# Patient Record
Sex: Female | Born: 1935 | Race: Black or African American | Hispanic: No | Smoking: Never smoker
Health system: Southern US, Community
[De-identification: ages and names within clinical notes are randomized; demographics above are authoritative.]

## PROBLEM LIST (undated history)

## (undated) DIAGNOSIS — M810 Age-related osteoporosis without current pathological fracture: Secondary | ICD-10-CM

## (undated) DIAGNOSIS — M1990 Unspecified osteoarthritis, unspecified site: Secondary | ICD-10-CM

## (undated) DIAGNOSIS — N1831 Chronic kidney disease, stage 3a: Secondary | ICD-10-CM

## (undated) DIAGNOSIS — I2510 Atherosclerotic heart disease of native coronary artery without angina pectoris: Secondary | ICD-10-CM

## (undated) DIAGNOSIS — I071 Rheumatic tricuspid insufficiency: Secondary | ICD-10-CM

## (undated) DIAGNOSIS — H269 Unspecified cataract: Secondary | ICD-10-CM

## (undated) DIAGNOSIS — I272 Pulmonary hypertension, unspecified: Secondary | ICD-10-CM

## (undated) DIAGNOSIS — J189 Pneumonia, unspecified organism: Secondary | ICD-10-CM

## (undated) DIAGNOSIS — K219 Gastro-esophageal reflux disease without esophagitis: Secondary | ICD-10-CM

## (undated) DIAGNOSIS — J309 Allergic rhinitis, unspecified: Secondary | ICD-10-CM

## (undated) DIAGNOSIS — I350 Nonrheumatic aortic (valve) stenosis: Secondary | ICD-10-CM

## (undated) DIAGNOSIS — R0600 Dyspnea, unspecified: Secondary | ICD-10-CM

## (undated) DIAGNOSIS — R7303 Prediabetes: Secondary | ICD-10-CM

## (undated) DIAGNOSIS — I340 Nonrheumatic mitral (valve) insufficiency: Secondary | ICD-10-CM

## (undated) DIAGNOSIS — I739 Peripheral vascular disease, unspecified: Secondary | ICD-10-CM

## (undated) DIAGNOSIS — I77810 Thoracic aortic ectasia: Secondary | ICD-10-CM

## (undated) DIAGNOSIS — E7800 Pure hypercholesterolemia, unspecified: Secondary | ICD-10-CM

## (undated) DIAGNOSIS — I70213 Atherosclerosis of native arteries of extremities with intermittent claudication, bilateral legs: Secondary | ICD-10-CM

## (undated) DIAGNOSIS — J449 Chronic obstructive pulmonary disease, unspecified: Secondary | ICD-10-CM

## (undated) DIAGNOSIS — M169 Osteoarthritis of hip, unspecified: Secondary | ICD-10-CM

## (undated) DIAGNOSIS — I10 Essential (primary) hypertension: Secondary | ICD-10-CM

## (undated) DIAGNOSIS — D573 Sickle-cell trait: Secondary | ICD-10-CM

## (undated) DIAGNOSIS — D571 Sickle-cell disease without crisis: Secondary | ICD-10-CM

## (undated) DIAGNOSIS — E119 Type 2 diabetes mellitus without complications: Secondary | ICD-10-CM

## (undated) DIAGNOSIS — E1121 Type 2 diabetes mellitus with diabetic nephropathy: Secondary | ICD-10-CM

## (undated) DIAGNOSIS — R519 Headache, unspecified: Secondary | ICD-10-CM

## (undated) DIAGNOSIS — J45909 Unspecified asthma, uncomplicated: Secondary | ICD-10-CM

## (undated) DIAGNOSIS — H35039 Hypertensive retinopathy, unspecified eye: Secondary | ICD-10-CM

## (undated) HISTORY — DX: Age-related osteoporosis without current pathological fracture: M81.0

## (undated) HISTORY — DX: Pure hypercholesterolemia, unspecified: E78.00

## (undated) HISTORY — PX: CATARACT EXTRACTION: SUR2

## (undated) HISTORY — DX: Atherosclerotic heart disease of native coronary artery without angina pectoris: I25.10

## (undated) HISTORY — DX: Type 2 diabetes mellitus without complications: E11.9

## (undated) HISTORY — DX: Chronic kidney disease, stage 3a: N18.31

## (undated) HISTORY — DX: Hypertensive retinopathy, unspecified eye: H35.039

## (undated) HISTORY — DX: Unspecified asthma, uncomplicated: J45.909

## (undated) HISTORY — DX: Nonrheumatic mitral (valve) insufficiency: I34.0

## (undated) HISTORY — DX: Pulmonary hypertension, unspecified: I27.20

## (undated) HISTORY — DX: Atherosclerosis of native arteries of extremities with intermittent claudication, bilateral legs: I70.213

## (undated) HISTORY — DX: Type 2 diabetes mellitus with diabetic nephropathy: E11.21

## (undated) HISTORY — PX: ELBOW SURGERY: SHX618

## (undated) HISTORY — DX: Essential (primary) hypertension: I10

## (undated) HISTORY — DX: Unspecified osteoarthritis, unspecified site: M19.90

## (undated) HISTORY — PX: TUBAL LIGATION: SHX77

## (undated) HISTORY — DX: Rheumatic tricuspid insufficiency: I07.1

## (undated) HISTORY — DX: Thoracic aortic ectasia: I77.810

## (undated) HISTORY — DX: Allergic rhinitis, unspecified: J30.9

## (undated) HISTORY — DX: Peripheral vascular disease, unspecified: I73.9

## (undated) HISTORY — DX: Nonrheumatic aortic (valve) stenosis: I35.0

## (undated) HISTORY — PX: MULTIPLE TOOTH EXTRACTIONS: SHX2053

## (undated) HISTORY — DX: Sickle-cell disease without crisis: D57.1

## (undated) HISTORY — DX: Osteoarthritis of hip, unspecified: M16.9

---

## 1999-01-20 ENCOUNTER — Ambulatory Visit (HOSPITAL_COMMUNITY): Admission: RE | Admit: 1999-01-20 | Discharge: 1999-01-20 | Payer: Self-pay | Admitting: *Deleted

## 1999-09-01 ENCOUNTER — Encounter: Payer: Self-pay | Admitting: Emergency Medicine

## 1999-09-01 ENCOUNTER — Emergency Department (HOSPITAL_COMMUNITY): Admission: EM | Admit: 1999-09-01 | Discharge: 1999-09-01 | Payer: Self-pay | Admitting: Emergency Medicine

## 2000-07-19 ENCOUNTER — Encounter: Payer: Self-pay | Admitting: Emergency Medicine

## 2000-07-19 ENCOUNTER — Emergency Department (HOSPITAL_COMMUNITY): Admission: EM | Admit: 2000-07-19 | Discharge: 2000-07-19 | Payer: Self-pay | Admitting: Emergency Medicine

## 2000-12-21 HISTORY — PX: ANKLE SURGERY: SHX546

## 2001-01-13 ENCOUNTER — Other Ambulatory Visit: Admission: RE | Admit: 2001-01-13 | Discharge: 2001-01-13 | Payer: Self-pay | Admitting: Family Medicine

## 2001-02-11 ENCOUNTER — Encounter: Payer: Self-pay | Admitting: Orthopedic Surgery

## 2001-02-11 ENCOUNTER — Inpatient Hospital Stay (HOSPITAL_COMMUNITY): Admission: EM | Admit: 2001-02-11 | Discharge: 2001-02-13 | Payer: Self-pay | Admitting: Emergency Medicine

## 2001-02-11 ENCOUNTER — Encounter: Payer: Self-pay | Admitting: Emergency Medicine

## 2001-04-01 ENCOUNTER — Ambulatory Visit (HOSPITAL_COMMUNITY): Admission: RE | Admit: 2001-04-01 | Discharge: 2001-04-01 | Payer: Self-pay | Admitting: Pulmonary Disease

## 2001-04-01 ENCOUNTER — Encounter: Payer: Self-pay | Admitting: Pulmonary Disease

## 2001-09-21 ENCOUNTER — Encounter: Payer: Self-pay | Admitting: Family Medicine

## 2001-09-21 ENCOUNTER — Encounter: Admission: RE | Admit: 2001-09-21 | Discharge: 2001-09-21 | Payer: Self-pay | Admitting: Family Medicine

## 2001-11-25 ENCOUNTER — Emergency Department (HOSPITAL_COMMUNITY): Admission: EM | Admit: 2001-11-25 | Discharge: 2001-11-25 | Payer: Self-pay | Admitting: Emergency Medicine

## 2001-11-28 ENCOUNTER — Emergency Department (HOSPITAL_COMMUNITY): Admission: EM | Admit: 2001-11-28 | Discharge: 2001-11-28 | Payer: Self-pay | Admitting: *Deleted

## 2001-12-01 ENCOUNTER — Ambulatory Visit (HOSPITAL_COMMUNITY): Admission: RE | Admit: 2001-12-01 | Discharge: 2001-12-02 | Payer: Self-pay | Admitting: Orthopedic Surgery

## 2001-12-01 ENCOUNTER — Encounter: Payer: Self-pay | Admitting: Orthopedic Surgery

## 2002-09-11 ENCOUNTER — Emergency Department (HOSPITAL_COMMUNITY): Admission: EM | Admit: 2002-09-11 | Discharge: 2002-09-11 | Payer: Self-pay | Admitting: Emergency Medicine

## 2002-09-11 ENCOUNTER — Encounter: Payer: Self-pay | Admitting: Emergency Medicine

## 2002-11-24 ENCOUNTER — Encounter: Admission: RE | Admit: 2002-11-24 | Discharge: 2002-11-24 | Payer: Self-pay | Admitting: Family Medicine

## 2002-11-24 ENCOUNTER — Encounter: Payer: Self-pay | Admitting: Family Medicine

## 2004-04-23 ENCOUNTER — Encounter: Admission: RE | Admit: 2004-04-23 | Discharge: 2004-04-23 | Payer: Self-pay | Admitting: Family Medicine

## 2004-06-02 ENCOUNTER — Ambulatory Visit (HOSPITAL_COMMUNITY): Admission: RE | Admit: 2004-06-02 | Discharge: 2004-06-02 | Payer: Self-pay | Admitting: Obstetrics & Gynecology

## 2004-11-14 ENCOUNTER — Encounter: Admission: RE | Admit: 2004-11-14 | Discharge: 2004-11-14 | Payer: Self-pay | Admitting: Family Medicine

## 2005-02-17 ENCOUNTER — Other Ambulatory Visit: Admission: RE | Admit: 2005-02-17 | Discharge: 2005-02-17 | Payer: Self-pay | Admitting: Obstetrics and Gynecology

## 2005-04-10 ENCOUNTER — Ambulatory Visit: Payer: Self-pay | Admitting: Pulmonary Disease

## 2005-10-12 ENCOUNTER — Ambulatory Visit: Payer: Self-pay | Admitting: Pulmonary Disease

## 2005-10-23 ENCOUNTER — Encounter: Admission: RE | Admit: 2005-10-23 | Discharge: 2005-10-23 | Payer: Self-pay | Admitting: Family Medicine

## 2006-04-09 ENCOUNTER — Ambulatory Visit: Payer: Self-pay | Admitting: Pulmonary Disease

## 2006-08-24 ENCOUNTER — Encounter: Payer: Self-pay | Admitting: Family Medicine

## 2006-08-24 ENCOUNTER — Encounter: Admission: RE | Admit: 2006-08-24 | Discharge: 2006-08-24 | Payer: Self-pay | Admitting: Family Medicine

## 2007-08-29 ENCOUNTER — Encounter: Admission: RE | Admit: 2007-08-29 | Discharge: 2007-08-29 | Payer: Self-pay | Admitting: Family Medicine

## 2008-06-07 ENCOUNTER — Emergency Department (HOSPITAL_COMMUNITY): Admission: EM | Admit: 2008-06-07 | Discharge: 2008-06-07 | Payer: Self-pay | Admitting: Emergency Medicine

## 2008-08-29 ENCOUNTER — Encounter: Admission: RE | Admit: 2008-08-29 | Discharge: 2008-08-29 | Payer: Self-pay | Admitting: Family Medicine

## 2009-06-03 ENCOUNTER — Other Ambulatory Visit: Admission: RE | Admit: 2009-06-03 | Discharge: 2009-06-03 | Payer: Self-pay | Admitting: Family Medicine

## 2011-01-10 ENCOUNTER — Encounter: Payer: Self-pay | Admitting: Family Medicine

## 2011-05-08 NOTE — Op Note (Signed)
NAME:  Mary Chase, Mary Chase                        ACCOUNT NO.:  [id number]   MEDICAL RECORD NO.:  [id number]                   PATIENT TYPE:  AMB   LOCATION:  SDC                                  FACILITY:  [facility]   PHYSICIAN:  Genia Del, M.D.             DATE OF BIRTH:  05/03/1935   DATE OF PROCEDURE:  06/02/2004  DATE OF DISCHARGE:                                 OPERATIVE REPORT   PREOPERATIVE DIAGNOSIS:  Intrauterine lesions with pelvic pain.   POSTOPERATIVE DIAGNOSES:  1. Intrauterine lesions with pelvic pain.  2. 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  3. Mild hematometra.  4. Uterine perforation.   SURGEON:  Genia Del, M.D.   ANESTHESIOLOGIST:  Burnett Corrente, M.D.   PROCEDURES:  1. Dilatation of the cervix.  2. Hysteroscopy.  3. Diagnostic laparoscopy opened with cauterization of uterine perforation     site for hemostasis.   DESCRIPTION OF PROCEDURE:  Under general anesthesia with endotracheal  intubation, the patient is in the lithotomy position for operative  hysteroscopy. She is prepped on the suprapubic, vulvar and vaginal areas and  draped as usual.  The bladder is catheterized.  The vaginal examination  reveals an anteverted uterus, mobile, normal volume, no adnexal mass.  The  speculum is inserted in the vagina.  The anterior lip of the cervix is  grasped with the tenaculum.  A paracervical block is done with Nesacaine 1%,  a total of 20 mL, at 4 o'clock and 8 o'clock.  We then precede with  dilatation of the cervix using Hegar dilators up to #35 without difficulty.  At the beginning of dilatation, it is noted that hematometria was probably  present about 25 mL of old blood is evacuated from the intrauterine cavity.  After dilating easily to Hegar #35, we then insert the operative  hysteroscope in the intrauterine cavity and visualize the intrauterine  cavity.  Small submucosal myomas are noted.  No other lesion is seen but a  perforation is rapidly  diagnosed on the right anterior aspect of the uterine  cavity.  Bowel loops are seen through that perforation.  Pictures are taken.  The decision is taken promptly to remove all instruments vaginally and  proceed with diagnostic laparoscopy.  The patient is repositioned.  The prep  is redone with Hibiclens over the abdominal area and the suprapubic area.  The patient is redraped and a bladder catheter with a bag is put in place.  We make an infraumbilical incision with the scalpel over 2 cm.  The  aponeurosis is grasped with Allis clamps and the aponeurosis is opened with  Mayo scissors under direct vision.  The parietal peritoneum is opened as  well with Mayo scissors and a pursestring suture of 0 Vicryl is put at the  aponeurosis.  We then insert the Hasson trocar with the laparoscope at that  level.  The pneumoperitoneum is created.  The abdominal pelvic cavity is  inspected.  Hemoperitoneum is present with about 50 mL and the fluid from  the hysteroscope procedure.  We note the right anterior perforation on the  uterus which is bleeding mildly.  A 5 mm trocar is inserted on the left  iliac area.  A 5 mm incision is done with the scalpel.  The trocar is  inserted under direct vision and the probe is put in place at that level.  Inspection is done showing both tubes, status post bilateral tubal  sterilization.  Both ovaries are normal in size and appearance.  The uterus  is normal size and appearance otherwise.  No bowel trauma is seen.  The  appendix and the liver are normal to inspection and pictures are taken.  We  used the Nezhat to suction the fluid and mild hemoperitoneum.  We used the  bipolar to cauterize the uterine perforation site to complete hemostasis at  that level.  This is done adequately.  Pictures are taken of the cauterized  uterine perforation site with good hemostasis.  We irrigate and suction the  abdominal pelvic cavity.  Hemostasis is adequate.  Instruments are  removed.  The CO2 is evacuated.  The trocars are removed and the 0 Vicryl is attached  at the aponeurosis at the infraumbilical incision.  We then use 4-0 Monocryl  in a subcuticular suture to close the skin at the infraumbilical incision  and Dermabond at the left iliac incision.  Hemostasis is adequate at all  levels.  The estimated blood loss was 50 to 75 mL.  The deficit from  hysteroscopy was 600 mL but it was suctioned by laparoscopy.  No other  complications occurred and the patient was brought to the recovery room in  stable condition.                                               Genia Del, M.D.    ML/MEDQ  D:  06/02/2004  T:  06/02/2004  Job:  [id number]
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
## 2013-08-09 ENCOUNTER — Other Ambulatory Visit: Payer: Self-pay

## 2013-08-17 ENCOUNTER — Other Ambulatory Visit: Payer: Self-pay | Admitting: Family Medicine

## 2013-08-17 DIAGNOSIS — N644 Mastodynia: Secondary | ICD-10-CM

## 2013-09-06 ENCOUNTER — Ambulatory Visit
Admission: RE | Admit: 2013-09-06 | Discharge: 2013-09-06 | Disposition: A | Discharge status: Home / Self Care | Payer: Medicare Other | Source: Ambulatory Visit | Attending: Family Medicine | Admitting: Family Medicine

## 2013-09-06 ENCOUNTER — Other Ambulatory Visit: Payer: Self-pay | Admitting: Family Medicine

## 2013-09-06 DIAGNOSIS — N6310 Unspecified lump in the right breast, unspecified quadrant: Secondary | ICD-10-CM

## 2013-09-06 DIAGNOSIS — N644 Mastodynia: Secondary | ICD-10-CM

## 2013-09-08 ENCOUNTER — Ambulatory Visit
Admission: RE | Admit: 2013-09-08 | Discharge: 2013-09-08 | Disposition: A | Discharge status: Home / Self Care | Payer: Medicare Other | Source: Ambulatory Visit | Attending: Family Medicine | Admitting: Family Medicine

## 2013-09-08 DIAGNOSIS — N6310 Unspecified lump in the right breast, unspecified quadrant: Secondary | ICD-10-CM

## 2013-09-18 ENCOUNTER — Encounter (INDEPENDENT_AMBULATORY_CARE_PROVIDER_SITE_OTHER): Payer: Self-pay | Admitting: General Surgery

## 2013-09-18 ENCOUNTER — Ambulatory Visit (INDEPENDENT_AMBULATORY_CARE_PROVIDER_SITE_OTHER): Payer: Medicare Other | Admitting: General Surgery

## 2013-09-18 ENCOUNTER — Other Ambulatory Visit (INDEPENDENT_AMBULATORY_CARE_PROVIDER_SITE_OTHER): Payer: Self-pay | Admitting: General Surgery

## 2013-09-18 VITALS — BP 123/80 | HR 74 | Temp 97.1°F | Resp 16 | Ht 60.0 in | Wt 171.0 lb

## 2013-09-18 DIAGNOSIS — N63 Unspecified lump in unspecified breast: Secondary | ICD-10-CM

## 2013-09-18 DIAGNOSIS — N61 Mastitis without abscess: Secondary | ICD-10-CM

## 2013-09-18 DIAGNOSIS — N6310 Unspecified lump in the right breast, unspecified quadrant: Secondary | ICD-10-CM

## 2013-09-18 DIAGNOSIS — C50911 Malignant neoplasm of unspecified site of right female breast: Secondary | ICD-10-CM
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
## 2013-09-20 ENCOUNTER — Telehealth (INDEPENDENT_AMBULATORY_CARE_PROVIDER_SITE_OTHER): Payer: Self-pay

## 2013-09-20 NOTE — Telephone Encounter (Signed)
Called pt to notify her that we did receive the pathology report which was benign per Dr Dwain Sarna but this might be infection still. I advised pt that she needed to still keep her appt with Dr Dwain Sarna for Monday and the pt understands.

## 2013-09-25 ENCOUNTER — Telehealth (INDEPENDENT_AMBULATORY_CARE_PROVIDER_SITE_OTHER): Payer: Self-pay

## 2013-09-25 ENCOUNTER — Ambulatory Visit (INDEPENDENT_AMBULATORY_CARE_PROVIDER_SITE_OTHER): Payer: Medicare Other | Admitting: General Surgery

## 2013-09-25 ENCOUNTER — Encounter (INDEPENDENT_AMBULATORY_CARE_PROVIDER_SITE_OTHER): Payer: Self-pay | Admitting: General Surgery

## 2013-09-25 VITALS — BP 124/82 | HR 92 | Temp 97.7°F | Resp 15 | Ht 60.0 in | Wt 171.0 lb

## 2013-09-25 DIAGNOSIS — N63 Unspecified lump in unspecified breast: Secondary | ICD-10-CM

## 2013-09-25 MED ORDER — DOXYCYCLINE HYCLATE 100 MG PO TABS: 100.0000 mg | ORAL_TABLET | Freq: Two times a day (BID) | ORAL | Status: DC

## 2013-09-25 NOTE — Telephone Encounter (Signed)
Called to see the name of the last antibiotic the pt was prescribed from their office which was Doxycycline 100mg  ordered on 8/26. The pt is needing more antibiotic and the pt wanted another refill on this script but the pt couldn't remember the name so we called to find out. I will notify Dr Dwain Sarna so he can prescribe if he wishes to keep her on the same script.

## 2013-09-25 NOTE — Telephone Encounter (Signed)
Patient is on Doxy. At this time
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
## 2013-10-09 ENCOUNTER — Encounter (INDEPENDENT_AMBULATORY_CARE_PROVIDER_SITE_OTHER): Payer: Self-pay | Admitting: General Surgery

## 2013-10-09 ENCOUNTER — Ambulatory Visit (INDEPENDENT_AMBULATORY_CARE_PROVIDER_SITE_OTHER): Payer: Medicare Other | Admitting: General Surgery

## 2013-10-09 VITALS — BP 120/72 | HR 96 | Temp 98.2°F | Resp 15 | Ht 60.0 in | Wt 169.2 lb

## 2013-10-09 DIAGNOSIS — N63 Unspecified lump in unspecified breast: Secondary | ICD-10-CM
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
## 2013-10-20 ENCOUNTER — Encounter (HOSPITAL_COMMUNITY): Payer: Self-pay | Admitting: Pharmacy Technician

## 2013-10-23 NOTE — Pre-Procedure Instructions (Addendum)
Mary Chase  10/23/2013   Your procedure is scheduled on:  Wednesday, November 12th  Report to Main Entrance "A" and check in with admitting at 0630 AM.  Call this number if you have problems the morning of surgery: [phone]   Remember:   Do not eat food or drink liquids after midnight.   Take these medicines the morning of surgery with A SIP OF WATER: advair, singulair, albuterol if needed   Do not wear jewelry, make-up or nail polish.  Do not wear lotions, powders, or perfume, deodorant.  Do not shave 48 hours prior to surgery. Men may shave face and neck.  Do not bring valuables to the hospital.  [facility] is not responsible for any belongings or valuables.               Contacts, dentures or bridgework may not be worn into surgery.  Leave suitcase in the car. After surgery it may be brought to your room.  For patients admitted to the hospital, discharge time is determined by your   treatment team.               Patients discharged the day of surgery will not be allowed to drive home.    Special Instructions: Shower using CHG 2 nights before surgery and the night before surgery.  If you shower the day of surgery use CHG.  Use special wash - you have one bottle of CHG for all showers.  You should use approximately 1/3 of the bottle for each shower.   Please read over the following fact sheets that you were given: Pain Booklet, Coughing and Deep Breathing and Surgical Site Infection Prevention

## 2013-10-24 ENCOUNTER — Other Ambulatory Visit (HOSPITAL_COMMUNITY): Payer: Self-pay | Admitting: *Deleted

## 2013-10-24 ENCOUNTER — Encounter (HOSPITAL_COMMUNITY): Payer: Self-pay

## 2013-10-24 ENCOUNTER — Encounter (HOSPITAL_COMMUNITY)
Admission: RE | Admit: 2013-10-24 | Discharge: 2013-10-24 | Disposition: A | Discharge status: Home / Self Care | Payer: Medicare Other | Source: Ambulatory Visit | Attending: General Surgery | Admitting: General Surgery

## 2013-10-24 ENCOUNTER — Encounter (HOSPITAL_COMMUNITY)
Admission: RE | Admit: 2013-10-24 | Discharge: 2013-10-24 | Disposition: A | Discharge status: Home / Self Care | Payer: Medicare Other | Source: Ambulatory Visit | Attending: Anesthesiology | Admitting: Anesthesiology

## 2013-10-24 DIAGNOSIS — Z01818 Encounter for other preprocedural examination: Secondary | ICD-10-CM | POA: Insufficient documentation

## 2013-10-24 DIAGNOSIS — Z01812 Encounter for preprocedural laboratory examination: Secondary | ICD-10-CM | POA: Insufficient documentation

## 2013-10-24 DIAGNOSIS — Z01810 Encounter for preprocedural cardiovascular examination: Secondary | ICD-10-CM | POA: Insufficient documentation

## 2013-10-24 HISTORY — DX: Gastro-esophageal reflux disease without esophagitis: K21.9

## 2013-10-24 LAB — CBC WITH DIFFERENTIAL/PLATELET
Basophils Relative: 1 % (ref 0–1)
Eosinophils Absolute: 0.5 10*3/uL (ref 0.0–0.7)
Eosinophils Relative: 5 % (ref 0–5)
HCT: 35.5 % — ABNORMAL LOW (ref 36.0–46.0)
Hemoglobin: 12.4 g/dL (ref 12.0–15.0)
MCH: 31.7 pg (ref 26.0–34.0)
MCHC: 34.9 g/dL (ref 30.0–36.0)
MCV: 90.8 fL (ref 78.0–100.0)
Monocytes Absolute: 0.4 10*3/uL (ref 0.1–1.0)
Monocytes Relative: 4 % (ref 3–12)
Neutro Abs: 5.2 10*3/uL (ref 1.7–7.7)

## 2013-10-24 LAB — BASIC METABOLIC PANEL
BUN: 13 mg/dL (ref 6–23)
Chloride: 105 mEq/L (ref 96–112)
Creatinine, Ser: 1.05 mg/dL (ref 0.50–1.10)
GFR calc Af Amer: 59 mL/min — ABNORMAL LOW (ref >90)
GFR calc non Af Amer: 51 mL/min — ABNORMAL LOW (ref >90)
Glucose, Bld: 110 mg/dL — ABNORMAL HIGH (ref 70–99)
Potassium: 3.4 mEq/L — ABNORMAL LOW (ref 3.5–5.1)

## 2013-10-24 NOTE — Progress Notes (Signed)
req'd prior ekg and office notes frm pcp dr [facility]

## 2013-10-26 NOTE — Progress Notes (Signed)
Anesthesia chart review:  Patient is a 78 year old female scheduled for excision of right breast mass on 11/01/13 by Dr. Dwain Sarna.  She has a right breast mass and malignancy needs to be ruled out.  History includes non-smoker, pre-diabetic (no meds), HTN, asthma, GERD, arthritis, obesity, sickle cell trait. PCP is listed as Dr. Marinda Elk.    EKG on 10/24/13 showed NSR, septal infarct (age undetermined) with negative T waves in V1-2. The interpreting cardiologist did not feel that her EKG was significantly changed when compared to her last tracing on 05/29/04. No CV symptoms were documented at her PAT visit or at her last visit with Dr. Dwain Sarna.   CXR on 10/24/13 showed: Probable linear scarring or atelectasis at both lung bases. No definite active process. Mild cardiomegaly.   Preoperative labs noted.  Patient will be evaluated by her assigned anesthesiologist on the day of surgery.  If no new changes/CV symptoms then I would anticipate that she could proceed as planned.  Velna Ochs [facility] Short Stay Center/Anesthesiology Phone [phone] 10/26/2013 11:37 AM

## 2013-10-31 MED ORDER — VANCOMYCIN HCL IN DEXTROSE 1-5 GM/200ML-% IV SOLN
1000.0000 mg | INTRAVENOUS | Status: AC
  Administered 2013-11-01: 1000 mg via INTRAVENOUS
  Filled 2013-10-31: qty 200

## 2013-11-01 ENCOUNTER — Ambulatory Visit (HOSPITAL_COMMUNITY): Payer: Medicare Other | Admitting: Anesthesiology

## 2013-11-01 ENCOUNTER — Ambulatory Visit (HOSPITAL_BASED_OUTPATIENT_CLINIC_OR_DEPARTMENT_OTHER): Admit: 2013-11-01 | Payer: Medicare Other | Admitting: General Surgery

## 2013-11-01 ENCOUNTER — Encounter (HOSPITAL_COMMUNITY)
Admission: RE | Disposition: A | Discharge status: Home / Self Care | Payer: Self-pay | Source: Ambulatory Visit | Attending: General Surgery

## 2013-11-01 ENCOUNTER — Encounter (HOSPITAL_BASED_OUTPATIENT_CLINIC_OR_DEPARTMENT_OTHER): Payer: Self-pay

## 2013-11-01 ENCOUNTER — Encounter (HOSPITAL_COMMUNITY): Payer: Medicare Other | Admitting: Vascular Surgery

## 2013-11-01 ENCOUNTER — Ambulatory Visit (HOSPITAL_COMMUNITY)
Admission: RE | Admit: 2013-11-01 | Discharge: 2013-11-01 | Disposition: A | Discharge status: Home / Self Care | Payer: Medicare Other | Source: Ambulatory Visit | Attending: General Surgery | Admitting: General Surgery

## 2013-11-01 ENCOUNTER — Encounter (HOSPITAL_COMMUNITY): Payer: Self-pay | Admitting: Surgery

## 2013-11-01 DIAGNOSIS — D571 Sickle-cell disease without crisis: Secondary | ICD-10-CM | POA: Insufficient documentation

## 2013-11-01 DIAGNOSIS — N6009 Solitary cyst of unspecified breast: Secondary | ICD-10-CM

## 2013-11-01 DIAGNOSIS — N641 Fat necrosis of breast: Secondary | ICD-10-CM

## 2013-11-01 DIAGNOSIS — I10 Essential (primary) hypertension: Secondary | ICD-10-CM | POA: Insufficient documentation

## 2013-11-01 DIAGNOSIS — E119 Type 2 diabetes mellitus without complications: Secondary | ICD-10-CM | POA: Insufficient documentation

## 2013-11-01 DIAGNOSIS — N6019 Diffuse cystic mastopathy of unspecified breast: Secondary | ICD-10-CM | POA: Insufficient documentation

## 2013-11-01 HISTORY — PX: BREAST BIOPSY: SHX20

## 2013-11-01 LAB — GLUCOSE, CAPILLARY

## 2013-11-01 SURGERY — BREAST BIOPSY
Anesthesia: General | Laterality: Right | Wound class: Dirty or Infected

## 2013-11-01 SURGERY — BREAST BIOPSY
Anesthesia: General | Laterality: Right

## 2013-11-01 MED ORDER — OXYCODONE HCL 5 MG PO TABS
ORAL_TABLET | ORAL | Status: AC
  Filled 2013-11-01: qty 5

## 2013-11-01 MED ORDER — LIDOCAINE HCL (CARDIAC) 20 MG/ML IV SOLN
INTRAVENOUS | Status: DC | PRN
  Administered 2013-11-01: 100 mg via INTRAVENOUS

## 2013-11-01 MED ORDER — FENTANYL CITRATE 0.05 MG/ML IJ SOLN
INTRAMUSCULAR | Status: DC | PRN
  Administered 2013-11-01: 50 ug via INTRAVENOUS
  Administered 2013-11-01: 100 ug via INTRAVENOUS

## 2013-11-01 MED ORDER — BACITRACIN ZINC 500 UNIT/GM EX OINT
TOPICAL_OINTMENT | CUTANEOUS | Status: AC
  Filled 2013-11-01: qty 15

## 2013-11-01 MED ORDER — OXYCODONE-ACETAMINOPHEN 10-325 MG PO TABS: 1.0000 | ORAL_TABLET | Freq: Four times a day (QID) | ORAL | Status: DC | PRN

## 2013-11-01 MED ORDER — PROMETHAZINE HCL 25 MG/ML IJ SOLN: 6.2500 mg | INTRAMUSCULAR | Status: DC | PRN

## 2013-11-01 MED ORDER — LACTATED RINGERS IV SOLN
INTRAVENOUS | Status: DC | PRN
  Administered 2013-11-01: 08:00:00 via INTRAVENOUS

## 2013-11-01 MED ORDER — OXYCODONE HCL 5 MG/5ML PO SOLN: 5.0000 mg | Freq: Once | ORAL | Status: AC | PRN

## 2013-11-01 MED ORDER — ARTIFICIAL TEARS OP OINT
TOPICAL_OINTMENT | OPHTHALMIC | Status: DC | PRN
  Administered 2013-11-01: 1 via OPHTHALMIC

## 2013-11-01 MED ORDER — OXYCODONE HCL 5 MG PO TABS
5.0000 mg | ORAL_TABLET | Freq: Once | ORAL | Status: AC | PRN
  Administered 2013-11-01: 5 mg via ORAL

## 2013-11-01 MED ORDER — ALBUTEROL SULFATE HFA 108 (90 BASE) MCG/ACT IN AERS
INHALATION_SPRAY | RESPIRATORY_TRACT | Status: DC | PRN
  Administered 2013-11-01 (×3): 2 via RESPIRATORY_TRACT

## 2013-11-01 MED ORDER — BUPIVACAINE-EPINEPHRINE 0.25% -1:200000 IJ SOLN
INTRAMUSCULAR | Status: DC | PRN
  Administered 2013-11-01: 20 mL

## 2013-11-01 MED ORDER — BUPIVACAINE-EPINEPHRINE PF 0.25-1:200000 % IJ SOLN
INTRAMUSCULAR | Status: AC
  Filled 2013-11-01: qty 30

## 2013-11-01 MED ORDER — HYDROMORPHONE HCL PF 1 MG/ML IJ SOLN
INTRAMUSCULAR | Status: AC
  Filled 2013-11-01: qty 1

## 2013-11-01 MED ORDER — ONDANSETRON HCL 4 MG/2ML IJ SOLN
INTRAMUSCULAR | Status: DC | PRN
  Administered 2013-11-01: 4 mg via INTRAVENOUS

## 2013-11-01 MED ORDER — HYDROMORPHONE HCL PF 1 MG/ML IJ SOLN
0.2500 mg | INTRAMUSCULAR | Status: DC | PRN
  Administered 2013-11-01 (×2): 0.5 mg via INTRAVENOUS

## 2013-11-01 MED ORDER — MORPHINE SULFATE 2 MG/ML IJ SOLN: 2.0000 mg | INTRAMUSCULAR | Status: DC | PRN

## 2013-11-01 MED ORDER — OXYCODONE HCL 5 MG PO TABS: 5.0000 mg | ORAL_TABLET | ORAL | Status: DC | PRN

## 2013-11-01 MED ORDER — 0.9 % SODIUM CHLORIDE (POUR BTL) OPTIME
TOPICAL | Status: DC | PRN
  Administered 2013-11-01: 1000 mL

## 2013-11-01 MED ORDER — PROPOFOL 10 MG/ML IV BOLUS
INTRAVENOUS | Status: DC | PRN
  Administered 2013-11-01: 100 mg via INTRAVENOUS

## 2013-11-01 MED ORDER — DOXYCYCLINE HYCLATE 100 MG PO TABS: 100.0000 mg | ORAL_TABLET | Freq: Two times a day (BID) | ORAL | Status: DC

## 2013-11-01 SURGICAL SUPPLY — 49 items
ADH SKN CLS APL DERMABOND .7 (GAUZE/BANDAGES/DRESSINGS) ×1
APPLIER CLIP 9.375 MED OPEN (MISCELLANEOUS)
APR CLP MED 9.3 20 MLT OPN (MISCELLANEOUS)
BINDER BREAST LRG (GAUZE/BANDAGES/DRESSINGS) IMPLANT
BINDER BREAST XLRG (GAUZE/BANDAGES/DRESSINGS) ×1 IMPLANT
BLADE SURG 15 STRL LF DISP TIS (BLADE) ×1 IMPLANT
BLADE SURG 15 STRL SS (BLADE) ×2
BLADE SURG ROTATE 9660 (MISCELLANEOUS) IMPLANT
CANISTER SUCTION 2500CC (MISCELLANEOUS) IMPLANT
CHLORAPREP W/TINT 26ML (MISCELLANEOUS) ×2 IMPLANT
CLIP APPLIE 9.375 MED OPEN (MISCELLANEOUS) IMPLANT
CONT SPEC STER OR (MISCELLANEOUS) IMPLANT
COVER SURGICAL LIGHT HANDLE (MISCELLANEOUS) ×2 IMPLANT
DECANTER SPIKE VIAL GLASS SM (MISCELLANEOUS) ×2 IMPLANT
DERMABOND ADVANCED (GAUZE/BANDAGES/DRESSINGS) ×1
DERMABOND ADVANCED .7 DNX12 (GAUZE/BANDAGES/DRESSINGS) ×1 IMPLANT
DRAPE CHEST BREAST 15X10 FENES (DRAPES) ×2 IMPLANT
DRSG PAD ABDOMINAL 8X10 ST (GAUZE/BANDAGES/DRESSINGS) ×1 IMPLANT
ELECT CAUTERY BLADE 6.4 (BLADE) ×2 IMPLANT
ELECT REM PT RETURN 9FT ADLT (ELECTROSURGICAL) ×2
ELECTRODE REM PT RTRN 9FT ADLT (ELECTROSURGICAL) ×1 IMPLANT
GLOVE BIO SURGEON STRL SZ7 (GLOVE) ×2 IMPLANT
GLOVE BIOGEL PI IND STRL 7.5 (GLOVE) ×1 IMPLANT
GLOVE BIOGEL PI INDICATOR 7.5 (GLOVE) ×1
GOWN STRL NON-REIN LRG LVL3 (GOWN DISPOSABLE) ×4 IMPLANT
KIT BASIN OR (CUSTOM PROCEDURE TRAY) ×2 IMPLANT
KIT ROOM TURNOVER OR (KITS) ×2 IMPLANT
NDL HYPO 25GX1X1/2 BEV (NEEDLE) ×1 IMPLANT
NEEDLE HYPO 25GX1X1/2 BEV (NEEDLE) ×2 IMPLANT
NS IRRIG 1000ML POUR BTL (IV SOLUTION) ×2 IMPLANT
PACK SURGICAL SETUP 50X90 (CUSTOM PROCEDURE TRAY) ×2 IMPLANT
PAD ARMBOARD 7.5X6 YLW CONV (MISCELLANEOUS) ×2 IMPLANT
PENCIL BUTTON HOLSTER BLD 10FT (ELECTRODE) ×2 IMPLANT
SPONGE GAUZE 4X4 12PLY (GAUZE/BANDAGES/DRESSINGS) ×1 IMPLANT
SPONGE LAP 4X18 X RAY DECT (DISPOSABLE) ×2 IMPLANT
SUT ETHILON 3 0 FSL (SUTURE) ×1 IMPLANT
SUT MNCRL AB 4-0 PS2 18 (SUTURE) ×2 IMPLANT
SUT SILK 2 0 SH (SUTURE) IMPLANT
SUT VIC AB 2-0 SH 27 (SUTURE) ×2
SUT VIC AB 2-0 SH 27X BRD (SUTURE) ×1 IMPLANT
SUT VIC AB 3-0 SH 27 (SUTURE) ×2
SUT VIC AB 3-0 SH 27XBRD (SUTURE) ×1 IMPLANT
SYR BULB 3OZ (MISCELLANEOUS) ×2 IMPLANT
SYR CONTROL 10ML LL (SYRINGE) ×2 IMPLANT
TAPE CLOTH SURG 6X10 WHT LF (GAUZE/BANDAGES/DRESSINGS) ×1 IMPLANT
TOWEL OR 17X24 6PK STRL BLUE (TOWEL DISPOSABLE) ×2 IMPLANT
TOWEL OR 17X26 10 PK STRL BLUE (TOWEL DISPOSABLE) ×2 IMPLANT
TUBE CONNECTING 12X1/4 (SUCTIONS) IMPLANT
YANKAUER SUCT BULB TIP NO VENT (SUCTIONS) IMPLANT
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
## 2013-11-01 NOTE — Anesthesia Postprocedure Evaluation (Signed)
  Anesthesia Post-op Note  Patient: Mary Chase  Procedure(s) Performed: Procedure(s): RIGHT BREAST MASS EXCISION (Right)  Patient Location: PACU  Anesthesia Type:General  Level of Consciousness: awake  Airway and Oxygen Therapy: Patient Spontanous Breathing  Post-op Pain: mild  Post-op Assessment: Post-op Vital signs reviewed  Post-op Vital Signs: stable  Complications: No apparent anesthesia complications

## 2013-11-01 NOTE — Anesthesia Preprocedure Evaluation (Signed)
Anesthesia Evaluation  Patient identified by MRN, date of birth, ID band Patient awake    Reviewed: Allergy & Precautions, H&P , NPO status , Patient's Chart, lab work & pertinent test results  History of Anesthesia Complications (+) history of anesthetic complications  Airway Mallampati: I  Neck ROM: Full    Dental   Pulmonary asthma ,  breath sounds clear to auscultation        Cardiovascular hypertension, Rhythm:Regular Rate:Normal     Neuro/Psych    GI/Hepatic GERD-  ,  Endo/Other  diabetes  Renal/GU      Musculoskeletal   Abdominal   Peds  Hematology  (+) Sickle cell trait and anemia ,   Anesthesia Other Findings   Reproductive/Obstetrics                           Anesthesia Physical Anesthesia Plan  ASA: III  Anesthesia Plan: General   Post-op Pain Management:    Induction: Intravenous  Airway Management Planned: Oral ETT  Additional Equipment:   Intra-op Plan:   Post-operative Plan: Extubation in OR  Informed Consent: I have reviewed the patients History and Physical, chart, labs and discussed the procedure including the risks, benefits and alternatives for the proposed anesthesia with the patient or authorized representative who has indicated his/her understanding and acceptance.   Dental advisory given  Plan Discussed with: CRNA and Surgeon  Anesthesia Plan Comments:         Anesthesia Quick Evaluation

## 2013-11-01 NOTE — Op Note (Signed)
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 Harden Mo Anesthesia: Gen. With LMA Specimens: Right breast tissue marked with short stitch superior, double stitch deep, long stitch lateral Complications: None Drains: None Estimated blood loss: Minimal Sponge count correct the completion Disposition to recovery stable  Indications: This a 78 year old female who has had a right breast mass with overlying skin ulceration. This has been biopsied both radiologically and a punch biopsy by me in the office which did not show any evidence of cancer. We have tried to treat this conservatively with antibiotics but this mass persists and a could not definitively rule out a carcinoma. I discussed with her going to the operating room for an excisional biopsy of this area.  Procedure: After informed consent was obtained the patient was taken to the operating room. She was given 1 g of intravenous vancomycin due to her allergies. Sequential compression devices were on her legs. She was then placed under general anesthesia with an LMA. Her right breast was prepped and draped in the standard sterile surgical fashion. A surgical timeout was then performed.  I made an elliptical incision surrounding the ulcerated skin. I then used cautery to dissect around the entire mass. This did appear to be a chronic abscess cavity and I took cultures of it. I ended up excising the entirety of this. This was clean after I had removed it. I then irrigated copiously. I marked the specimen passed off as above. I then closed the deep breast tissue with 2-0 Vicryl. I closed the dermis with some 3-0 Vicryl and I loosely closed the skin with 3-0 nylon suture. Bacitracin was placed on the wound. A sterile dressing was placed. A breast binder was placed. She tolerated this well was extubated and transferred to recovery stable.

## 2013-11-01 NOTE — Anesthesia Procedure Notes (Signed)
Procedure Name: Intubation Date/Time: 11/01/2013 8:36 AM Performed by: Sherie Don Pre-anesthesia Checklist: Patient identified, Emergency Drugs available, Suction available, Patient being monitored and Timeout performed Patient Re-evaluated:Patient Re-evaluated prior to inductionOxygen Delivery Method: Circle system utilized Preoxygenation: Pre-oxygenation with 100% oxygen Intubation Type: IV induction Laryngoscope Size: Mac and 3 Grade View: Grade I Tube type: Oral Tube size: 7.5 mm Number of attempts: 1 Airway Equipment and Method: Stylet Placement Confirmation: ETT inserted through vocal cords under direct vision,  breath sounds checked- equal and bilateral and positive ETCO2 Secured at: 23 cm Tube secured with: Tape Dental Injury: Teeth and Oropharynx as per pre-operative assessment

## 2013-11-01 NOTE — Interval H&P Note (Signed)
History and Physical Interval Note:  11/01/2013 8:28 AM  Mary Chase  has presented today for surgery, with the diagnosis of right breast mass  The various methods of treatment have been discussed with the patient and family. After consideration of risks, benefits and other options for treatment, the patient has consented to  Procedure(s): RIGHT BREAST MASS EXCISION (Right) as a surgical intervention .  The patient's history has been reviewed, patient examined, no change in status, stable for surgery.  I have reviewed the patient's chart and labs.  Questions were answered to the patient's satisfaction.     Solae Norling

## 2013-11-01 NOTE — Transfer of Care (Signed)
Immediate Anesthesia Transfer of Care Note  Patient: Mary Chase  Procedure(s) Performed: Procedure(s): RIGHT BREAST MASS EXCISION (Right)  Patient Location: PACU  Anesthesia Type:General  Level of Consciousness: awake, alert  and patient cooperative  Airway & Oxygen Therapy: Patient Spontanous Breathing and Patient connected to nasal cannula oxygen  Post-op Assessment: Report given to PACU RN, Post -op Vital signs reviewed and stable and Patient moving all extremities X 4  Post vital signs: Reviewed and stable  Complications: No apparent anesthesia complications

## 2013-11-01 NOTE — Preoperative (Signed)
Beta Blockers   Reason not to administer Beta Blockers:Not Applicable 

## 2013-11-04 LAB — CULTURE, ROUTINE-ABSCESS: Culture: NO GROWTH

## 2013-11-06 ENCOUNTER — Encounter (HOSPITAL_COMMUNITY): Payer: Self-pay | Admitting: General Surgery

## 2013-11-06 LAB — ANAEROBIC CULTURE

## 2013-11-21 ENCOUNTER — Ambulatory Visit (INDEPENDENT_AMBULATORY_CARE_PROVIDER_SITE_OTHER): Payer: Medicare Other | Admitting: General Surgery

## 2013-11-21 ENCOUNTER — Encounter (INDEPENDENT_AMBULATORY_CARE_PROVIDER_SITE_OTHER): Payer: Self-pay | Admitting: General Surgery

## 2013-11-21 VITALS — BP 122/78 | HR 76 | Resp 18 | Ht 60.0 in | Wt 171.0 lb

## 2013-11-21 DIAGNOSIS — Z09 Encounter for follow-up examination after completed treatment for conditions other than malignant neoplasm: Secondary | ICD-10-CM

## 2013-11-21 NOTE — Progress Notes (Signed)
Subjective:     Patient ID: Mary Chase, female   DOB: 09/20/1935, 78 y.o.   MRN: [id number]  HPI 10 yof who Had a right breast mass that I eventually excised. Her pathology just shows chronic inflammation with no evidence of a malignancy. I put external stitches in this due to my concern that this was an absent left partially open. She returns today doing well without any real complaints. I removed her sutures today. They're couple of small areas where the skin is separated that are healing now. We discussed the pathology today.  Review of Systems     Objective:   Physical Exam Right breast incision clean without infection, I removed sutures, she has some small areas of skin separation    Assessment:     Status post right breast mass excision     Plan:     This area is benign. I think this area should heal in the next several weeks. She is going to apply some antibiotic ointment and a he'll come back and see me in one month. I cleared her for all normal activity and showering.

## 2013-12-25 ENCOUNTER — Encounter (INDEPENDENT_AMBULATORY_CARE_PROVIDER_SITE_OTHER): Payer: Self-pay | Admitting: General Surgery

## 2013-12-25 ENCOUNTER — Ambulatory Visit (INDEPENDENT_AMBULATORY_CARE_PROVIDER_SITE_OTHER): Payer: Medicare Other | Admitting: General Surgery

## 2013-12-25 VITALS — BP 118/78 | HR 100 | Temp 98.9°F | Resp 16 | Ht 60.0 in | Wt 174.0 lb

## 2013-12-25 DIAGNOSIS — Z09 Encounter for follow-up examination after completed treatment for conditions other than malignant neoplasm: Secondary | ICD-10-CM

## 2013-12-25 NOTE — Progress Notes (Signed)
Subjective:     Patient ID: Mary Chase, female   DOB: 1935-06-10, 79 y.o.   MRN: [id number]  HPI 46 yof who had a right breast mass that I excised. Her pathology just showed chronic inflammation with no evidence of a malignancy. I have removed her stitches. She returns today doing well without any real complaints. She has some hardness at incision but otherwise well.  She has had some sob for which she is seeing her pcp right now.   Review of Systems     Objective:   Physical Exam Right breast incision with small scab on it, hardness consistent with scarring, no mass palpated     Assessment:     S/p right breast mass excision     Plan:     I think she is recovering well. This will just take a little bit more time. She can begin massaging her incision. I will see her back in a couple of months just to make sure this continues to heal.

## 2014-08-16 ENCOUNTER — Ambulatory Visit (INDEPENDENT_AMBULATORY_CARE_PROVIDER_SITE_OTHER): Payer: Medicare Other | Admitting: General Surgery

## 2014-08-16 ENCOUNTER — Encounter (INDEPENDENT_AMBULATORY_CARE_PROVIDER_SITE_OTHER): Payer: Self-pay | Admitting: General Surgery

## 2014-08-16 VITALS — BP 130/72 | HR 76 | Temp 98.3°F

## 2014-08-16 DIAGNOSIS — Z09 Encounter for follow-up examination after completed treatment for conditions other than malignant neoplasm: Secondary | ICD-10-CM

## 2014-08-16 NOTE — Progress Notes (Signed)
Subjective:     Patient ID: Mary Chase, female   DOB: 1935-07-07, 79 y.o.   MRN: [id number]  HPI 35 yof who had a right breast mass that I excised. Her pathology just showed chronic inflammation with no evidence of a malignancy.  She has some hardness at incision but otherwise well. This is really unchanged since last time.  Everything is healed at this point.  Review of Systems     Objective:   Physical Exam Right breast exam with healed incision and scar tissue underlying it as well as superior    Assessment:     S/p right breast excision     Plan:     I think this is just all scarring postop and from initial process.  She is due for mm and going to get scheduled.  I will plan on seeing her again in about 4 months to make sure this continues to improve.

## 2015-01-23 ENCOUNTER — Other Ambulatory Visit: Payer: Self-pay

## 2015-01-23 DIAGNOSIS — Z1231 Encounter for screening mammogram for malignant neoplasm of breast: Secondary | ICD-10-CM

## 2015-01-24 ENCOUNTER — Other Ambulatory Visit: Payer: Self-pay | Admitting: Physician Assistant

## 2015-01-24 DIAGNOSIS — R5381 Other malaise: Secondary | ICD-10-CM

## 2015-01-25 ENCOUNTER — Other Ambulatory Visit: Payer: Self-pay | Admitting: Physician Assistant

## 2015-01-25 DIAGNOSIS — M810 Age-related osteoporosis without current pathological fracture: Secondary | ICD-10-CM

## 2015-02-01 ENCOUNTER — Ambulatory Visit
Admission: RE | Admit: 2015-02-01 | Discharge: 2015-02-01 | Disposition: A | Discharge status: Home / Self Care | Payer: Medicare Other | Source: Ambulatory Visit | Attending: Physician Assistant | Admitting: Physician Assistant

## 2015-02-01 ENCOUNTER — Ambulatory Visit
Admission: RE | Admit: 2015-02-01 | Discharge: 2015-02-01 | Disposition: A | Discharge status: Home / Self Care | Payer: Medicare Other | Source: Ambulatory Visit

## 2015-02-01 DIAGNOSIS — M810 Age-related osteoporosis without current pathological fracture: Secondary | ICD-10-CM

## 2015-02-01 DIAGNOSIS — Z1231 Encounter for screening mammogram for malignant neoplasm of breast: Secondary | ICD-10-CM

## 2015-08-31 DIAGNOSIS — Z8709 Personal history of other diseases of the respiratory system: Secondary | ICD-10-CM | POA: Insufficient documentation

## 2015-08-31 DIAGNOSIS — J449 Chronic obstructive pulmonary disease, unspecified: Secondary | ICD-10-CM | POA: Insufficient documentation

## 2015-10-03 ENCOUNTER — Ambulatory Visit (INDEPENDENT_AMBULATORY_CARE_PROVIDER_SITE_OTHER): Payer: Medicare Other

## 2015-10-03 DIAGNOSIS — J309 Allergic rhinitis, unspecified: Secondary | ICD-10-CM | POA: Diagnosis not present

## 2015-10-31 ENCOUNTER — Ambulatory Visit (INDEPENDENT_AMBULATORY_CARE_PROVIDER_SITE_OTHER): Payer: Medicare Other

## 2015-10-31 DIAGNOSIS — J309 Allergic rhinitis, unspecified: Secondary | ICD-10-CM | POA: Diagnosis not present

## 2015-11-28 ENCOUNTER — Ambulatory Visit (INDEPENDENT_AMBULATORY_CARE_PROVIDER_SITE_OTHER): Payer: Medicare Other

## 2015-11-28 DIAGNOSIS — J309 Allergic rhinitis, unspecified: Secondary | ICD-10-CM | POA: Diagnosis not present

## 2015-12-26 ENCOUNTER — Ambulatory Visit (INDEPENDENT_AMBULATORY_CARE_PROVIDER_SITE_OTHER): Payer: Medicare Other

## 2015-12-26 DIAGNOSIS — J309 Allergic rhinitis, unspecified: Secondary | ICD-10-CM | POA: Diagnosis not present

## 2016-01-02 ENCOUNTER — Ambulatory Visit (INDEPENDENT_AMBULATORY_CARE_PROVIDER_SITE_OTHER): Payer: Medicare Other

## 2016-01-02 DIAGNOSIS — J309 Allergic rhinitis, unspecified: Secondary | ICD-10-CM | POA: Diagnosis not present

## 2016-01-09 ENCOUNTER — Ambulatory Visit (INDEPENDENT_AMBULATORY_CARE_PROVIDER_SITE_OTHER): Payer: Medicare Other

## 2016-01-09 DIAGNOSIS — J309 Allergic rhinitis, unspecified: Secondary | ICD-10-CM | POA: Diagnosis not present

## 2016-01-31 ENCOUNTER — Ambulatory Visit (INDEPENDENT_AMBULATORY_CARE_PROVIDER_SITE_OTHER): Payer: Medicare Other | Admitting: Neurology

## 2016-01-31 DIAGNOSIS — J309 Allergic rhinitis, unspecified: Secondary | ICD-10-CM | POA: Diagnosis not present

## 2016-02-12 ENCOUNTER — Ambulatory Visit (INDEPENDENT_AMBULATORY_CARE_PROVIDER_SITE_OTHER): Payer: Medicare Other

## 2016-02-12 DIAGNOSIS — J309 Allergic rhinitis, unspecified: Secondary | ICD-10-CM | POA: Diagnosis not present

## 2016-02-19 ENCOUNTER — Ambulatory Visit (INDEPENDENT_AMBULATORY_CARE_PROVIDER_SITE_OTHER): Payer: Medicare Other

## 2016-02-19 DIAGNOSIS — J309 Allergic rhinitis, unspecified: Secondary | ICD-10-CM

## 2016-03-17 ENCOUNTER — Ambulatory Visit (INDEPENDENT_AMBULATORY_CARE_PROVIDER_SITE_OTHER): Payer: Medicare Other

## 2016-03-17 DIAGNOSIS — J309 Allergic rhinitis, unspecified: Secondary | ICD-10-CM | POA: Diagnosis not present

## 2016-03-24 ENCOUNTER — Ambulatory Visit (INDEPENDENT_AMBULATORY_CARE_PROVIDER_SITE_OTHER): Payer: Medicare Other

## 2016-03-24 DIAGNOSIS — J309 Allergic rhinitis, unspecified: Secondary | ICD-10-CM

## 2016-03-31 ENCOUNTER — Ambulatory Visit (INDEPENDENT_AMBULATORY_CARE_PROVIDER_SITE_OTHER): Payer: Medicare Other | Admitting: *Deleted

## 2016-03-31 DIAGNOSIS — J309 Allergic rhinitis, unspecified: Secondary | ICD-10-CM

## 2016-04-01 DIAGNOSIS — J3089 Other allergic rhinitis: Secondary | ICD-10-CM | POA: Diagnosis not present

## 2016-04-04 DIAGNOSIS — J301 Allergic rhinitis due to pollen: Secondary | ICD-10-CM | POA: Diagnosis not present

## 2016-04-24 ENCOUNTER — Ambulatory Visit (INDEPENDENT_AMBULATORY_CARE_PROVIDER_SITE_OTHER): Payer: Medicare Other | Admitting: *Deleted

## 2016-04-24 DIAGNOSIS — J309 Allergic rhinitis, unspecified: Secondary | ICD-10-CM | POA: Diagnosis not present

## 2016-05-22 ENCOUNTER — Ambulatory Visit (INDEPENDENT_AMBULATORY_CARE_PROVIDER_SITE_OTHER): Payer: Medicare Other | Admitting: *Deleted

## 2016-05-22 DIAGNOSIS — J309 Allergic rhinitis, unspecified: Secondary | ICD-10-CM

## 2016-06-19 ENCOUNTER — Ambulatory Visit (INDEPENDENT_AMBULATORY_CARE_PROVIDER_SITE_OTHER): Payer: Medicare Other | Admitting: *Deleted

## 2016-06-19 DIAGNOSIS — J309 Allergic rhinitis, unspecified: Secondary | ICD-10-CM | POA: Diagnosis not present

## 2016-06-26 ENCOUNTER — Ambulatory Visit (INDEPENDENT_AMBULATORY_CARE_PROVIDER_SITE_OTHER): Payer: Medicare Other | Admitting: *Deleted

## 2016-06-26 DIAGNOSIS — J309 Allergic rhinitis, unspecified: Secondary | ICD-10-CM | POA: Diagnosis not present

## 2016-07-03 ENCOUNTER — Ambulatory Visit (INDEPENDENT_AMBULATORY_CARE_PROVIDER_SITE_OTHER): Payer: Medicare Other | Admitting: *Deleted

## 2016-07-03 DIAGNOSIS — J309 Allergic rhinitis, unspecified: Secondary | ICD-10-CM

## 2016-07-10 ENCOUNTER — Ambulatory Visit (INDEPENDENT_AMBULATORY_CARE_PROVIDER_SITE_OTHER): Payer: Medicare Other | Admitting: *Deleted

## 2016-07-10 DIAGNOSIS — J309 Allergic rhinitis, unspecified: Secondary | ICD-10-CM | POA: Diagnosis not present

## 2016-07-17 ENCOUNTER — Ambulatory Visit (INDEPENDENT_AMBULATORY_CARE_PROVIDER_SITE_OTHER): Payer: Medicare Other | Admitting: *Deleted

## 2016-07-17 DIAGNOSIS — J309 Allergic rhinitis, unspecified: Secondary | ICD-10-CM | POA: Diagnosis not present

## 2017-02-19 NOTE — Addendum Note (Signed)
Addended by: Felipa Emory on: 02/19/2017 02:33 PM   Modules accepted: Orders

## 2018-03-09 ENCOUNTER — Other Ambulatory Visit: Payer: Self-pay | Admitting: Internal Medicine

## 2018-03-09 DIAGNOSIS — M810 Age-related osteoporosis without current pathological fracture: Secondary | ICD-10-CM

## 2018-03-09 DIAGNOSIS — Z139 Encounter for screening, unspecified: Secondary | ICD-10-CM

## 2018-03-28 ENCOUNTER — Ambulatory Visit: Payer: Medicare Other

## 2018-03-28 ENCOUNTER — Ambulatory Visit
Admission: RE | Admit: 2018-03-28 | Discharge: 2018-03-28 | Disposition: A | Discharge status: Home / Self Care | Payer: Medicare Other | Source: Ambulatory Visit | Attending: Internal Medicine | Admitting: Internal Medicine

## 2018-03-28 DIAGNOSIS — Z139 Encounter for screening, unspecified: Secondary | ICD-10-CM

## 2018-03-30 ENCOUNTER — Other Ambulatory Visit: Payer: Medicare Other

## 2018-04-05 ENCOUNTER — Ambulatory Visit: Payer: Medicare Other

## 2018-04-05 ENCOUNTER — Ambulatory Visit
Admission: RE | Admit: 2018-04-05 | Discharge: 2018-04-05 | Disposition: A | Discharge status: Home / Self Care | Payer: Medicare Other | Source: Ambulatory Visit | Attending: Internal Medicine | Admitting: Internal Medicine

## 2018-04-05 DIAGNOSIS — M810 Age-related osteoporosis without current pathological fracture: Secondary | ICD-10-CM

## 2019-10-20 ENCOUNTER — Other Ambulatory Visit: Payer: Self-pay | Admitting: Family Medicine

## 2019-10-20 DIAGNOSIS — I739 Peripheral vascular disease, unspecified: Secondary | ICD-10-CM

## 2019-10-25 ENCOUNTER — Other Ambulatory Visit: Payer: Medicare Other

## 2019-10-31 ENCOUNTER — Ambulatory Visit
Admission: RE | Admit: 2019-10-31 | Discharge: 2019-10-31 | Disposition: A | Discharge status: Home / Self Care | Payer: Medicare Other | Source: Ambulatory Visit | Attending: Family Medicine | Admitting: Family Medicine

## 2019-10-31 DIAGNOSIS — I739 Peripheral vascular disease, unspecified: Secondary | ICD-10-CM

## 2019-11-27 ENCOUNTER — Other Ambulatory Visit: Payer: Self-pay

## 2019-11-27 ENCOUNTER — Ambulatory Visit (INDEPENDENT_AMBULATORY_CARE_PROVIDER_SITE_OTHER): Payer: Medicare Other | Admitting: Surgery

## 2019-11-27 ENCOUNTER — Encounter: Payer: Self-pay | Admitting: Surgery

## 2019-11-27 VITALS — BP 153/88 | HR 80 | Temp 97.2°F | Resp 20 | Ht 60.0 in | Wt 174.0 lb

## 2019-11-27 DIAGNOSIS — I70213 Atherosclerosis of native arteries of extremities with intermittent claudication, bilateral legs: Secondary | ICD-10-CM

## 2019-11-27 NOTE — Progress Notes (Signed)
[facility]ist [facility] [place]  Patient name: Mary Chase MRN: [id number] DOB: Apr 01, 1935 Sex: female   REQUESTING PROVIDER:    Orpah Melter   REASON FOR CONSULT:    PAD  HISTORY OF PRESENT ILLNESS:   Mary Chase is a 84 y.o. female, who is referred for further evaluation of peripheral vascular disease.  The patient has been complaining of leg pain.  She has also developed numbness along the lateral aspect of her left calf.  She has pain around her knees which really limits her activity.  She denies rest pain or nonhealing wounds.  She denies classic claudication symptoms such as leg calf cramping with activity.  She does have a ultrasound that shows ABIs which could be falsely elevated.  Patient is medically managed for hypertension with a statin.  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  She has stage III chronic renal insufficiency.  She takes a daily aspirin.  She is a non-smoker.  PAST MEDICAL HISTORY    Past Medical History:  Diagnosis Date  . Arthritis   . Asthma   . Diabetes mellitus without complication ([place])    no med ? prediabetic not diabetic per pt  . GERD (gastroesophageal reflux disease)   . Hypertension   . Sickle cell anemia (HCC)    trait     FAMILY HISTORY   History reviewed. No pertinent family history.  SOCIAL HISTORY:   Social History   Socioeconomic History  . Marital status: Widowed    Spouse name: Not on file  . Number of children: Not on file  . Years of education: Not on file  . Highest education level: Not on file  Occupational History  . Not on file  Social Needs  . Financial resource strain: Not on file  . Food insecurity    Worry: Not on file    Inability: Not on file  . Transportation needs    Medical: Not on file    Non-medical: Not on file  Tobacco Use  . Smoking status: Never Smoker  . Smokeless tobacco: Never Used  . Tobacco comment: occ wine   Substance and Sexual Activity  . Alcohol use: Yes    Comment: occ  . Drug use: No  . Sexual activity: Not on file  Lifestyle  . Physical activity    Days per week: Not on file    Minutes per session: Not on file  . Stress: Not on file  Relationships  . Social Herbalist on phone: Not on file    Gets together: Not on file    Attends religious service: Not on file    Active member of club or organization: Not on file    Attends meetings of clubs or organizations: Not on file    Relationship status: Not on file  . Intimate partner violence    Fear of current or ex partner: Not on file    Emotionally abused: Not on file    Physically abused: Not on file    Forced sexual activity: Not on file  Other Topics Concern  . Not on file  Social History Narrative  . Not on file    ALLERGIES:    Allergies  Allergen Reactions  . Penicillins Shortness Of Breath and Rash  . Keflex [Cephalexin] Itching  . Sulfa Antibiotics Itching and Other (See Comments)    Swelling in mouth  . Other Itching    Some beans  CURRENT MEDICATIONS:    Current Outpatient Medications  Medication Sig Dispense Refill  . albuterol (PROVENTIL HFA;VENTOLIN HFA) 108 (90 BASE) MCG/ACT inhaler Inhale 2 puffs into the lungs every 6 (six) hours as needed for wheezing.    Marland Kitchen aspirin 81 MG tablet Take 81 mg by mouth daily.    . Fluticasone-Salmeterol (ADVAIR) 500-50 MCG/DOSE AEPB Inhale 1 puff into the lungs every 12 (twelve) hours.    Marland Kitchen lisinopril (ZESTRIL) 40 MG tablet Take 40 mg by mouth daily.    . mometasone (NASONEX) 50 MCG/ACT nasal spray Place 2 sprays into the nose daily as needed (for allergies).     . montelukast (SINGULAIR) 10 MG tablet Take 10 mg by mouth daily.     . vitamin B-12 (CYANOCOBALAMIN) 500 MCG tablet Take 500 mcg by mouth daily.     No current facility-administered medications for this visit.     REVIEW OF SYSTEMS:   [X]  denotes positive finding, [ ]  denotes negative  finding Cardiac  Comments:  Chest pain or chest pressure: x   Shortness of breath upon exertion: x   Short of breath when lying flat:    Irregular heart rhythm:        Vascular    Pain in calf, thigh, or hip brought on by ambulation:    Pain in feet at night that wakes you up from your sleep:     Blood clot in your veins:    Leg swelling:         Pulmonary    Oxygen at home:    Productive cough:     Wheezing:         Neurologic    Sudden weakness in arms or legs:     Sudden numbness in arms or legs:     Sudden onset of difficulty speaking or slurred speech:    Temporary loss of vision in one eye:     Problems with dizziness:         Gastrointestinal    Blood in stool:      Vomited blood:         Genitourinary    Burning when urinating:     Blood in urine:        Psychiatric    Major depression:         Hematologic    Bleeding problems:    Problems with blood clotting too easily:        Skin    Rashes or ulcers:        Constitutional    Fever or chills:     PHYSICAL EXAM:   Vitals:   11/27/19 1035  BP: (!) 153/88  Pulse: 80  Resp: 20  Temp: (!) 97.2 F (36.2 C)  SpO2: 98%  Weight: 174 lb (78.9 kg)  Height: 5' (1.524 m)    GENERAL: The patient is a well-nourished female, in no acute distress. The vital signs are documented above. CARDIAC: There is a regular rate and rhythm.  VASCULAR: Palpable dorsalis pedis pulse bilaterally PULMONARY: Nonlabored respirations MUSCULOSKELETAL: There are no major deformities or cyanosis. NEUROLOGIC: No focal weakness or paresthesias are detected. SKIN: There are no ulcers or rashes noted. PSYCHIATRIC: The patient has a normal affect.  STUDIES:   I have reviewed the following:   RIGHT :1.02 Left 0.89 Right:  Resting ABI within normal limits which may be falsely elevated, as the segmental exam demonstrates evidence of developing tibial arterial disease and small vessel disease of the foot.  Left:   Resting ABI in the mild range arterial occlusive disease. Segmental exam demonstrates no significant arterial occlusive disease to the ankle. Possible development of small vessel disease of the left foot. ASSESSMENT and PLAN   PAD: The patient has evidence of small vessel disease which is likely secondary to diabetes.  Her left ABI is slightly abnormal however she does have palpable dorsalis pedis pulses bilaterally.  No intervention or further diagnostic work-up is recommended at this time.  I told the patient we should focus on medical management of her underlying comorbidities, in particular her diabetes and hypertension.  She should continue taking them baby aspirin daily.  I also discussed that I would like for her to start a statin however I will refer to Dr. Doyle Askew judgment on this.  I would like to see her LDL cholesterol less than 70.  The patient will follow-up with me in 1 year with repeat vascular lab studies.   Leia Alf, MD, FACS [facility] of [facility] [phone] Pager [phone]

## 2019-12-18 ENCOUNTER — Other Ambulatory Visit: Payer: Self-pay | Admitting: *Deleted

## 2019-12-18 DIAGNOSIS — I70213 Atherosclerosis of native arteries of extremities with intermittent claudication, bilateral legs: Secondary | ICD-10-CM

## 2020-03-21 ENCOUNTER — Ambulatory Visit: Payer: Medicare Other | Attending: Internal Medicine

## 2020-03-22 ENCOUNTER — Ambulatory Visit: Payer: Medicare Other | Attending: Internal Medicine

## 2020-03-22 DIAGNOSIS — Z23 Encounter for immunization: Secondary | ICD-10-CM

## 2020-03-22 NOTE — Progress Notes (Signed)
   Covid-19 Vaccination Clinic  Name:  Mary Chase    MRN: [id number] DOB: June 10, 1935  03/22/2020  [name] was observed post Covid-19 immunization for 30 minutes based on pre-vaccination screening without incident. She was provided with Vaccine Information Sheet and instruction to access the V-Safe system.   [name] was instructed to call 911 with any severe reactions post vaccine: Marland Kitchen Difficulty breathing  . Swelling of face and throat  . A fast heartbeat  . A bad rash all over body  . Dizziness and weakness   Immunizations Administered    Name Date Dose VIS Date Route   Pfizer COVID-19 Vaccine 03/22/2020 10:20 AM 0.3 mL 12/01/2019 Intramuscular   Manufacturer: [employer], [employer]   Lot: [id number]   [place]: [id number]

## 2020-04-16 ENCOUNTER — Ambulatory Visit: Payer: Medicare Other | Attending: Internal Medicine

## 2020-04-16 DIAGNOSIS — Z23 Encounter for immunization: Secondary | ICD-10-CM

## 2020-04-16 NOTE — Progress Notes (Signed)
   Covid-19 Vaccination Clinic  Name:  Mary Chase    MRN: [id number] DOB: [birth year]  04/16/2020  [name] was observed post Covid-19 immunization for 15 minutes without incident. She was provided with Vaccine Information Sheet and instruction to access the V-Safe system.   Ms. Noviello was instructed to call 911 with any severe reactions post vaccine: Marland Kitchen Difficulty breathing  . Swelling of face and throat  . A fast heartbeat  . A bad rash all over body  . Dizziness and weakness   Immunizations Administered    Name Date Dose VIS Date Route   Pfizer COVID-19 Vaccine 04/16/2020  1:17 PM 0.3 mL 02/14/2019 Intramuscular   Manufacturer: [place]   Lot: [id number]   [place]: [id number]

## 2020-08-13 ENCOUNTER — Other Ambulatory Visit: Payer: Self-pay | Admitting: Orthopedic Surgery

## 2020-09-09 NOTE — Progress Notes (Addendum)
COVID Vaccine Completed: x2 Date COVID Vaccine completed: 03-22-20 & 04-16-20 COVID vaccine manufacturer: [employer]   PCP - Orpah Melter, MD Cardiologist -   Chest x-ray -  EKG - 09-10-20 in Epic Stress Test -  ECHO -  Cardiac Cath -  Pacemaker/ICD device last checked:  Sleep Study -  CPAP -   Fasting Blood Sugar -  Checks Blood Sugar _____ times a day  Blood Thinner Instructions: Aspirin Instructions: Last Dose:  Anesthesia review: Asthma & COPD.  BP elevated at PAT visit (evaluated by Janett Billow)  Patient has shortness of breath and cough with exertion.  Denies fever and chest pain at PAT appointment   Patient verbalized understanding of instructions that were given to them at the PAT appointment. Patient was also instructed that they will need to review over the PAT instructions again at home before surgery.

## 2020-09-09 NOTE — Patient Instructions (Addendum)
DUE TO COVID-19 ONLY ONE VISITOR IS ALLOWED TO COME WITH YOU AND STAY IN THE WAITING ROOM ONLY DURING PRE OP AND PROCEDURE.   IF YOU WILL BE ADMITTED INTO THE HOSPITAL YOU ARE ALLOWED ONE SUPPORT PERSON DURING VISITATION HOURS ONLY (10AM -8PM)   . The support person may change daily. . The support person must pass our screening, gel in and out, and wear a mask at all times, including in the patient's room. . Patients must also wear a mask when staff or their support person are in the room.   COVID SWAB TESTING MUST BE COMPLETED ON:  Thursday, 09-12-20 @ 11:30 AM   [address] [place], [place] [address]  (Must self quarantine after testing. Follow instructions on handout.)        Your procedure is scheduled on:  Monday, 09-16-20   Report to [facility] Main  Entrance   Report to Short Stay at 5:30 AM   [facility])    Call this number if you have problems the morning of surgery [phone]   Do not eat food :After Midnight.   May have liquids until 4:15 AM  day of surgery  CLEAR LIQUID DIET  Foods Allowed                                                                     Foods Excluded  Water, Black Coffee and tea, regular and decaf             liquids that you cannot  Plain Jell-O in any flavor  (No red)                                    see through such as: Fruit ices (not with fruit pulp)                                      milk, soups, orange juice              Iced Popsicles (No red)                                      All solid food                                   Apple juices Sports drinks like Gatorade (No red) Lightly seasoned clear broth or consume(fat free) Sugar, honey syrup      Complete one G2 drink the morning of surgery at 4:15 AM the day of surgery.    Oral Hygiene is also important to reduce your risk of infection.                                    Remember - BRUSH YOUR TEETH THE MORNING OF SURGERY WITH YOUR REGULAR TOOTHPASTE   Do NOT  smoke after Midnight  Take these medicines the morning of surgery with A SIP OF WATER: Montelukast (Singulair), okay to use inhaler and bring with you day of surgery                                You may not have any metal on your body including hair pins, jewelry, and body piercings             Do not wear make-up, lotions, powders, perfumes/cologne, or deodorant             Do not wear nail polish.  Do not shave  48 hours prior to surgery.     Do not bring valuables to the hospital. [place].   Contacts, dentures or bridgework may not be worn into surgery.   Patients discharged the day of surgery will not be allowed to drive home.               Please read over the following fact sheets you were given: IF YOU HAVE QUESTIONS ABOUT YOUR PRE OP INSTRUCTIONS PLEASE CALL [phone]   [place] - Preparing for Surgery Before surgery, you can play an important role.  Because skin is not sterile, your skin needs to be as free of germs as possible.  You can reduce the number of germs on your skin by washing with CHG (chlorahexidine gluconate) soap before surgery.  CHG is an antiseptic cleaner which kills germs and bonds with the skin to continue killing germs even after washing. Please DO NOT use if you have an allergy to CHG or antibacterial soaps.  If your skin becomes reddened/irritated stop using the CHG and inform your nurse when you arrive at Short Stay. Do not shave (including legs and underarms) for at least 48 hours prior to the first CHG shower.  You may shave your face/neck.  Please follow these instructions carefully:  1.  Shower with CHG Soap the night before surgery and the  morning of surgery.  2.  If you choose to wash your hair, wash your hair first as usual with your normal  shampoo.  3.  After you shampoo, rinse your hair and body thoroughly to remove the shampoo.                             4.  Use CHG as you would any other  liquid soap.  You can apply chg directly to the skin and wash.  Gently with a scrungie or clean washcloth.  5.  Apply the CHG Soap to your body ONLY FROM THE NECK DOWN.   Do   not use on face/ open                           Wound or open sores. Avoid contact with eyes, ears mouth and   genitals (private parts).                       Wash face,  Genitals (private parts) with your normal soap.             6.  Wash thoroughly, paying special attention to the area where your    surgery  will be performed.  7.  Thoroughly rinse your body with warm water from the neck down.  8.  DO NOT shower/wash with your normal soap after using and rinsing off the CHG Soap.                9.  Pat yourself dry with a clean towel.            10.  Wear clean pajamas.            11.  Place clean sheets on your bed the night of your first shower and do not  sleep with pets. Day of Surgery : Do not apply any lotions/deodorants the morning of surgery.  Please wear clean clothes to the hospital/surgery center.  FAILURE TO FOLLOW THESE INSTRUCTIONS MAY RESULT IN THE CANCELLATION OF YOUR SURGERY  PATIENT SIGNATURE_________________________________  NURSE SIGNATURE__________________________________  ________________________________________________________________________  WHAT IS A BLOOD TRANSFUSION? Blood Transfusion Information  A transfusion is the replacement of blood or some of its parts. Blood is made up of multiple cells which provide different functions.  Red blood cells carry oxygen and are used for blood loss replacement.  White blood cells fight against infection.  Platelets control bleeding.  Plasma helps clot blood.  Other blood products are available for specialized needs, such as hemophilia or other clotting disorders. BEFORE THE TRANSFUSION  Who gives blood for transfusions?   Healthy volunteers who are fully evaluated to make sure their blood is safe. This is blood bank blood. Transfusion  therapy is the safest it has ever been in the practice of medicine. Before blood is taken from a donor, a complete history is taken to make sure that person has no history of diseases nor engages in risky social behavior (examples are intravenous drug use or sexual activity with multiple partners). The donor's travel history is screened to minimize risk of transmitting infections, such as malaria. The donated blood is tested for signs of infectious diseases, such as HIV and hepatitis. The blood is then tested to be sure it is compatible with you in order to minimize the chance of a transfusion reaction. If you or a relative donates blood, this is often done in anticipation of surgery and is not appropriate for emergency situations. It takes many days to process the donated blood. RISKS AND COMPLICATIONS Although transfusion therapy is very safe and saves many lives, the main dangers of transfusion include:   Getting an infectious disease.  Developing a transfusion reaction. This is an allergic reaction to something in the blood you were given. Every precaution is taken to prevent this. The decision to have a blood transfusion has been considered carefully by your caregiver before blood is given. Blood is not given unless the benefits outweigh the risks. AFTER THE TRANSFUSION  Right after receiving a blood transfusion, you will usually feel much better and more energetic. This is especially true if your red blood cells have gotten low (anemic). The transfusion raises the level of the red blood cells which carry oxygen, and this usually causes an energy increase.  The nurse administering the transfusion will monitor you carefully for complications. HOME CARE INSTRUCTIONS  No special instructions are needed after a transfusion. You may find your energy is better. Speak with your caregiver about any limitations on activity for underlying diseases you may have. SEEK MEDICAL CARE IF:   Your condition is  not improving after your transfusion.  You develop redness or irritation at the intravenous (IV) site. SEEK IMMEDIATE MEDICAL CARE IF:  Any of the following symptoms occur over the next 12 hours:  Shaking chills.  You have a temperature by mouth above 102 F (38.9 C), not controlled by medicine.  Chest, back, or muscle pain.  People around you feel you are not acting correctly or are confused.  Shortness of breath or difficulty breathing.  Dizziness and fainting.  You get a rash or develop hives.  You have a decrease in urine output.  Your urine turns a dark color or changes to pink, red, or brown. Any of the following symptoms occur over the next 10 days:  You have a temperature by mouth above 102 F (38.9 C), not controlled by medicine.  Shortness of breath.  Weakness after normal activity.  The white part of the eye turns yellow (jaundice).  You have a decrease in the amount of urine or are urinating less often.  Your urine turns a dark color or changes to pink, red, or brown. Document Released: 12/04/2000 Document Revised: 02/29/2012 Document Reviewed: 07/23/2008 ExitCare Patient Information 2014 [place].  _______________________________________________________________________  Incentive Spirometer  An incentive spirometer is a tool that can help keep your lungs clear and active. This tool measures how well you are filling your lungs with each breath. Taking long deep breaths may help reverse or decrease the chance of developing breathing (pulmonary) problems (especially infection) following:  A long period of time when you are unable to move or be active. BEFORE THE PROCEDURE   If the spirometer includes an indicator to show your best effort, your nurse or respiratory therapist will set it to a desired goal.  If possible, sit up straight or lean slightly forward. Try not to slouch.  Hold the incentive spirometer in an upright position. INSTRUCTIONS  FOR USE  1. Sit on the edge of your bed if possible, or sit up as far as you can in bed or on a chair. 2. Hold the incentive spirometer in an upright position. 3. Breathe out normally. 4. Place the mouthpiece in your mouth and seal your lips tightly around it. 5. Breathe in slowly and as deeply as possible, raising the piston or the ball toward the top of the column. 6. Hold your breath for 3-5 seconds or for as long as possible. Allow the piston or ball to fall to the bottom of the column. 7. Remove the mouthpiece from your mouth and breathe out normally. 8. Rest for a few seconds and repeat Steps 1 through 7 at least 10 times every 1-2 hours when you are awake. Take your time and take a few normal breaths between deep breaths. 9. The spirometer may include an indicator to show your best effort. Use the indicator as a goal to work toward during each repetition. 10. After each set of 10 deep breaths, practice coughing to be sure your lungs are clear. If you have an incision (the cut made at the time of surgery), support your incision when coughing by placing a pillow or rolled up towels firmly against it. Once you are able to get out of bed, walk around indoors and cough well. You may stop using the incentive spirometer when instructed by your caregiver.  RISKS AND COMPLICATIONS  Take your time so you do not get dizzy or light-headed.  If you are in pain, you may need to take or ask for pain medication before doing incentive spirometry. It is harder to take a deep breath if you are having pain. AFTER USE  Rest and breathe slowly and easily.  It can be helpful to keep track of a log of  your progress. Your caregiver can provide you with a simple table to help with this. If you are using the spirometer at home, follow these instructions: [place] IF:   You are having difficultly using the spirometer.  You have trouble using the spirometer as often as instructed.  Your pain  medication is not giving enough relief while using the spirometer.  You develop fever of 100.5 F (38.1 C) or higher. SEEK IMMEDIATE MEDICAL CARE IF:   You cough up bloody sputum that had not been present before.  You develop fever of 102 F (38.9 C) or greater.  You develop worsening pain at or near the incision site. MAKE SURE YOU:   Understand these instructions.  Will watch your condition.  Will get help right away if you are not doing well or get worse. Document Released: 04/19/2007 Document Revised: 02/29/2012 Document Reviewed: 06/20/2007 [facility] Patient Information 2014 [place], [place].   ________________________________________________________________________

## 2020-09-10 ENCOUNTER — Emergency Department (HOSPITAL_BASED_OUTPATIENT_CLINIC_OR_DEPARTMENT_OTHER)
Admission: EM | Admit: 2020-09-10 | Discharge: 2020-09-10 | Disposition: A | Discharge status: Home / Self Care | Payer: Medicare Other | Attending: Emergency Medicine | Admitting: Emergency Medicine

## 2020-09-10 ENCOUNTER — Emergency Department (HOSPITAL_BASED_OUTPATIENT_CLINIC_OR_DEPARTMENT_OTHER): Payer: Medicare Other

## 2020-09-10 ENCOUNTER — Ambulatory Visit (HOSPITAL_COMMUNITY)
Admission: RE | Admit: 2020-09-10 | Discharge: 2020-09-10 | Disposition: A | Discharge status: Home / Self Care | Payer: Medicare Other | Source: Ambulatory Visit | Attending: Orthopedic Surgery | Admitting: Orthopedic Surgery

## 2020-09-10 ENCOUNTER — Encounter (HOSPITAL_COMMUNITY)
Admission: RE | Admit: 2020-09-10 | Discharge: 2020-09-10 | Disposition: A | Discharge status: Home / Self Care | Payer: Medicare Other | Source: Ambulatory Visit | Attending: Orthopedic Surgery | Admitting: Orthopedic Surgery

## 2020-09-10 ENCOUNTER — Encounter (HOSPITAL_BASED_OUTPATIENT_CLINIC_OR_DEPARTMENT_OTHER): Payer: Self-pay | Admitting: Emergency Medicine

## 2020-09-10 ENCOUNTER — Encounter (HOSPITAL_COMMUNITY): Payer: Self-pay

## 2020-09-10 ENCOUNTER — Other Ambulatory Visit: Payer: Self-pay

## 2020-09-10 DIAGNOSIS — R519 Headache, unspecified: Secondary | ICD-10-CM | POA: Insufficient documentation

## 2020-09-10 DIAGNOSIS — I10 Essential (primary) hypertension: Secondary | ICD-10-CM | POA: Diagnosis not present

## 2020-09-10 DIAGNOSIS — Z01818 Encounter for other preprocedural examination: Secondary | ICD-10-CM | POA: Insufficient documentation

## 2020-09-10 DIAGNOSIS — Z79899 Other long term (current) drug therapy: Secondary | ICD-10-CM | POA: Insufficient documentation

## 2020-09-10 DIAGNOSIS — J449 Chronic obstructive pulmonary disease, unspecified: Secondary | ICD-10-CM | POA: Insufficient documentation

## 2020-09-10 HISTORY — DX: Unspecified cataract: H26.9

## 2020-09-10 HISTORY — DX: Headache, unspecified: R51.9

## 2020-09-10 HISTORY — DX: Sickle-cell trait: D57.3

## 2020-09-10 HISTORY — DX: Prediabetes: R73.03

## 2020-09-10 HISTORY — DX: Dyspnea, unspecified: R06.00

## 2020-09-10 HISTORY — DX: Chronic obstructive pulmonary disease, unspecified: J44.9

## 2020-09-10 HISTORY — DX: Pneumonia, unspecified organism: J18.9

## 2020-09-10 LAB — URINALYSIS, ROUTINE W REFLEX MICROSCOPIC
Bacteria, UA: NONE SEEN
Bilirubin Urine: NEGATIVE
Glucose, UA: NEGATIVE mg/dL
Hgb urine dipstick: NEGATIVE
Ketones, ur: NEGATIVE mg/dL
Leukocytes,Ua: NEGATIVE
Nitrite: NEGATIVE
Protein, ur: 30 mg/dL — AB
Specific Gravity, Urine: 1.005 (ref 1.005–1.030)
pH: 8 (ref 5.0–8.0)

## 2020-09-10 LAB — APTT: aPTT: 38 seconds — ABNORMAL HIGH (ref 24–36)

## 2020-09-10 LAB — CBC WITH DIFFERENTIAL/PLATELET
Abs Immature Granulocytes: 0.04 10*3/uL (ref 0.00–0.07)
Basophils Absolute: 0.1 10*3/uL (ref 0.0–0.1)
Basophils Relative: 1 %
Eosinophils Absolute: 0.5 10*3/uL (ref 0.0–0.5)
Eosinophils Relative: 6 %
HCT: 36.8 % (ref 36.0–46.0)
Hemoglobin: 12.4 g/dL (ref 12.0–15.0)
Immature Granulocytes: 1 %
Lymphocytes Relative: 28 %
Lymphs Abs: 2.3 10*3/uL (ref 0.7–4.0)
MCH: 31.6 pg (ref 26.0–34.0)
MCHC: 33.7 g/dL (ref 30.0–36.0)
MCV: 93.9 fL (ref 80.0–100.0)
Monocytes Absolute: 0.5 10*3/uL (ref 0.1–1.0)
Monocytes Relative: 5 %
Neutro Abs: 5 10*3/uL (ref 1.7–7.7)
Neutrophils Relative %: 59 %
Platelets: 230 10*3/uL (ref 150–400)
RBC: 3.92 MIL/uL (ref 3.87–5.11)
RDW: 12.9 % (ref 11.5–15.5)
WBC: 8.4 10*3/uL (ref 4.0–10.5)
nRBC: 0 % (ref 0.0–0.2)

## 2020-09-10 LAB — BASIC METABOLIC PANEL
Anion gap: 10 (ref 5–15)
BUN: 7 mg/dL — ABNORMAL LOW (ref 8–23)
CO2: 29 mmol/L (ref 22–32)
Calcium: 9.9 mg/dL (ref 8.9–10.3)
Chloride: 100 mmol/L (ref 98–111)
Creatinine, Ser: 0.75 mg/dL (ref 0.44–1.00)
GFR calc Af Amer: >60 mL/min (ref >60)
GFR calc non Af Amer: >60 mL/min (ref >60)
Glucose, Bld: 100 mg/dL — ABNORMAL HIGH (ref 70–99)
Potassium: 3.9 mmol/L (ref 3.5–5.1)
Sodium: 139 mmol/L (ref 135–145)

## 2020-09-10 LAB — HEMOGLOBIN A1C
Hgb A1c MFr Bld: 5.4 % (ref 4.8–5.6)
Mean Plasma Glucose: 108.28 mg/dL

## 2020-09-10 LAB — SURGICAL PCR SCREEN
MRSA, PCR: NEGATIVE
Staphylococcus aureus: NEGATIVE

## 2020-09-10 LAB — PROTIME-INR
INR: 1.2 (ref 0.8–1.2)
Prothrombin Time: 15 seconds (ref 11.4–15.2)

## 2020-09-10 MED ORDER — LABETALOL HCL 5 MG/ML IV SOLN
20.0000 mg | Freq: Once | INTRAVENOUS | Status: AC
  Administered 2020-09-10: 20 mg via INTRAVENOUS
  Filled 2020-09-10: qty 4

## 2020-09-10 MED ORDER — ACETAMINOPHEN 500 MG PO TABS
1000.0000 mg | ORAL_TABLET | Freq: Once | ORAL | Status: AC
  Administered 2020-09-10: 1000 mg via ORAL
  Filled 2020-09-10: qty 2

## 2020-09-10 NOTE — ED Notes (Signed)
Pt given healthy choice carckers , ginger ale and cheese , states has not eaten all day family in room

## 2020-09-10 NOTE — ED Triage Notes (Signed)
Pt is here from [place] [facility] today with HTN. Dr. Durene Cal her here today for evaluation, she is having headaches 5/10 that comes and goes since this morning. Denies any neuro symptoms. Speech clear, strength equal

## 2020-09-10 NOTE — ED Provider Notes (Signed)
[facility] EMERGENCY DEPARTMENT Provider Note  CSN: [id number] Arrival date & time: 09/10/20 1725    History Chief Complaint  Patient presents with  . Hypertension    HPI  Mary Chase is a 85 y.o. female with history of HTN well controlled on lisinopril. She was at [facility] earlier today for a pre-op visit prior to scheduled hip replacement next week when she was noted to be hypertensive. She has been having a mild-moderate diffuse dull headache. She was advised to go to [facility] and went to the [facility] clinic and subsequently sent to the ED. She denies any CP, no nausea, vomiting. No recent illness. She occasionally takes APAP for headache with good results but has not had any today. She took her BP meds this morning as scheduled and a second dose this afternoon before coming to the ED.    Past Medical History:  Diagnosis Date  . Arthritis   . Asthma   . Cataract   . COPD (chronic obstructive pulmonary disease) ([place])   . Dyspnea    with exertion  . GERD (gastroesophageal reflux disease)   . Headache   . Hypertension   . Pneumonia   . Pre-diabetes    A1c within normal limits last check  . Sickle cell anemia (HCC)    trait  . Sickle cell trait [facility])     Past Surgical History:  Procedure Laterality Date  . ANKLE SURGERY Right 02   pin  . BREAST BIOPSY Right 11/01/2013   Procedure: RIGHT BREAST MASS EXCISION;  Surgeon: Rolm Bookbinder, MD;  Location: [place];  Service: General;  Laterality: Right;  . ELBOW SURGERY Right    pin  . MULTIPLE TOOTH EXTRACTIONS    . TUBAL LIGATION      No family history on file.  Social History   Tobacco Use  . Smoking status: Never Smoker  . Smokeless tobacco: Never Used  . Tobacco comment: occ wine  Vaping Use  . Vaping Use: Never used  Substance Use Topics  . Alcohol use: Yes    Comment: occ  . Drug use: No     Home Medications Prior to Admission medications   Medication Sig Start Date End Date Taking?  Authorizing Provider  albuterol (PROVENTIL HFA;VENTOLIN HFA) 108 (90 BASE) MCG/ACT inhaler Inhale 2 puffs into the lungs every 6 (six) hours as needed for wheezing.    [provider]  Cholecalciferol (VITAMIN D3) 25 MCG (1000 UT) CAPS Take 1,000 Units by mouth daily in the afternoon.    [provider]  Cyanocobalamin (VITAMIN B-12) 5000 MCG TBDP Take 5,000 mcg by mouth daily.     [provider]  diclofenac Sodium (VOLTAREN) 1 % GEL Apply 2 g topically daily as needed (Pain).    [provider]  lisinopril (ZESTRIL) 40 MG tablet Take 40 mg by mouth daily. 09/11/19   [provider]  magnesium gluconate (MAGONATE) 500 MG tablet Take 500 mg by mouth daily.    [provider]  montelukast (SINGULAIR) 10 MG tablet Take 10 mg by mouth daily.     [provider]  Multiple Vitamins-Minerals (PRESERVISION AREDS 2 PO) Take 2 tablets by mouth daily in the afternoon.    [provider]  Grant Ruts INHUB 250-50 MCG/DOSE AEPB Inhale 1 puff into the lungs 2 (two) times daily. 07/08/20   [provider]     Allergies    Penicillins, Keflex [cephalexin], Sulfa antibiotics, and Other   Review  of Systems   Review of Systems A comprehensive review of systems was completed and negative except as noted in HPI.    Physical Exam BP (!) 149/90 (BP Location: Left Arm)   Pulse 77   Temp 98.6 F (37 C) (Oral)   Resp 17   Ht 5' (1.524 m)   Wt 70.3 kg   SpO2 97%   BMI 30.27 kg/m   Physical Exam Vitals and nursing note reviewed.  Constitutional:      Appearance: Normal appearance.  HENT:     Head: Normocephalic and atraumatic.     Nose: Nose normal.     Mouth/Throat:     Mouth: Mucous membranes are moist.  Eyes:     Extraocular Movements: Extraocular movements intact.     Conjunctiva/sclera: Conjunctivae normal.  Cardiovascular:     Rate and Rhythm: Normal rate.  Pulmonary:     Effort: Pulmonary effort is normal.      Breath sounds: Normal breath sounds.  Abdominal:     General: Abdomen is flat.     Palpations: Abdomen is soft.     Tenderness: There is no abdominal tenderness.  Musculoskeletal:        General: No swelling. Normal range of motion.     Cervical back: Neck supple.  Skin:    General: Skin is warm and dry.  Neurological:     General: No focal deficit present.     Mental Status: She is alert.  Psychiatric:        Mood and Affect: Mood normal.      ED Results / Procedures / Treatments   Labs (all labs ordered are listed, but only abnormal results are displayed) Labs Reviewed - No data to display  EKG None   Radiology CT Head Wo Contrast  Result Date: 09/10/2020 CLINICAL DATA:  Headache. EXAM: CT HEAD WITHOUT CONTRAST TECHNIQUE: Contiguous axial images were obtained from the base of the skull through the vertex without intravenous contrast. COMPARISON:  None. FINDINGS: Brain: Mild chronic ischemic white matter disease is noted. No mass effect or midline shift is noted. Ventricular size is within normal limits. There is no evidence of mass lesion, hemorrhage or acute infarction. Vascular: No hyperdense vessel or unexpected calcification. Skull: Normal. Negative for fracture or focal lesion. Sinuses/Orbits: Pan sinusitis is noted. Other: None. IMPRESSION: Mild chronic ischemic white matter disease. Pan sinusitis. No acute intracranial abnormality seen. Electronically Signed   By: Marijo Conception M.D.   On: 09/10/2020 19:04    Procedures Procedures  Medications Ordered in the ED Medications  labetalol (NORMODYNE) injection 20 mg (20 mg Intravenous Given 09/10/20 1911)  acetaminophen (TYLENOL) tablet 1,000 mg (1,000 mg Oral Given 09/10/20 1910)     MDM Rules/Calculators/A&P MDM Labs at [facility] earlier today reviewed including UA, CBC and BMP which were normal. EKG with mild LVH but no concerning acute changes. Will check head CT due to persistent headache, give some IV labetalol for BP  >200 and APAP for headache.  ED Course  I have reviewed the triage vital signs and the nursing notes.  Pertinent labs & imaging results that were available during my care of the patient were reviewed by me and considered in my medical decision making (see chart for details).  Clinical Course as of Sep 11 2011  Tue Sep 10, 2020  2010 Head CT reviewed and neg for acute process. BP improved. Headache improved and patient ready to go home. Advised to monitor BP at home, contact PCP  if remains elevated for long term management. RTED for any other concerns.    [CS]    Clinical Course User Index [CS] Truddie Hidden, MD    Final Clinical Impression(s) / ED Diagnoses Final diagnoses:  Hypertension, unspecified type  Acute nonintractable headache, unspecified headache type    Rx / DC Orders ED Discharge Orders    None       Truddie Hidden, MD 09/10/20 2012

## 2020-09-11 DIAGNOSIS — M1611 Unilateral primary osteoarthritis, right hip: Secondary | ICD-10-CM | POA: Diagnosis present

## 2020-09-11 NOTE — H&P (Signed)
TOTAL HIP ADMISSION H&P  Patient is admitted for right total hip arthroplasty.  Subjective:  Chief Complaint: right hip pain  HPI: Mary Chase, 85 y.o. female, has a history of pain and functional disability in the right hip(s) due to arthritis and patient has failed non-surgical conservative treatments for greater than 12 weeks to include NSAID's and/or analgesics, flexibility and strengthening excercises, weight reduction as appropriate and activity modification.  Onset of symptoms was gradual starting 6 years ago with gradually worsening course since that time.The patient noted no past surgery on the right hip(s).  Patient currently rates pain in the right hip at 10 out of 10 with activity. Patient has night pain, worsening of pain with activity and weight bearing, trendelenberg gait, pain that interfers with activities of daily living and pain with passive range of motion. Patient has evidence of subchondral cysts, periarticular osteophytes, joint subluxation and joint space narrowing by imaging studies. This condition presents safety issues increasing the risk of falls.    There is no current active infection.  Patient Active Problem List   Diagnosis Date Noted  . Obstructive lung disease ([place]) 08/31/2015  . H/O allergic rhinitis 08/31/2015   Past Medical History:  Diagnosis Date  . Arthritis   . Asthma   . Cataract   . COPD (chronic obstructive pulmonary disease) ([place])   . Dyspnea    with exertion  . GERD (gastroesophageal reflux disease)   . Headache   . Hypertension   . Pneumonia   . Pre-diabetes    A1c within normal limits last check  . Sickle cell anemia (HCC)    trait  . Sickle cell trait [facility])     Past Surgical History:  Procedure Laterality Date  . ANKLE SURGERY Right 02   pin  . BREAST BIOPSY Right 11/01/2013   Procedure: RIGHT BREAST MASS EXCISION;  Surgeon: Rolm Bookbinder, MD;  Location: [place];  Service: General;  Laterality: Right;  . ELBOW SURGERY  Right    pin  . MULTIPLE TOOTH EXTRACTIONS    . TUBAL LIGATION      No current facility-administered medications for this encounter.   Current Outpatient Medications  Medication Sig Dispense Refill Last Dose  . albuterol (PROVENTIL HFA;VENTOLIN HFA) 108 (90 BASE) MCG/ACT inhaler Inhale 2 puffs into the lungs every 6 (six) hours as needed for wheezing.     . Cholecalciferol (VITAMIN D3) 25 MCG (1000 UT) CAPS Take 1,000 Units by mouth daily in the afternoon.     . Cyanocobalamin (VITAMIN B-12) 5000 MCG TBDP Take 5,000 mcg by mouth daily.      . diclofenac Sodium (VOLTAREN) 1 % GEL Apply 2 g topically daily as needed (Pain).     Marland Kitchen lisinopril (ZESTRIL) 40 MG tablet Take 40 mg by mouth daily.     . magnesium gluconate (MAGONATE) 500 MG tablet Take 500 mg by mouth daily.     . montelukast (SINGULAIR) 10 MG tablet Take 10 mg by mouth daily.      . Multiple Vitamins-Minerals (PRESERVISION AREDS 2 PO) Take 2 tablets by mouth daily in the afternoon.     Grant Ruts INHUB 250-50 MCG/DOSE AEPB Inhale 1 puff into the lungs 2 (two) times daily.      Allergies  Allergen Reactions  . Penicillins Shortness Of Breath and Rash  . Keflex [Cephalexin] Itching  . Sulfa Antibiotics Itching and Other (See Comments)    Swelling in mouth  . Other Itching    Some beans  Social History   Tobacco Use  . Smoking status: Never Smoker  . Smokeless tobacco: Never Used  . Tobacco comment: occ wine  Substance Use Topics  . Alcohol use: Yes    Comment: occ    No family history on file.   Review of Systems  Constitutional: Positive for diaphoresis and fatigue.  HENT: Positive for hearing loss and tinnitus.   Eyes: Negative.   Respiratory: Positive for shortness of breath.   Cardiovascular: Positive for chest pain and leg swelling.  Gastrointestinal: Positive for nausea and vomiting.  Endocrine: Negative.   Genitourinary: Negative.   Musculoskeletal: Positive for arthralgias and myalgias.  Skin:  Negative.   Allergic/Immunologic: Negative.   Neurological: Positive for dizziness.  Hematological: Negative.   Psychiatric/Behavioral: The patient is nervous/anxious.     Objective:  Physical Exam Constitutional:      Appearance: Normal appearance.  HENT:     Head: Normocephalic and atraumatic.     Nose: Nose normal.  Eyes:     Pupils: Pupils are equal, round, and reactive to light.  Cardiovascular:     Pulses: Normal pulses.  Pulmonary:     Effort: Pulmonary effort is normal.  Musculoskeletal:     Cervical back: Normal range of motion and neck supple.     Comments: Internal rotation of either hip to 10 causes severe pain there is crepitus as you take her through a range of motion of the hip both knees have 5 flexion contractures with some crepitus as well as you bring the knees full extension it does cause knee pain.  Nontender over the greater trochanters nontender to palpation over the gluteal muscles.  Foot taps are negative.  Toes are pink and well perfused neurovascular intact.  Skin:    General: Skin is warm and dry.  Neurological:     General: No focal deficit present.     Mental Status: She is alert and oriented to person, place, and time. Mental status is at baseline.  Psychiatric:        Mood and Affect: Mood normal.        Behavior: Behavior normal.        Thought Content: Thought content normal.        Judgment: Judgment normal.     Vital signs in last 24 hours: Temp:  [98.6 F (37 C)-99.7 F (37.6 C)] 98.6 F (37 C) (09/21 2024) Pulse Rate:  [77-100] 82 (09/21 2024) Resp:  [16-20] 16 (09/21 2024) BP: (148-207)/(90-126) 148/94 (09/21 2024) SpO2:  [95 %-99 %] 97 % (09/21 2024) Weight:  [70.3 kg] 70.3 kg (09/21 1747)  Labs:   Estimated body mass index is 30.27 kg/m as calculated from the following:   Height as of 09/10/20: 5' (1.524 m).   Weight as of 09/10/20: 70.3 kg.   Imaging Review Plain radiographs demonstrate AP pelvis and crosstable  lateral shows end-stage erosive arthritis of both hips with flattening of the femoral heads large subchondral cysts in the femoral head peripheral osteophytes and lateral subluxation of femoral heads inside the acetabulum.   Assessment/Plan:  End stage arthritis, right hip(s)  The patient history, physical examination, clinical judgement of the provider and imaging studies are consistent with end stage degenerative joint disease of the right hip(s) and total hip arthroplasty is deemed medically necessary. The treatment options including medical management, injection therapy, arthroscopy and arthroplasty were discussed at length. The risks and benefits of total hip arthroplasty were presented and reviewed. The risks due to aseptic loosening,  infection, stiffness, dislocation/subluxation,  thromboembolic complications and other imponderables were discussed.  The patient acknowledged the explanation, agreed to proceed with the plan and consent was signed. Patient is being admitted for inpatient treatment for surgery, pain control, PT, OT, prophylactic antibiotics, VTE prophylaxis, progressive ambulation and ADL's and discharge planning.The patient is planning to be discharged home with home health services    Patient's anticipated LOS is less than 2 midnights, meeting these requirements: - Younger than 46 - Lives within 1 hour of care - Has a competent adult at home to recover with post-op recover - NO history of  - Chronic pain requiring opiods  - Diabetes  - Coronary Artery Disease  - Heart failure  - Heart attack  - Stroke  - DVT/VTE  - Cardiac arrhythmia  - Respiratory Failure/COPD  - Renal failure  - Anemia  - Advanced Liver disease

## 2020-09-12 ENCOUNTER — Other Ambulatory Visit (HOSPITAL_COMMUNITY): Payer: Medicare Other

## 2020-09-12 NOTE — Progress Notes (Signed)
Anesthesia Chart Review   Case: [id number] Date/Time: 09/16/20 0700   Procedure: RIGHT TOTAL HIP ARTHROPLASTY ANTERIOR APPROACH (Right Hip)   Anesthesia type: Spinal   Pre-op diagnosis: RIGHT HIP OSTEOARTHRITIS   Location: [facility] [facility] / [facility]   Surgeons: Frederik Pear, MD      DISCUSSION:85 y.o. never smoker with h/o HTN, GERD, COPD, pre-diabetes, asthma, PVD, right hip OA scheduled for above procedure 09/16/2020 with Dr. Frederik Pear.   BP 217/126 at PAT visit.  Upon recheck 183/103.  Pt reports she did take her BP medication this morning.  She complains of headache.  Reports occasionally has high BP readings at home.  EKG with no acute changes.  Advised to contact PCP today to be seen or go to the ED, pt concerned about wait times going to the ED.  After leaving PAT pt went to walk in clinic at PCP office and referred to ED for evaluation.  Given IV labetalol and APAP for HA.  Head CT normal.  Advised to follow up with PCP.  Dr. Mayer Camel informed.  Will evaluate BP DOS.  Discussed with pt the importance of better BP control prior to surgery and risk of being cancelled.    VS: BP (!) 183/103   Pulse 100   Temp 37.6 C (Oral)   Resp 16   Ht 5' (1.524 m)   Wt 70.3 kg   SpO2 95%   BMI 30.27 kg/m   PROVIDERS: Orpah Melter, MD is PCP    LABS: Labs reviewed: Acceptable for surgery. (all labs ordered are listed, but only abnormal results are displayed)  Labs Reviewed  APTT - Abnormal; Notable for the following components:      Result Value   aPTT 38 (*)    All other components within normal limits  BASIC METABOLIC PANEL - Abnormal; Notable for the following components:   Glucose, Bld 100 (*)    BUN 7 (*)    All other components within normal limits  URINALYSIS, ROUTINE W REFLEX MICROSCOPIC - Abnormal; Notable for the following components:   Color, Urine STRAW (*)    Protein, ur 30 (*)    All other components within normal limits  SURGICAL PCR SCREEN  CBC WITH DIFFERENTIAL/PLATELET   PROTIME-INR  HEMOGLOBIN A1C  TYPE AND SCREEN     IMAGES:   EKG: 09/10/20 Rate 93 bpm  NSR Minimal voltage criteria for LVH, may be normal variant No significant change since 2014  CV:  Past Medical History:  Diagnosis Date  . Arthritis   . Asthma   . Cataract   . COPD (chronic obstructive pulmonary disease) ([place])   . Dyspnea    with exertion  . GERD (gastroesophageal reflux disease)   . Headache   . Hypertension   . Pneumonia   . Pre-diabetes    A1c within normal limits last check  . Sickle cell anemia (HCC)    trait  . Sickle cell trait [facility])     Past Surgical History:  Procedure Laterality Date  . ANKLE SURGERY Right 02   pin  . BREAST BIOPSY Right 11/01/2013   Procedure: RIGHT BREAST MASS EXCISION;  Surgeon: Rolm Bookbinder, MD;  Location: [place];  Service: General;  Laterality: Right;  . ELBOW SURGERY Right    pin  . MULTIPLE TOOTH EXTRACTIONS    . TUBAL LIGATION      MEDICATIONS: . albuterol (PROVENTIL HFA;VENTOLIN HFA) 108 (90 BASE) MCG/ACT inhaler  . Cholecalciferol (VITAMIN D3) 25 MCG (1000 UT)  CAPS  . Cyanocobalamin (VITAMIN B-12) 5000 MCG TBDP  . diclofenac Sodium (VOLTAREN) 1 % GEL  . lisinopril (ZESTRIL) 40 MG tablet  . magnesium gluconate (MAGONATE) 500 MG tablet  . montelukast (SINGULAIR) 10 MG tablet  . Multiple Vitamins-Minerals (PRESERVISION AREDS 2 PO)  . WIXELA INHUB 250-50 MCG/DOSE AEPB   No current facility-administered medications for this encounter.    Konrad Felix, PA-C [facility] [phone]

## 2020-09-15 MED ORDER — TRANEXAMIC ACID 1000 MG/10ML IV SOLN
2000.0000 mg | INTRAVENOUS | Status: AC
  Filled 2020-09-15: qty 20

## 2020-09-15 MED ORDER — BUPIVACAINE LIPOSOME 1.3 % IJ SUSP
10.0000 mL | Freq: Once | INTRAMUSCULAR | Status: AC
  Filled 2020-09-15: qty 10

## 2020-09-16 ENCOUNTER — Observation Stay (HOSPITAL_COMMUNITY)
Admission: RE | Admit: 2020-09-16 | Discharge: 2020-09-17 | Disposition: A | Discharge status: Home / Self Care | Payer: Medicare Other | Attending: Orthopedic Surgery | Admitting: Orthopedic Surgery

## 2020-09-16 ENCOUNTER — Other Ambulatory Visit: Payer: Self-pay

## 2020-09-16 ENCOUNTER — Ambulatory Visit (HOSPITAL_COMMUNITY): Payer: Medicare Other | Admitting: Registered Nurse

## 2020-09-16 ENCOUNTER — Ambulatory Visit (HOSPITAL_COMMUNITY): Payer: Medicare Other

## 2020-09-16 ENCOUNTER — Ambulatory Visit (HOSPITAL_COMMUNITY): Payer: Medicare Other | Admitting: Physician Assistant

## 2020-09-16 ENCOUNTER — Encounter (HOSPITAL_COMMUNITY)
Admission: RE | Disposition: A | Discharge status: Home / Self Care | Payer: Self-pay | Source: Home / Self Care | Attending: Orthopedic Surgery

## 2020-09-16 ENCOUNTER — Encounter (HOSPITAL_COMMUNITY): Payer: Self-pay | Admitting: Orthopedic Surgery

## 2020-09-16 DIAGNOSIS — Z79899 Other long term (current) drug therapy: Secondary | ICD-10-CM | POA: Insufficient documentation

## 2020-09-16 DIAGNOSIS — R7303 Prediabetes: Secondary | ICD-10-CM | POA: Insufficient documentation

## 2020-09-16 DIAGNOSIS — I10 Essential (primary) hypertension: Secondary | ICD-10-CM | POA: Diagnosis not present

## 2020-09-16 DIAGNOSIS — J45909 Unspecified asthma, uncomplicated: Secondary | ICD-10-CM | POA: Insufficient documentation

## 2020-09-16 DIAGNOSIS — Z20822 Contact with and (suspected) exposure to covid-19: Secondary | ICD-10-CM | POA: Diagnosis not present

## 2020-09-16 DIAGNOSIS — Z419 Encounter for procedure for purposes other than remedying health state, unspecified: Secondary | ICD-10-CM

## 2020-09-16 DIAGNOSIS — Z96649 Presence of unspecified artificial hip joint: Secondary | ICD-10-CM

## 2020-09-16 DIAGNOSIS — M25551 Pain in right hip: Secondary | ICD-10-CM | POA: Diagnosis present

## 2020-09-16 DIAGNOSIS — Z96641 Presence of right artificial hip joint: Secondary | ICD-10-CM

## 2020-09-16 DIAGNOSIS — M1611 Unilateral primary osteoarthritis, right hip: Secondary | ICD-10-CM | POA: Diagnosis not present

## 2020-09-16 HISTORY — PX: TOTAL HIP ARTHROPLASTY: SHX124

## 2020-09-16 LAB — RESPIRATORY PANEL BY RT PCR (FLU A&B, COVID)
Influenza A by PCR: NEGATIVE
Influenza B by PCR: NEGATIVE
SARS Coronavirus 2 by RT PCR: NEGATIVE

## 2020-09-16 LAB — ABO/RH: ABO/RH(D): O POS

## 2020-09-16 LAB — TYPE AND SCREEN
ABO/RH(D): O POS
Antibody Screen: NEGATIVE

## 2020-09-16 SURGERY — ARTHROPLASTY, HIP, TOTAL, ANTERIOR APPROACH
Anesthesia: Spinal | Site: Hip | Laterality: Right

## 2020-09-16 MED ORDER — LACTATED RINGERS IV BOLUS
250.0000 mL | Freq: Once | INTRAVENOUS | Status: AC
  Administered 2020-09-16: 250 mL via INTRAVENOUS

## 2020-09-16 MED ORDER — PROPOFOL 500 MG/50ML IV EMUL
INTRAVENOUS | Status: DC | PRN
  Administered 2020-09-16: 40 ug/kg/min via INTRAVENOUS

## 2020-09-16 MED ORDER — DEXAMETHASONE SODIUM PHOSPHATE 10 MG/ML IJ SOLN
INTRAMUSCULAR | Status: DC | PRN
  Administered 2020-09-16: 5 mg via INTRAVENOUS

## 2020-09-16 MED ORDER — ACETAMINOPHEN 325 MG PO TABS: 325.0000 mg | ORAL_TABLET | Freq: Four times a day (QID) | ORAL | Status: DC | PRN

## 2020-09-16 MED ORDER — ONDANSETRON HCL 4 MG/2ML IJ SOLN
INTRAMUSCULAR | Status: AC
  Administered 2020-09-16: 4 mg via INTRAVENOUS
  Filled 2020-09-16: qty 2

## 2020-09-16 MED ORDER — PHENYLEPHRINE HCL (PRESSORS) 10 MG/ML IV SOLN
INTRAVENOUS | Status: AC
  Filled 2020-09-16: qty 2

## 2020-09-16 MED ORDER — ALBUTEROL SULFATE HFA 108 (90 BASE) MCG/ACT IN AERS: 2.0000 | INHALATION_SPRAY | Freq: Four times a day (QID) | RESPIRATORY_TRACT | Status: DC | PRN

## 2020-09-16 MED ORDER — BUPIVACAINE-EPINEPHRINE 0.25% -1:200000 IJ SOLN
INTRAMUSCULAR | Status: DC | PRN
  Administered 2020-09-16: 30 mL

## 2020-09-16 MED ORDER — KCL IN DEXTROSE-NACL 20-5-0.45 MEQ/L-%-% IV SOLN
INTRAVENOUS | Status: DC
  Filled 2020-09-16 (×3): qty 1000

## 2020-09-16 MED ORDER — METHOCARBAMOL 500 MG PO TABS: 500.0000 mg | ORAL_TABLET | Freq: Four times a day (QID) | ORAL | Status: DC | PRN

## 2020-09-16 MED ORDER — OXYCODONE HCL 5 MG PO TABS
10.0000 mg | ORAL_TABLET | ORAL | Status: DC | PRN
  Administered 2020-09-16 – 2020-09-17 (×2): 10 mg via ORAL
  Filled 2020-09-16 (×2): qty 2

## 2020-09-16 MED ORDER — PROPOFOL 1000 MG/100ML IV EMUL
INTRAVENOUS | Status: AC
  Filled 2020-09-16: qty 100

## 2020-09-16 MED ORDER — OXYCODONE HCL 5 MG PO TABS
5.0000 mg | ORAL_TABLET | ORAL | Status: DC | PRN
  Administered 2020-09-17 (×2): 10 mg via ORAL
  Filled 2020-09-16: qty 2

## 2020-09-16 MED ORDER — PHENYLEPHRINE HCL-NACL 20-0.9 MG/250ML-% IV SOLN
INTRAVENOUS | Status: DC | PRN
  Administered 2020-09-16: 20 ug/min via INTRAVENOUS

## 2020-09-16 MED ORDER — 0.9 % SODIUM CHLORIDE (POUR BTL) OPTIME
TOPICAL | Status: DC | PRN
  Administered 2020-09-16: 1000 mL

## 2020-09-16 MED ORDER — ASPIRIN 81 MG PO CHEW
81.0000 mg | CHEWABLE_TABLET | Freq: Two times a day (BID) | ORAL | Status: DC
  Administered 2020-09-16 – 2020-09-17 (×2): 81 mg via ORAL
  Filled 2020-09-16 (×2): qty 1

## 2020-09-16 MED ORDER — DOCUSATE SODIUM 100 MG PO CAPS
100.0000 mg | ORAL_CAPSULE | Freq: Two times a day (BID) | ORAL | Status: DC
  Administered 2020-09-16 – 2020-09-17 (×2): 100 mg via ORAL
  Filled 2020-09-16 (×2): qty 1

## 2020-09-16 MED ORDER — BUPIVACAINE LIPOSOME 1.3 % IJ SUSP
INTRAMUSCULAR | Status: DC | PRN
  Administered 2020-09-16: 10 mL

## 2020-09-16 MED ORDER — BUPIVACAINE-EPINEPHRINE (PF) 0.25% -1:200000 IJ SOLN
INTRAMUSCULAR | Status: AC
  Filled 2020-09-16: qty 30

## 2020-09-16 MED ORDER — HYDROMORPHONE HCL 1 MG/ML IJ SOLN
INTRAMUSCULAR | Status: AC
  Administered 2020-09-16: 0.25 mg via INTRAVENOUS
  Filled 2020-09-16: qty 1

## 2020-09-16 MED ORDER — FENTANYL CITRATE (PF) 100 MCG/2ML IJ SOLN
INTRAMUSCULAR | Status: DC | PRN
Start: 2020-09-16 — End: 2020-09-16
  Administered 2020-09-16: 25 ug via INTRAVENOUS

## 2020-09-16 MED ORDER — BISACODYL 5 MG PO TBEC: 5.0000 mg | DELAYED_RELEASE_TABLET | Freq: Every day | ORAL | Status: DC | PRN

## 2020-09-16 MED ORDER — MONTELUKAST SODIUM 10 MG PO TABS
10.0000 mg | ORAL_TABLET | Freq: Every day | ORAL | Status: DC
  Administered 2020-09-17: 10 mg via ORAL
  Filled 2020-09-16: qty 1

## 2020-09-16 MED ORDER — ONDANSETRON HCL 4 MG/2ML IJ SOLN: 4.0000 mg | Freq: Once | INTRAMUSCULAR | Status: AC | PRN

## 2020-09-16 MED ORDER — OXYCODONE-ACETAMINOPHEN 5-325 MG PO TABS: 1.0000 | ORAL_TABLET | ORAL | 0 refills | Status: DC | PRN

## 2020-09-16 MED ORDER — ONDANSETRON HCL 4 MG PO TABS
4.0000 mg | ORAL_TABLET | Freq: Four times a day (QID) | ORAL | Status: DC | PRN
  Filled 2020-09-16: qty 1

## 2020-09-16 MED ORDER — ONDANSETRON HCL 4 MG/2ML IJ SOLN
4.0000 mg | Freq: Four times a day (QID) | INTRAMUSCULAR | Status: DC | PRN
  Administered 2020-09-16 (×2): 4 mg via INTRAVENOUS
  Filled 2020-09-16: qty 2

## 2020-09-16 MED ORDER — ALUM & MAG HYDROXIDE-SIMETH 200-200-20 MG/5ML PO SUSP: 30.0000 mL | ORAL | Status: DC | PRN

## 2020-09-16 MED ORDER — OXYCODONE HCL 5 MG PO TABS
ORAL_TABLET | ORAL | Status: AC
  Filled 2020-09-16: qty 2

## 2020-09-16 MED ORDER — PHENYLEPHRINE 40 MCG/ML (10ML) SYRINGE FOR IV PUSH (FOR BLOOD PRESSURE SUPPORT)
PREFILLED_SYRINGE | INTRAVENOUS | Status: DC | PRN
  Administered 2020-09-16: 80 ug via INTRAVENOUS
  Administered 2020-09-16: 40 ug via INTRAVENOUS

## 2020-09-16 MED ORDER — LACTATED RINGERS IV SOLN: INTRAVENOUS | Status: DC

## 2020-09-16 MED ORDER — HYDROMORPHONE HCL 1 MG/ML IJ SOLN
0.2500 mg | INTRAMUSCULAR | Status: DC | PRN
  Administered 2020-09-16 (×2): 0.25 mg via INTRAVENOUS

## 2020-09-16 MED ORDER — PHENOL 1.4 % MT LIQD: 1.0000 | OROMUCOSAL | Status: DC | PRN

## 2020-09-16 MED ORDER — BUPIVACAINE IN DEXTROSE 0.75-8.25 % IT SOLN
INTRATHECAL | Status: DC | PRN
  Administered 2020-09-16: 1.7 mL via INTRATHECAL

## 2020-09-16 MED ORDER — ASPIRIN EC 81 MG PO TBEC: 81.0000 mg | DELAYED_RELEASE_TABLET | Freq: Two times a day (BID) | ORAL | 0 refills | Status: AC

## 2020-09-16 MED ORDER — DIPHENHYDRAMINE HCL 12.5 MG/5ML PO ELIX: 12.5000 mg | ORAL_SOLUTION | ORAL | Status: DC | PRN

## 2020-09-16 MED ORDER — TRANEXAMIC ACID-NACL 1000-0.7 MG/100ML-% IV SOLN
1000.0000 mg | INTRAVENOUS | Status: AC
  Administered 2020-09-16: 1000 mg via INTRAVENOUS
  Filled 2020-09-16: qty 100

## 2020-09-16 MED ORDER — METHOCARBAMOL 500 MG IVPB - SIMPLE MED
INTRAVENOUS | Status: AC
  Administered 2020-09-16: 500 mg via INTRAVENOUS
  Filled 2020-09-16: qty 50

## 2020-09-16 MED ORDER — MOMETASONE FURO-FORMOTEROL FUM 200-5 MCG/ACT IN AERO
2.0000 | INHALATION_SPRAY | Freq: Two times a day (BID) | RESPIRATORY_TRACT | Status: DC
  Filled 2020-09-16: qty 8.8

## 2020-09-16 MED ORDER — HYDROMORPHONE HCL 1 MG/ML IJ SOLN
INTRAMUSCULAR | Status: AC
  Filled 2020-09-16: qty 1

## 2020-09-16 MED ORDER — ONDANSETRON HCL 4 MG/2ML IJ SOLN
INTRAMUSCULAR | Status: DC | PRN
  Administered 2020-09-16: 4 mg via INTRAVENOUS

## 2020-09-16 MED ORDER — SODIUM CHLORIDE 0.9% FLUSH
INTRAVENOUS | Status: DC | PRN
  Administered 2020-09-16: 50 mL

## 2020-09-16 MED ORDER — ONDANSETRON HCL 4 MG/2ML IJ SOLN
INTRAMUSCULAR | Status: AC
  Filled 2020-09-16: qty 2

## 2020-09-16 MED ORDER — ORAL CARE MOUTH RINSE: 15.0000 mL | Freq: Once | OROMUCOSAL | Status: AC

## 2020-09-16 MED ORDER — POVIDONE-IODINE 10 % EX SWAB
2.0000 "application " | Freq: Once | CUTANEOUS | Status: AC
  Administered 2020-09-16: 2 via TOPICAL

## 2020-09-16 MED ORDER — VANCOMYCIN HCL IN DEXTROSE 1-5 GM/200ML-% IV SOLN
1000.0000 mg | INTRAVENOUS | Status: AC
  Administered 2020-09-16: 1000 mg via INTRAVENOUS
  Filled 2020-09-16: qty 200

## 2020-09-16 MED ORDER — FLEET ENEMA 7-19 GM/118ML RE ENEM: 1.0000 | ENEMA | Freq: Once | RECTAL | Status: DC | PRN

## 2020-09-16 MED ORDER — METOCLOPRAMIDE HCL 5 MG/ML IJ SOLN: 5.0000 mg | Freq: Three times a day (TID) | INTRAMUSCULAR | Status: DC | PRN

## 2020-09-16 MED ORDER — METOCLOPRAMIDE HCL 5 MG PO TABS
5.0000 mg | ORAL_TABLET | Freq: Three times a day (TID) | ORAL | Status: DC | PRN
  Filled 2020-09-16: qty 2

## 2020-09-16 MED ORDER — LISINOPRIL 20 MG PO TABS
40.0000 mg | ORAL_TABLET | Freq: Every day | ORAL | Status: DC
  Administered 2020-09-17: 40 mg via ORAL
  Filled 2020-09-16: qty 2

## 2020-09-16 MED ORDER — TIZANIDINE HCL 2 MG PO TABS: 2.0000 mg | ORAL_TABLET | Freq: Four times a day (QID) | ORAL | 0 refills | Status: DC | PRN

## 2020-09-16 MED ORDER — DEXAMETHASONE SODIUM PHOSPHATE 10 MG/ML IJ SOLN
10.0000 mg | Freq: Once | INTRAMUSCULAR | Status: AC
  Administered 2020-09-17: 10 mg via INTRAVENOUS
  Filled 2020-09-16: qty 1

## 2020-09-16 MED ORDER — POLYETHYLENE GLYCOL 3350 17 G PO PACK: 17.0000 g | PACK | Freq: Every day | ORAL | Status: DC | PRN

## 2020-09-16 MED ORDER — DEXAMETHASONE SODIUM PHOSPHATE 10 MG/ML IJ SOLN
INTRAMUSCULAR | Status: AC
  Filled 2020-09-16: qty 1

## 2020-09-16 MED ORDER — SODIUM CHLORIDE (PF) 0.9 % IJ SOLN
INTRAMUSCULAR | Status: AC
  Filled 2020-09-16: qty 50

## 2020-09-16 MED ORDER — CHLORHEXIDINE GLUCONATE 0.12 % MT SOLN
15.0000 mL | Freq: Once | OROMUCOSAL | Status: AC
  Administered 2020-09-16: 15 mL via OROMUCOSAL

## 2020-09-16 MED ORDER — HYDROMORPHONE HCL 1 MG/ML IJ SOLN
0.5000 mg | INTRAMUSCULAR | Status: DC | PRN
  Administered 2020-09-16: 1 mg via INTRAVENOUS
  Filled 2020-09-16: qty 1

## 2020-09-16 MED ORDER — FENTANYL CITRATE (PF) 100 MCG/2ML IJ SOLN
INTRAMUSCULAR | Status: AC
  Filled 2020-09-16: qty 2

## 2020-09-16 MED ORDER — SODIUM CHLORIDE 0.9 % IV BOLUS
500.0000 mL | Freq: Once | INTRAVENOUS | Status: DC
  Administered 2020-09-16: 500 mL via INTRAVENOUS

## 2020-09-16 MED ORDER — LACTATED RINGERS IV BOLUS
500.0000 mL | Freq: Once | INTRAVENOUS | Status: AC
  Administered 2020-09-16: 500 mL via INTRAVENOUS

## 2020-09-16 MED ORDER — MENTHOL 3 MG MT LOZG: 1.0000 | LOZENGE | OROMUCOSAL | Status: DC | PRN

## 2020-09-16 MED ORDER — METHOCARBAMOL 500 MG IVPB - SIMPLE MED
500.0000 mg | Freq: Four times a day (QID) | INTRAVENOUS | Status: DC | PRN
  Filled 2020-09-16: qty 50

## 2020-09-16 MED ORDER — TRANEXAMIC ACID 1000 MG/10ML IV SOLN
INTRAVENOUS | Status: DC | PRN
  Administered 2020-09-16: 2000 mg via TOPICAL

## 2020-09-16 MED ORDER — PANTOPRAZOLE SODIUM 40 MG PO TBEC
40.0000 mg | DELAYED_RELEASE_TABLET | Freq: Every day | ORAL | Status: DC
  Administered 2020-09-17: 40 mg via ORAL
  Filled 2020-09-16: qty 1

## 2020-09-16 SURGICAL SUPPLY — 52 items
BAG DECANTER FOR FLEXI CONT (MISCELLANEOUS) ×3 IMPLANT
BLADE SAW SGTL 18X1.27X75 (BLADE) ×2 IMPLANT
BLADE SAW SGTL 18X1.27X75MM (BLADE) ×1
BLADE SURG SZ10 CARB STEEL (BLADE) ×6 IMPLANT
CNTNR URN SCR LID CUP LEK RST (MISCELLANEOUS) ×1 IMPLANT
CONT SPEC 4OZ STRL OR WHT (MISCELLANEOUS) ×3
COVER PERINEAL POST (MISCELLANEOUS) ×3 IMPLANT
COVER SURGICAL LIGHT HANDLE (MISCELLANEOUS) ×3 IMPLANT
COVER WAND RF STERILE (DRAPES) ×3 IMPLANT
CUP ACETBLR 52 OD 100 SERIES (Hips) ×2 IMPLANT
DECANTER SPIKE VIAL GLASS SM (MISCELLANEOUS) ×6 IMPLANT
DRAPE ORTHO SPLIT 77X108 STRL (DRAPES)
DRAPE STERI IOBAN 125X83 (DRAPES) ×3 IMPLANT
DRAPE SURG ORHT 6 SPLT 77X108 (DRAPES) IMPLANT
DRAPE U-SHAPE 47X51 STRL (DRAPES) ×6 IMPLANT
DRESSING AQUACEL AG SP 3.5X10 (GAUZE/BANDAGES/DRESSINGS) IMPLANT
DRSG AQUACEL AG ADV 3.5X10 (GAUZE/BANDAGES/DRESSINGS) ×3 IMPLANT
DRSG AQUACEL AG SP 3.5X10 (GAUZE/BANDAGES/DRESSINGS) ×3
DURAPREP 26ML APPLICATOR (WOUND CARE) ×3 IMPLANT
ELECT BLADE TIP CTD 4 INCH (ELECTRODE) ×3 IMPLANT
ELECT REM PT RETURN 15FT ADLT (MISCELLANEOUS) ×3 IMPLANT
ELIMINATOR HOLE APEX DEPUY (Hips) ×2 IMPLANT
GLOVE BIO SURGEON STRL SZ7.5 (GLOVE) ×3 IMPLANT
GLOVE BIO SURGEON STRL SZ8.5 (GLOVE) ×3 IMPLANT
GLOVE BIOGEL PI IND STRL 8 (GLOVE) ×1 IMPLANT
GLOVE BIOGEL PI IND STRL 9 (GLOVE) ×1 IMPLANT
GLOVE BIOGEL PI INDICATOR 8 (GLOVE) ×2
GLOVE BIOGEL PI INDICATOR 9 (GLOVE) ×2
GOWN STRL REUS W/TWL XL LVL3 (GOWN DISPOSABLE) ×6 IMPLANT
HEAD M SROM 36MM PLUS 1.5 (Hips) IMPLANT
HOLDER FOLEY CATH W/STRAP (MISCELLANEOUS) ×3 IMPLANT
KIT TURNOVER KIT A (KITS) IMPLANT
LINER NEUTRAL 52X36MM PLUS 4 (Liner) ×2 IMPLANT
MANIFOLD NEPTUNE II (INSTRUMENTS) ×3 IMPLANT
NDL HYPO 21X1.5 SAFETY (NEEDLE) ×2 IMPLANT
NEEDLE HYPO 21X1.5 SAFETY (NEEDLE) ×6 IMPLANT
NS IRRIG 1000ML POUR BTL (IV SOLUTION) ×3 IMPLANT
PACK ANTERIOR HIP CUSTOM (KITS) ×3 IMPLANT
PENCIL SMOKE EVACUATOR (MISCELLANEOUS) IMPLANT
SROM M HEAD 36MM PLUS 1.5 (Hips) ×3 IMPLANT
STEM FEM ACTIS STD SZ7 (Nail) ×2 IMPLANT
SUT ETHIBOND NAB CT1 #1 30IN (SUTURE) ×3 IMPLANT
SUT VIC AB 0 CT1 27 (SUTURE)
SUT VIC AB 0 CT1 27XBRD ANBCTR (SUTURE) IMPLANT
SUT VIC AB 1 CTX 36 (SUTURE) ×3
SUT VIC AB 1 CTX36XBRD ANBCTR (SUTURE) ×1 IMPLANT
SUT VIC AB 2-0 CT1 27 (SUTURE)
SUT VIC AB 2-0 CT1 TAPERPNT 27 (SUTURE) IMPLANT
SUT VIC AB 3-0 CT1 27 (SUTURE) ×3
SUT VIC AB 3-0 CT1 TAPERPNT 27 (SUTURE) ×1 IMPLANT
SYR CONTROL 10ML LL (SYRINGE) ×9 IMPLANT
TRAY FOLEY MTR SLVR 16FR STAT (SET/KITS/TRAYS/PACK) IMPLANT

## 2020-09-16 NOTE — Discharge Instructions (Signed)

## 2020-09-16 NOTE — Progress Notes (Signed)
Patient seen by PT for follow up session in Phase 2 of PACU.  VS from PT note:  Vitals (Prior to supine<>sit pt BP running 120's-140's/80's) Time     BP         Position 1554     85/59     Sitting 1557     89/62     Sitting 1600     84/62     Supine  Notified Joanell Rising, [place] with Dr. Mayer Camel and explained patient's hypotensive episodes upon trying to stand. Orders received for patient to have 500 NS bolus and to stay the night in the hospital. Patient and family notified and agreeable to stay. BP 163/92 after NS bolus.  Floreen Comber, RN

## 2020-09-16 NOTE — Transfer of Care (Signed)
Immediate Anesthesia Transfer of Care Note  Patient: Mary Chase  Procedure(s) Performed: RIGHT TOTAL HIP ARTHROPLASTY ANTERIOR APPROACH (Right Hip)  Patient Location: PACU  Anesthesia Type:MAC and Spinal  Level of Consciousness: awake, alert , oriented and patient cooperative  Airway & Oxygen Therapy: Patient Spontanous Breathing and Patient connected to face mask oxygen  Post-op Assessment: Report given to RN and Post -op Vital signs reviewed and stable  Post vital signs: Reviewed and stable  Last Vitals:  Vitals Value Taken Time  BP 116/71 09/16/20 1116  Temp    Pulse 73 09/16/20 1118  Resp 15 09/16/20 1118  SpO2 96 % 09/16/20 1118  Vitals shown include unvalidated device data.  Last Pain:  Vitals:   09/16/20 0717  TempSrc:   PainSc: 4          Complications: No complications documented.

## 2020-09-16 NOTE — Anesthesia Postprocedure Evaluation (Signed)
Anesthesia Post Note  Patient: Mary Chase  Procedure(s) Performed: RIGHT TOTAL HIP ARTHROPLASTY ANTERIOR APPROACH (Right Hip)     Patient location during evaluation: PACU Anesthesia Type: Spinal Level of consciousness: awake and alert Pain management: pain level controlled Vital Signs Assessment: post-procedure vital signs reviewed and stable Respiratory status: spontaneous breathing Cardiovascular status: stable Anesthetic complications: no   No complications documented.  Last Vitals:  Vitals:   09/16/20 1630 09/16/20 1652  BP: (!) 163/92 (!) 152/106  Pulse: 78 98  Resp: 15 19  Temp: 36.4 C 36.8 C  SpO2: 98% 98%    Last Pain:  Vitals:   09/16/20 1652  TempSrc: Oral  PainSc: [place]

## 2020-09-16 NOTE — Op Note (Signed)
PATIENT ID:      Mary Chase  MRN:     [id number] DOB/AGE:    83-Jan-1936 / 83 y.o.  OPERATIVE REPORT   DATE OF PROCEDURE:  09/16/2020      PREOPERATIVE DIAGNOSIS:  RIGHT HIP OSTEOARTHRITIS                                                         POSTOPERATIVE DIAGNOSIS:  Same                                                         PROCEDURE: Anterior R total hip arthroplasty using a 52 mm DePuy Pinnacle  Cup, [employer], 0-degree polyethylene liner, a +1.5 mm x 35mm metal head, a 7 std Depuy Actis stem  SURGEON: Kerin Salen  ASSISTANT:   Kerry Hough. [employer]  (present throughout entire procedure and necessary for timely completion of the procedure)   ANESTHESIA: Spinal, Exparel 133mg  injection BLOOD LOSS: 300 cc FLUID REPLACEMENT: 1500 cc crystalloid TRANEXAMIC ACID: 1gm IV, 2gm Topical COMPLICATIONS: none    INDICATIONS FOR PROCEDURE: A 85 y.o. year-old With  RIGHT HIP OSTEOARTHRITIS   for 5 years, x-rays show bone-on-bone arthritic changes, and osteophytes. Despite conservative measures with observation, anti-inflammatory medicine, narcotics, use of a cane, has severe unremitting pain and can ambulate only a few blocks before resting. Patient desires elective R total hip arthroplasty to decrease pain and increase function. The risks, benefits, and alternatives were discussed at length including but not limited to the risks of infection, bleeding, nerve injury, stiffness, blood clots, the need for revision surgery, cardiopulmonary complications, among others, and they were willing to proceed. Questions answered      PROCEDURE IN DETAIL: The patient was identified by armband,   received preoperative IV antibiotics in the holding area at [facility], taken to the operating room , appropriate anesthetic monitors   were attached and anesthesia was induced with the patient on the gurney. HANA boots were applied to the feet, and the patient  was transferred to the HANA  table with a peroneal post and support underneath the non-operative leg. Theoperative lower extremity was then prepped and draped in the usual sterile fashion from just above the iliac crest to the knee. And a timeout procedure was performed. Kerry Hough. [facility] was present and scrubbed throughout the case, critical for assistance with, positioning, exposure, retraction, instrumentation, and closure.Skin along incision area was injected with 10 cc of Exparel solution. We then made a 13 cm incision along the interval at the leading edge of the tensor fascia lata of starting at 2 cm lateral to the ASIS. Small bleeders in the skin and subcutaneous tissue identified and cauterized we dissected down to the fascia and made an incision in the fascia allowing [place] to elevate the fascia of the tensor muscle and exploited the interval between the rectus and the tensor fascia lata. A Cobra retractor was then placed along the superior neck of the femur. A cerebellar retractor was used to expose the interval between the tensor fascia lata and the rectus femoris.  We identified  and cauterized the ascending branch of the anterior circumflex artery. A second Cobra retractor along the inferior neck of the femur. A small Hohmann retractor was placed underneath the origin of the rectus femoris, giving [place] good medial exposure. Using Ronguers fatty tissue was removed from in front of the anterior capsule. The capsule was then incised, starting out at the superior anterior rim of the acetabulum going laterally along the anterior neck. The capsule was then teed along the neck superiorly and inferiorly. Electrocautery was used to release capsule from the anterior and medial neck of the femur to allow external rotation. Cobra retractors were then placed along the inferior and superior neck allowing [place] to perform a standard neck cut and removed the femoral head with a power corkscrew. We then placed a medium bent homan retractor in the  cotyloid notch and posteriorly along the acetabular rim a narrow Cobra retractor. Exposed labral tissue and osteophytes were then removed. We then sequentially reamed up to a 52 mm basket reamer obtaining good coverage in all quadrants, verified by C-arm imaging. Under C-arm control we then hammered into place a 52 mm Pinnacle cup in 45 of abduction and 15 of anteversion. The cup seated nicely and required no supplemental screws. We then placed a central hole Eliminator and a 0 polyethylene liner. The foot was then externally rotated to 130-140. The limb was extended and adducted to the floor, delivering the proximal femur up into the wound. A medium curved Hohmann retractor was placed over the greater trochanter and a long Homan retractor along the posterior femoral neck completing the exposure and lateralizing the femur. We then performed releases superiorly and and inferiorly of the capsule going back to the pirformis fossa superiorly and to the lesser trochanter inferiorly. We then entered the proximal femur with the box cutting offset chisel followed by, a canal sounder, the chili pepper and broaching up to a 7 broach. This seated nicely and we reamed the calcar. A trial reduction was performed with a 1.5 mm X 36 mm head.The limb lengths were excellent the hip was stable in 90 of external rotation. At this point the trial components removed and we hammered into place a # 7 std  Offset Actis stem with Gryption coating. A + 1.5 mm x 35 metal head was then hammered into place. The hip was reduced and final C-arm images obtained. The wound was thoroughly irrigated with normal saline solution. We repaired the ant capsule and the tensor fascia lot a with running 0 vicryl suture. the subcutaneous tissue was closed with 2-0 and 3-0 Vicryl suture followed by an Aquacil dressing. At this point the patient was awaken and transferred to hospital gurney without difficulty.   Kerin Salen 09/16/2020, 7:11 AM

## 2020-09-16 NOTE — Evaluation (Signed)
Physical Therapy Evaluation Patient Details Name: Mary Chase MRN: [id number] DOB: 03/03/1935 Today's Date: 09/16/2020   History of Present Illness  Patient is 85 y.o. female s/p Rt THA anterior approach on 09/16/20 with PMH significant for HTN, GERD, COPD, OA, sickle cell anemia.    Clinical Impression  Mary Chase is a 85 y.o. female POD 0 s/p Rt THA. Patient reports independence with mobility at baseline. Patient is now limited by functional impairments (see PT problem list below) and requires min assist for transfers and gait with RW. Patient was able to ambulate ~20 feet with RW and min assist to steady and prevent LOB. Patient limited by hip weakness secondary to spinal block and orthostatic hypotension. Min assist to return pt to supine due to "weak" sensation. Patient will benefit from continued skilled PT interventions to address impairments and progress towards PLOF. Acute PT will follow to progress mobility and stair training in preparation for safe discharge home.      Follow Up Recommendations Follow surgeon's recommendation for DC plan and follow-up therapies;Home health PT    Equipment Recommendations  Rolling walker with 5" wheels    Recommendations for Other Services       Precautions / Restrictions Precautions Precautions: Fall Restrictions Weight Bearing Restrictions: No Other Position/Activity Restrictions: WBAT      Mobility  Bed Mobility Overal bed mobility: Needs Assistance Bed Mobility: Supine to Sit;Sit to Supine     Supine to sit: Min guard Sit to supine: Min assist   General bed mobility comments: pt taking extra time to raise trunk and bring LE off EOB. Patient required assist   Transfers Overall transfer level: Needs assistance Equipment used: Rolling walker (2 wheeled) Transfers: Sit to/from Stand Sit to Stand: Min assist         General transfer comment: cues for safe hand placement and technique with RW, min guard to rise and  assist to steady once standing.  Ambulation/Gait Ambulation/Gait assistance: Min assist Gait Distance (Feet): 20 Feet Assistive device: Rolling walker (2 wheeled) Gait Pattern/deviations: Step-to pattern;Decreased stride length;Decreased step length - left;Decreased step length - right;Decreased weight shift to right Gait velocity: decr   General Gait Details: cues for safe step pattern and proximity to RW. pt unsteady requiring Min assist for to steady and prevent LOB. (suspect pts hips weak due to spinal block)  Stairs            Wheelchair Mobility    Modified Rankin (Stroke Patients Only)       Balance Overall balance assessment: Needs assistance Sitting-balance support: Feet supported Sitting balance-Leahy Scale: Good     Standing balance support: During functional activity;Bilateral upper extremity supported Standing balance-Leahy Scale: Poor                               Pertinent Vitals/Pain Pain Assessment: Faces Faces Pain Scale: Hurts little more Pain Location: Rt hip Pain Descriptors / Indicators: Aching;Discomfort Pain Intervention(s): Limited activity within patient's tolerance;Repositioned;Monitored during session    Home Living Family/patient expects to be discharged to:: Private residence Living Arrangements: Children Available Help at Discharge: Family Type of Home: House Home Access: Stairs to enter Entrance Stairs-Rails: None Entrance Stairs-Number of Steps: 2 Home Layout: One level Home Equipment: Cane - single point;Walker - 4 wheels;Walker - 2 wheels;Shower seat Additional Comments: pt lives with her son but is staying with her daugher for about a week; home envornment reflects her daughters  home    Prior Function Level of Independence: Independent with assistive device(s)         Comments: pt ambulates with Rollator primarily and uses SPC for stairs.     Hand Dominance   Dominant Hand: Right    Extremity/Trunk  Assessment   Upper Extremity Assessment Upper Extremity Assessment: Overall WFL for tasks assessed    Lower Extremity Assessment Lower Extremity Assessment: Overall WFL for tasks assessed    Cervical / Trunk Assessment Cervical / Trunk Assessment: Normal  Communication   Communication: No difficulties  Cognition Arousal/Alertness: Awake/alert Behavior During Therapy: WFL for tasks assessed/performed Overall Cognitive Status: Within Functional Limits for tasks assessed                                        General Comments      Exercises     Assessment/Plan    PT Assessment Patient needs continued PT services  PT Problem List Decreased strength;Decreased range of motion;Decreased activity tolerance;Decreased balance;Decreased mobility;Decreased knowledge of use of DME;Decreased knowledge of precautions       PT Treatment Interventions DME instruction;Gait training;Functional mobility training;Stair training;Therapeutic activities;Therapeutic exercise;Balance training;Patient/family education    PT Goals (Current goals can be found in the Care Plan section)  Acute Rehab PT Goals Patient Stated Goal: go home today PT Goal Formulation: With patient Time For Goal Achievement: 09/23/20 Potential to Achieve Goals: Good    Frequency 7X/week   Barriers to discharge        Co-evaluation               AM-PAC PT "6 Clicks" Mobility  Outcome Measure Help needed turning from your back to your side while in a flat bed without using bedrails?: None Help needed moving from lying on your back to sitting on the side of a flat bed without using bedrails?: A Little Help needed moving to and from a bed to a chair (including a wheelchair)?: A Little Help needed standing up from a chair using your arms (e.g., wheelchair or bedside chair)?: A Little Help needed to walk in hospital room?: A Little Help needed climbing 3-5 steps with a railing? : A Little 6 Click  Score: 19    End of Session Equipment Utilized During Treatment: Gait belt Activity Tolerance: Treatment limited secondary to medical complications (Comment) (orthostatic) Patient left: in bed;with call bell/phone within reach Nurse Communication: Mobility status PT Visit Diagnosis: Other abnormalities of gait and mobility (R26.89);Difficulty in walking, not elsewhere classified (R26.2);Muscle weakness (generalized) (M62.81)    Time: 9357-0177 PT Time Calculation (min) (ACUTE ONLY): 21 min   Charges:   PT Evaluation $PT Eval Low Complexity: 1 Low         Verner Mould, DPT Acute Rehabilitation Services  Office [phone] Pager [phone]  09/16/2020 3:47 PM

## 2020-09-16 NOTE — Interval H&P Note (Signed)
History and Physical Interval Note:  09/16/2020 7:10 AM  Mary Chase  has presented today for surgery, with the diagnosis of RIGHT HIP OSTEOARTHRITIS.  The various methods of treatment have been discussed with the patient and family. After consideration of risks, benefits and other options for treatment, the patient has consented to  Procedure(s): RIGHT TOTAL HIP ARTHROPLASTY ANTERIOR APPROACH (Right) as a surgical intervention.  The patient's history has been reviewed, patient examined, no change in status, stable for surgery.  I have reviewed the patient's chart and labs.  Questions were answered to the patient's satisfaction.     Kerin Salen

## 2020-09-16 NOTE — Anesthesia Preprocedure Evaluation (Addendum)
Anesthesia Evaluation  Patient identified by MRN, date of birth, ID band Patient awake    Reviewed: Allergy & Precautions, H&P , NPO status , Patient's Chart, lab work & pertinent test results  History of Anesthesia Complications (+) history of anesthetic complications  Airway Mallampati: I   Neck ROM: Full    Dental  (+) Edentulous Upper, Edentulous Lower   Pulmonary shortness of breath, asthma , pneumonia, COPD,    breath sounds clear to auscultation       Cardiovascular hypertension, Pt. on medications + DOE  (-) Orthopnea  Rhythm:Regular Rate:Normal + Systolic murmurs    Neuro/Psych  Headaches,    GI/Hepatic GERD  ,  Endo/Other  diabetes  Renal/GU      Musculoskeletal  (+) Arthritis ,   Abdominal   Peds  Hematology  (+) Sickle cell trait and anemia ,   Anesthesia Other Findings   Reproductive/Obstetrics                            Anesthesia Physical  Anesthesia Plan  ASA: III  Anesthesia Plan: Spinal   Post-op Pain Management:    Induction: Intravenous  PONV Risk Score and Plan: 3 and Propofol infusion, TIVA, Treatment may vary due to age or medical condition and Ondansetron  Airway Management Planned: Natural Airway  Additional Equipment: None  Intra-op Plan:   Post-operative Plan:   Informed Consent: I have reviewed the patients History and Physical, chart, labs and discussed the procedure including the risks, benefits and alternatives for the proposed anesthesia with the patient or authorized representative who has indicated his/her understanding and acceptance.     Dental advisory given  Plan Discussed with: CRNA  Anesthesia Plan Comments:         Anesthesia Quick Evaluation

## 2020-09-16 NOTE — Anesthesia Procedure Notes (Signed)
Spinal  Start time: 09/16/2020 9:29 AM End time: 09/16/2020 9:33 AM Staffing Resident/CRNA: Victoriano Lain, CRNA Preanesthetic Checklist Completed: patient identified, IV checked, site marked, risks and benefits discussed, surgical consent, monitors and equipment checked, pre-op evaluation and timeout performed Spinal Block Patient position: sitting Prep: DuraPrep and site prepped and draped Patient monitoring: heart rate, continuous pulse ox and blood pressure Approach: midline Location: L3-4 Injection technique: single-shot Needle Needle type: Pencan  Needle gauge: 24 G Needle length: 10 cm Assessment Sensory level: T4 Additional Notes Pt placed in sitting position for spinal placement. Monitors on , O2, and sedation for pt comfort. Spinal kit expiration date checked and verified. One attempt. + clear CSF, - heme. Pt tolerated well.

## 2020-09-16 NOTE — Progress Notes (Signed)
Physical Therapy Treatment Patient Details Name: Mary Chase MRN: [id number] DOB: 1935/08/29 Today's Date: 09/16/2020    History of Present Illness Patient is 85 y.o. female s/p Rt THA anterior approach on 09/16/20 with PMH significant for HTN, GERD, COPD, OA, sickle cell anemia.    PT Comments    Patient seen for follow up session to progress mobility in effort for safe discharge home. Pt c/o "weak" sensation sitting EOB and pt hypotensive. Pt sat for ~3 minutes completing LE exercise to facilitate circulation and BP re-assessed, no change. After return to supine pt remained hypotensive and RN notified. Patient will benefit from additional skilled PT interventions to address impairments and progress mobility.   Vitals (Prior to supine<>sit pt BP running 120's-140's/80's) Time     BP         Position 1554     85/59     Sitting 1557     89/62     Sitting 1600     84/62     Supine   Follow Up Recommendations  Follow surgeon's recommendation for DC plan and follow-up therapies;Home health PT     Equipment Recommendations  Rolling walker with 5" wheels (youth/short)    Recommendations for Other Services       Precautions / Restrictions Precautions Precautions: Fall Restrictions Weight Bearing Restrictions: No Other Position/Activity Restrictions: WBAT    Mobility  Bed Mobility Overal bed mobility: Needs Assistance Bed Mobility: Supine to Sit;Sit to Supine     Supine to sit: Min guard Sit to supine: Min assist   General bed mobility comments: pt sat up to EOB, min guard and extra time to complete. Pt c/o weak sensation and BP had dropped to 85/59. Pt sat EOB and completed exercises for 3 minutes. Bp re-assessed and no improvement, 89/62. Pt required min assist to return to supine.   Transfers Overall transfer level: Needs assistance Equipment used: Rolling walker (2 wheeled) Transfers: Sit to/from Stand Sit to Stand: Min assist         General transfer comment:  cues for safe hand placement and technique with RW, min guard to rise and assist to steady once standing.  Ambulation/Gait Ambulation/Gait assistance: Min assist Gait Distance (Feet): 20 Feet Assistive device: Rolling walker (2 wheeled) Gait Pattern/deviations: Step-to pattern;Decreased stride length;Decreased step length - left;Decreased step length - right;Decreased weight shift to right Gait velocity: decr   General Gait Details: cues for safe step pattern and proximity to RW. pt unsteady requiring Min assist for to steady and prevent LOB. (suspect pts hips weak due to spinal block)   Stairs             Wheelchair Mobility    Modified Rankin (Stroke Patients Only)       Balance Overall balance assessment: Needs assistance Sitting-balance support: Feet supported Sitting balance-Leahy Scale: Good     Standing balance support: During functional activity;Bilateral upper extremity supported Standing balance-Leahy Scale: Poor                              Cognition Arousal/Alertness: Awake/alert Behavior During Therapy: WFL for tasks assessed/performed Overall Cognitive Status: Within Functional Limits for tasks assessed                     Exercises Total Joint Exercises Ankle Circles/Pumps: Both;AROM;20 reps;Seated    General Comments        Pertinent Vitals/Pain Pain Assessment: Faces Faces Pain  Scale: Hurts little more Pain Location: Rt hip Pain Descriptors / Indicators: Aching;Discomfort Pain Intervention(s): Limited activity within patient's tolerance;Repositioned;Monitored during session;Premedicated before session    Home Living Family/patient expects to be discharged to:: Private residence Living Arrangements: Children Available Help at Discharge: Family Type of Home: House Home Access: Stairs to enter Entrance Stairs-Rails: None Home Layout: One level Home Equipment: Cane - single point;Walker - 4 wheels;Walker - 2  wheels;Shower seat Additional Comments: pt lives with her son but is staying with her daugher for about a week; home envornment reflects her daughters home    Prior Function Level of Independence: Independent with assistive device(s)      Comments: pt ambulates with Rollator primarily and uses SPC for stairs.   PT Goals (current goals can now be found in the care plan section) Acute Rehab PT Goals Patient Stated Goal: go home today PT Goal Formulation: With patient Time For Goal Achievement: 09/23/20 Potential to Achieve Goals: Good Progress towards PT goals: Progressing toward goals    Frequency    7X/week      PT Plan Current plan remains appropriate    Co-evaluation              AM-PAC PT "6 Clicks" Mobility   Outcome Measure  Help needed turning from your back to your side while in a flat bed without using bedrails?: None Help needed moving from lying on your back to sitting on the side of a flat bed without using bedrails?: A Little Help needed moving to and from a bed to a chair (including a wheelchair)?: A Little Help needed standing up from a chair using your arms (e.g., wheelchair or bedside chair)?: A Little Help needed to walk in hospital room?: A Little Help needed climbing 3-5 steps with a railing? : A Little 6 Click Score: 19    End of Session Equipment Utilized During Treatment: Gait belt Activity Tolerance: Treatment limited secondary to medical complications (Comment) (orthostatic) Patient left: in bed;with call bell/phone within reach Nurse Communication: Mobility status PT Visit Diagnosis: Other abnormalities of gait and mobility (R26.89);Difficulty in walking, not elsewhere classified (R26.2);Muscle weakness (generalized) (M62.81)     Time: 2947-6546 PT Time Calculation (min) (ACUTE ONLY): 11 min  Charges:  $Therapeutic Activity: 8-22 mins                     Verner Mould, DPT Acute Rehabilitation Services  Office [phone] Pager  [phone]  09/16/2020 4:18 PM

## 2020-09-17 ENCOUNTER — Encounter (HOSPITAL_COMMUNITY): Payer: Self-pay | Admitting: Orthopedic Surgery

## 2020-09-17 DIAGNOSIS — M1611 Unilateral primary osteoarthritis, right hip: Secondary | ICD-10-CM | POA: Diagnosis not present

## 2020-09-17 LAB — BASIC METABOLIC PANEL
Anion gap: 8 (ref 5–15)
BUN: 15 mg/dL (ref 8–23)
CO2: 27 mmol/L (ref 22–32)
Calcium: 8.9 mg/dL (ref 8.9–10.3)
Chloride: 103 mmol/L (ref 98–111)
Creatinine, Ser: 1 mg/dL (ref 0.44–1.00)
GFR calc Af Amer: >60 mL/min (ref >60)
GFR calc non Af Amer: 52 mL/min — ABNORMAL LOW (ref >60)
Glucose, Bld: 150 mg/dL — ABNORMAL HIGH (ref 70–99)
Potassium: 4.7 mmol/L (ref 3.5–5.1)
Sodium: 138 mmol/L (ref 135–145)

## 2020-09-17 LAB — CBC
HCT: 25.7 % — ABNORMAL LOW (ref 36.0–46.0)
Hemoglobin: 7.7 g/dL — ABNORMAL LOW (ref 12.0–15.0)
MCH: 31.7 pg (ref 26.0–34.0)
MCHC: 30 g/dL (ref 30.0–36.0)
MCV: 105.8 fL — ABNORMAL HIGH (ref 80.0–100.0)
Platelets: 140 10*3/uL — ABNORMAL LOW (ref 150–400)
RBC: 2.43 MIL/uL — ABNORMAL LOW (ref 3.87–5.11)
RDW: 13.4 % (ref 11.5–15.5)
WBC: 10 10*3/uL (ref 4.0–10.5)
nRBC: 0 % (ref 0.0–0.2)

## 2020-09-17 NOTE — TOC Transition Note (Signed)
Transition of Care (TOC) - CM/SW Discharge Note   Patient Details  Name: Mary Chase MRN: [id number] Date of Birth: 07/22/1935  Transition of Care (TOC) CM/SW Contact:  HOYLE, LUCY, LCSW Phone Number: 09/17/2020, 11:15 AM   Clinical Narrative:    Met with pt this morning to review dc needs.  Pt aware she has been referred to [facility] for HHPT.  Discussed DME and pt agreeable to PT's recommendation for youth rolling walker - ordered via [employer] for delivery to pt's room prior to dc today.  No further TOC needs.   Final next level of care: Home w Home Health Services Barriers to Discharge: Barriers Resolved   Patient Goals and CMS Choice Patient states their goals for this hospitalization and ongoing recovery are:: return home CMS Medicare.gov Compare Post Acute Care list provided to:: Patient Choice offered to / list presented to : Patient  Discharge Placement                       Discharge Plan and Services                DME Arranged: Walker youth DME Agency: [employer] Date DME Agency Contacted: 09/17/20 Time DME Agency Contacted: 1114 Representative spoke with at DME Agency: Sheila HH Arranged: PT HH Agency: [facility] (formerly [facility]) Date HH Agency Contacted:  (referral placed per MD office prior to surgery)      Social Determinants of Health (SDOH) Interventions     Readmission Risk Interventions No flowsheet data found.     

## 2020-09-17 NOTE — Discharge Summary (Signed)
Patient ID: Raguel Kosloski Valido MRN: [id number] DOB/AGE: 1935-10-31 85 y.o.  Admit date: 09/16/2020 Discharge date: 09/17/2020  Admission Diagnoses:  Principal Problem:   Osteoarthritis of right hip Active Problems:   S/P total hip arthroplasty   Discharge Diagnoses:  Same  Past Medical History:  Diagnosis Date  . Arthritis   . Asthma   . Cataract   . COPD (chronic obstructive pulmonary disease) ([place])   . Dyspnea    with exertion  . GERD (gastroesophageal reflux disease)   . Headache   . Hypertension   . Pneumonia   . Pre-diabetes    A1c within normal limits last check  . Sickle cell anemia (H[facility])    trait  . Sickle cell trait ([place])     Surgeries: Procedure(s): RIGHT TOTAL HIP ARTHROPLASTY ANTERIOR APPROACH on 09/16/2020   Consultants:   Discharged Condition: Improved  Hospital Course: JANNETTE COTHAM is an 85 y.o. female who was admitted 09/16/2020 for operative treatment ofOsteoarthritis of right hip. Patient has severe unremitting pain that affects sleep, daily activities, and work/hobbies. After pre-op clearance the patient was taken to the operating room on 09/16/2020 and underwent  Procedure(s): RIGHT TOTAL HIP ARTHROPLASTY ANTERIOR APPROACH.    Patient was given perioperative antibiotics:  Anti-infectives (From admission, onward)   Start     Dose/Rate Route Frequency Ordered Stop   09/16/20 0645  vancomycin (VANCOCIN) IVPB 1000 mg/200 mL premix        1,000 mg 200 mL/hr over 60 Minutes Intravenous On call to O.R. 09/16/20 8242 09/16/20 1011       Patient was given sequential compression devices, early ambulation, and chemoprophylaxis to prevent DVT.  Patient benefited maximally from hospital stay and there were no complications.    Recent vital signs:  Patient Vitals for the past 24 hrs:  BP Temp Temp src Pulse Resp SpO2  09/17/20 1326 (!) 164/89 98.9 F (37.2 C) Oral 90 18 99 %  09/17/20 0942 (!) 141/75 97.9 F (36.6 C) Oral 90 16 100 %  09/17/20  0615 (!) 153/93 98.2 F (36.8 C) Oral 93 16 99 %  09/17/20 0217 (!) 155/90 98.5 F (36.9 C) Oral 86 16 98 %  09/16/20 2208 (!) 136/97 98.9 F (37.2 C) Oral 100 16 99 %  09/16/20 1754 (!) 147/95 97.9 F (36.6 C) Oral 98 16 96 %  09/16/20 1652 (!) 152/106 98.2 F (36.8 C) Oral 98 19 98 %  09/16/20 1630 (!) 163/92 97.6 F (36.4 C) -- 78 15 98 %  09/16/20 1618 100/72 -- -- -- -- --  09/16/20 1600 (!) 84/62 -- -- -- -- --  09/16/20 1557 (!) 89/62 -- -- -- -- --  09/16/20 1554 (!) 85/59 -- -- -- -- --     Recent laboratory studies:  Recent Labs    09/17/20 0321 09/17/20 0426  WBC 10.0  --   HGB 7.7*  --   HCT 25.7*  --   PLT 140*  --   NA  --  138  K  --  4.7  CL  --  103  CO2  --  27  BUN  --  15  CREATININE  --  1.00  GLUCOSE  --  150*  CALCIUM  --  8.9     Discharge Medications:   Allergies as of 09/17/2020      Reactions   Penicillins Shortness Of Breath, Rash   Keflex [cephalexin] Itching   Sulfa Antibiotics Itching, Other (See Comments)  Swelling in mouth   Other Itching   Some beans       Medication List    TAKE these medications   albuterol 108 (90 Base) MCG/ACT inhaler Commonly known as: VENTOLIN HFA Inhale 2 puffs into the lungs every 6 (six) hours as needed for wheezing.   aspirin EC 81 MG tablet Take 1 tablet (81 mg total) by mouth 2 (two) times daily.   lisinopril 40 MG tablet Commonly known as: ZESTRIL Take 40 mg by mouth daily.   magnesium gluconate 500 MG tablet Commonly known as: MAGONATE Take 500 mg by mouth daily.   montelukast 10 MG tablet Commonly known as: SINGULAIR Take 10 mg by mouth daily.   oxyCODONE-acetaminophen 5-325 MG tablet Commonly known as: PERCOCET/ROXICET Take 1 tablet by mouth every 4 (four) hours as needed for severe pain.   PRESERVISION AREDS 2 PO Take 2 tablets by mouth daily in the afternoon.   tiZANidine 2 MG tablet Commonly known as: ZANAFLEX Take 1 tablet (2 mg total) by mouth every 6 (six) hours as  needed.   Vitamin B-12 5000 MCG Tbdp Take 5,000 mcg by mouth daily.   Vitamin D3 25 MCG (1000 UT) Caps Take 1,000 Units by mouth daily in the afternoon.   Voltaren 1 % Gel Generic drug: diclofenac Sodium Apply 2 g topically daily as needed (Pain).   Wixela Inhub 250-50 MCG/DOSE Aepb Generic drug: Fluticasone-Salmeterol Inhale 1 puff into the lungs 2 (two) times daily.            Durable Medical Equipment  (From admission, onward)         Start     Ordered   09/16/20 1658  DME Walker rolling  Once       Question:  Patient needs a walker to treat with the following condition  Answer:  Status post right hip replacement   09/16/20 1657   09/16/20 1658  DME 3 n 1  Once        09/16/20 1657           Discharge Care Instructions  (From admission, onward)         Start     Ordered   09/17/20 0000  Weight bearing as tolerated        09/17/20 1517   09/16/20 0000  Weight bearing as tolerated        09/16/20 1110          Diagnostic Studies: DG Chest 2 View  Result Date: 09/11/2020 CLINICAL DATA:  Preoperative hip surgery. Reported history of sickle cell trait EXAM: CHEST - 2 VIEW COMPARISON:  October 24, 2013 FINDINGS: There is scarring in the left mid lung and both base regions. The lungs otherwise are clear. Heart is upper normal in size with pulmonary vascularity normal. No adenopathy. There is extensive arthropathy in each shoulder with questionable superimposed avascular necrosis in portions of each humeral head. IMPRESSION: Areas of mild scarring bilaterally. No edema or airspace opacity. Heart upper normal in size. Extensive shoulder arthropathy bilaterally with questionable degree of avascular necrosis in each humeral head. Electronically Signed   By: Lowella Grip III M.D.   On: 09/11/2020 08:37   CT Head Wo Contrast  Result Date: 09/10/2020 CLINICAL DATA:  Headache. EXAM: CT HEAD WITHOUT CONTRAST TECHNIQUE: Contiguous axial images were obtained from the  base of the skull through the vertex without intravenous contrast. COMPARISON:  None. FINDINGS: Brain: Mild chronic ischemic white matter disease is noted. No mass effect  or midline shift is noted. Ventricular size is within normal limits. There is no evidence of mass lesion, hemorrhage or acute infarction. Vascular: No hyperdense vessel or unexpected calcification. Skull: Normal. Negative for fracture or focal lesion. Sinuses/Orbits: Pan sinusitis is noted. Other: None. IMPRESSION: Mild chronic ischemic white matter disease. Pan sinusitis. No acute intracranial abnormality seen. Electronically Signed   By: Marijo Conception M.D.   On: 09/10/2020 19:04   DG C-Arm 1-60 Min-No Report  Result Date: 09/16/2020 Fluoroscopy was utilized by the requesting physician.  No radiographic interpretation.   DG HIP OPERATIVE UNILAT W OR W/O PELVIS RIGHT  Result Date: 09/16/2020 CLINICAL DATA:  Right hip replacement EXAM: OPERATIVE RIGHT HIP WITH PELVIS COMPARISON:  07/25/2020 FLUOROSCOPY TIME:  Radiation Exposure Index (as provided by the fluoroscopic device): Not available If the device does not provide the exposure index: Fluoroscopy Time:  Not available Number of Acquired Images:  3 FINDINGS: Right hip prosthesis is noted in satisfactory position. No acute bony or soft tissue abnormality is noted. IMPRESSION: Status post right hip replacement Electronically Signed   By: Inez Catalina M.D.   On: 09/16/2020 10:53    Disposition: Discharge disposition: 01-Home or Self Care       Discharge Instructions    Call MD / Call 911   Complete by: As directed    If you experience chest pain or shortness of breath, CALL 911 and be transported to the hospital emergency room.  If you develope a fever above 101 F, pus (white drainage) or increased drainage or redness at the wound, or calf pain, call your surgeon's office.   Call MD / Call 911   Complete by: As directed    If you experience chest pain or shortness of breath,  CALL 911 and be transported to the hospital emergency room.  If you develope a fever above 101 F, pus (white drainage) or increased drainage or redness at the wound, or calf pain, call your surgeon's office.   Constipation Prevention   Complete by: As directed    Drink plenty of fluids.  Prune juice may be helpful.  You may use a stool softener, such as Colace (over the counter) 100 mg twice a day.  Use MiraLax (over the counter) for constipation as needed.   Constipation Prevention   Complete by: As directed    Drink plenty of fluids.  Prune juice may be helpful.  You may use a stool softener, such as Colace (over the counter) 100 mg twice a day.  Use MiraLax (over the counter) for constipation as needed.   Diet - low sodium heart healthy   Complete by: As directed    Driving restrictions   Complete by: As directed    No driving for 2 weeks   Driving restrictions   Complete by: As directed    No driving for 2 weeks   Increase activity slowly as tolerated   Complete by: As directed    Increase activity slowly as tolerated   Complete by: As directed    Patient may shower   Complete by: As directed    You may shower without a dressing once there is no drainage.  Do not wash over the wound.  If drainage remains, cover wound with plastic wrap and then shower.   Patient may shower   Complete by: As directed    You may shower without a dressing once there is no drainage.  Do not wash over the wound.  If  drainage remains, cover wound with plastic wrap and then shower.   Weight bearing as tolerated   Complete by: As directed    Weight bearing as tolerated   Complete by: As directed        Follow-up Information    Frederik Pear, MD In 2 weeks.   Specialty: Orthopedic Surgery Contact information: [place] [address] [phone]        [facility] Follow up.   Specialty: [place] Why: to provide home health physical therapy Contact information: [address] [place] [address] [phone]                Signed: Joanell Rising 09/17/2020, 3:17 PM

## 2020-09-17 NOTE — Progress Notes (Signed)
Physical Therapy Treatment Patient Details Name: Mary Chase MRN: [id number] DOB: 12-12-1935 Today's Date: 09/17/2020    History of Present Illness Patient is 85 y.o. female s/p Rt THA anterior approach on 09/16/20 with PMH significant for HTN, GERD, COPD, OA, sickle cell anemia.    PT Comments    Pt ambulated in hallway, practiced safe stair technique, and performed LE exercises.  Pt uses UE for weight bearing with mobility so recommend RW (youth) upon d/c.  Pt to f/u with HHPT.  Pt feels ready for d/c home today.    Follow Up Recommendations  Follow surgeon's recommendation for DC plan and follow-up therapies;Home health PT     Equipment Recommendations  Rolling walker with 5" wheels (youth)    Recommendations for Other Services       Precautions / Restrictions Precautions Precautions: Fall Restrictions Other Position/Activity Restrictions: WBAT    Mobility  Bed Mobility Overal bed mobility: Needs Assistance Bed Mobility: Supine to Sit     Supine to sit: Supervision        Transfers Overall transfer level: Needs assistance Equipment used: Rolling walker (2 wheeled) Transfers: Sit to/from Stand Sit to Stand: Min guard         General transfer comment: verbal cues for safe technique  Ambulation/Gait Ambulation/Gait assistance: Min guard Gait Distance (Feet): 80 Feet Assistive device: Rolling walker (2 wheeled) Gait Pattern/deviations: Decreased stride length;Decreased weight shift to right;Step-through pattern Gait velocity: decr   General Gait Details: verbal cues for sequence, RW positioning, posture   Stairs Stairs: Yes Stairs assistance: Min guard Stair Management: Step to pattern;Forwards;Two rails Number of Stairs: 3 General stair comments: verbal cues for sequence and safety; pt performed twice   Wheelchair Mobility    Modified Rankin (Stroke Patients Only)       Balance                                             Cognition Arousal/Alertness: Awake/alert Behavior During Therapy: WFL for tasks assessed/performed Overall Cognitive Status: Within Functional Limits for tasks assessed                                        Exercises Total Joint Exercises Ankle Circles/Pumps: Both;AROM;10 reps Quad Sets: AROM;Both;10 reps Short Arc QuadSinclair Ship;Right;10 reps Heel Slides: AAROM;Right;10 reps Hip ABduction/ADduction: AAROM;Right;10 reps Long Arc Quad: AROM;Seated;Right;10 reps    General Comments        Pertinent Vitals/Pain Pain Assessment: 0-10 Pain Score: 4  Pain Location: Rt hip Pain Descriptors / Indicators: Aching;Discomfort;Sore Pain Intervention(s): Repositioned;Premedicated before session;Monitored during session    Home Living                      Prior Function            PT Goals (current goals can now be found in the care plan section) Progress towards PT goals: Progressing toward goals    Frequency    7X/week      PT Plan Current plan remains appropriate    Co-evaluation              AM-PAC PT "6 Clicks" Mobility   Outcome Measure  Help needed turning from your back to your side while in a flat bed without  using bedrails?: None Help needed moving from lying on your back to sitting on the side of a flat bed without using bedrails?: None Help needed moving to and from a bed to a chair (including a wheelchair)?: A Little Help needed standing up from a chair using your arms (e.g., wheelchair or bedside chair)?: A Little Help needed to walk in hospital room?: A Little Help needed climbing 3-5 steps with a railing? : A Little 6 Click Score: 20    End of Session Equipment Utilized During Treatment: Gait belt Activity Tolerance: Patient tolerated treatment well Patient left: in chair;with call bell/phone within reach Nurse Communication: Mobility status PT Visit Diagnosis: Other abnormalities of gait and mobility (R26.89);Muscle  weakness (generalized) (M62.81)     Time: 9150-5697 PT Time Calculation (min) (ACUTE ONLY): 23 min  Charges:  $Gait Training: 8-22 mins $Therapeutic Exercise: 8-22 mins                    Jannette Spanner PT, DPT Acute Rehabilitation Services Pager: [phone] Office: [phone]  York Ram E 09/17/2020, 2:00 PM

## 2020-09-17 NOTE — Progress Notes (Signed)
PATIENT ID: Shulamis Wenberg Geise  MRN: [id number]  DOB/AGE:  May 27, 1935 / 85 y.o.  1 Day Post-Op Procedure(s) (LRB): RIGHT TOTAL HIP ARTHROPLASTY ANTERIOR APPROACH (Right)    PROGRESS NOTE Subjective: Patient is alert, oriented, no Nausea, no Vomiting, yes passing gas, . Taking PO well. Denies SOB, Chest or Calf Pain. Using Incentive Spirometer, PAS in place. Ambulate Up to bathroom Patient reports pain as  4/10  .    Objective: Vital signs in last 24 hours: Vitals:   09/16/20 1754 09/16/20 2208 09/17/20 0217 09/17/20 0615  BP: (Abnormal) 147/95 (Abnormal) 136/97 (Abnormal) 155/90 (Abnormal) 153/93  Pulse: 98 100 86 93  Resp: 16 16 16 16   Temp: 97.9 F (36.6 C) 98.9 F (37.2 C) 98.5 F (36.9 C) 98.2 F (36.8 C)  TempSrc: Oral Oral Oral Oral  SpO2: 96% 99% 98% 99%  Weight:      Height:          Intake/Output from previous day: I/O last 3 completed shifts: In: 4146 [P.O.:300; I.V.:2746; IV Piggyback:1100] Out: 350 [Urine:150; Blood:200]   Intake/Output this shift: No intake/output data recorded.   LABORATORY DATA: Recent Labs    09/17/20 0321 09/17/20 0426  WBC 10.0  --   HGB 7.7*  --   HCT 25.7*  --   PLT 140*  --   NA  --  138  K  --  4.7  CL  --  103  CO2  --  27  BUN  --  15  CREATININE  --  1.00  GLUCOSE  --  150*  CALCIUM  --  8.9    Examination: Neurologically intact ABD soft Neurovascular intact Sensation intact distally Intact pulses distally Dorsiflexion/Plantar flexion intact Incision: dressing C/D/I No cellulitis present Compartment soft} XR AP&Lat of hip shows well placed\fixed THA  Assessment:   1 Day Post-Op Procedure(s) (LRB): RIGHT TOTAL HIP ARTHROPLASTY ANTERIOR APPROACH (Right) ADDITIONAL DIAGNOSIS:  Expected Acute Blood Loss Anemia, hypovolemia  Patient's anticipated LOS is less than 2 midnights, meeting these requirements: - Younger than 36 - Lives within 1 hour of care - Has a competent adult at home to recover with post-op  recover - NO history of  - Chronic pain requiring opiods  - Diabetes  - Coronary Artery Disease  - Heart failure  - Heart attack  - Stroke  - DVT/VTE  - Cardiac arrhythmia  - Respiratory Failure/COPD  - Renal failure  - Anemia  - Advanced Liver disease       Plan: PT/OT WBAT, THA  DVT Prophylaxis: SCDx72 hrs, ASA 81 mg BID x 2 weeks  DISCHARGE PLAN: Home, today if passes PT, will check back after lunch today.  DISCHARGE NEEDS: HHPT, Walker and 3-in-1 comode seatPatient ID: Cristie Hem, female   DOB: 01-20-1935, 85 y.o.   MRN: [id number]

## 2020-12-26 DIAGNOSIS — M47816 Spondylosis without myelopathy or radiculopathy, lumbar region: Secondary | ICD-10-CM | POA: Diagnosis not present

## 2021-01-22 DIAGNOSIS — M47816 Spondylosis without myelopathy or radiculopathy, lumbar region: Secondary | ICD-10-CM | POA: Diagnosis not present

## 2021-02-07 DIAGNOSIS — M47816 Spondylosis without myelopathy or radiculopathy, lumbar region: Secondary | ICD-10-CM | POA: Diagnosis not present

## 2021-02-15 DIAGNOSIS — M810 Age-related osteoporosis without current pathological fracture: Secondary | ICD-10-CM | POA: Diagnosis not present

## 2021-02-15 DIAGNOSIS — I10 Essential (primary) hypertension: Secondary | ICD-10-CM | POA: Diagnosis not present

## 2021-02-15 DIAGNOSIS — M1611 Unilateral primary osteoarthritis, right hip: Secondary | ICD-10-CM | POA: Diagnosis not present

## 2021-02-15 DIAGNOSIS — E1122 Type 2 diabetes mellitus with diabetic chronic kidney disease: Secondary | ICD-10-CM | POA: Diagnosis not present

## 2021-02-15 DIAGNOSIS — J45909 Unspecified asthma, uncomplicated: Secondary | ICD-10-CM | POA: Diagnosis not present

## 2021-02-15 DIAGNOSIS — E7800 Pure hypercholesterolemia, unspecified: Secondary | ICD-10-CM | POA: Diagnosis not present

## 2021-02-15 DIAGNOSIS — N183 Chronic kidney disease, stage 3 unspecified: Secondary | ICD-10-CM | POA: Diagnosis not present

## 2021-02-15 DIAGNOSIS — H35033 Hypertensive retinopathy, bilateral: Secondary | ICD-10-CM | POA: Diagnosis not present

## 2021-02-20 DIAGNOSIS — M47816 Spondylosis without myelopathy or radiculopathy, lumbar region: Secondary | ICD-10-CM | POA: Diagnosis not present

## 2021-03-13 DIAGNOSIS — H2589 Other age-related cataract: Secondary | ICD-10-CM | POA: Diagnosis not present

## 2021-03-13 DIAGNOSIS — H353121 Nonexudative age-related macular degeneration, left eye, early dry stage: Secondary | ICD-10-CM | POA: Diagnosis not present

## 2021-03-13 DIAGNOSIS — H25013 Cortical age-related cataract, bilateral: Secondary | ICD-10-CM | POA: Diagnosis not present

## 2021-03-13 DIAGNOSIS — H2512 Age-related nuclear cataract, left eye: Secondary | ICD-10-CM | POA: Diagnosis not present

## 2021-03-13 DIAGNOSIS — H2513 Age-related nuclear cataract, bilateral: Secondary | ICD-10-CM | POA: Diagnosis not present

## 2021-03-13 DIAGNOSIS — H35371 Puckering of macula, right eye: Secondary | ICD-10-CM | POA: Diagnosis not present

## 2021-04-13 DIAGNOSIS — M169 Osteoarthritis of hip, unspecified: Secondary | ICD-10-CM | POA: Diagnosis not present

## 2021-04-13 DIAGNOSIS — J45909 Unspecified asthma, uncomplicated: Secondary | ICD-10-CM | POA: Diagnosis not present

## 2021-04-13 DIAGNOSIS — N183 Chronic kidney disease, stage 3 unspecified: Secondary | ICD-10-CM | POA: Diagnosis not present

## 2021-04-13 DIAGNOSIS — M810 Age-related osteoporosis without current pathological fracture: Secondary | ICD-10-CM | POA: Diagnosis not present

## 2021-04-13 DIAGNOSIS — I10 Essential (primary) hypertension: Secondary | ICD-10-CM | POA: Diagnosis not present

## 2021-04-13 DIAGNOSIS — H35033 Hypertensive retinopathy, bilateral: Secondary | ICD-10-CM | POA: Diagnosis not present

## 2021-04-13 DIAGNOSIS — E1122 Type 2 diabetes mellitus with diabetic chronic kidney disease: Secondary | ICD-10-CM | POA: Diagnosis not present

## 2021-04-13 DIAGNOSIS — E7800 Pure hypercholesterolemia, unspecified: Secondary | ICD-10-CM | POA: Diagnosis not present

## 2021-05-17 DIAGNOSIS — E1122 Type 2 diabetes mellitus with diabetic chronic kidney disease: Secondary | ICD-10-CM | POA: Diagnosis not present

## 2021-05-17 DIAGNOSIS — E7800 Pure hypercholesterolemia, unspecified: Secondary | ICD-10-CM | POA: Diagnosis not present

## 2021-05-17 DIAGNOSIS — J45909 Unspecified asthma, uncomplicated: Secondary | ICD-10-CM | POA: Diagnosis not present

## 2021-05-17 DIAGNOSIS — N183 Chronic kidney disease, stage 3 unspecified: Secondary | ICD-10-CM | POA: Diagnosis not present

## 2021-05-17 DIAGNOSIS — I10 Essential (primary) hypertension: Secondary | ICD-10-CM | POA: Diagnosis not present

## 2021-05-17 DIAGNOSIS — M1611 Unilateral primary osteoarthritis, right hip: Secondary | ICD-10-CM | POA: Diagnosis not present

## 2021-05-17 DIAGNOSIS — H35033 Hypertensive retinopathy, bilateral: Secondary | ICD-10-CM | POA: Diagnosis not present

## 2021-05-17 DIAGNOSIS — M810 Age-related osteoporosis without current pathological fracture: Secondary | ICD-10-CM | POA: Diagnosis not present

## 2021-05-17 DIAGNOSIS — M169 Osteoarthritis of hip, unspecified: Secondary | ICD-10-CM | POA: Diagnosis not present

## 2021-05-21 DIAGNOSIS — R509 Fever, unspecified: Secondary | ICD-10-CM | POA: Diagnosis not present

## 2021-05-21 DIAGNOSIS — Z03818 Encounter for observation for suspected exposure to other biological agents ruled out: Secondary | ICD-10-CM | POA: Diagnosis not present

## 2021-05-21 DIAGNOSIS — R059 Cough, unspecified: Secondary | ICD-10-CM | POA: Diagnosis not present

## 2021-05-21 DIAGNOSIS — Z8709 Personal history of other diseases of the respiratory system: Secondary | ICD-10-CM | POA: Diagnosis not present

## 2021-05-21 DIAGNOSIS — J40 Bronchitis, not specified as acute or chronic: Secondary | ICD-10-CM | POA: Diagnosis not present

## 2021-05-21 DIAGNOSIS — J029 Acute pharyngitis, unspecified: Secondary | ICD-10-CM | POA: Diagnosis not present

## 2021-05-21 DIAGNOSIS — R0981 Nasal congestion: Secondary | ICD-10-CM | POA: Diagnosis not present

## 2021-06-04 DIAGNOSIS — H25812 Combined forms of age-related cataract, left eye: Secondary | ICD-10-CM | POA: Diagnosis not present

## 2021-06-04 DIAGNOSIS — H2512 Age-related nuclear cataract, left eye: Secondary | ICD-10-CM | POA: Diagnosis not present

## 2021-06-04 DIAGNOSIS — H2589 Other age-related cataract: Secondary | ICD-10-CM | POA: Diagnosis not present

## 2021-06-10 DIAGNOSIS — H25011 Cortical age-related cataract, right eye: Secondary | ICD-10-CM | POA: Diagnosis not present

## 2021-06-10 DIAGNOSIS — H2589 Other age-related cataract: Secondary | ICD-10-CM | POA: Diagnosis not present

## 2021-06-10 DIAGNOSIS — H2511 Age-related nuclear cataract, right eye: Secondary | ICD-10-CM | POA: Diagnosis not present

## 2021-06-18 DIAGNOSIS — H2511 Age-related nuclear cataract, right eye: Secondary | ICD-10-CM | POA: Diagnosis not present

## 2021-06-18 DIAGNOSIS — H25011 Cortical age-related cataract, right eye: Secondary | ICD-10-CM | POA: Diagnosis not present

## 2021-06-18 DIAGNOSIS — H25811 Combined forms of age-related cataract, right eye: Secondary | ICD-10-CM | POA: Diagnosis not present

## 2021-06-18 DIAGNOSIS — H2589 Other age-related cataract: Secondary | ICD-10-CM | POA: Diagnosis not present

## 2021-07-03 DIAGNOSIS — E7800 Pure hypercholesterolemia, unspecified: Secondary | ICD-10-CM | POA: Diagnosis not present

## 2021-07-03 DIAGNOSIS — Z0000 Encounter for general adult medical examination without abnormal findings: Secondary | ICD-10-CM | POA: Diagnosis not present

## 2021-07-03 DIAGNOSIS — I10 Essential (primary) hypertension: Secondary | ICD-10-CM | POA: Diagnosis not present

## 2021-07-03 DIAGNOSIS — I70213 Atherosclerosis of native arteries of extremities with intermittent claudication, bilateral legs: Secondary | ICD-10-CM | POA: Diagnosis not present

## 2021-07-03 DIAGNOSIS — M169 Osteoarthritis of hip, unspecified: Secondary | ICD-10-CM | POA: Diagnosis not present

## 2021-07-03 DIAGNOSIS — J45909 Unspecified asthma, uncomplicated: Secondary | ICD-10-CM | POA: Diagnosis not present

## 2021-07-03 DIAGNOSIS — N183 Chronic kidney disease, stage 3 unspecified: Secondary | ICD-10-CM | POA: Diagnosis not present

## 2021-07-03 DIAGNOSIS — I739 Peripheral vascular disease, unspecified: Secondary | ICD-10-CM | POA: Diagnosis not present

## 2021-07-03 DIAGNOSIS — M810 Age-related osteoporosis without current pathological fracture: Secondary | ICD-10-CM | POA: Diagnosis not present

## 2021-07-03 DIAGNOSIS — E1122 Type 2 diabetes mellitus with diabetic chronic kidney disease: Secondary | ICD-10-CM | POA: Diagnosis not present

## 2021-11-10 DIAGNOSIS — J343 Hypertrophy of nasal turbinates: Secondary | ICD-10-CM | POA: Diagnosis not present

## 2021-11-10 DIAGNOSIS — J338 Other polyp of sinus: Secondary | ICD-10-CM | POA: Diagnosis not present

## 2021-11-10 DIAGNOSIS — J310 Chronic rhinitis: Secondary | ICD-10-CM | POA: Diagnosis not present

## 2021-12-08 DIAGNOSIS — U071 COVID-19: Secondary | ICD-10-CM | POA: Diagnosis not present

## 2022-01-27 DIAGNOSIS — H35033 Hypertensive retinopathy, bilateral: Secondary | ICD-10-CM | POA: Diagnosis not present

## 2022-01-27 DIAGNOSIS — H353131 Nonexudative age-related macular degeneration, bilateral, early dry stage: Secondary | ICD-10-CM | POA: Diagnosis not present

## 2022-01-27 DIAGNOSIS — Z961 Presence of intraocular lens: Secondary | ICD-10-CM | POA: Diagnosis not present

## 2022-01-27 DIAGNOSIS — R7309 Other abnormal glucose: Secondary | ICD-10-CM | POA: Diagnosis not present

## 2022-02-19 DIAGNOSIS — R0602 Shortness of breath: Secondary | ICD-10-CM | POA: Diagnosis not present

## 2022-02-19 DIAGNOSIS — R051 Acute cough: Secondary | ICD-10-CM | POA: Diagnosis not present

## 2022-03-05 IMAGING — DX DG CHEST 2V
2 series · 2 of 2 positions shown · non-contrast
Comparison: October 24, 2013

CLINICAL DATA: Preoperative hip surgery. Reported history of sickle
cell trait

EXAM:
CHEST - 2 VIEW

[chest lat]
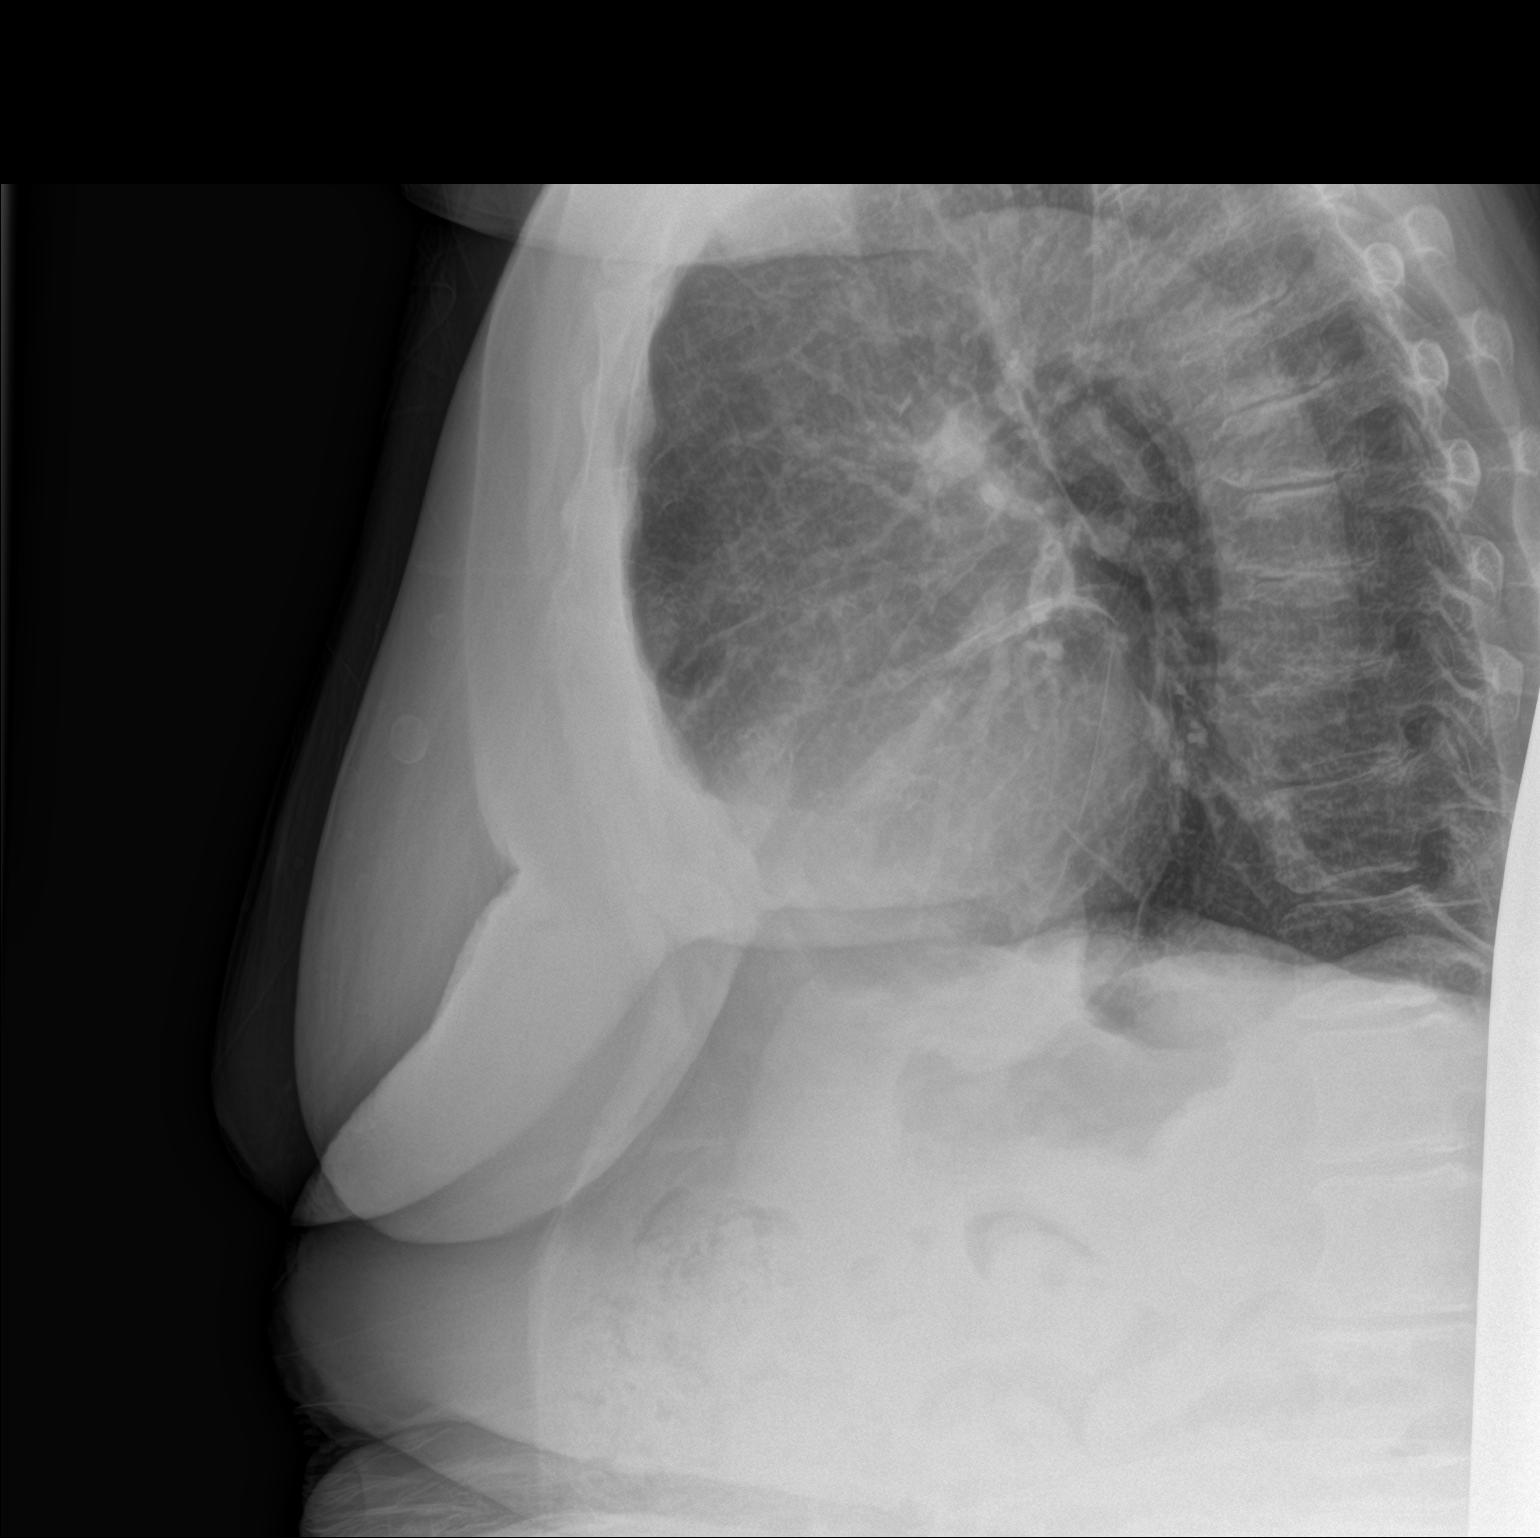

[chest ap]
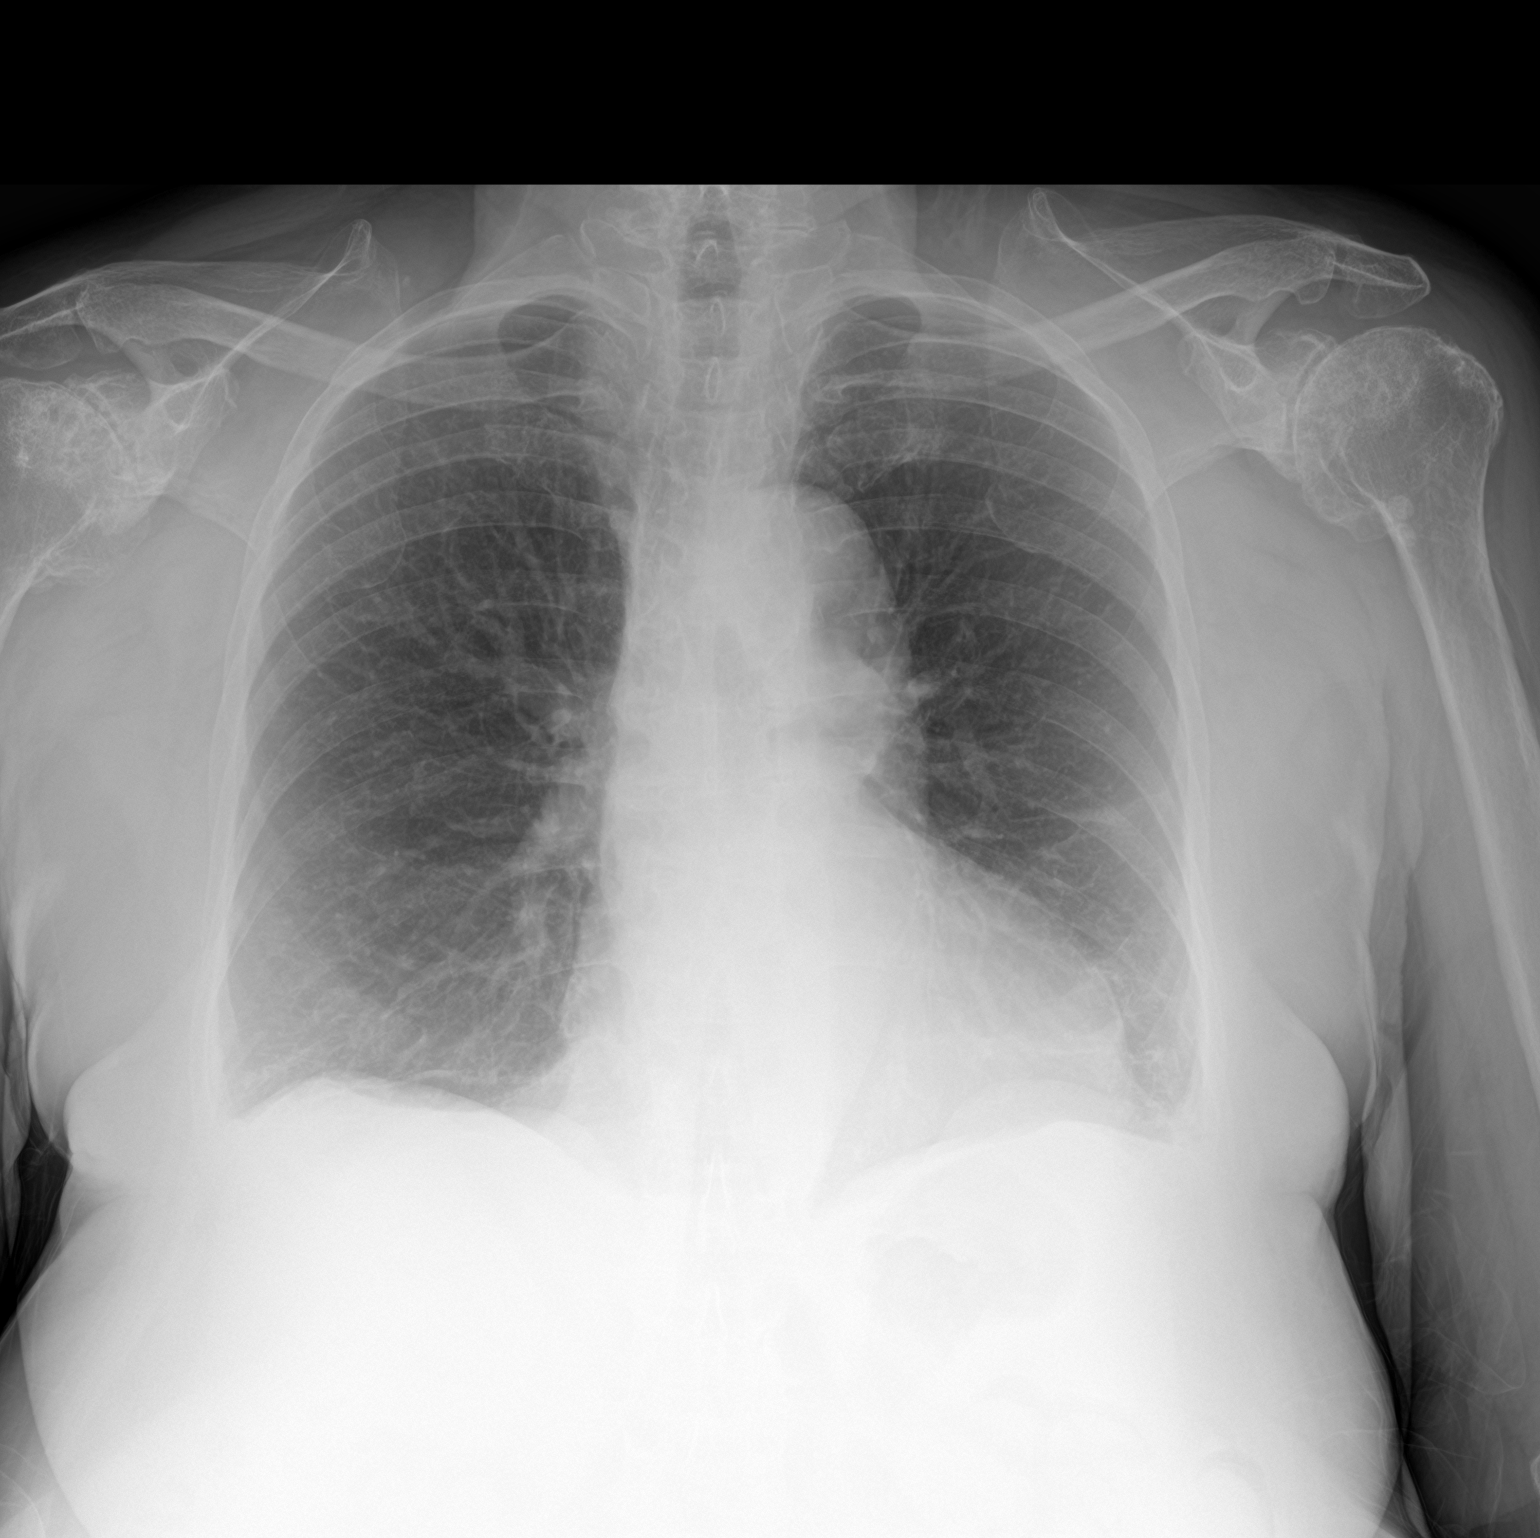

[2 of 2 positions shown; findings below may reference images not displayed]

FINDINGS: There is scarring in the left mid lung and both base regions. The
lungs otherwise are clear. Heart is upper normal in size with
pulmonary vascularity normal. No adenopathy. There is extensive
arthropathy in each shoulder with questionable superimposed
avascular necrosis in portions of each humeral head.
IMPRESSION: Areas of mild scarring bilaterally. No edema or airspace opacity.
Heart upper normal in size. Extensive shoulder arthropathy
bilaterally with questionable degree of avascular necrosis in each
humeral head.

## 2022-03-05 IMAGING — CT CT HEAD W/O CM
3 series · 15 of 46 positions shown, 18 images · non-contrast
Comparison: None.

CLINICAL DATA: Headache.

EXAM:
CT HEAD WITHOUT CONTRAST
TECHNIQUE: Contiguous axial images were obtained from the base of the skull
through the vertex without intravenous contrast.

[Series 2: head wo · axial · 0.45mm/px · z∈[+1160,+1280]mm · 9 of 29 slices shown, 12 images]
[im 3/29  brain]
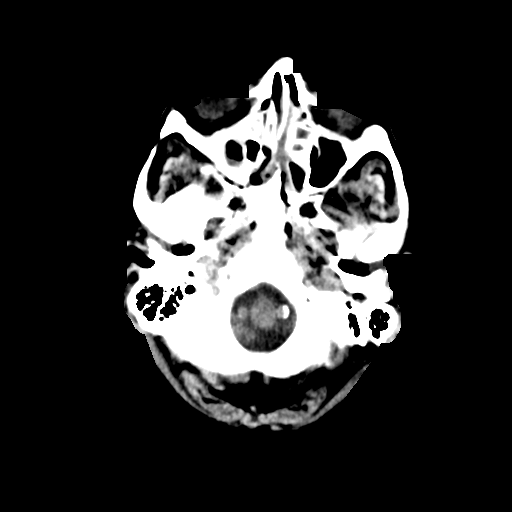
[im 3/29  bone]
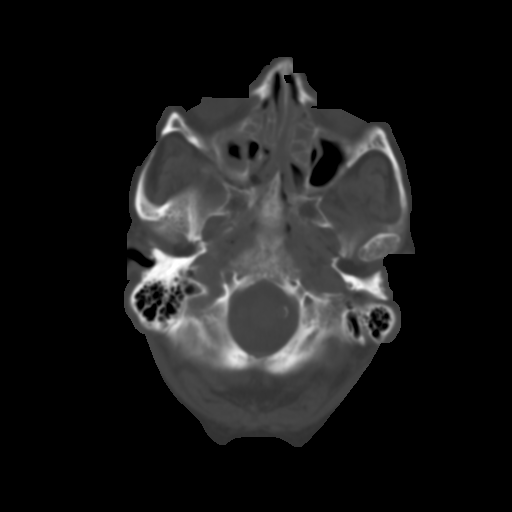
[im 6/29  brain]
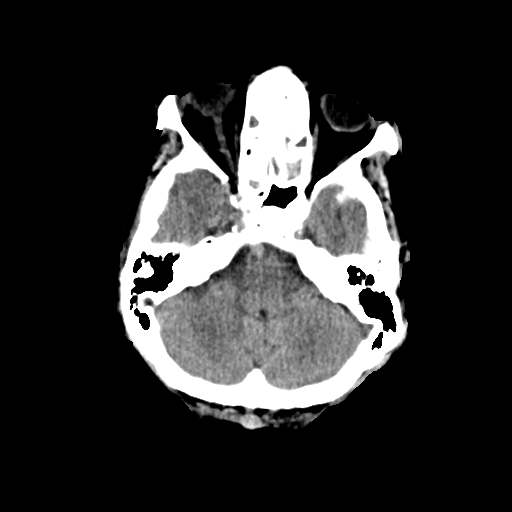
[im 9/29  brain]
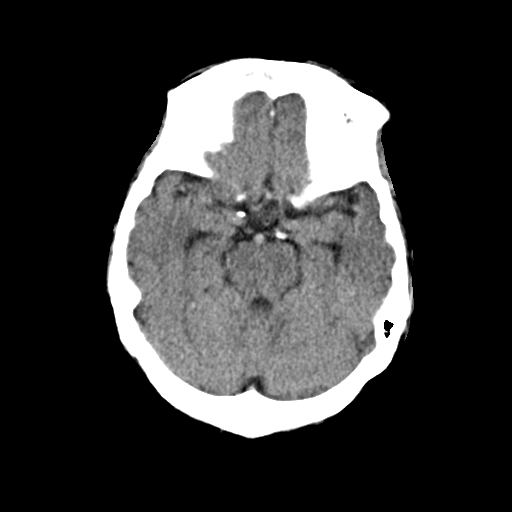
[im 12/29  brain]
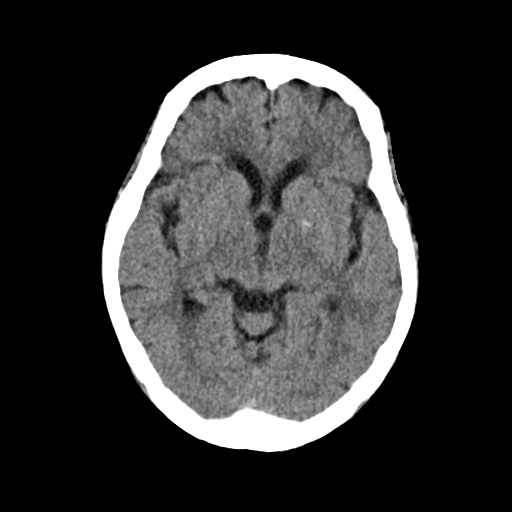
[im 15/29  brain]
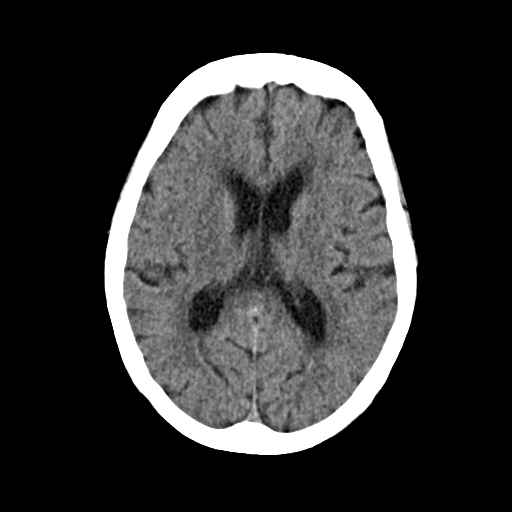
[im 15/29  bone]
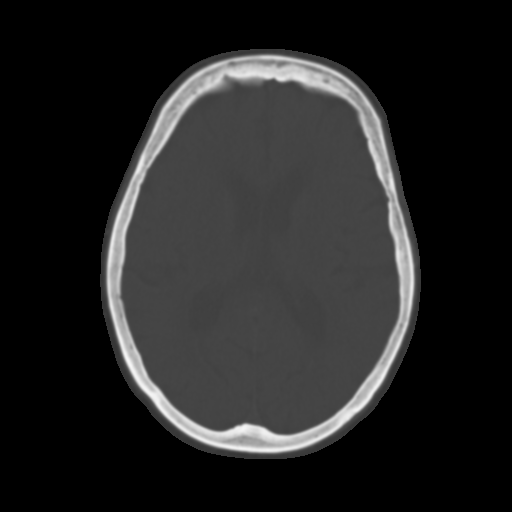
[im 18/29  brain]
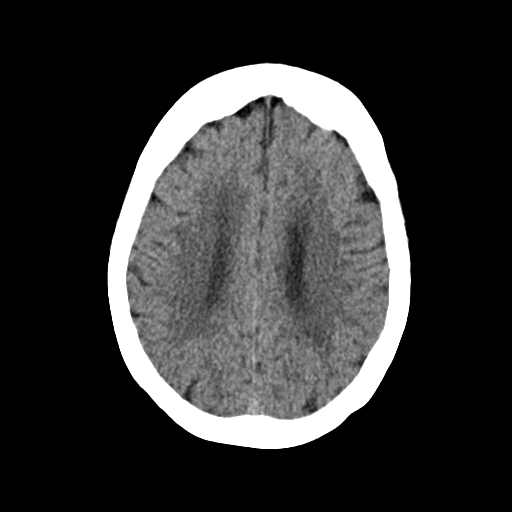
[im 21/29  brain]
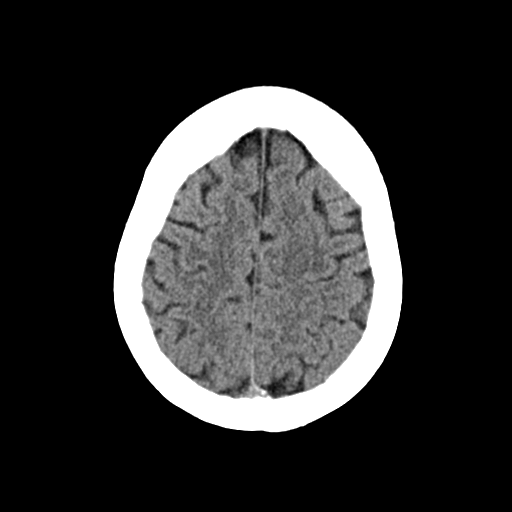
[im 24/29  brain]
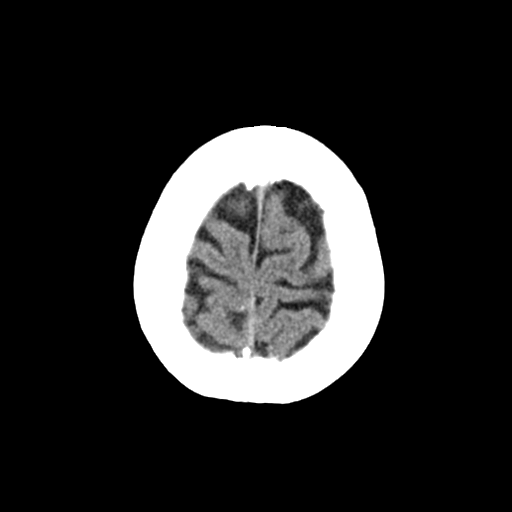
[im 27/29  brain]
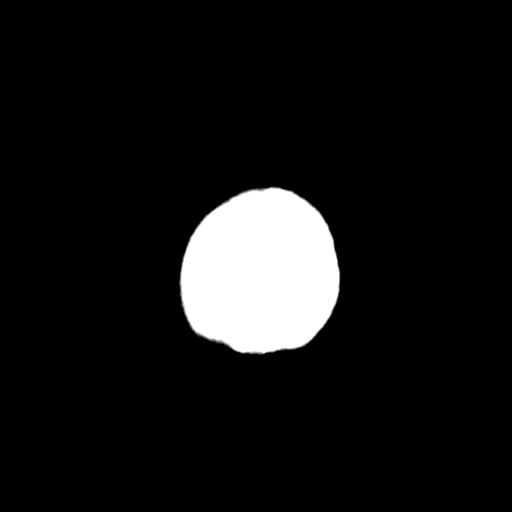
[im 27/29  bone]
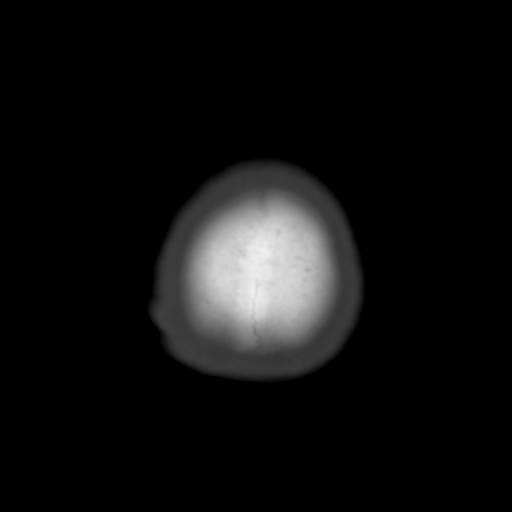

[Series 4: coronal soft · coronal · 0.30mm/px · 3 of 72 slices shown]
[im 24/72  brain]
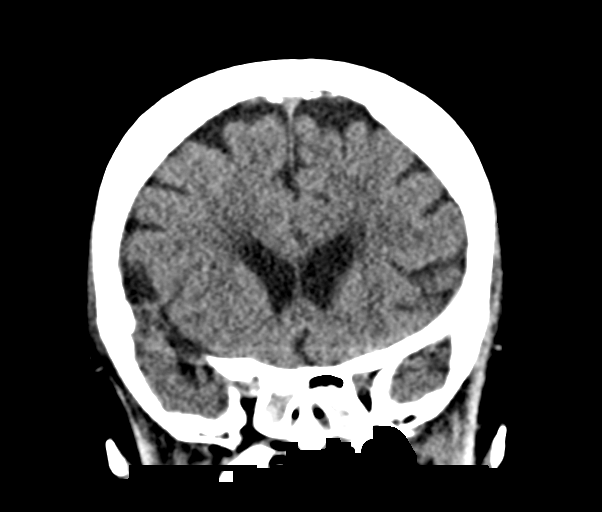
[im 32/72  brain]
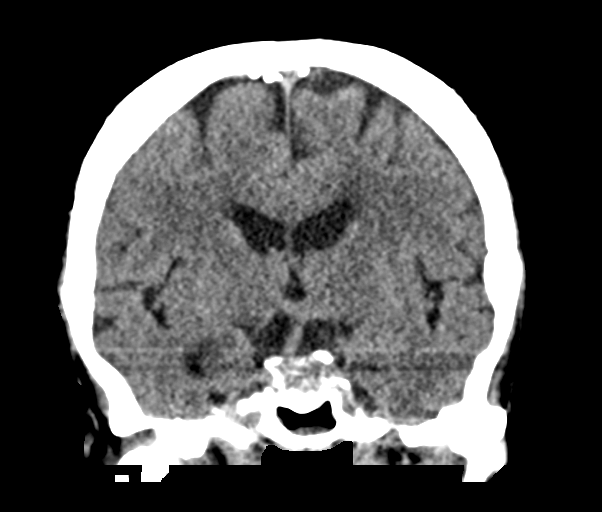
[im 40/72  brain]
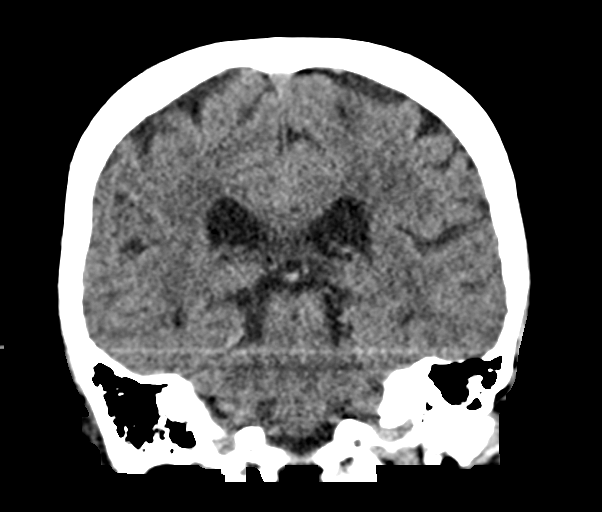

[Series 5: sag soft · sagittal · 0.29mm/px · 3 of 67 slices shown]
[im 23/67  brain]
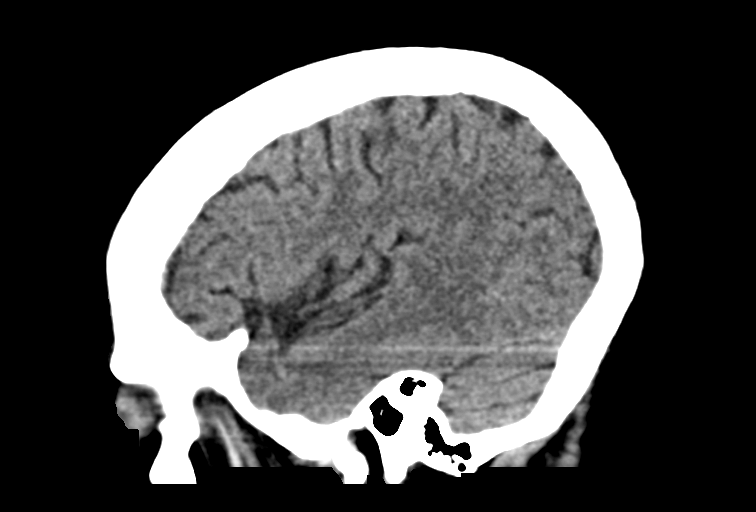
[im 34/67  brain]
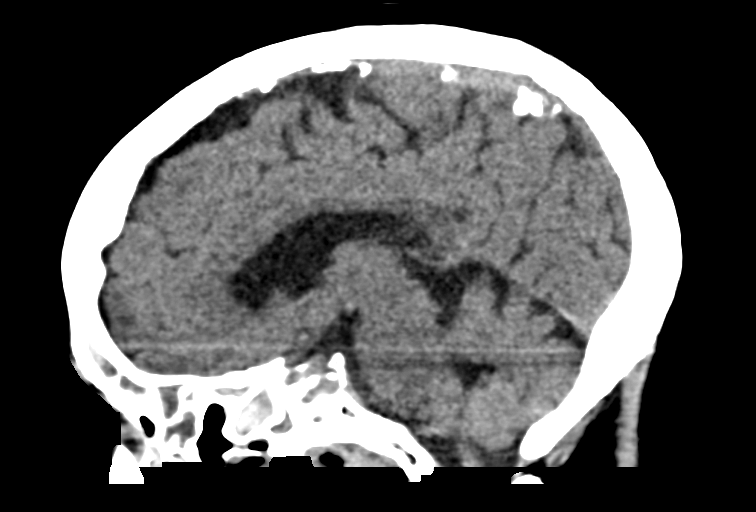
[im 45/67  brain]
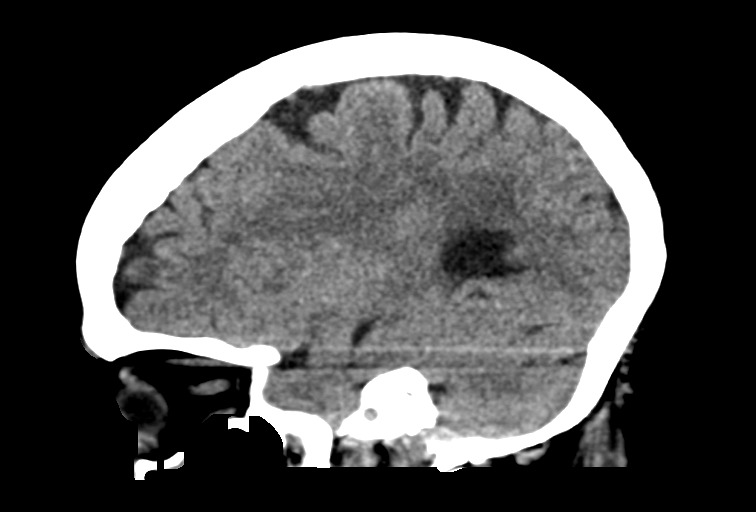

[15 of 46 positions shown; findings below may reference images not displayed]

FINDINGS: Brain: Mild chronic ischemic white matter disease is noted. No mass
effect or midline shift is noted. Ventricular size is within normal
limits. There is no evidence of mass lesion, hemorrhage or acute
infarction.

Vascular: No hyperdense vessel or unexpected calcification.

Skull: Normal. Negative for fracture or focal lesion.

Sinuses/Orbits: Pan sinusitis is noted.

Other: None.
IMPRESSION: Mild chronic ischemic white matter disease. Pan sinusitis. No acute
intracranial abnormality seen.

## 2022-03-09 DIAGNOSIS — J338 Other polyp of sinus: Secondary | ICD-10-CM | POA: Diagnosis not present

## 2022-03-09 DIAGNOSIS — J343 Hypertrophy of nasal turbinates: Secondary | ICD-10-CM | POA: Diagnosis not present

## 2022-03-09 DIAGNOSIS — J342 Deviated nasal septum: Secondary | ICD-10-CM | POA: Diagnosis not present

## 2022-03-09 DIAGNOSIS — H9012 Conductive hearing loss, unilateral, left ear, with unrestricted hearing on the contralateral side: Secondary | ICD-10-CM | POA: Diagnosis not present

## 2022-03-09 DIAGNOSIS — J310 Chronic rhinitis: Secondary | ICD-10-CM | POA: Diagnosis not present

## 2022-03-09 DIAGNOSIS — H6982 Other specified disorders of Eustachian tube, left ear: Secondary | ICD-10-CM | POA: Diagnosis not present

## 2022-03-09 DIAGNOSIS — H6123 Impacted cerumen, bilateral: Secondary | ICD-10-CM | POA: Diagnosis not present

## 2022-04-14 ENCOUNTER — Ambulatory Visit: Payer: Medicare Other | Admitting: Allergy & Immunology

## 2022-04-14 ENCOUNTER — Encounter: Payer: Self-pay | Admitting: Allergy & Immunology

## 2022-04-14 VITALS — BP 140/86 | HR 99 | Temp 98.8°F | Resp 20 | Ht 60.0 in | Wt 166.0 lb

## 2022-04-14 DIAGNOSIS — J330 Polyp of nasal cavity: Secondary | ICD-10-CM

## 2022-04-14 DIAGNOSIS — J310 Chronic rhinitis: Secondary | ICD-10-CM | POA: Diagnosis not present

## 2022-04-14 DIAGNOSIS — R768 Other specified abnormal immunological findings in serum: Secondary | ICD-10-CM | POA: Diagnosis not present

## 2022-04-14 DIAGNOSIS — J4540 Moderate persistent asthma, uncomplicated: Secondary | ICD-10-CM

## 2022-04-14 MED ORDER — MONTELUKAST SODIUM 10 MG PO TABS: 10.0000 mg | ORAL_TABLET | Freq: Every day | ORAL | 5 refills | Status: DC

## 2022-04-14 MED ORDER — FLUTICASONE-SALMETEROL 250-50 MCG/ACT IN AEPB: 1.0000 | INHALATION_SPRAY | Freq: Two times a day (BID) | RESPIRATORY_TRACT | 5 refills | Status: DC

## 2022-04-14 MED ORDER — ALBUTEROL SULFATE HFA 108 (90 BASE) MCG/ACT IN AERS: 2.0000 | INHALATION_SPRAY | Freq: Four times a day (QID) | RESPIRATORY_TRACT | 1 refills | Status: DC | PRN

## 2022-04-14 NOTE — Progress Notes (Addendum)
? ?NEW PATIENT ? ?Date of Service/Encounter:  04/14/22 ? ?Consult requested by: Orpah Melter, MD ? ? ?Assessment:  ? ?Moderate persistent asthma, uncomplicated ? ?Chronic rhinitis - s/p years of allergen immunotherapy ? ?Polyp of nasal cavity - resolves with prednisone use (followed by Dr. Benjamine Mola) ? ?Plan/Recommendations:  ? ?1. Moderate persistent asthma, uncomplicated ?- Lung testing looks terrible with an FEV1 in the 40% range, but it did improve with the albuterol treatment. ?- We are going to get some lab work to look for serious causes of difficult to control asthma. ?- We are not going to make any medication changes at this point in time, but I would like to start prednisone to see if this reverses your lung findings. ?- Start the prednisone dose pack provided today. ?- Daily controller medication(s): Wixela 250/72mg one puff twice daily + Singulair (montelukast) '10mg'$  daily ?- Prior to physical activity: albuterol 2 puffs 10-15 minutes before physical activity. ?- Rescue medications: albuterol 4 puffs every 4-6 hours as needed ?- Asthma control goals:  ?* Full participation in all desired activities (may need albuterol before activity) ?* Albuterol use two time or less a week on average (not counting use with activity) ?* Cough interfering with sleep two time or less a month ?* Oral steroids no more than once a year ?* No hospitalizations ? ?2. 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 ?- We will call you in 1-2 weeks with the results of the testing. ?- Continue with Flonase one spray per nostril daily. ?- Consider Dupixent for long term control of your asthma and nasal polyps.  ? ?3. Return in about 2 weeks (around 04/28/2022).  ? ? ?This note in its entirety was forwarded to the Provider who requested this consultation. ? ?Subjective:  ? ?EHarlym Chase is a 87y.o. female presenting today for evaluation of  ?Chief Complaint  ?Patient  presents with  ? Establish Care  ? ? ?[name] has a history of the following: ?Patient Active Problem List  ? Diagnosis Date Noted  ? 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 Osteoarthritis of right hip 09/11/2020  ? Obstructive lung disease ([place] 08/31/2015  ? H/O allergic rhinitis 08/31/2015  ? ? ?History obtained from: chart review and patient. ? ?[name] was referred by MOrpah Melter MD.    ? ?EAnastasijais a 87y.o. female presenting for an evaluation of asthma, allergies, and nasal congestion . ?  ?Asthma/Respiratory Symptom History: She apparently had allergy shots for a long period of time. This helped with her asthma symptoms and decreased her hospitalizations. She was diagnosed when she was an infant. She was diagnosed with pneumonia as an infant. She does not know the details for this at all.  She would use a lot of Vicks (both topical and PO apparently). She is now on Wixela and Singulair. She was previously on Advair. She thinks that her breathing has been under good control as of late. She thinks that her asthma is under fair control. She has not been on prednisone for her breathing. She has been trying to exercise more. She estimes that she uses her inhaler twice per week.  ? ?[facility] and [facility] was told that her CXR was "cloudy".  She has never had any cardiac issues.  EKG was normal per the patient. ? ?Allergic Rhinitis Symptom History: She apparently has a diagnosis of nasal  polyps. She has bene treated with prednisone.  She apparently has been to see ENT this year and this is where she was diagnosed with nasal polyps. She is followed by Dr. Benjamine Mola. She is now on fluticasone for her nasal polyps. Recently her symptoms have bene a problem. She has not had any allergy shots for years. ? ?Otherwise, there is no history of other atopic diseases, including food allergies, drug allergies, stinging insect allergies, eczema, urticaria, or contact dermatitis. There is  no significant infectious history. Vaccinations are up to date.  ? ? ?Past Medical History: ?Patient Active Problem List  ? Diagnosis Date Noted  ? 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 Osteoarthritis of right hip 09/11/2020  ? Obstructive lung disease ([place]) 08/31/2015  ? H/O allergic rhinitis 08/31/2015  ? ? ?Medication List:  ?Allergies as of 04/14/2022   ? ?   Reactions  ? Penicillins Shortness Of Breath, Rash  ? Keflex [cephalexin] Itching  ? Sulfa Antibiotics Itching, Other (See Comments)  ? Swelling in mouth  ? Other Itching  ? Some beans   ? ?  ? ?  ?Medication List  ?  ? ?  ? Accurate as of April 14, 2022 12:46 PM. If you have any questions, ask your nurse or doctor.  ?  ?  ? ?  ? ?STOP taking these medications   ? ?Wixela Inhub 250-50 MCG/ACT Aepb ?Generic drug: fluticasone-salmeterol ?Stopped by: Valentina Shaggy, MD ?  ? ?  ? ?TAKE these medications   ? ?albuterol 108 (90 Base) MCG/ACT inhaler ?Commonly known as: VENTOLIN HFA ?Inhale 2 puffs into the lungs every 6 (six) hours as needed for wheezing. ?  ?aspirin EC 81 MG tablet ?Take 1 tablet (81 mg total) by mouth 2 (two) times daily. ?  ?diclofenac Sodium 1 % Gel ?Commonly known as: VOLTAREN ?Apply 2 g topically daily as needed (Pain). ?  ?lisinopril 40 MG tablet ?Commonly known as: ZESTRIL ?Take 40 mg by mouth daily. ?  ?magnesium gluconate 500 MG tablet ?Commonly known as: MAGONATE ?Take 500 mg by mouth daily. ?  ?montelukast 10 MG tablet ?Commonly known as: SINGULAIR ?Take 10 mg by mouth daily. ?  ?oxyCODONE-acetaminophen 5-325 MG tablet ?Commonly known as: PERCOCET/ROXICET ?Take 1 tablet by mouth every 4 (four) hours as needed for severe pain. ?  ?PRESERVISION AREDS 2 PO ?Take 2 tablets by mouth daily in the afternoon. ?  ?tiZANidine 2 MG tablet ?Commonly known as: ZANAFLEX ?Take 1 tablet (2 mg total) by mouth every 6 (six) hours as needed. ?  ?Vitamin B-12 5000 MCG Tbdp ?Take 5,000 mcg by mouth daily. ?  ?Vitamin D3 25 MCG (1000 UT) Caps ?Take  1,000 Units by mouth daily in the afternoon. ?  ? ?  ? ? ?Birth History: non-contributory ? ?Developmental History: non-contributory ? ?Past Surgical History: ?Past Surgical History:  ?Procedure Laterality Date  ? ANKLE SURGERY Right 02  ? pin  ? BREAST BIOPSY Right 11/01/2013  ? Procedure: RIGHT BREAST MASS EXCISION;  Surgeon: Rolm Bookbinder, MD;  Location: [facility];  Service: General;  Laterality: Right;  ? ELBOW SURGERY Right   ? pin  ? MULTIPLE TOOTH EXTRACTIONS    ? TOTAL HIP ARTHROPLASTY Right 09/16/2020  ? Procedure: RIGHT TOTAL HIP ARTHROPLASTY ANTERIOR APPROACH;  Surgeon: Frederik Pear, MD;  Location: [facility];  Service: Orthopedics;  Laterality: Right;  ? TUBAL LIGATION    ? ? ? ?Family History: ?History reviewed. No pertinent family history. ? ? ?Social History:  Sheletha lives at home with her family.  She lives in a house that is 85 years old. There is vinyl flooring throughout the home.  They have electric heating and central cooling.  There are no animals inside or outside of the home.  There are no dust mite covers on the bedding.  There is no tobacco exposure.  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  She has been retired for 18 years. ? ? ?Review of Systems  ?Constitutional: Negative.  Negative for chills, fever, malaise/fatigue and weight loss.  ?HENT: Negative.  Negative for congestion, ear discharge, ear pain and sinus pain.   ?Eyes:  Negative for pain, discharge and redness.  ?Respiratory:  Negative for cough, sputum production, shortness of breath and wheezing.   ?Cardiovascular: Negative.  Negative for chest pain and palpitations.  ?Gastrointestinal:  Negative for abdominal pain, constipation, diarrhea, heartburn, nausea and vomiting.  ?Skin: Negative.  Negative for itching and rash.  ?Neurological:  Negative for dizziness and headaches.  ?Endo/Heme/Allergies:  Negative for environmental allergies. Does not bruise/bleed easily.   ? ? ? ?Objective:  ? ?Blood pressure 140/86,  pulse 99, temperature 98.8 ?F (37.1 ?C), resp. rate 20, height 5' (1.524 m), weight 166 lb (75.3 kg), SpO2 98 %. ?Body mass index is 32.42 kg/m?. ? ? ? ? ?Physical Exam ?Constitutional:   ?   Appearance: S

## 2022-04-14 NOTE — Patient Instructions (Addendum)
1. Moderate persistent asthma, uncomplicated ?- Lung testing looks terrible with an FEV1 in the 40% range, but it did improve with the albuterol treatment. ?- We are going to get some lab work to look for serious causes of difficult to control asthma. ?- We are not going to make any medication changes at this point in time, but I would like to start prednisone to see if this reverses your lung findings. ?- Start the prednisone dose pack provided today. ?- Daily controller medication(s): Wixela 250/7mg one puff twice daily + Singulair (montelukast) '10mg'$  daily ?- Prior to physical activity: albuterol 2 puffs 10-15 minutes before physical activity. ?- Rescue medications: albuterol 4 puffs every 4-6 hours as needed ?- Asthma control goals:  ?* Full participation in all desired activities (may need albuterol before activity) ?* Albuterol use two time or less a week on average (not counting use with activity) ?* Cough interfering with sleep two time or less a month ?* Oral steroids no more than once a year ?* No hospitalizations ? ?2. 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 ?- We will call you in 1-2 weeks with the results of the testing. ?- Continue with Flonase one spray per nostril daily. ?- Consider Dupixent for long term control of your asthma and nasal polyps.  ? ?3. Return in about 2 weeks (around 04/28/2022).  ? ? ?Please inform u[place]of any Emergency Department visits, hospitalizations, or changes in symptoms. Call u[place]oreabefore going to the ED for breathing or allergy symptoms since we might be able to fit you in for a sick visit. Feel free to contact u[place]oreaanytime with any questions, problems, or concerns. ? ?It was a pleasure to meet you today! ? ?Websites that have reliable patient information: ?1. American Academy of Asthma, Allergy, and Immunology: www.aaaai.org ?2. Food Allergy Research and Education (FARE): foodallergy.org ?3. Mothers of  Asthmatics: http://www.asthmacommunitynetwork.org ?4. [employer]of Allergy, Asthma, and Immunology: wMonthlyElectricBill.co.uk? ? ?COVID-19 Vaccine Information can be found at: [email or web address].[email or web address] questions related to vaccine distribution or appointments, please email [email or web address] or call [phone]  ? ?We realize that you might be concerned about having an allergic reaction to the COVID19 vaccines. To help with that concern, WE ARE OFFERING THE COVID19 VACCINES IN OUR OFFICE! Ask the front desk for dates!  ? ? ? ??Like? u[place]on Facebook and Instagram for our latest updates!  ?  ? ? ?A healthy democracy works best when ANew York Life Insuranceparticipate! Make sure you are registered to vote! If you have moved or changed any of your contact information, you will need to get this updated before voting! ? ?In some cases, you MAY be able to register to vote online: [email or web address]? ? ? ? ? ? ? ? ? ? ?

## 2022-04-22 LAB — ALLERGENS W/COMP RFLX AREA 2
Alternaria Alternata IgE: 0.35 kU/L — AB
Aspergillus Fumigatus IgE: 0.31 kU/L — AB
Bermuda Grass IgE: 0.35 kU/L — AB
Cedar, Mountain IgE: 0.73 kU/L — AB
Cladosporium Herbarum IgE: 0.46 kU/L — AB
Cockroach, German IgE: 0.33 kU/L — AB
Common Silver Birch IgE: 0.53 kU/L — AB
Cottonwood IgE: 1.45 kU/L — AB
D Farinae IgE: 1.46 kU/L — AB
D Pteronyssinus IgE: 1.03 kU/L — AB
E001-IgE Cat Dander: 0.22 kU/L — AB
E005-IgE Dog Dander: 0.28 kU/L — AB
Elm, American IgE: 0.8 kU/L — AB
IgE (Immunoglobulin E), Serum: 33991 IU/mL — ABNORMAL HIGH (ref 6–495)
Johnson Grass IgE: 1.2 kU/L — AB
Maple/Box Elder IgE: 0.95 kU/L — AB
Mouse Urine IgE: 0.29 kU/L — AB
Oak, White IgE: 0.85 kU/L — AB
Pecan, Hickory IgE: 0.52 kU/L — AB
Penicillium Chrysogen IgE: 0.19 kU/L — AB
Pigweed, Rough IgE: 0.74 kU/L — AB
Ragweed, Short IgE: 0.25 kU/L — AB
Sheep Sorrel IgE Qn: 0.25 kU/L — AB
Timothy Grass IgE: 0.5 kU/L — AB
White Mulberry IgE: 0.89 kU/L — AB

## 2022-04-22 LAB — CBC WITH DIFFERENTIAL/PLATELET
Basophils Absolute: 0.1 10*3/uL (ref 0.0–0.2)
Basos: 1 %
EOS (ABSOLUTE): 0.5 10*3/uL — ABNORMAL HIGH (ref 0.0–0.4)
Eos: 6 %
Hematocrit: 38.7 % (ref 34.0–46.6)
Hemoglobin: 12.8 g/dL (ref 11.1–15.9)
Immature Grans (Abs): 0 10*3/uL (ref 0.0–0.1)
Immature Granulocytes: 0 %
Lymphocytes Absolute: 2.6 10*3/uL (ref 0.7–3.1)
Lymphs: 32 %
MCH: 31.4 pg (ref 26.6–33.0)
MCHC: 33.1 g/dL (ref 31.5–35.7)
MCV: 95 fL (ref 79–97)
Monocytes Absolute: 0.4 10*3/uL (ref 0.1–0.9)
Monocytes: 5 %
Neutrophils Absolute: 4.6 10*3/uL (ref 1.4–7.0)
Neutrophils: 56 %
Platelets: 216 10*3/uL (ref 150–450)
RBC: 4.07 x10E6/uL (ref 3.77–5.28)
RDW: 13 % (ref 11.7–15.4)
WBC: 8.2 10*3/uL (ref 3.4–10.8)

## 2022-04-22 LAB — ASPERGILLUS PRECIPITINS
A.Fumigatus #1 Abs: NEGATIVE
Aspergillus Flavus Antibodies: NEGATIVE
Aspergillus Niger Antibodies: NEGATIVE
Aspergillus glaucus IgG: NEGATIVE
Aspergillus nidulans IgG: NEGATIVE
Aspergillus terreus IgG: NEGATIVE

## 2022-04-22 LAB — ALPHA-1-ANTITRYPSIN: A-1 Antitrypsin: 151 mg/dL (ref 101–187)

## 2022-04-22 LAB — ANCA TITERS
Atypical pANCA: <1:20 {titer}
C-ANCA: <1:20 {titer}
P-ANCA: <1:20 {titer}

## 2022-04-24 NOTE — Addendum Note (Signed)
Addended by: Valentina Shaggy on: 04/24/2022 01:05 PM ? ? Modules accepted: Orders ? ?

## 2022-04-28 ENCOUNTER — Encounter: Payer: Self-pay | Admitting: Allergy & Immunology

## 2022-04-28 ENCOUNTER — Ambulatory Visit: Payer: Medicare Other | Admitting: Allergy & Immunology

## 2022-04-28 VITALS — BP 166/90 | HR 98 | Temp 97.9°F | Resp 16 | Wt 162.8 lb

## 2022-04-28 DIAGNOSIS — R768 Other specified abnormal immunological findings in serum: Secondary | ICD-10-CM

## 2022-04-28 DIAGNOSIS — J310 Chronic rhinitis: Secondary | ICD-10-CM

## 2022-04-28 DIAGNOSIS — J330 Polyp of nasal cavity: Secondary | ICD-10-CM | POA: Diagnosis not present

## 2022-04-28 DIAGNOSIS — J4540 Moderate persistent asthma, uncomplicated: Secondary | ICD-10-CM | POA: Diagnosis not present

## 2022-04-28 NOTE — Patient Instructions (Addendum)
1. Moderate persistent asthma, uncomplicated ?- Lung testing looks terrible again. ?- We are going to provide you with a sample of Trelegy (contains THREE medications to help with breathing). ?- This will REPLACE the [facility] ?- Consent for [place] signed today. ?- We are going to get another chest X-ray today as well to make sure that this has has cleared compared to the CXR From her PCP's visit late last year.   ?- Daily controller medication(s): Trelegy one puff once daily + Singulair (montelukast) '10mg'$  daily ?- Prior to physical activity: albuterol 2 puffs 10-15 minutes before physical activity. ?- Rescue medications: albuterol 4 puffs every 4-6 hours as needed ?- Asthma control goals:  ?* Full participation in all desired activities (may need albuterol before activity) ?* Albuterol use two time or less a week on average (not counting use with activity) ?* Cough interfering with sleep two time or less a month ?* Oral steroids no more than once a year ?* No hospitalizations ? ?2. 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 ?- We will call you in 1-2 weeks with the results of the testing. ?- Continue with Flonase one spray per nostril daily. ?- The Dupixent should help with nasal polyps and the breathing. ? ?3. Elevated IgE  ?- We are going to get some more labs to make sure you do not have any evidence of blood cancers, which can present with elevated IgE. ?- I do not think that this is the case, but we need to make sure.  ? ?4. Return in about 6 weeks (around 06/09/2022).  ? ? ?Please inform us of any Emergency Department visits, hospitalizations, or changes in symptoms. Call us before going to the ED for breathing or allergy symptoms since we might be able to fit you in for a sick visit. Feel free to contact us anytime with any questions, problems, or concerns. ? ?It was a pleasure to see you again today! ? ?Websites that have reliable patient  information: ?1. American Academy of Asthma, Allergy, and Immunology: www.aaaai.org ?2. Food Allergy Research and Education (FARE): foodallergy.org ?3. Mothers of Asthmatics: http://www.asthmacommunitynetwork.org ?4. [employer] of Allergy, Asthma, and Immunology: MonthlyElectricBill.co.uk ? ? ?COVID-19 Vaccine Information can be found at: ShippingScam.co.uk For questions related to vaccine distribution or appointments, please email [email or web address] or call [phone].  ? ?We realize that you might be concerned about having an allergic reaction to the COVID19 vaccines. To help with that concern, WE ARE OFFERING THE COVID19 VACCINES IN OUR OFFICE! Ask the front desk for dates!  ? ? ? ??Like? [place] on Facebook and Instagram for our latest updates!  ?  ? ? ?A healthy democracy works best when [employer] participate! Make sure you are registered to vote! If you have moved or changed any of your contact information, you will need to get this updated before voting! ? ?In some cases, you MAY be able to register to vote online: [email or web address] ? ? ? ? ? ? ? ? ? ? ?

## 2022-04-28 NOTE — Progress Notes (Signed)
? ?FOLLOW UP ? ?Date of Service/Encounter:  04/28/22 ? ? ?Assessment:  ? ?Moderate persistent asthma, uncomplicated - looking into start [place] ?  ?Chronic rhinitis - s/p years of allergen immunotherapy ?  ?Polyp of nasal cavity - resolves with prednisone use (followed by Dr. Benjamine Mola) ?  ? ?Plan/Recommendations:  ? ?1. Moderate persistent asthma, uncomplicated ?- Lung testing looks terrible again. ?- We are going to provide you with a sample of Trelegy (contains THREE medications to help with breathing). ?- This will REPLACE the [facility] ?- Consent for [place] signed today. ?- We are going to get another chest X-ray today as well.  ?- Daily controller medication(s): Trelegy one puff once daily + Singulair (montelukast) '10mg'$  daily ?- Prior to physical activity: albuterol 2 puffs 10-15 minutes before physical activity. ?- Rescue medications: albuterol 4 puffs every 4-6 hours as needed ?- Asthma control goals:  ?* Full participation in all desired activities (may need albuterol before activity) ?* Albuterol use two time or less a week on average (not counting use with activity) ?* Cough interfering with sleep two time or less a month ?* Oral steroids no more than once a year ?* No hospitalizations ? ?2. 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 ?- We will call you in 1-2 weeks with the results of the testing. ?- Continue with Flonase one spray per nostril daily. ?- The Dupixent should help with nasal polyps and the breathing. ? ?3. Elevated IgE  ?- We are going to get some more labs to make sure you do not have any evidence of blood cancers, which can present with elevated IgE. ?- I do not think that this is the case, but we need to make sure.  ? ?4. Return in about 6 weeks (around 06/09/2022).  ? ? ? ?Subjective:  ? ?Mary Chase is a 87 y.o. female presenting today for follow up of  ?Chief Complaint  ?Patient presents with  ? Asthma  ?  Mostly SOB  and using inhaler. Ran out of medication and was switching pharmacy  ? Other  ?  Medication clarification. Fluticasone nasal and fluticasone oral. Patient is worried that this may be too much.  ? Allergic Rhinitis   ?  "To be honest, I don't know what type of allergies I have." Patient states that she did not get the results of her lab test because when she called back the office was closed.  ? ? ?Mary Chase has a history of the following: ?Patient Active Problem List  ? Diagnosis Date Noted  ? 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 Osteoarthritis of right hip 09/11/2020  ? Obstructive lung disease ([place]) 08/31/2015  ? H/O allergic rhinitis 08/31/2015  ? ? ?History obtained from: chart review and patient. ? ?Mary Chase is a 87 y.o. female presenting for a follow up visit.  She was last seen in April 2023.  At that time, her lung testing looked awful but improved with the albuterol treatment.  We continued with Wixela 250 mcg 1 puff twice daily and Singulair 10 mg daily.  We did obtain some labs to look for serious causes of asthma. AEC came back elevated at 500.  ? ?Her labs came back positive for an IgE of nearly 34,000.  We added on an SPEP and UPEP as well as immunoglobulins.  ? ?Since the last visit, she has done fairly well. She has not been  ? ?Asthma/Respiratory Symptom  History: She changed pharmacies and was changed the [place] from [place].  She decided that she did not want to take the [facility] and she stopped it two weeks ago. She was concerned that she was "over dosing" on the fluticasone containing medications. She has been on the Advair for years or more. She does not remember the last time that she started. She is not having difficulty breathing without the Wixela, at least nothing more normal than her baseline.  ? ?She did have a CXR at [facility]. Apparently they were "cloudy" and she was treated with an antibiotic and prednisone. She did not get a f/u CXR. ? ?Allergic Rhinitis Symptom  History: She reports that she has a lot of rhinorrhea which has calmed down.  We did go over her environmental allergy panel that demonstrated multiple positives, although I felt that a lot of this was non-specific binding given the low IgE levels across the board. She remains on the Flonase one spray per nostril daily. Nasal polyps are still present. She sees Dr. Benjamine Mola and was told that her polyps were getting better without an addition surgery (she has had two surgeries for her polyps in her lifetime.  ? ?She denies a history of cancer. She gets regular screenings during her PCP visits. She has never been told that she might have cancer. ? ?Otherwise, there have been no changes to her past medical history, surgical history, family history, or social history. ? ? ? ?Review of Systems  ?Constitutional: Negative.  Negative for chills, fever, malaise/fatigue and weight loss.  ?HENT: Negative.  Negative for congestion, ear discharge, ear pain and sinus pain.   ?Eyes:  Negative for pain, discharge and redness.  ?Respiratory:  Positive for cough and shortness of breath. Negative for sputum production and wheezing.   ?Cardiovascular: Negative.  Negative for chest pain and palpitations.  ?Gastrointestinal:  Negative for abdominal pain, constipation, diarrhea, heartburn, nausea and vomiting.  ?Skin: Negative.  Negative for itching and rash.  ?Neurological:  Negative for dizziness and headaches.  ?Endo/Heme/Allergies:  Negative for environmental allergies. Does not bruise/bleed easily.   ? ? ? ?Objective:  ? ?Blood pressure (!) 166/90, pulse 98, temperature 97.9 ?F (36.6 ?C), temperature source Temporal, resp. rate 16, weight 162 lb 12.8 oz (73.8 kg), SpO2 97 %. ?Body mass index is 31.79 kg/m?. ? ? ? ?Physical Exam ?Vitals reviewed.  ?Constitutional:   ?   Appearance: She is well-developed.  ?   Comments: Very pleasant female.  Extremely talkative. Feisty.   ?HENT:  ?   Head: Normocephalic and atraumatic.  ?   Right Ear:  Tympanic membrane, ear canal and external ear normal. No drainage, swelling or tenderness. Tympanic membrane is not injected, scarred, erythematous, retracted or bulging.  ?   Left Ear: Tympanic membrane, ear canal and external ear normal. No drainage, swelling or tenderness. Tympanic membrane is not injected, scarred, erythematous, retracted or bulging.  ?   Nose: Mucosal edema and rhinorrhea present. No nasal deformity or septal deviation.  ?   Right Turbinates: Enlarged, swollen and pale.  ?   Left Turbinates: Enlarged, swollen and pale.  ?   Right Sinus: No maxillary sinus tenderness or frontal sinus tenderness.  ?   Left Sinus: No maxillary sinus tenderness or frontal sinus tenderness.  ?   Comments: Large polyp in the left nostril obscuring 100% of the nasal cavity. ?   Mouth/Throat:  ?   Mouth: Mucous membranes are not pale and not dry.  ?  Pharynx: Uvula midline.  ?Eyes:  ?   General:     ?   Right eye: No discharge.     ?   Left eye: No discharge.  ?   Conjunctiva/sclera: Conjunctivae normal.  ?   Right eye: Right conjunctiva is not injected. No chemosis. ?   Left eye: Left conjunctiva is not injected. No chemosis. ?   Pupils: Pupils are equal, round, and reactive to light.  ?Cardiovascular:  ?   Rate and Rhythm: Normal rate and regular rhythm.  ?   Heart sounds: Normal heart sounds.  ?Pulmonary:  ?   Effort: Pulmonary effort is normal. No tachypnea, accessory muscle usage or respiratory distress.  ?   Breath sounds: Decreased air movement present. No wheezing, rhonchi or rales.  ?   Comments: Decreased air movement at the bases. ?Chest:  ?   Chest wall: No tenderness.  ?Abdominal:  ?   Tenderness: There is no abdominal tenderness. There is no guarding or rebound.  ?Lymphadenopathy:  ?   Head:  ?   Right side of head: No submandibular, tonsillar or occipital adenopathy.  ?   Left side of head: No submandibular, tonsillar or occipital adenopathy.  ?   Cervical: No cervical adenopathy.  ?Skin: ?    Coloration: Skin is not pale.  ?   Findings: No abrasion, erythema, petechiae or rash. Rash is not papular, urticarial or vesicular.  ?Neurological:  ?   Mental Status: She is alert.  ?Psychiatric:     ?   Behavior:

## 2022-04-29 ENCOUNTER — Telehealth: Payer: Self-pay

## 2022-04-29 ENCOUNTER — Encounter: Payer: Self-pay | Admitting: Allergy & Immunology

## 2022-04-29 NOTE — Telephone Encounter (Signed)
Patient called in today needing clarification on what the AVS said about her getting an X-ray. Read AVS and clarified that Dr. Ernst Bowler ordered an X-ray yesterday for here and gave her the address and phone number of the location [facility]) that is on the order. ?

## 2022-04-30 ENCOUNTER — Ambulatory Visit
Admission: RE | Admit: 2022-04-30 | Discharge: 2022-04-30 | Disposition: A | Discharge status: Home / Self Care | Payer: Medicare Other | Source: Ambulatory Visit | Attending: Allergy & Immunology | Admitting: Allergy & Immunology

## 2022-04-30 ENCOUNTER — Telehealth: Payer: Self-pay | Admitting: *Deleted

## 2022-04-30 DIAGNOSIS — J4540 Moderate persistent asthma, uncomplicated: Secondary | ICD-10-CM

## 2022-04-30 DIAGNOSIS — R0602 Shortness of breath: Secondary | ICD-10-CM | POA: Diagnosis not present

## 2022-04-30 DIAGNOSIS — R768 Other specified abnormal immunological findings in serum: Secondary | ICD-10-CM | POA: Diagnosis not present

## 2022-04-30 LAB — PROTEIN ELECTROPHORESIS, SERUM, WITH REFLEX
A/G Ratio: 1 (ref 0.7–1.7)
Albumin ELP: 3.5 g/dL (ref 2.9–4.4)
Alpha 1: 0.2 g/dL (ref 0.0–0.4)
Alpha 2: 0.8 g/dL (ref 0.4–1.0)
Beta: 1.1 g/dL (ref 0.7–1.3)
Gamma Globulin: 1.3 g/dL (ref 0.4–1.8)
Globulin, Total: 3.5 g/dL (ref 2.2–3.9)
Total Protein: 7 g/dL (ref 6.0–8.5)

## 2022-04-30 LAB — IGG, IGA, IGM
IgA/Immunoglobulin A, Serum: 430 mg/dL — ABNORMAL HIGH (ref 64–422)
IgG (Immunoglobin G), Serum: 1312 mg/dL (ref 586–1602)
IgM (Immunoglobulin M), Srm: 63 mg/dL (ref 26–217)

## 2022-04-30 NOTE — Telephone Encounter (Signed)
Thank you for doing that!  ? ?Salvatore Marvel, MD ?[facility] ? ?

## 2022-04-30 NOTE — Telephone Encounter (Signed)
-----   Message from Valentina Shaggy, MD sent at 04/29/2022  3:20 PM EDT ----- ?[place] for asthma (and nasal polyps).  ?

## 2022-04-30 NOTE — Telephone Encounter (Signed)
L/M for patient to contact me to discuss PAP for Dupixent due to patient MCR coverage for affordablilty ?

## 2022-05-04 LAB — UPEP/TP, 24-HR URINE
Albumin, U: 100 %
Alpha 1, Urine: 0 %
Alpha 2, Urine: 0 %
Beta, Urine: 0 %
Gamma Globulin, Urine: 0 %
Protein, 24H Urine: 133 mg/24 hr (ref 30–150)
Protein, Ur: 14.8 mg/dL

## 2022-05-06 ENCOUNTER — Telehealth: Payer: Self-pay | Admitting: Allergy & Immunology

## 2022-05-06 NOTE — Telephone Encounter (Signed)
Spoke to patient and went over recent lab results with her again. Let patient know that the labs are normal and that there isn't any medication changes at this time. Patient stated that she did not see a difference between the Trelegy sample and the [facility] she has been on. She stated that when she starts back on the [facility] and sees any difference she will call back to get a prescription for Trelegy.  ?

## 2022-05-06 NOTE — Telephone Encounter (Signed)
Mary Chase called in and states she is just not understanding her lab results and would really like for someone to call her and explain them to her.  Please advise.  ?

## 2022-05-07 NOTE — Telephone Encounter (Signed)
Thanks much!  Meilin Brosh, MD [facility]  

## 2022-05-11 DIAGNOSIS — H903 Sensorineural hearing loss, bilateral: Secondary | ICD-10-CM | POA: Diagnosis not present

## 2022-05-11 DIAGNOSIS — J338 Other polyp of sinus: Secondary | ICD-10-CM | POA: Diagnosis not present

## 2022-05-11 DIAGNOSIS — H6983 Other specified disorders of Eustachian tube, bilateral: Secondary | ICD-10-CM | POA: Diagnosis not present

## 2022-05-11 DIAGNOSIS — J343 Hypertrophy of nasal turbinates: Secondary | ICD-10-CM | POA: Diagnosis not present

## 2022-05-11 DIAGNOSIS — J342 Deviated nasal septum: Secondary | ICD-10-CM | POA: Diagnosis not present

## 2022-05-14 NOTE — Telephone Encounter (Signed)
Patient called to request her CXR results. I told her that she had what looked like atelectasis in the RML. She is afebrile and denies chest pain. She has bene feeling SOB recently, but nothing worse than normal.   She is on prednisone given by her ENT for her polyps. She remains interested in [place] and will call Tammy to discuss the patient assistance. She is very needle phobic, but I reassured her that she will feel better.   Salvatore Marvel, MD [facility]

## 2022-05-14 NOTE — Telephone Encounter (Signed)
Great!  I am glad she reached out to you.

## 2022-05-14 NOTE — Telephone Encounter (Signed)
Spoke to patient regarding Dupixent for asthma will mail patient assistance app so she can get free drug

## 2022-05-22 ENCOUNTER — Other Ambulatory Visit: Payer: Self-pay | Admitting: *Deleted

## 2022-05-22 ENCOUNTER — Encounter: Payer: Self-pay | Admitting: *Deleted

## 2022-05-22 DIAGNOSIS — J309 Allergic rhinitis, unspecified: Secondary | ICD-10-CM | POA: Diagnosis not present

## 2022-05-22 DIAGNOSIS — I10 Essential (primary) hypertension: Secondary | ICD-10-CM | POA: Diagnosis not present

## 2022-05-22 MED ORDER — ALBUTEROL SULFATE HFA 108 (90 BASE) MCG/ACT IN AERS: 2.0000 | INHALATION_SPRAY | Freq: Four times a day (QID) | RESPIRATORY_TRACT | 1 refills | Status: DC | PRN

## 2022-05-22 NOTE — Telephone Encounter (Signed)
Patient called the office today and states she has been going over the [place] information. She believes she is not a fit for this medication as she is "not having the signs listed like asthma attacks, etc." She doesn't think she should star this injection. She is also wanting to know more about her CXR results and if her results are something she needs to worry about. Is she able to do anything to help with what was shown on her CXR?   She is requesting a refill on [facility] [address]

## 2022-05-22 NOTE — Telephone Encounter (Signed)
This encounter was created in error - please disregard.

## 2022-05-22 NOTE — Telephone Encounter (Signed)
RX for the rescue inhaler sent. See above message.

## 2022-06-11 ENCOUNTER — Ambulatory Visit: Payer: Medicare Other | Admitting: Allergy & Immunology

## 2022-06-11 ENCOUNTER — Encounter: Payer: Self-pay | Admitting: Allergy & Immunology

## 2022-06-11 VITALS — BP 170/100 | HR 100 | Temp 98.1°F | Resp 16 | Wt 167.0 lb

## 2022-06-11 DIAGNOSIS — J310 Chronic rhinitis: Secondary | ICD-10-CM

## 2022-06-11 DIAGNOSIS — J330 Polyp of nasal cavity: Secondary | ICD-10-CM

## 2022-06-11 DIAGNOSIS — J4540 Moderate persistent asthma, uncomplicated: Secondary | ICD-10-CM | POA: Diagnosis not present

## 2022-06-11 DIAGNOSIS — R768 Other specified abnormal immunological findings in serum: Secondary | ICD-10-CM

## 2022-06-11 MED ORDER — TRELEGY ELLIPTA 100-62.5-25 MCG/ACT IN AEPB: 1.0000 | INHALATION_SPRAY | Freq: Every day | RESPIRATORY_TRACT | 5 refills | Status: DC

## 2022-06-11 NOTE — Patient Instructions (Addendum)
1. Moderate persistent asthma, uncomplicated - Lung testing not done today since you were not feeling well.  - We are going to give you another sample of Trelegy to use in place of the [facility] since you have had trouble getting it.  - Daily controller medication(s): Trelegy 126mg one puff once daily + Singulair (montelukast) '10mg'$  daily - Prior to physical activity: albuterol 2 puffs 10-15 minutes before physical activity. - Rescue medications: albuterol 4 puffs every 4-6 hours as needed - Asthma control goals:  * Full participation in all desired activities (may need albuterol before activity) * Albuterol use two time or less a week on average (not counting use with activity) * Cough interfering with sleep two time or less a month * Oral steroids no more than once a year * No hospitalizations  2. 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 - The Dupixent would with nasal polyps and the breathing.  3. Elevated IgE  - Previous work up has been normal.  - Dupixent would also decrease your IgE level over time.   4. Right middle lobe opacity - We are going to order a chest CT to further figure out what is going on in your lungs.  5. Return in about 3 months (around 09/11/2022).    Please inform [place]of any Emergency Department visits, hospitalizations, or changes in symptoms. Call [place]abefore going to the ED for breathing or allergy symptoms since we might be able to fit you in for a sick visit. Feel free to contact u[place]anytime with any questions, problems, or concerns.  It was a pleasure to see you again today!  Websites that have reliable patient information: 1. American Academy of Asthma, Allergy, and Immunology: www.aaaai.org 2. Food Allergy Research and Education (FARE): foodallergy.org 3. Mothers of Asthmatics: http://www.asthmacommunitynetwork.org 4. American College of Allergy, Asthma, and Immunology: www.acaai.org   COVID-19  Vaccine Information can be found at: [email or web address] questions related to vaccine distribution or appointments, please email [email or web address] or call [phone]   We realize that you might be concerned about having an allergic reaction to the COVID19 vaccines. To help with that concern, WE ARE OFFERING THE COVID19 VACCINES IN OUR OFFICE! Ask the front desk for dates!     "Like" u[place]on Facebook and Instagram for our latest updates!      A healthy democracy works best when ANew York Life Insuranceparticipate! Make sure you are registered to vote! If you have moved or changed any of your contact information, you will need to get this updated before voting!  In some cases, you MAY be able to register to vote online: [email or web address]

## 2022-06-11 NOTE — Progress Notes (Signed)
FOLLOW UP  Date of Service/Encounter:  06/11/22   Assessment:   Moderate persistent asthma, uncomplicated - looking into start Dupixent   Chronic rhinitis (grasses, weeds, ragweed, trees, molds, dust mites, roach, cat, dog) - s/p years of allergen immunotherapy  Elevated IgE - with normal SPEP and UPEP (rechecking IgE level today per patient request)   Polyp of left nasal cavity - much better with the regular use of fluticasone nasal spray   I continue to think that [place] would be a good addition to her treatment. Mary Chase is very hesitant to start it, but it would address her breathing, her nasal polyps, her itching, and her elevated IgE level. With that high of an IgE, I worry about something like cutaneous T-cell lymphoma. However, without the rash at all, I am not sure that we could get a Dermatologist to do a biopsy and we know that biopsies can be normal with CTCL. We are going to get a repeat IgE level to confirm that this was not a lab error, as the patient conjectures.   Plan/Recommendations:   1. Moderate persistent asthma, uncomplicated - Lung testing not done today since you were not feeling well.  - We are going to give you another sample of Trelegy to use in place of the [facility] since you have had trouble getting it.  - Daily controller medication(s): Trelegy 175mg one puff once daily + Singulair (montelukast) '10mg'$  daily - Prior to physical activity: albuterol 2 puffs 10-15 minutes before physical activity. - Rescue medications: albuterol 4 puffs every 4-6 hours as needed - Asthma control goals:  * Full participation in all desired activities (may need albuterol before activity) * Albuterol use two time or less a week on average (not counting use with activity) * Cough interfering with sleep two time or less a month * Oral steroids no more than once a year * No hospitalizations  2. 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 - The Dupixent would with nasal polyps and the breathing.  3. Elevated IgE  - Previous work up has been normal.  - Dupixent would also decrease your IgE level over time.   4. Right middle lobe opacity - We are going to order a chest CT to further figure out what is going on in your lungs.  5. Return in about 3 months (around 09/11/2022).     Subjective:   Mary Chase is a 87y.o. female presenting today for follow up of  Chief Complaint  Patient presents with   Asthma    Out of rescue inhaler and can't get one until the 24th of this month per patient.   Other    Patient states that Mary Chase feels like Mary Chase is coming down with a cold because Mary Chase has to cough more than usual.    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  Mary Chase a 87y.o. female presenting for a follow up visit. Mary Chase was last seen in May 2023. At that time, lung testing looked terrible. We started Trelegy in lieu of her [employer] We discussed starting Dupixent. For his rhinitis and nasal polyps, we obtained blood  work and continued  with Flonase one spray per nostril.  Mary Chase had an elevated IgE.  We did a work-up including an SPEP and a UPEP which were largely normal.  In the interim, Mary Chase declined starting Dupixent. Mary Chase read through in the information and did not feel that her clinical state warranted this at all.   Asthma/Respiratory Symptom History: Mary Chase reports that Mary Chase becomes short winded when Mary Chase walks a little ways. Mary Chase will get tired and use it again. Mary Chase has been using in lieu of [place]. Mary Chase has not had W[place]ella in a few weeks because the pharmacy delivered it late. Mary Chase has been using her rescue inhaler. Mary Chase normally does not  need it much at all. Before, this has not used her rescue inhaler for a good period of time.   Mary Chase is having some shortness or breath. Mary Chase is not coughing up a lot of mucous at all. Overall Mary Chase feels much better than Mary Chase did last time we saw her, at least until her pharmacy stopped sending her [place] more regularly.   Mary Chase does not want to inject anything else into her body. Mary Chase is concerned because Mary Chase read the indications and did not think that her asthma was severe enough to warrant this.   Allergic Rhinitis Symptom History: Mary Chase is using the Flonase one spray per nostril daily. Mary Chase is able to breathe through her nose without a problem. Mary Chase has not needed prednisone for her symptoms. Mary Chase just does not want to introduce anything else into her body. Mary Chase is used to the Advair, but now Mary Chase has been changed to the [employer]  We had changed her Trelegy and Mary Chase could not tell the difference in at all.   Skin Symptom History: Mary Chase denies a rash at all. Mary Chase has been itching on her arm and Mary Chase feels like there might be a rash.  But then nothing pops up. Mary Chase has never had a rah with all of this. SPEP and UPEP were completely negative.  Mary Chase denies any history of recurrent infections. Mary Chase has never had any teeth issues, aside from that expected with someone in her 40s.   Otherwise, there have been no changes to her past medical history, surgical history, family history, or social history.    Review of Systems  Constitutional: Negative.  Negative for chills, fever, malaise/fatigue and weight loss.  HENT: Negative.  Negative for congestion, ear discharge and ear pain.   Eyes:  Negative for pain, discharge and redness.  Respiratory:  Positive for cough and shortness of breath. Negative for sputum production and wheezing.   Cardiovascular: Negative.  Negative for chest pain and palpitations.  Gastrointestinal:  Negative for abdominal pain, constipation, diarrhea, heartburn, nausea and vomiting.  Skin: Negative.   Negative for itching and rash.  Neurological:  Negative for dizziness and headaches.  Endo/Heme/Allergies:  Negative for environmental allergies. Does not bruise/bleed easily.       Objective:   Blood pressure (!) 170/100, pulse 100, temperature 98.1 F (36.7 C), temperature source Temporal, resp. rate 16, weight 167 lb (75.8 kg), SpO2 97 %. Body mass index is 32.61 kg/m.    Physical Exam Constitutional:      Appearance: Mary Chase is well-developed.     Comments: Very pleasant female.  Extremely talkative.  HENT:     Head: Normocephalic and atraumatic.     Right Ear: Tympanic membrane, ear canal and external ear normal. No drainage, swelling or tenderness. Tympanic membrane is not injected, scarred, erythematous, retracted or bulging.  Left Ear: Tympanic membrane, ear canal and external ear normal. No drainage, swelling or tenderness. Tympanic membrane is not injected, scarred, erythematous, retracted or bulging.     Nose: Mucosal edema and rhinorrhea present. No nasal deformity or septal deviation.     Right Turbinates: Enlarged, swollen and pale.     Left Turbinates: Enlarged, swollen and pale.     Right Sinus: No maxillary sinus tenderness or frontal sinus tenderness.     Left Sinus: No maxillary sinus tenderness or frontal sinus tenderness.     Comments: Polyp on the left much smaller, obstructing approximately 50% of the nasal cavity.     Mouth/Throat:     Mouth: Mucous membranes are not pale and not dry.     Pharynx: Uvula midline.  Eyes:     General:        Right eye: No discharge.        Left eye: No discharge.     Conjunctiva/sclera: Conjunctivae normal.     Right eye: Right conjunctiva is not injected. No chemosis.    Left eye: Left conjunctiva is not injected. No chemosis.    Pupils: Pupils are equal, round, and reactive to light.  Cardiovascular:     Rate and Rhythm: Normal rate and regular rhythm.     Heart sounds: Normal heart sounds.  Pulmonary:     Effort:  Pulmonary effort is normal. No tachypnea, accessory muscle usage or respiratory distress.     Breath sounds: Decreased air movement present. No wheezing, rhonchi or rales.     Comments: Decreased air movement at the bases. Chest:     Chest wall: No tenderness.  Lymphadenopathy:     Head:     Right side of head: No submandibular, tonsillar or occipital adenopathy.     Left side of head: No submandibular, tonsillar or occipital adenopathy.     Cervical: No cervical adenopathy.  Skin:    Coloration: Skin is not pale.     Findings: No abrasion, erythema, petechiae or rash. Rash is not papular, urticarial or vesicular.  Neurological:     Mental Status: Mary Chase is alert.  Psychiatric:        Behavior: Behavior is cooperative.      Diagnostic studies: none        Salvatore Marvel, MD  [facility]

## 2022-06-16 LAB — IGE: IgE (Immunoglobulin E), Serum: 48710 IU/mL — ABNORMAL HIGH (ref 6–495)

## 2022-07-03 NOTE — Addendum Note (Signed)
Addended by: Valentina Shaggy on: 07/03/2022 05:56 AM   Modules accepted: Orders

## 2022-07-06 ENCOUNTER — Ambulatory Visit: Payer: Self-pay

## 2022-07-07 ENCOUNTER — Telehealth: Payer: Self-pay | Admitting: Hematology and Oncology

## 2022-07-07 NOTE — Telephone Encounter (Signed)
Scheduled appt per 7/14 referral. Pt is aware of appt date and time. Pt is aware to arrive 15 mins prior to appt time and to bring and updated insurance card. Pt is aware of appt location.   

## 2022-07-15 DIAGNOSIS — J342 Deviated nasal septum: Secondary | ICD-10-CM | POA: Diagnosis not present

## 2022-07-15 DIAGNOSIS — J310 Chronic rhinitis: Secondary | ICD-10-CM | POA: Diagnosis not present

## 2022-07-15 DIAGNOSIS — J338 Other polyp of sinus: Secondary | ICD-10-CM | POA: Diagnosis not present

## 2022-07-15 DIAGNOSIS — J343 Hypertrophy of nasal turbinates: Secondary | ICD-10-CM | POA: Diagnosis not present

## 2022-07-29 ENCOUNTER — Telehealth: Payer: Self-pay | Admitting: *Deleted

## 2022-07-29 NOTE — Telephone Encounter (Signed)
TCT patient to advise that she does not need to come to her scheduled appt with Dr. Lorenso Courier tomorrow. Advised that her allergist had already done the needed blood work for multiple myeloma and it was negative per Dr. Lorenso Courier.  Advised pt discuss with her allergist with further questions. She voiced understanding and will f/u with her allergist.

## 2022-07-30 ENCOUNTER — Telehealth: Payer: Self-pay

## 2022-07-30 ENCOUNTER — Encounter: Payer: Medicare Other | Admitting: Hematology and Oncology

## 2022-07-30 ENCOUNTER — Other Ambulatory Visit: Payer: Medicare Other

## 2022-07-30 NOTE — Telephone Encounter (Signed)
Patient called stating she is having nasal congestion/stuffy, Shortness of Breath and some yellowish mucous when she blows her nose. She is requesting a round of prednisone or antibiotics.   Patient also states the Cancer center called and cx her appointment for today stating the provider didn't think the appointment was necessary at this time. She wanted to know if they contacted you and what did you want to do next since she had the elevated blood work? Patient states she has been so worried since we did the referral, but now confused as to what her diagnosis is since they cx the appointment.   [facility] [address]

## 2022-07-31 ENCOUNTER — Telehealth: Payer: Self-pay | Admitting: Allergy & Immunology

## 2022-07-31 MED ORDER — DOXYCYCLINE MONOHYDRATE 100 MG PO TABS: 100.0000 mg | ORAL_TABLET | Freq: Two times a day (BID) | ORAL | 0 refills | Status: AC

## 2022-07-31 MED ORDER — PREDNISONE 10 MG PO TABS: ORAL_TABLET | ORAL | 0 refills | Status: DC

## 2022-07-31 MED ORDER — CLARITHROMYCIN 500 MG PO TABS: 500.0000 mg | ORAL_TABLET | Freq: Two times a day (BID) | ORAL | 0 refills | Status: AC

## 2022-07-31 NOTE — Telephone Encounter (Signed)
Spoke with patient, she stated that pharmacy informed her that the antibiotic contraindicated with her blood pressure medication and she would need another antibiotic sent in. She stated she did get the prednisone. She also stated that the doctor at the cancer center went over her chart and stated that he would consider her labs negative.

## 2022-07-31 NOTE — Telephone Encounter (Signed)
Dr. Ernst Bowler please advise:    [facility] from [facility] [address] stated that Dr. Ernst Bowler prescribed Clarithromycin for this patient but it interacts with Amlodipine '10mg'$  that patient is already on. Pharmacy would like to know if there is an alternative medication that patient can take. The call back number is [phone]

## 2022-07-31 NOTE — Telephone Encounter (Signed)
[facility] from [facility] on [address] stated that Dr. Ernst Bowler prescribed Clarithromycin for this patient but it interacts with Amlodipine '10mg'$  that patient is already on. Pharmacy would like to know if there is an alternative medication that patient can take. The call back number is [phone].

## 2022-07-31 NOTE — Addendum Note (Signed)
Addended by: Valentina Shaggy on: 07/31/2022 12:54 PM   Modules accepted: Orders

## 2022-07-31 NOTE — Telephone Encounter (Signed)
Patient has been informed her of the medication change. Patient verbalized understanding.

## 2022-07-31 NOTE — Telephone Encounter (Signed)
No they never contacted me about this. I am going to talk to my colleagues to see what they think. We can refer outside of [facility] if that is what we have to do. An IgE that high is not normal especially in an 87 yo.   I sent in prednisone and clarithromycin for her. Please let the patient know.   Salvatore Marvel, MD [facility] [place] [facility]

## 2022-07-31 NOTE — Telephone Encounter (Signed)
I sent in doxycycline instead.   Please advise.   Salvatore Marvel, MD [facility] [place]

## 2022-07-31 NOTE — Telephone Encounter (Signed)
I took care of this in a separate encounter.   Salvatore Marvel, MD [facility]

## 2022-08-27 ENCOUNTER — Encounter (HOSPITAL_COMMUNITY): Payer: Self-pay

## 2022-08-27 ENCOUNTER — Other Ambulatory Visit: Payer: Self-pay

## 2022-08-27 ENCOUNTER — Emergency Department (HOSPITAL_COMMUNITY)
Admission: EM | Admit: 2022-08-27 | Discharge: 2022-08-28 | Disposition: A | Discharge status: Home / Self Care | Payer: Medicare Other | Attending: Emergency Medicine | Admitting: Emergency Medicine

## 2022-08-27 DIAGNOSIS — R000 Tachycardia, unspecified: Secondary | ICD-10-CM | POA: Insufficient documentation

## 2022-08-27 DIAGNOSIS — I160 Hypertensive urgency: Secondary | ICD-10-CM

## 2022-08-27 DIAGNOSIS — Z7982 Long term (current) use of aspirin: Secondary | ICD-10-CM | POA: Insufficient documentation

## 2022-08-27 DIAGNOSIS — Z7951 Long term (current) use of inhaled steroids: Secondary | ICD-10-CM | POA: Diagnosis not present

## 2022-08-27 DIAGNOSIS — K529 Noninfective gastroenteritis and colitis, unspecified: Secondary | ICD-10-CM

## 2022-08-27 DIAGNOSIS — R109 Unspecified abdominal pain: Secondary | ICD-10-CM | POA: Diagnosis not present

## 2022-08-27 DIAGNOSIS — J45909 Unspecified asthma, uncomplicated: Secondary | ICD-10-CM | POA: Insufficient documentation

## 2022-08-27 DIAGNOSIS — Z79899 Other long term (current) drug therapy: Secondary | ICD-10-CM | POA: Diagnosis not present

## 2022-08-27 DIAGNOSIS — R531 Weakness: Secondary | ICD-10-CM | POA: Insufficient documentation

## 2022-08-27 DIAGNOSIS — R112 Nausea with vomiting, unspecified: Secondary | ICD-10-CM | POA: Diagnosis not present

## 2022-08-27 DIAGNOSIS — I10 Essential (primary) hypertension: Secondary | ICD-10-CM | POA: Diagnosis not present

## 2022-08-27 DIAGNOSIS — R079 Chest pain, unspecified: Secondary | ICD-10-CM | POA: Diagnosis not present

## 2022-08-27 DIAGNOSIS — Z20822 Contact with and (suspected) exposure to covid-19: Secondary | ICD-10-CM | POA: Insufficient documentation

## 2022-08-27 NOTE — ED Triage Notes (Addendum)
Patient said she has been feeling weak since last night. Has been vomiting, chest pain that is centralized, diarrhea. Feeling dizzy.

## 2022-08-28 ENCOUNTER — Encounter (HOSPITAL_COMMUNITY): Payer: Self-pay

## 2022-08-28 ENCOUNTER — Emergency Department (HOSPITAL_COMMUNITY): Payer: Medicare Other

## 2022-08-28 DIAGNOSIS — R109 Unspecified abdominal pain: Secondary | ICD-10-CM | POA: Diagnosis not present

## 2022-08-28 DIAGNOSIS — K529 Noninfective gastroenteritis and colitis, unspecified: Secondary | ICD-10-CM | POA: Diagnosis not present

## 2022-08-28 DIAGNOSIS — R079 Chest pain, unspecified: Secondary | ICD-10-CM | POA: Diagnosis not present

## 2022-08-28 LAB — LACTIC ACID, PLASMA
Lactic Acid, Venous: 1.4 mmol/L (ref 0.5–1.9)
Lactic Acid, Venous: 1.2 mmol/L (ref 0.5–1.9)

## 2022-08-28 LAB — CBC
HCT: 43.9 % (ref 36.0–46.0)
Hemoglobin: 15.1 g/dL — ABNORMAL HIGH (ref 12.0–15.0)
MCH: 31.5 pg (ref 26.0–34.0)
MCHC: 34.4 g/dL (ref 30.0–36.0)
MCV: 91.6 fL (ref 80.0–100.0)
Platelets: 289 10*3/uL (ref 150–400)
RBC: 4.79 MIL/uL (ref 3.87–5.11)
RDW: 12.8 % (ref 11.5–15.5)
WBC: 13 10*3/uL — ABNORMAL HIGH (ref 4.0–10.5)
nRBC: 0 % (ref 0.0–0.2)

## 2022-08-28 LAB — BASIC METABOLIC PANEL
Anion gap: 11 (ref 5–15)
BUN: 10 mg/dL (ref 8–23)
CO2: 28 mmol/L (ref 22–32)
Calcium: 9.9 mg/dL (ref 8.9–10.3)
Chloride: 104 mmol/L (ref 98–111)
Creatinine, Ser: 0.81 mg/dL (ref 0.44–1.00)
GFR, Estimated: >60 mL/min (ref >60)
Glucose, Bld: 149 mg/dL — ABNORMAL HIGH (ref 70–99)
Potassium: 3.2 mmol/L — ABNORMAL LOW (ref 3.5–5.1)
Sodium: 143 mmol/L (ref 135–145)

## 2022-08-28 LAB — HEPATIC FUNCTION PANEL
ALT: 10 U/L (ref 0–44)
AST: 17 U/L (ref 15–41)
Albumin: 3.9 g/dL (ref 3.5–5.0)
Alkaline Phosphatase: 116 U/L (ref 38–126)
Bilirubin, Direct: 0.1 mg/dL (ref 0.0–0.2)
Indirect Bilirubin: 0.7 mg/dL (ref 0.3–0.9)
Total Bilirubin: 0.8 mg/dL (ref 0.3–1.2)
Total Protein: 8.7 g/dL — ABNORMAL HIGH (ref 6.5–8.1)

## 2022-08-28 LAB — MAGNESIUM: Magnesium: 1.4 mg/dL — ABNORMAL LOW (ref 1.7–2.4)

## 2022-08-28 LAB — TROPONIN I (HIGH SENSITIVITY)
Troponin I (High Sensitivity): 5 ng/L (ref <18)
Troponin I (High Sensitivity): 6 ng/L (ref <18)

## 2022-08-28 LAB — SARS CORONAVIRUS 2 BY RT PCR: SARS Coronavirus 2 by RT PCR: NEGATIVE

## 2022-08-28 MED ORDER — LABETALOL HCL 5 MG/ML IV SOLN
20.0000 mg | Freq: Once | INTRAVENOUS | Status: AC
  Administered 2022-08-28: 20 mg via INTRAVENOUS
  Filled 2022-08-28: qty 4

## 2022-08-28 MED ORDER — ONDANSETRON HCL 4 MG/2ML IJ SOLN
4.0000 mg | Freq: Once | INTRAMUSCULAR | Status: AC
  Administered 2022-08-28: 4 mg via INTRAVENOUS
  Filled 2022-08-28: qty 2

## 2022-08-28 MED ORDER — SODIUM CHLORIDE 0.9 % IV BOLUS
1000.0000 mL | Freq: Once | INTRAVENOUS | Status: AC
  Administered 2022-08-28: 1000 mL via INTRAVENOUS

## 2022-08-28 MED ORDER — ONDANSETRON 8 MG PO TBDP: ORAL_TABLET | ORAL | 0 refills | Status: DC

## 2022-08-28 MED ORDER — SODIUM CHLORIDE (PF) 0.9 % IJ SOLN
INTRAMUSCULAR | Status: AC
  Filled 2022-08-28: qty 50

## 2022-08-28 MED ORDER — IOHEXOL 300 MG/ML  SOLN
100.0000 mL | Freq: Once | INTRAMUSCULAR | Status: AC | PRN
  Administered 2022-08-28: 100 mL via INTRAVENOUS

## 2022-08-28 NOTE — ED Provider Notes (Signed)
[facility] DEPT Provider Note   CSN: [id number] Arrival date & time: 08/27/22  2347     History  Chief Complaint  Patient presents with   Weakness    Mary Chase is a 87 y.o. female.  Patient is an 87 year old female with past medical history of asthma, hypertension osteoarthritis status post hip replacement.  Patient presented today for evaluation of weakness.  Patient describes generalized weakness starting yesterday.  This has worsened over the past 2 days.  She describes nausea and multiple episodes of vomiting.  She reports difficulty keeping food and medications down.  She denies to me she is having any fevers or chills.  She does describe generalized abdominal pain.  She has had some diarrhea.  All vomiting and diarrhea have been nonbloody.  The history is provided by the patient.       Home Medications Prior to Admission medications   Medication Sig Start Date End Date Taking? Authorizing Provider  albuterol (VENTOLIN HFA) 108 (90 Base) MCG/ACT inhaler Inhale 2 puffs into the lungs every 6 (six) hours as needed for wheezing. 05/22/22   Valentina Shaggy, MD  aspirin EC 81 MG tablet Take 1 tablet (81 mg total) by mouth 2 (two) times daily. 09/16/20   Leighton Parody, PA-C  Cholecalciferol (VITAMIN D3) 25 MCG (1000 UT) CAPS Take 1,000 Units by mouth daily in the afternoon. Patient not taking: Reported on 04/28/2022    [provider]  Cyanocobalamin (VITAMIN B-12) 5000 MCG TBDP Take 5,000 mcg by mouth daily.  Patient not taking: Reported on 04/28/2022    [provider]  diclofenac Sodium (VOLTAREN) 1 % GEL Apply 2 g topically daily as needed (Pain). Patient not taking: Reported on 04/28/2022    [provider]  fluticasone (FLONASE) 50 MCG/ACT nasal spray Place 2 sprays into both nostrils daily. Patient not taking: Reported on 06/11/2022 02/19/22   [provider]  fluticasone-salmeterol (WIXELA INHUB) 250-50  MCG/ACT AEPB Inhale 1 puff into the lungs in the morning and at bedtime. 04/14/22   Valentina Shaggy, MD  Fluticasone-Umeclidin-Vilant (TRELEGY ELLIPTA) 100-62.5-25 MCG/ACT AEPB Inhale 1 puff into the lungs daily. 06/11/22   Valentina Shaggy, MD  lisinopril (ZESTRIL) 40 MG tablet Take 40 mg by mouth daily. 09/11/19   [provider]  magnesium gluconate (MAGONATE) 500 MG tablet Take 500 mg by mouth daily. Patient not taking: Reported on 04/28/2022    [provider]  montelukast (SINGULAIR) 10 MG tablet Take 1 tablet (10 mg total) by mouth daily. 04/14/22   Valentina Shaggy, MD  Multiple Vitamins-Minerals (PRESERVISION AREDS 2 PO) Take 2 tablets by mouth daily in the afternoon. Patient not taking: Reported on 04/28/2022    [provider]  predniSONE (DELTASONE) 10 MG tablet Take two tablets ('20mg'$ ) twice daily for three days, then one tablet ('10mg'$ ) twice daily for three days, then STOP. 07/31/22   Valentina Shaggy, MD      Allergies    Penicillins, Keflex [cephalexin], Sulfa antibiotics, and Other    Review of Systems   Review of Systems  All other systems reviewed and are negative.   Physical Exam Updated Vital Signs BP (!) 191/120   Pulse (!) 106   Temp 99.1 F (37.3 C) (Oral)   Resp 18   Ht 5' (1.524 m)   Wt 74.8 kg   SpO2 95%   BMI 32.22 kg/m  Physical Exam Vitals and nursing note reviewed.  Constitutional:  General: She is not in acute distress.    Appearance: She is well-developed. She is not diaphoretic.  HENT:     Head: Normocephalic and atraumatic.  Cardiovascular:     Rate and Rhythm: Normal rate and regular rhythm.     Heart sounds: No murmur heard.    No friction rub. No gallop.  Pulmonary:     Effort: Pulmonary effort is normal. No respiratory distress.     Breath sounds: Normal breath sounds. No wheezing.  Abdominal:     General: Bowel sounds are normal. There is no distension.     Palpations: Abdomen is soft.  There is no mass.     Tenderness: There is abdominal tenderness. There is no right CVA tenderness, left CVA tenderness, guarding or rebound.     Hernia: No hernia is present.  Musculoskeletal:        General: Normal range of motion.     Cervical back: Normal range of motion and neck supple.  Skin:    General: Skin is warm and dry.  Neurological:     General: No focal deficit present.     Mental Status: She is alert and oriented to person, place, and time.     ED Results / Procedures / Treatments   Labs (all labs ordered are listed, but only abnormal results are displayed) Labs Reviewed  BASIC METABOLIC PANEL - Abnormal; Notable for the following components:      Result Value   Potassium 3.2 (*)    Glucose, Bld 149 (*)    All other components within normal limits  CBC - Abnormal; Notable for the following components:   WBC 13.0 (*)    Hemoglobin 15.1 (*)    All other components within normal limits  HEPATIC FUNCTION PANEL - Abnormal; Notable for the following components:   Total Protein 8.7 (*)    All other components within normal limits  MAGNESIUM - Abnormal; Notable for the following components:   Magnesium 1.4 (*)    All other components within normal limits  SARS CORONAVIRUS 2 BY RT PCR  CULTURE, BLOOD (ROUTINE X 2)  CULTURE, BLOOD (ROUTINE X 2)  LACTIC ACID, PLASMA  LACTIC ACID, PLASMA  TROPONIN I (HIGH SENSITIVITY)  TROPONIN I (HIGH SENSITIVITY)    EKG EKG Interpretation  Date/Time:  Thursday August 27 2022 23:57:50 EDT Ventricular Rate:  110 PR Interval:  156 QRS Duration: 82 QT Interval:  340 QTC Calculation: 460 R Axis:   26 Text Interpretation: Sinus tachycardia Left atrial enlargement Left ventricular hypertrophy No significant change since 09/10/2020 Confirmed by Veryl Speak [phone]) on 08/28/2022 2:38:28 AM  Radiology DG Chest 2 View  Result Date: 08/28/2022 CLINICAL DATA:  Chest pain. EXAM: CHEST - 2 VIEW COMPARISON:  Chest radiograph dated  04/30/2022. FINDINGS: No focal consolidation, pleural effusion, pneumothorax. The cardiac silhouette is within normal limits. Degenerative changes of the spine and shoulders. No acute osseous pathology. IMPRESSION: No active cardiopulmonary disease. Electronically Signed   By: Anner Crete M.D.   On: 08/28/2022 00:31    Procedures Procedures    Medications Ordered in ED Medications  sodium chloride 0.9 % bolus 1,000 mL (has no administration in time range)  ondansetron (ZOFRAN) injection 4 mg (has no administration in time range)  labetalol (NORMODYNE) injection 20 mg (has no administration in time range)    ED Course/ Medical Decision Making/ A&P  This patient presents to the ED for concern of generalized weakness and nausea, this involves an extensive number of  treatment options, and is a complaint that carries with it a high risk of complications and morbidity.  The differential diagnosis includes acute gastroenteritis, anemia, viral syndrome   Co morbidities that complicate the patient evaluation  None   Additional history obtained:  Additional history obtained from daughter at bedside No external records needed   Lab Tests:  I Ordered, and personally interpreted labs.  The pertinent results include: Unremarkable CBC, metabolic panel, troponin   Imaging Studies ordered:  I ordered imaging studies including CT scan of the abdomen and pelvis I independently visualized and interpreted imaging which showed small areas of enteritis, but no other acute finding. I agree with the radiologist interpretation   Cardiac Monitoring: / EKG:  The patient was maintained on a cardiac monitor.  I personally viewed and interpreted the cardiac monitored which showed an underlying rhythm of: Sinus rhythm   Consultations Obtained:  No lumbar urgent consultations needed   Problem List / ED Course / Critical interventions / Medication management  Patient presenting here with  nausea, abdominal cramping, generalized weakness, the etiology of which I suspect a viral cause.  Her laboratory studies are reassuring, but CT scan shows loops of bowel consistent with enteritis.  There is no evidence for obstruction or other surgical process.  Patient was given IV hydration along with medicine for nausea and is now feeling significantly improved.  He seems appropriate for discharge with outpatient follow-up. Work-up inconsistent with a cardiac etiology. I ordered medication including Zofran for nausea and labetalol for hypertension Reevaluation of the patient after these medicines showed that the patient improved I have reviewed the patients home medicines and have made adjustments as needed   Social Determinants of Health:  None   Test / Admission - Considered:  Patient to be discharged and Zofran and as needed return.  No emergent pathology identified that would necessitate admission  Final Clinical Impression(s) / ED Diagnoses Final diagnoses:  None    Rx / DC Orders ED Discharge Orders     None         Veryl Speak, MD 08/28/22 [phone]

## 2022-08-28 NOTE — Discharge Instructions (Signed)
Continue taking medications as previously prescribed.  Begin taking amlodipine as was previously prescribed.  Begin taking Zofran as prescribed as needed for nausea.  Return to the emergency department if you develop worsening pain, high fevers, bloody stools, or for other new and concerning symptoms.

## 2022-08-28 NOTE — ED Notes (Signed)
Patient transported to X-ray 

## 2022-09-02 DIAGNOSIS — K529 Noninfective gastroenteritis and colitis, unspecified: Secondary | ICD-10-CM | POA: Diagnosis not present

## 2022-09-02 LAB — CULTURE, BLOOD (ROUTINE X 2)
Culture: NO GROWTH
Culture: NO GROWTH
Special Requests: ADEQUATE
Special Requests: ADEQUATE

## 2022-09-10 ENCOUNTER — Ambulatory Visit: Payer: Medicare Other | Admitting: Allergy & Immunology

## 2022-09-10 ENCOUNTER — Encounter: Payer: Self-pay | Admitting: Allergy & Immunology

## 2022-09-10 VITALS — BP 130/70 | HR 99 | Temp 98.9°F | Resp 16

## 2022-09-10 DIAGNOSIS — R768 Other specified abnormal immunological findings in serum: Secondary | ICD-10-CM

## 2022-09-10 DIAGNOSIS — J4540 Moderate persistent asthma, uncomplicated: Secondary | ICD-10-CM | POA: Diagnosis not present

## 2022-09-10 DIAGNOSIS — J330 Polyp of nasal cavity: Secondary | ICD-10-CM | POA: Diagnosis not present

## 2022-09-10 DIAGNOSIS — J310 Chronic rhinitis: Secondary | ICD-10-CM | POA: Diagnosis not present

## 2022-09-10 MED ORDER — FLUTICASONE-SALMETEROL 250-50 MCG/ACT IN AEPB: 1.0000 | INHALATION_SPRAY | Freq: Two times a day (BID) | RESPIRATORY_TRACT | 5 refills | Status: DC

## 2022-09-10 MED ORDER — FLUTICASONE PROPIONATE 50 MCG/ACT NA SUSP: 1.0000 | Freq: Every day | NASAL | 5 refills | Status: DC | PRN

## 2022-09-10 MED ORDER — ALBUTEROL SULFATE HFA 108 (90 BASE) MCG/ACT IN AERS
2.0000 | INHALATION_SPRAY | Freq: Four times a day (QID) | RESPIRATORY_TRACT | 1 refills | Status: DC | PRN
Start: 2022-09-10 — End: 2023-04-30

## 2022-09-10 MED ORDER — MONTELUKAST SODIUM 10 MG PO TABS
10.0000 mg | ORAL_TABLET | Freq: Every day | ORAL | 5 refills | Status: DC
Start: 2022-09-10 — End: 2024-09-06

## 2022-09-10 NOTE — Patient Instructions (Addendum)
1. Moderate persistent asthma, uncomplicated - Lung testing looked better today which is GREAT!  - We will just continue with the Wixela since you seem to be doing well with that.  - Daily controller medication(s): Wixela 250/50 mcg one puff TWICE DAILY + Singulair (montelukast) '10mg'$  daily - Prior to physical activity: albuterol 2 puffs 10-15 minutes before physical activity. - Rescue medications: albuterol 4 puffs every 4-6 hours as needed - Asthma control goals:  * Full participation in all desired activities (may need albuterol before activity) * Albuterol use two time or less a week on average (not counting use with activity) * Cough interfering with sleep two time or less a month * Oral steroids no more than once a year * No hospitalizations  2. 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 - We will send in refills for these medications.   3. Elevated IgE  - Previous work up has been normal.  - I guess I feel better since Heme/Onc team did not want to see you.  - We are going to get another level and see where the IgE level is trending.   4. Return in about 3 months (around 12/10/2022).    Please inform us of any Emergency Department visits, hospitalizations, or changes in symptoms. Call us before going to the ED for breathing or allergy symptoms since we might be able to fit you in for a sick visit. Feel free to contact us anytime with any questions, problems, or concerns.  It was a pleasure to see you again today!  Websites that have reliable patient information: 1. American Academy of Asthma, Allergy, and Immunology: www.aaaai.org 2. Food Allergy Research and Education (FARE): foodallergy.org 3. Mothers of Asthmatics: http://www.asthmacommunitynetwork.org 4. American College of Allergy, Asthma, and Immunology: www.acaai.org   COVID-19 Vaccine Information can be found at:  [email or web address].uk For questions related to vaccine distribution or appointments, please email [email or web address] or call [phone].   We realize that you might be concerned about having an allergic reaction to the COVID19 vaccines. To help with that concern, WE ARE OFFERING THE COVID19 VACCINES IN OUR OFFICE! Ask the front desk for dates!     "Like" [place] on Facebook and Instagram for our latest updates!      A healthy democracy works best when [employer] participate! Make sure you are registered to vote! If you have moved or changed any of your contact information, you will need to get this updated before voting!  In some cases, you MAY be able to register to vote online: [email or web address]

## 2022-09-10 NOTE — Progress Notes (Signed)
FOLLOW UP  Date of Service/Encounter:  09/10/22   Assessment:   Moderate persistent asthma, uncomplicated - looking into start Dupixent   Chronic rhinitis (grasses, weeds, ragweed, trees, molds, dust mites, roach, cat, dog) - s/p years of allergen immunotherapy   Elevated IgE - with normal SPEP and UPEP (rechecking IgE level today per patient request)   Polyp of left nasal cavity - much better with the regular use of fluticasone nasal spray  Plan/Recommendations:   1. Moderate persistent asthma, uncomplicated - Lung testing looked better today which is GREAT!  - We will just continue with the Wixela since you seem to be doing well with that.  - Daily controller medication(s): Wixela 250/50 mcg one puff TWICE DAILY + Singulair (montelukast) '10mg'$  daily - Prior to physical activity: albuterol 2 puffs 10-15 minutes before physical activity. - Rescue medications: albuterol 4 puffs every 4-6 hours as needed - Asthma control goals:  * Full participation in all desired activities (may need albuterol before activity) * Albuterol use two time or less a week on average (not counting use with activity) * Cough interfering with sleep two time or less a month * Oral steroids no more than once a year * No hospitalizations  2. 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 - We will send in refills for these medications.   3. Elevated IgE  - Previous work up has been normal.  - I guess I feel better since Heme/Onc team did not want to see you.  - We are going to get another level and see where the IgE level is trending.   4. Return in about 3 months (around 12/10/2022).    Subjective:   Mary Chase is a 87 y.o. female presenting today for follow up of  Chief Complaint  Patient presents with   Follow-up    Asthma is doing ok. Not having attacks but SOB.     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  Mary Chase is a 87 y.o. female presenting for a follow up visit.  She was last seen in June 2023.  At that time, we did not do lung testing.  We gave her another sample of Trelegy to use in place of [place].  We continue with 100 mcg 1 puff once daily and Singulair 10 mg daily.  For her rhinitis with nasal polyposis, we continue with Flonase 1 spray per nostril.  We also discussed starting Dupixent, which she was hesitant to do.  Her previous work-up for elevated IgE has been normal.  We tried to refer her to hematology/oncology, but they declined the referral stating that we will we have already done the work-up that they would do.  For her right middle lobe opacity, we ordered a chest CT. However, this was never done.   Since the last visit, she has done well.   Asthma/Respiratory Symptom History: She thinks that she is taking the Wixela rather than the Trelegy. She has been short winded with some physical activity. She sleeps fine without coughing at night.   She has been using her rescue inhaler infrequently. Overall she feels that she is feeling better than she was  doing previously.   Allergic Rhinitis Symptom History: She has been using the Flonase and this has helped with her PND. She was on allergy shots for 27 years with Dr. Shaune Leeks. She feels that this worked. She has not felt the need to do this again since her symptoms have not really returned.   She was in the ED for stomach issues. An abdominal CT was unremarkable.   Otherwise, there have been no changes to her past medical history, surgical history, family history, or social history.    Review of Systems  Constitutional: Negative.  Negative for chills, fever, malaise/fatigue and  weight loss.  HENT: Negative.  Negative for congestion, ear discharge and ear pain.   Eyes:  Negative for pain, discharge and redness.  Respiratory:  Negative for cough, sputum production, shortness of breath and wheezing.   Cardiovascular: Negative.  Negative for chest pain and palpitations.  Gastrointestinal:  Negative for abdominal pain, constipation, diarrhea, heartburn, nausea and vomiting.  Skin: Negative.  Negative for itching and rash.  Neurological:  Negative for dizziness and headaches.  Endo/Heme/Allergies:  Negative for environmental allergies. Does not bruise/bleed easily.       Objective:   Blood pressure 130/70, pulse 99, temperature 98.9 F (37.2 C), temperature source Temporal, resp. rate 16, SpO2 97 %. There is no height or weight on file to calculate BMI.    Physical Exam Constitutional:      Appearance: She is well-developed.     Comments: Very pleasant female.  Extremely talkative.  HENT:     Head: Normocephalic and atraumatic.     Right Ear: Tympanic membrane, ear canal and external ear normal. No drainage, swelling or tenderness. Tympanic membrane is not injected, scarred, erythematous, retracted or bulging.     Left Ear: Tympanic membrane, ear canal and external ear normal. No drainage, swelling or tenderness. Tympanic membrane is not injected, scarred, erythematous, retracted or bulging.     Nose: Nasal deformity present. No septal deviation, mucosal edema or rhinorrhea.     Right Turbinates: Enlarged. Not swollen or pale.     Left Turbinates: Enlarged. Not swollen or pale.     Right Sinus: No maxillary sinus tenderness or frontal sinus tenderness.     Left Sinus: No maxillary sinus tenderness or frontal sinus tenderness.     Comments: Polyp on the left much smaller, obstructing approximately 50% of the nasal cavity.     Mouth/Throat:     Mouth: Mucous membranes are not pale and not dry.     Pharynx: Uvula midline.  Eyes:     General:        Right eye:  No discharge.        Left eye: No discharge.     Conjunctiva/sclera: Conjunctivae normal.     Right eye: Right conjunctiva is not injected. No chemosis.    Left eye: Left conjunctiva is not injected. No chemosis.    Pupils: Pupils are equal, round, and reactive to light.  Cardiovascular:     Rate and Rhythm: Normal rate and regular rhythm.     Heart sounds: Normal heart sounds.  Pulmonary:     Effort: Pulmonary effort is normal. No tachypnea, accessory muscle usage or respiratory distress.     Breath sounds: Decreased air movement present. No wheezing, rhonchi or rales.     Comments: Decreased air movement at the bases. Chest:     Chest wall: No tenderness.  Lymphadenopathy:     Head:     Right side  of head: No submandibular, tonsillar or occipital adenopathy.     Left side of head: No submandibular, tonsillar or occipital adenopathy.     Cervical: No cervical adenopathy.  Skin:    Coloration: Skin is not pale.     Findings: No abrasion, erythema, petechiae or rash. Rash is not papular, urticarial or vesicular.  Neurological:     Mental Status: She is alert.  Psychiatric:        Behavior: Behavior is cooperative.      Diagnostic studies:    Spirometry: results abnormal (FEV1: 0.68/48%, FVC: 1.39/72%, FEV1/FVC: 49%).    Spirometry consistent with mixed obstructive and restrictive disease.  Overall, this is much better than her previous spirometric findings.   Allergy Studies: none        Salvatore Marvel, MD  [facility]

## 2022-09-14 LAB — IGE: IgE (Immunoglobulin E), Serum: 17774 IU/mL — ABNORMAL HIGH (ref 6–495)

## 2022-10-12 DIAGNOSIS — J338 Other polyp of sinus: Secondary | ICD-10-CM | POA: Diagnosis not present

## 2022-10-12 DIAGNOSIS — J310 Chronic rhinitis: Secondary | ICD-10-CM | POA: Diagnosis not present

## 2022-10-12 DIAGNOSIS — J343 Hypertrophy of nasal turbinates: Secondary | ICD-10-CM | POA: Diagnosis not present

## 2022-10-12 DIAGNOSIS — J342 Deviated nasal septum: Secondary | ICD-10-CM | POA: Diagnosis not present

## 2022-11-02 DIAGNOSIS — J45909 Unspecified asthma, uncomplicated: Secondary | ICD-10-CM | POA: Diagnosis not present

## 2022-11-02 DIAGNOSIS — E7800 Pure hypercholesterolemia, unspecified: Secondary | ICD-10-CM | POA: Diagnosis not present

## 2022-11-02 DIAGNOSIS — M1990 Unspecified osteoarthritis, unspecified site: Secondary | ICD-10-CM | POA: Diagnosis not present

## 2022-11-02 DIAGNOSIS — I10 Essential (primary) hypertension: Secondary | ICD-10-CM | POA: Diagnosis not present

## 2022-11-02 DIAGNOSIS — M810 Age-related osteoporosis without current pathological fracture: Secondary | ICD-10-CM | POA: Diagnosis not present

## 2022-11-02 DIAGNOSIS — I739 Peripheral vascular disease, unspecified: Secondary | ICD-10-CM | POA: Diagnosis not present

## 2022-11-02 DIAGNOSIS — N1831 Chronic kidney disease, stage 3a: Secondary | ICD-10-CM | POA: Diagnosis not present

## 2022-11-03 ENCOUNTER — Other Ambulatory Visit (HOSPITAL_BASED_OUTPATIENT_CLINIC_OR_DEPARTMENT_OTHER): Payer: Self-pay | Admitting: Family Medicine

## 2022-11-03 DIAGNOSIS — M810 Age-related osteoporosis without current pathological fracture: Secondary | ICD-10-CM

## 2022-11-11 ENCOUNTER — Other Ambulatory Visit (HOSPITAL_BASED_OUTPATIENT_CLINIC_OR_DEPARTMENT_OTHER): Payer: Medicare Other

## 2022-11-18 ENCOUNTER — Other Ambulatory Visit: Payer: Self-pay

## 2022-11-18 ENCOUNTER — Emergency Department (HOSPITAL_BASED_OUTPATIENT_CLINIC_OR_DEPARTMENT_OTHER): Payer: Medicare Other

## 2022-11-18 ENCOUNTER — Emergency Department (HOSPITAL_BASED_OUTPATIENT_CLINIC_OR_DEPARTMENT_OTHER)
Admission: EM | Admit: 2022-11-18 | Discharge: 2022-11-18 | Discharge status: Against Medical Advice | Payer: Medicare Other | Attending: Emergency Medicine | Admitting: Emergency Medicine

## 2022-11-18 ENCOUNTER — Ambulatory Visit (HOSPITAL_BASED_OUTPATIENT_CLINIC_OR_DEPARTMENT_OTHER)
Admission: RE | Admit: 2022-11-18 | Discharge: 2022-11-18 | Disposition: A | Discharge status: Home / Self Care | Payer: Medicare Other | Source: Ambulatory Visit | Attending: Family Medicine | Admitting: Family Medicine

## 2022-11-18 ENCOUNTER — Emergency Department (HOSPITAL_BASED_OUTPATIENT_CLINIC_OR_DEPARTMENT_OTHER): Payer: Medicare Other | Admitting: Radiology

## 2022-11-18 ENCOUNTER — Encounter (HOSPITAL_BASED_OUTPATIENT_CLINIC_OR_DEPARTMENT_OTHER): Payer: Self-pay

## 2022-11-18 DIAGNOSIS — Z7982 Long term (current) use of aspirin: Secondary | ICD-10-CM | POA: Diagnosis not present

## 2022-11-18 DIAGNOSIS — M25511 Pain in right shoulder: Secondary | ICD-10-CM | POA: Insufficient documentation

## 2022-11-18 DIAGNOSIS — M810 Age-related osteoporosis without current pathological fracture: Secondary | ICD-10-CM | POA: Diagnosis not present

## 2022-11-18 DIAGNOSIS — M25512 Pain in left shoulder: Secondary | ICD-10-CM | POA: Insufficient documentation

## 2022-11-18 MED ORDER — LIDOCAINE-EPINEPHRINE 2 %-1:100000 IJ SOLN: 20.0000 mL | Freq: Once | INTRAMUSCULAR | Status: DC

## 2022-11-18 MED ORDER — LIDOCAINE-EPINEPHRINE (PF) 2 %-1:200000 IJ SOLN
INTRAMUSCULAR | Status: AC
  Administered 2022-11-18: 20 mL
  Filled 2022-11-18: qty 20

## 2022-11-18 NOTE — Discharge Instructions (Signed)
Contact a health care provider if: Your pain comes back, and it is worse than before the injection. You may need more injections. You have chills or a fever. The injection site becomes more painful, red, swollen, or warm to the touch.

## 2022-11-18 NOTE — ED Notes (Signed)
The Lido/Epi that was overridden was the med that was needed by the Provider.

## 2022-11-18 NOTE — ED Provider Notes (Signed)
[facility] EMERGENC[facility] DEPT Provider Note   CSN: [id number] Arrival date & time: 11/18/22  1454     History  Chief Complaint  Patient presents with   Shoulder Pain    Mary Chase is a 87 y.o. female who presents urgency department with chief complaint of scapular pain.  Patient states she has pain in the left shoulder blade region between the spine and the medial border of the scapula.  It is tender and radiates into the shoulder.  She describes the pain as deep and achy.  She took some over-the-counter pain reliever without any relief of symptoms.  Is worse when she lays on it or moves her shoulder blade.  Nothing seems to make it any better.  She states that her children told her they thought it was "a muscle."  She states that she did a lot of extra cooking over the holidays.  She did   Shoulder Pain      Home Medications Prior to Admission medications   Medication Sig Start Date End Date Taking? Authorizing Provider  albuterol (VENTOLIN HFA) 108 (90 Base) MCG/ACT inhaler Inhale 2 puffs into the lungs every 6 (six) hours as needed for wheezing. 09/10/22   Valentina Shaggy, MD  aspirin EC 81 MG tablet Take 1 tablet (81 mg total) by mouth 2 (two) times daily. 09/16/20   Leighton Parody, PA-C  Cholecalciferol (VITAMIN D3) 25 MCG (1000 UT) CAPS Take 1,000 Units by mouth daily in the afternoon. Patient not taking: Reported on 09/10/2022    [provider]  Cyanocobalamin (VITAMIN B-12) 5000 MCG TBDP Take 5,000 mcg by mouth daily.    [provider]  diclofenac Sodium (VOLTAREN) 1 % GEL Apply 2 g topically daily as needed (Pain).    [provider]  fluticasone (FLONASE) 50 MCG/ACT nasal spray Place 1 spray into both nostrils daily as needed for allergies or rhinitis. 09/10/22   Valentina Shaggy, MD  fluticasone-salmeterol [facility]) 250-50 MCG/ACT AEPB Inhale 1 puff into the lungs in the morning and at bedtime. 09/10/22    Valentina Shaggy, MD  lisinopril (ZESTRIL) 40 MG tablet Take 40 mg by mouth daily. 09/11/19   [provider]  magnesium gluconate (MAGONATE) 500 MG tablet Take 500 mg by mouth daily.    [provider]  montelukast (SINGULAIR) 10 MG tablet Take 1 tablet (10 mg total) by mouth daily. 09/10/22   Valentina Shaggy, MD  Multiple Vitamins-Minerals (PRESERVISION AREDS 2 PO) Take 2 tablets by mouth daily in the afternoon.    [provider]  ondansetron (ZOFRAN-ODT) 8 MG disintegrating tablet '8mg'$  ODT q4 hours prn nausea 08/28/22   Veryl Speak, MD  predniSONE (DELTASONE) 10 MG tablet Take two tablets ('20mg'$ ) twice daily for three days, then one tablet ('10mg'$ ) twice daily for three days, then STOP. 07/31/22   Valentina Shaggy, MD      Allergies    Penicillins, Keflex [cephalexin], Sulfa antibiotics, and Other    Review of Systems   Review of Systems  Physical Exam Updated Vital Signs BP (!) 170/102   Pulse (!) 103   Temp 98.2 F (36.8 C) (Oral)   Resp 17   Ht 5' (1.524 m)   Wt 74.8 kg   SpO2 100%   BMI 32.21 kg/m  Physical Exam Vitals and nursing note reviewed.  Constitutional:      General: She is not in acute distress.    Appearance: She is well-developed. She is  not diaphoretic.  HENT:     Head: Normocephalic and atraumatic.     Right Ear: External ear normal.     Left Ear: External ear normal.     Nose: Nose normal.     Mouth/Throat:     Mouth: Mucous membranes are moist.  Eyes:     General: No scleral icterus.    Conjunctiva/sclera: Conjunctivae normal.  Cardiovascular:     Rate and Rhythm: Normal rate and regular rhythm.     Heart sounds: Normal heart sounds. No murmur heard.    No friction rub. No gallop.  Pulmonary:     Effort: Pulmonary effort is normal. No respiratory distress.     Breath sounds: Normal breath sounds.  Abdominal:     General: Bowel sounds are normal. There is no distension.     Palpations: Abdomen is soft. There  is no mass.     Tenderness: There is no abdominal tenderness. There is no guarding.  Musculoskeletal:     Cervical back: Normal range of motion.       Back:     Comments: Tender and contracted hyper irritable focus of muscle tissue between the medial border of the scapula and the spine.  This is spastic, tender to palpation and causes referred pain consistent with trigger point  Skin:    General: Skin is warm and dry.  Neurological:     Mental Status: She is alert and oriented to person, place, and time.  Psychiatric:        Behavior: Behavior normal.     ED Results / Procedures / Treatments   Labs (all labs ordered are listed, but only abnormal results are displayed) Labs Reviewed  RESP PANEL BY RT-PCR (RSV, FLU A&B, COVID)  [id number]  TROPONIN I (HIGH SENSITIVITY)  TROPONIN I (HIGH SENSITIVITY)    EKG EKG Interpretation  Date/Time:  Wednesday November 18 2022 15:16:47 EST Ventricular Rate:  103 PR Interval:  152 QRS Duration: 78 QT Interval:  340 QTC Calculation: 445 R Axis:   55 Text Interpretation: Sinus tachycardia Otherwise normal ECG When compared with ECG of 27-Aug-2022 23:57, PREVIOUS ECG IS PRESENT Confirmed by Georgina Snell [phone]) on 11/18/2022 6:47:57 PM  Radiology DG Shoulder Right  Result Date: 11/18/2022 CLINICAL DATA:  Shoulder pain for 3 days. EXAM: RIGHT SHOULDER - 2+ VIEW COMPARISON:  None Available. FINDINGS: Significant/advanced glenohumeral joint degenerative changes with marked joint space narrowing, osteophytic spurring and subchondral cystic change. Findings are somewhat progressive when compared to the prior chest x-ray from 09/10/2020. Mild AC joint degenerative changes. No acute bony findings. IMPRESSION: Significant/advanced glenohumeral joint degenerative changes. No acute bony findings. Electronically Signed   By: Marijo Sanes M.D.   On: 11/18/2022 15:45   DG BONE DENSITY (DXA)  Result Date: 11/18/2022 EXAM: DUAL X-RAY ABSORPTIOMETRY  (DXA) FOR BONE MINERAL DENSITY IMPRESSION: Referring Physician:  Orpah Melter Your patient completed a bone mineral density test using GE Lunar iDXA system (analysis version: 16). Technologist: ALW PATIENT: Name: Bianey, Tesoro Patient ID: [id number] Birth Date: 05-May-1935 Height: 60.0 in. Sex: Female Measured: 11/18/2022 Weight: 161.0 lbs. Indications: Advanced Age, Estrogen Deficiency, History of Fracture (Adult), Post Menopausal, Right Hip Replacement Fractures: Ankle, Elbow Treatments: Calcium, Vitamin D ASSESSMENT: The BMD measured at Left Femur Total is 0.539 g/cm2 with a T-score of -3.7. This patient is considered osteoporotic according to [facility]) criteria. The scan quality is good. Lumbar Spine excluded due to degenerative changes. Site Region Measured Date Measured Age 33  BMD Significant CHANGE T-score Left Femur Total 11/18/2022 84.9 -3.7 0.539 g/cm2 Left Femur Neck 11/18/2022 84.9 -2.5 0.684 g/cm2 Left Forearm Radius 33% 11/18/2022 84.9 -2.8 0.628 g/cm2 World Health Organization [facility]) criteria for post-menopausal, Caucasian Women: Normal       T-score at or above -1 SD Osteopenia   T-score between -1 and -2.5 SD Osteoporosis T-score at or below -2.5 SD RECOMMENDATION: 1. All patients should optimize calcium and vitamin D intake. 2. Consider FDA approved medical therapies in postmenopausal women and men aged 90 years and older, based on the following: a. A hip or vertebral (clinical or morphometric) fracture b. T-score = -2.5 at the femoral neck or spine after appropriate evaluation to exclude secondary causes c. Low bone mass (T-score between -1.0 and -2.5 at the femoral neck or spine) and a 10- year probability of a hip fracture = 3% or a 10 year probability of a major osteoporosis-related fracture = 20% based on the [place]-adapted WHO algorithm. 3. Clinician judgement and/or patient preference may indicate treatment for people with10-year fracture probabilities above or below  these levels. FOLLOW-UP: Patients with diagnosis of osteoporosis or at high risk for fracture should have regular bone mineral density tests. For patients eligible for Medicare routine testing is allowed once every 2 years. The testing frequency can be increased to one year for patients who have rapidly progressing disease, those who are receiving or discontinuing medical therapy to restore bone mass, or have additional risk factors. I have reviewed this study and agree with the findings. [facility] Electronically Signed   By: Franki Cabot M.D.   On: 11/18/2022 15:07    Procedures Procedures    TRIGGER POINT INJECTION PROCEDURE NOTE Procedure authorized and performed by: Ned Grace  Risks and benefits including pneumothorax, bleeding, infection, incomplete pain relief allergic reactions, increased HR and cardiac events discussed at bedside . Patient consents  Patient given 2% lidocaine w ep 1:100000 in 1 trigger points.  Procedure: With chlorhexidine prep of the R scapluar region musculature.  Palpable firm area of tenderness consistent with trigger point is localized. It was approached with 25-gauge  1 " needle and 4 mL of anesthetic injected. Tolerated this well with improved pain relief.    Medications Ordered in ED Medications  lidocaine-EPINEPHrine (XYLOCAINE W/EPI) 2 %-1:200000 (PF) injection (20 mLs  Given 11/18/22 2048)    ED Course/ Medical Decision Making/ A&P                           Medical Decision Making Amount and/or Complexity of Data Reviewed Radiology: ordered.  Risk Prescription drug management.   Patient seen by myse;lf and Dr. Nechama Guard. I Suspect MSK pain and trigger point. Patient did have improved pain relief.  After assessments by Dr. Nechama Guard, she wished to broaden her assesment to include ACS, PE.  Patietn HR did go up after injection, but lung sounds were normal and I suspect this was due to the epi in the lidocaine. Patient  declined further work up .    Guadelupe Sabin Boza or her authorized caregiver has made the decision for the patient to leave the emergency department against the advice of Ned Grace   She or her authorized caregiver has been informed and understands the inherent risks, including death.  She or her authorized caregiver has decided to accept the responsibility for this decision. Guadelupe Sabin Bally and all necessary parties have been advised that she may return for further  evaluation or treatment. Her condition at time of discharge was Stable.  Guadelupe Sabin [place] had current vital signs as follows:  Blood pressure (!) 170/102, pulse (!) 103, temperature 98.2 F (36.8 C), temperature source Oral, resp. rate 17, height 5' (1.524 m), weight 74.8 kg, SpO2 100 %.   Guadelupe Sabin Verhoeven or her authorized caregiver has signed the Leaving Against Medical Advice form prior to leaving the department.  Margarita Mail 11/20/2022          Final Clinical Impression(s) / ED Diagnoses Final diagnoses:  Trigger point of shoulder region, right    Rx / DC Orders ED Discharge Orders     None         Margarita Mail, PA-C 11/20/22 1446    Elgie Congo, MD 11/22/22 [phone]

## 2022-11-18 NOTE — ED Notes (Signed)
Pt states that she does not want to have any labs or xrays done. Provider will be made aware

## 2022-11-18 NOTE — ED Triage Notes (Signed)
Patient here POV from Home.  Endorses Right Shoulder Pain that began 3 Days ago. States she believes she overused it while cooking for Thanksgiving.  NAD Noted during Triage. A&Ox4. GCS 15. Ambulatory.

## 2022-11-26 ENCOUNTER — Encounter: Payer: Self-pay | Admitting: Allergy & Immunology

## 2022-11-26 ENCOUNTER — Ambulatory Visit (INDEPENDENT_AMBULATORY_CARE_PROVIDER_SITE_OTHER): Payer: Medicare Other | Admitting: Allergy & Immunology

## 2022-11-26 VITALS — BP 124/78 | HR 83 | Resp 16 | Ht 60.0 in | Wt 161.0 lb

## 2022-11-26 DIAGNOSIS — J330 Polyp of nasal cavity: Secondary | ICD-10-CM

## 2022-11-26 DIAGNOSIS — J4540 Moderate persistent asthma, uncomplicated: Secondary | ICD-10-CM

## 2022-11-26 DIAGNOSIS — R768 Other specified abnormal immunological findings in serum: Secondary | ICD-10-CM | POA: Diagnosis not present

## 2022-11-26 DIAGNOSIS — J310 Chronic rhinitis: Secondary | ICD-10-CM

## 2022-11-26 NOTE — Patient Instructions (Addendum)
1. Moderate persistent asthma, uncomplicated - Lung testing looked a little worse.  - But since you are feeling better, let's just not do anything.  - Think about stopping the montelukast to see if this makes a difference at all.  - Daily controller medication(s): Wixela 250/50 mcg one puff TWICE DAILY + Singulair (montelukast) '10mg'$  daily - Prior to physical activity: albuterol 2 puffs 10-15 minutes before physical activity. - Rescue medications: albuterol 4 puffs every 4-6 hours as needed - Asthma control goals:  * Full participation in all desired activities (may need albuterol before activity) * Albuterol use two time or less a week on average (not counting use with activity) * Cough interfering with sleep two time or less a month * Oral steroids no more than once a year * No hospitalizations  2. 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 - We will send in refills for these medications.   3. Elevated IgE  - Previous work up has been normal.  - We are not going to check the level today since it would not change our management. - Heme/Onc did not think you needed to see them at all, so that is a good thing   4. Return in about 6 months (around 05/28/2023).    Please inform us of any Emergency Department visits, hospitalizations, or changes in symptoms. Call us before going to the ED for breathing or allergy symptoms since we might be able to fit you in for a sick visit. Feel free to contact us anytime with any questions, problems, or concerns.  It was a pleasure to see you again today!  Websites that have reliable patient information: 1. American Academy of Asthma, Allergy, and Immunology: www.aaaai.org 2. Food Allergy Research and Education (FARE): foodallergy.org 3. Mothers of Asthmatics: http://www.asthmacommunitynetwork.org 4. American College of Allergy, Asthma, and Immunology: www.acaai.org   COVID-19 Vaccine Information can  be found at: ShippingScam.co.uk For questions related to vaccine distribution or appointments, please email [email or web address] or call [phone].   We realize that you might be concerned about having an allergic reaction to the COVID19 vaccines. To help with that concern, WE ARE OFFERING THE COVID19 VACCINES IN OUR OFFICE! Ask the front desk for dates!     "Like" [place] on Facebook and Instagram for our latest updates!      A healthy democracy works best when [employer] participate! Make sure you are registered to vote! If you have moved or changed any of your contact information, you will need to get this updated before voting!  In some cases, you MAY be able to register to vote online: [email or web address]

## 2022-11-26 NOTE — Progress Notes (Signed)
FOLLOW UP  Date of Service/Encounter:  11/26/22   Assessment:   Moderate persistent asthma, uncomplicated - looking into start Dupixent   Chronic rhinitis (grasses, weeds, ragweed, trees, molds, dust mites, roach, cat, dog) - s/p years of allergen immunotherapy   Elevated IgE - with normal SPEP and UPEP (rechecking IgE level today per patient request)   Polyp of left nasal cavity - much better with the regular use of fluticasone nasal spray  Plan/Recommendations:   1. Moderate persistent asthma, uncomplicated - Lung testing looked a little worse.  - But since you are feeling better, let's just not do anything.  - Think about stopping the montelukast to see if this makes a difference at all.  - Daily controller medication(s): Wixela 250/50 mcg one puff TWICE DAILY + Singulair (montelukast) '10mg'$  daily - Prior to physical activity: albuterol 2 puffs 10-15 minutes before physical activity. - Rescue medications: albuterol 4 puffs every 4-6 hours as needed - Asthma control goals:  * Full participation in all desired activities (may need albuterol before activity) * Albuterol use two time or less a week on average (not counting use with activity) * Cough interfering with sleep two time or less a month * Oral steroids no more than once a year * No hospitalizations  2. 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 - We will send in refills for these medications.   3. Elevated IgE  - Previous work up has been normal.  - We are not going to check the level today since it would not change our management. - Heme/Onc did not think you needed to see them at all, so that is a good thing   4. Return in about 6 months (around 05/28/2023).   Subjective:   Mary Chase is a 87 y.o. 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  Mary Chase is a 87 y.o. female presenting for a follow up visit. She was last seen in September 2023. At that time, her lung testing looked better. We continued with Wixela '250mg'$  one puff BID as well as montelukast. For her elevated IgE, the Heme/Onc Team declined the referral saying that I had done everything that they would have done anyway. IgE level was trending down (max 48,710 and then down to 17,774).   Since the last visit, she has done well. She reports that she had been getting more weak lately. She has a very cool walker that converts into a chair. She has been tweaking her blood pressure medications.   Asthma/Respiratory Symptom History: She is on the [place]. She estimates that she misses once dose once per week. This is approximate. She tries to not miss doses if she can help. She has not needed to miss her emergency inhaler more. Over the last few months, breathing has been more labored without a distinct asthma attack. She has had two EKGs and her heart has been fine.  She is trying to treat this with increased.   She has been on Trelegy but she did not feel that this was as  affective as the other medications. She preferred to go back to the [place].   She is taking vitamin D and B12 supplements. She prefers to try more vitamins.   Allergic Rhinitis Symptom History: She generally feels  that she is breathing well through her nose. She has the Flonase which is fairly good about using. She is not motivated to do anything more aggressive with regards to her nasal polyps. We discussed her elevated IgE again today. It was trending downwards the last time that we checked it. She would like to check it again today to make sure it is still  going to same way.   Otherwise, there have been no changes to her past medical history, surgical history, family history, or social history.    Review of Systems  Constitutional: Negative.  Negative for chills, fever, malaise/fatigue and weight loss.  HENT: Negative.  Negative for congestion, ear discharge and ear pain.   Eyes:  Negative for pain, discharge and redness.  Respiratory:  Negative for cough, sputum production, shortness of breath and wheezing.   Cardiovascular: Negative.  Negative for chest pain and palpitations.  Gastrointestinal:  Negative for abdominal pain, constipation, diarrhea, heartburn, nausea and vomiting.  Skin: Negative.  Negative for itching and rash.  Neurological:  Negative for dizziness and headaches.  Endo/Heme/Allergies:  Positive for environmental allergies. Does not bruise/bleed easily.       Objective:   Blood pressure 124/78, pulse 83, resp. rate 16, height 5' (1.524 m), weight 161 lb (73 kg), SpO2 97 %. Body mass index is 31.44 kg/m.    Physical Exam Vitals reviewed.  Constitutional:      Appearance: She is well-developed.     Comments: Very pleasant female.  Extremely talkative.  HENT:     Head: Normocephalic and atraumatic.     Right Ear: Tympanic membrane, ear canal and external ear normal. No drainage, swelling or tenderness. Tympanic membrane is not injected, scarred, erythematous, retracted or bulging.     Left Ear: Tympanic membrane, ear canal and external ear normal. No drainage, swelling or tenderness. Tympanic membrane is not injected, scarred, erythematous, retracted or bulging.     Nose: Nasal deformity present. No septal deviation, mucosal edema or rhinorrhea.     Right Turbinates: Enlarged, swollen and pale.     Left Turbinates: Enlarged, swollen and pale.     Right Sinus: No maxillary sinus tenderness or frontal sinus tenderness.     Left Sinus: No maxillary sinus tenderness or frontal sinus tenderness.     Comments: Polyp  on the left much smaller, obstructing approximately 25% of the nasal cavity.     Mouth/Throat:     Mouth: Mucous membranes are not pale and not dry.     Pharynx: Uvula midline.  Eyes:     General:        Right eye: No discharge.        Left eye: No discharge.     Conjunctiva/sclera: Conjunctivae normal.     Right eye: Right conjunctiva is not injected. No chemosis.    Left eye: Left conjunctiva is not injected. No chemosis.    Pupils: Pupils are equal, round, and reactive to light.  Cardiovascular:     Rate and Rhythm: Normal rate and regular rhythm.     Heart sounds: Normal heart sounds.  Pulmonary:     Effort: Pulmonary effort is normal. No tachypnea, accessory muscle usage or respiratory distress.     Breath sounds: Decreased air movement present. No wheezing,  rhonchi or rales.     Comments: Decreased air movement at the bases. Chest:     Chest wall: No tenderness.  Musculoskeletal:     Cervical back: Rigidity present.  Lymphadenopathy:     Head:     Right side of head: No submandibular, tonsillar or occipital adenopathy.     Left side of head: No submandibular, tonsillar or occipital adenopathy.     Cervical: No cervical adenopathy.  Skin:    Coloration: Skin is not pale.     Findings: No abrasion, erythema, petechiae or rash. Rash is not papular, urticarial or vesicular.  Neurological:     Mental Status: She is alert.  Psychiatric:        Behavior: Behavior is cooperative.      Diagnostic studies:    Spirometry: results abnormal (FEV1: 0.57/42%, FVC: 1.05/59%, FEV1/FVC: 54%).    Spirometry consistent with mixed obstructive and restrictive disease. This is fairly stable compared to other spirometric findings.   Allergy Studies: none        Salvatore Marvel, MD  [facility] [place] [facility]

## 2022-12-18 ENCOUNTER — Ambulatory Visit (INDEPENDENT_AMBULATORY_CARE_PROVIDER_SITE_OTHER): Payer: Medicare Other | Admitting: Internal Medicine

## 2022-12-18 DIAGNOSIS — J069 Acute upper respiratory infection, unspecified: Secondary | ICD-10-CM

## 2022-12-18 DIAGNOSIS — J4540 Moderate persistent asthma, uncomplicated: Secondary | ICD-10-CM

## 2022-12-18 DIAGNOSIS — J310 Chronic rhinitis: Secondary | ICD-10-CM

## 2022-12-18 MED ORDER — PREDNISONE 10 MG PO TABS: ORAL_TABLET | ORAL | 0 refills | Status: AC

## 2022-12-18 NOTE — Progress Notes (Signed)
RE: Mary Chase MRN: [id number] DOB: 08/17/1935 Date of Telemedicine Visit: 12/18/2022  Referring provider: Orpah Melter, MD Primary care provider: Orpah Melter, MD  Chief Complaint: No chief complaint on file.   Telemedicine Follow Up Visit via Telephone: I connected with Mary Chase for a follow up on 12/18/22 by telephone and verified that I am speaking with the correct person using two identifiers.   I discussed the limitations, risks, security and privacy concerns of performing an evaluation and management service by telephone and the availability of in person appointments. I also discussed with the patient that there may be a patient responsible charge related to this service. The patient expressed understanding and agreed to proceed.  Patient is at home accompanied by herself who provided/contributed to the history.  Provider is at the office.  Visit start time: 1:32 PM Visit end time: 1:50 PM Insurance consent/check in by: front desk Medical consent and medical assistant/nurse: Mary Chase   History of Present Illness:  She is a 87 y.o. female, who is being followed for moderate persistent asthma and CRS with nasal polyp. Her previous allergy office visit was in 11/26/2022 with  Dr Ernst Bowler .  At the visit, she was doing better symptomatically so they planned to continue [place] and stop Singulair.  She did not wish to pursue any surgical measures for nasal polyps and continues on Flonase 1 SEN daily.  They have considered Dupixent in the past.   Reports a few days ago, she developed a cold like illness with congestion, drainage followed by cough with clear mucous.  She then started noticing worsening shortness of breath and wheezing.  Reports in the past, this usually turns into a worse asthma flare up so she is calling today to get it under control.  No fevers, No sick contacts.  She has not tested for COVID.  She is taking Wixela 250 1 puffs BID and albuterol  2-3x/day for the past few days.  The albuterol does help but the symptoms tend to return a few hours later.  She is also using Flonase 1 SEN daily.    Otherwise, there have been no changes to her past medical history, surgical history, family history, or social history.  Assessment and Plan:  Mary Chase is a 87 y.o. female with:  Moderate persistent asthma, uncomplicated  Chronic rhinitis  Viral URI  1. Moderate persistent asthma with acute exacerbation - Will do short prednisone course with '40mg'$  daily for 2 days, '20mg'$  daily for 2 days and '10mg'$  daily for 1 day.  Informed to take this with food.  - Daily controller medication(s): Wixela 250/50 mcg one puff TWICE DAILY  - Prior to physical activity: albuterol 2 puffs 10-15 minutes before physical activity. - Rescue medications: albuterol 4 puffs every 4-6 hours as needed - Asthma control goals:  * Full participation in all desired activities (may need albuterol before activity) * Albuterol use two time or less a week on average (not counting use with activity) * Cough interfering with sleep two time or less a month * Oral steroids no more than once a year * No hospitalizations  2. Chronic rhinitis and nasal polyps Viral URI - Start nasal rinses.   - Continue with Flonase one spray per nostril daily. - Use OTC Mucinex as needed.   Given strict precautions to go to the ER or urgent care if her symptoms worse with trouble breathing, wheezing, coughing, fevers despite initiation of Prednisone and albuterol use.  Also consider COVID and  flu testing.     Diagnostics: None.  Medication List:  Current Outpatient Medications  Medication Sig Dispense Refill   albuterol (VENTOLIN HFA) 108 (90 Base) MCG/ACT inhaler Inhale 2 puffs into the lungs every 6 (six) hours as needed for wheezing. 1 each 1   amLODipine (NORVASC) 10 MG tablet Take 10 mg by mouth daily.     aspirin EC 81 MG tablet Take 1 tablet (81 mg total) by mouth 2 (two) times  daily. 60 tablet 0   Cholecalciferol (VITAMIN D3) 25 MCG (1000 UT) CAPS Take 1,000 Units by mouth daily in the afternoon.     Cyanocobalamin (VITAMIN B-12) 5000 MCG TBDP Take 5,000 mcg by mouth daily.     fluticasone (FLONASE) 50 MCG/ACT nasal spray Place 1 spray into both nostrils daily as needed for allergies or rhinitis. 16 g 5   fluticasone-salmeterol (WIXELA INHUB) 250-50 MCG/ACT AEPB Inhale 1 puff into the lungs in the morning and at bedtime. 60 each 5   lisinopril (ZESTRIL) 40 MG tablet Take 40 mg by mouth daily.     montelukast (SINGULAIR) 10 MG tablet Take 1 tablet (10 mg total) by mouth daily. 30 tablet 5   No current facility-administered medications for this visit.   Allergies: Allergies  Allergen Reactions   Penicillins Shortness Of Breath and Rash   Keflex [Cephalexin] Itching   Sulfa Antibiotics Itching and Other (See Comments)    Swelling in mouth   Other Itching    Some beans    I reviewed her past medical history, social history, family history, and environmental history and no significant changes have been reported from previous visits.  Review of Systems  Objective:  Physical exam not obtained as encounter was done via telephone.   Previous notes and tests were reviewed.  I discussed the assessment and treatment plan with the patient. The patient was provided an opportunity to ask questions and all were answered. The patient agreed with the plan and demonstrated an understanding of the instructions.   The patient was advised to call back or seek an in-person evaluation if the symptoms worsen or if the condition fails to improve as anticipated.  I provided 18 minutes of non-face-to-face time during this encounter.  Harlon Flor, MD [facility]

## 2022-12-18 NOTE — Patient Instructions (Signed)
1. Moderate persistent asthma with acute exacerbation - Will do short prednisone course with '40mg'$  daily for 2 days, '20mg'$  daily for 2 days and '10mg'$  daily for 1 day.  Informed to take this with food.   - Daily controller medication(s): Wixela 250/50 mcg one puff TWICE DAILY  - Prior to physical activity: albuterol 2 puffs 10-15 minutes before physical activity. - Rescue medications: albuterol 4 puffs every 4-6 hours as needed - Asthma control goals:  * Full participation in all desired activities (may need albuterol before activity) * Albuterol use two time or less a week on average (not counting use with activity) * Cough interfering with sleep two time or less a month * Oral steroids no more than once a year * No hospitalizations  2. Chronic rhinitis and nasal polyps (L>>R) Viral URI - Start nasal rinses.   - Continue with Flonase one spray per nostril daily. - Use OTC Mucinex as needed.

## 2023-01-28 DIAGNOSIS — H353121 Nonexudative age-related macular degeneration, left eye, early dry stage: Secondary | ICD-10-CM | POA: Diagnosis not present

## 2023-01-28 DIAGNOSIS — H524 Presbyopia: Secondary | ICD-10-CM | POA: Diagnosis not present

## 2023-01-28 DIAGNOSIS — R7309 Other abnormal glucose: Secondary | ICD-10-CM | POA: Diagnosis not present

## 2023-01-28 DIAGNOSIS — H35033 Hypertensive retinopathy, bilateral: Secondary | ICD-10-CM | POA: Diagnosis not present

## 2023-01-28 DIAGNOSIS — Z961 Presence of intraocular lens: Secondary | ICD-10-CM | POA: Diagnosis not present

## 2023-04-21 ENCOUNTER — Other Ambulatory Visit: Payer: Self-pay | Admitting: Allergy & Immunology

## 2023-04-29 DIAGNOSIS — E7800 Pure hypercholesterolemia, unspecified: Secondary | ICD-10-CM | POA: Diagnosis not present

## 2023-04-29 DIAGNOSIS — I10 Essential (primary) hypertension: Secondary | ICD-10-CM | POA: Diagnosis not present

## 2023-04-29 DIAGNOSIS — N1831 Chronic kidney disease, stage 3a: Secondary | ICD-10-CM | POA: Diagnosis not present

## 2023-04-29 DIAGNOSIS — I739 Peripheral vascular disease, unspecified: Secondary | ICD-10-CM | POA: Diagnosis not present

## 2023-04-30 ENCOUNTER — Other Ambulatory Visit: Payer: Self-pay | Admitting: Allergy & Immunology

## 2023-05-03 ENCOUNTER — Inpatient Hospital Stay (HOSPITAL_COMMUNITY)
Admission: EM | Admit: 2023-05-03 | Discharge: 2023-05-11 | DRG: 189 | Disposition: A | Discharge status: Home Health | Payer: Medicare Other | Attending: Internal Medicine | Admitting: Internal Medicine

## 2023-05-03 ENCOUNTER — Emergency Department (HOSPITAL_COMMUNITY): Payer: Medicare Other

## 2023-05-03 ENCOUNTER — Other Ambulatory Visit: Payer: Self-pay

## 2023-05-03 ENCOUNTER — Encounter (HOSPITAL_COMMUNITY): Payer: Self-pay

## 2023-05-03 DIAGNOSIS — D573 Sickle-cell trait: Secondary | ICD-10-CM | POA: Diagnosis present

## 2023-05-03 DIAGNOSIS — Z880 Allergy status to penicillin: Secondary | ICD-10-CM | POA: Diagnosis not present

## 2023-05-03 DIAGNOSIS — R062 Wheezing: Secondary | ICD-10-CM | POA: Diagnosis not present

## 2023-05-03 DIAGNOSIS — J441 Chronic obstructive pulmonary disease with (acute) exacerbation: Secondary | ICD-10-CM | POA: Diagnosis not present

## 2023-05-03 DIAGNOSIS — T380X5A Adverse effect of glucocorticoids and synthetic analogues, initial encounter: Secondary | ICD-10-CM | POA: Diagnosis present

## 2023-05-03 DIAGNOSIS — B348 Other viral infections of unspecified site: Secondary | ICD-10-CM | POA: Diagnosis not present

## 2023-05-03 DIAGNOSIS — K219 Gastro-esophageal reflux disease without esophagitis: Secondary | ICD-10-CM | POA: Diagnosis present

## 2023-05-03 DIAGNOSIS — Z882 Allergy status to sulfonamides status: Secondary | ICD-10-CM | POA: Diagnosis not present

## 2023-05-03 DIAGNOSIS — R0602 Shortness of breath: Secondary | ICD-10-CM | POA: Diagnosis not present

## 2023-05-03 DIAGNOSIS — R0689 Other abnormalities of breathing: Secondary | ICD-10-CM | POA: Diagnosis not present

## 2023-05-03 DIAGNOSIS — J45901 Unspecified asthma with (acute) exacerbation: Secondary | ICD-10-CM

## 2023-05-03 DIAGNOSIS — Z79899 Other long term (current) drug therapy: Secondary | ICD-10-CM

## 2023-05-03 DIAGNOSIS — J9601 Acute respiratory failure with hypoxia: Secondary | ICD-10-CM | POA: Diagnosis not present

## 2023-05-03 DIAGNOSIS — I10 Essential (primary) hypertension: Secondary | ICD-10-CM

## 2023-05-03 DIAGNOSIS — D72829 Elevated white blood cell count, unspecified: Secondary | ICD-10-CM | POA: Diagnosis not present

## 2023-05-03 DIAGNOSIS — R0902 Hypoxemia: Secondary | ICD-10-CM

## 2023-05-03 DIAGNOSIS — Z881 Allergy status to other antibiotic agents status: Secondary | ICD-10-CM | POA: Diagnosis not present

## 2023-05-03 DIAGNOSIS — R000 Tachycardia, unspecified: Secondary | ICD-10-CM | POA: Diagnosis not present

## 2023-05-03 DIAGNOSIS — Z7982 Long term (current) use of aspirin: Secondary | ICD-10-CM | POA: Diagnosis not present

## 2023-05-03 DIAGNOSIS — Z96641 Presence of right artificial hip joint: Secondary | ICD-10-CM | POA: Diagnosis present

## 2023-05-03 DIAGNOSIS — Z7951 Long term (current) use of inhaled steroids: Secondary | ICD-10-CM

## 2023-05-03 LAB — CBC
HCT: 36.1 % (ref 36.0–46.0)
Hemoglobin: 12.1 g/dL (ref 12.0–15.0)
MCH: 31.5 pg (ref 26.0–34.0)
MCHC: 33.5 g/dL (ref 30.0–36.0)
MCV: 94 fL (ref 80.0–100.0)
Platelets: 192 10*3/uL (ref 150–400)
RBC: 3.84 MIL/uL — ABNORMAL LOW (ref 3.87–5.11)
RDW: 13.1 % (ref 11.5–15.5)
WBC: 11.7 10*3/uL — ABNORMAL HIGH (ref 4.0–10.5)
nRBC: 0 % (ref 0.0–0.2)

## 2023-05-03 LAB — RESPIRATORY PANEL BY PCR

## 2023-05-03 LAB — BASIC METABOLIC PANEL
Anion gap: 11 (ref 5–15)
BUN: 7 mg/dL — ABNORMAL LOW (ref 8–23)
CO2: 23 mmol/L (ref 22–32)
Calcium: 8.7 mg/dL — ABNORMAL LOW (ref 8.9–10.3)
Chloride: 104 mmol/L (ref 98–111)
Creatinine, Ser: 0.97 mg/dL (ref 0.44–1.00)
GFR, Estimated: 57 mL/min — ABNORMAL LOW (ref >60)
Glucose, Bld: 185 mg/dL — ABNORMAL HIGH (ref 70–99)
Potassium: 3.3 mmol/L — ABNORMAL LOW (ref 3.5–5.1)
Sodium: 138 mmol/L (ref 135–145)

## 2023-05-03 LAB — TROPONIN I (HIGH SENSITIVITY): Troponin I (High Sensitivity): 6 ng/L (ref <18)

## 2023-05-03 LAB — BRAIN NATRIURETIC PEPTIDE: B Natriuretic Peptide: 43.6 pg/mL (ref 0.0–100.0)

## 2023-05-03 MED ORDER — MAGNESIUM SULFATE 2 GM/50ML IV SOLN: 2.0000 g | Freq: Once | INTRAVENOUS | Status: DC

## 2023-05-03 MED ORDER — IPRATROPIUM-ALBUTEROL 0.5-2.5 (3) MG/3ML IN SOLN: 3.0000 mL | Freq: Once | RESPIRATORY_TRACT | Status: DC

## 2023-05-03 MED ORDER — ENOXAPARIN SODIUM 40 MG/0.4ML IJ SOSY
40.0000 mg | PREFILLED_SYRINGE | INTRAMUSCULAR | Status: DC
  Administered 2023-05-03 – 2023-05-10 (×8): 40 mg via SUBCUTANEOUS
  Filled 2023-05-03 (×8): qty 0.4

## 2023-05-03 MED ORDER — PREDNISONE 5 MG PO TABS
50.0000 mg | ORAL_TABLET | Freq: Every day | ORAL | Status: DC
  Administered 2023-05-04: 50 mg via ORAL
  Filled 2023-05-03 (×2): qty 2

## 2023-05-03 MED ORDER — METHYLPREDNISOLONE SODIUM SUCC 125 MG IJ SOLR: 125.0000 mg | INTRAMUSCULAR | Status: DC

## 2023-05-03 MED ORDER — ACETAMINOPHEN 650 MG RE SUPP: 650.0000 mg | Freq: Four times a day (QID) | RECTAL | Status: DC | PRN

## 2023-05-03 MED ORDER — ALBUTEROL SULFATE (2.5 MG/3ML) 0.083% IN NEBU
10.0000 mg/h | INHALATION_SOLUTION | Freq: Once | RESPIRATORY_TRACT | Status: AC
  Administered 2023-05-03: 10 mg/h via RESPIRATORY_TRACT
  Filled 2023-05-03: qty 12

## 2023-05-03 MED ORDER — IPRATROPIUM BROMIDE 0.02 % IN SOLN
0.5000 mg | Freq: Four times a day (QID) | RESPIRATORY_TRACT | Status: DC
  Administered 2023-05-03 – 2023-05-04 (×4): 0.5 mg via RESPIRATORY_TRACT
  Filled 2023-05-03 (×4): qty 2.5

## 2023-05-03 MED ORDER — LISINOPRIL 20 MG PO TABS
40.0000 mg | ORAL_TABLET | Freq: Every day | ORAL | Status: DC
  Administered 2023-05-04: 40 mg via ORAL
  Filled 2023-05-03: qty 2

## 2023-05-03 MED ORDER — ALBUTEROL SULFATE (2.5 MG/3ML) 0.083% IN NEBU
2.5000 mg | INHALATION_SOLUTION | RESPIRATORY_TRACT | Status: DC | PRN
  Administered 2023-05-04 (×2): 2.5 mg via RESPIRATORY_TRACT
  Filled 2023-05-03 (×2): qty 3

## 2023-05-03 MED ORDER — POLYETHYLENE GLYCOL 3350 17 G PO PACK
17.0000 g | PACK | Freq: Every day | ORAL | Status: DC | PRN
  Filled 2023-05-03: qty 1

## 2023-05-03 MED ORDER — MONTELUKAST SODIUM 10 MG PO TABS
10.0000 mg | ORAL_TABLET | Freq: Every day | ORAL | Status: DC
  Administered 2023-05-04 – 2023-05-11 (×8): 10 mg via ORAL
  Filled 2023-05-03 (×8): qty 1

## 2023-05-03 MED ORDER — MOMETASONE FURO-FORMOTEROL FUM 200-5 MCG/ACT IN AERO
2.0000 | INHALATION_SPRAY | Freq: Two times a day (BID) | RESPIRATORY_TRACT | Status: DC
  Administered 2023-05-03 – 2023-05-11 (×16): 2 via RESPIRATORY_TRACT
  Filled 2023-05-03 (×3): qty 8.8

## 2023-05-03 MED ORDER — AMLODIPINE BESYLATE 10 MG PO TABS
10.0000 mg | ORAL_TABLET | Freq: Every day | ORAL | Status: DC
  Administered 2023-05-04 – 2023-05-11 (×8): 10 mg via ORAL
  Filled 2023-05-03 (×8): qty 1

## 2023-05-03 MED ORDER — SODIUM CHLORIDE 0.9% FLUSH
3.0000 mL | Freq: Two times a day (BID) | INTRAVENOUS | Status: DC
  Administered 2023-05-03 – 2023-05-10 (×15): 3 mL via INTRAVENOUS

## 2023-05-03 MED ORDER — ACETAMINOPHEN 325 MG PO TABS: 650.0000 mg | ORAL_TABLET | Freq: Four times a day (QID) | ORAL | Status: DC | PRN

## 2023-05-03 MED ORDER — SODIUM CHLORIDE 0.9 % IV BOLUS
500.0000 mL | Freq: Once | INTRAVENOUS | Status: AC
  Administered 2023-05-03: 500 mL via INTRAVENOUS

## 2023-05-03 NOTE — ED Provider Notes (Signed)
[place] EMERGENCY DEPARTMENT AT [facility] Provider Note   CSN: [id number] Arrival date & time: 05/03/23  1113     History  Chief Complaint  Patient presents with   Shortness of Breath    Mary Chase is a 88 y.o. female with medical history of asthma, COPD not on oxygen, hypertension, sickle cell anemia, pneumonia.  Patient presents to the ED for evaluation of shortness of breath.  Patient states that for the last 2 days she has had increased shortness of breath and respiratory distress secondary to wheezing.  She states that she has been using albuterol treatments at home but they are not relieving her symptoms.  She is denying chest pain, abdominal pain, nausea or vomiting.  She is endorsing lightheadedness, dizziness especially worse with ambulation.  The patient was found to have an oxygen saturation of 88% with EMS.  EMS provided her 2 DuoNeb's, 10 mg albuterol, 125 Solu-Medrol, 2 mg magnesium.  Patient reports that her shortness of breath has slightly improved ever since receiving treatments however she is still very short of breath.  She is tachycardic and hypertensive in the room.  She denies any recent fevers.  She denies ever being intubated secondary to her asthma.  She denies smoking.   Shortness of Breath Associated symptoms: cough and wheezing   Associated symptoms: no abdominal pain, no chest pain, no fever and no vomiting        Home Medications Prior to Admission medications   Medication Sig Start Date End Date Taking? Authorizing Provider  fluticasone (FLONASE) 50 MCG/ACT nasal spray PLACE 1 SPRAY INTO BOTH NOSTRILS DAILY AS NEEDED FOR ALLERGIES OR RHINITIS. 04/21/23   Birder Robson, MD  albuterol (VENTOLIN HFA) 108 (90 Base) MCG/ACT inhaler INHALE 2 PUFFS INTO THE LUNGS EVERY 6 HOURS AS NEEDED FOR WHEEZE 04/30/23   Alfonse Spruce, MD  amLODipine (NORVASC) 10 MG tablet Take 10 mg by mouth daily. 11/23/22   [provider]  aspirin EC 81  MG tablet Take 1 tablet (81 mg total) by mouth 2 (two) times daily. 09/16/20   Allena Katz, PA-C  Cholecalciferol (VITAMIN D3) 25 MCG (1000 UT) CAPS Take 1,000 Units by mouth daily in the afternoon.    [provider]  Cyanocobalamin (VITAMIN B-12) 5000 MCG TBDP Take 5,000 mcg by mouth daily.    [provider]  fluticasone-salmeterol (WIXELA INHUB) 250-50 MCG/ACT AEPB Inhale 1 puff into the lungs in the morning and at bedtime. 09/10/22   Alfonse Spruce, MD  lisinopril (ZESTRIL) 40 MG tablet Take 40 mg by mouth daily. 09/11/19   [provider]  montelukast (SINGULAIR) 10 MG tablet Take 1 tablet (10 mg total) by mouth daily. 09/10/22   Alfonse Spruce, MD      Allergies    Penicillins, Keflex [cephalexin], Sulfa antibiotics, and Other    Review of Systems   Review of Systems  Constitutional:  Negative for fever.  Respiratory:  Positive for cough, shortness of breath and wheezing.   Cardiovascular:  Negative for chest pain.  Gastrointestinal:  Negative for abdominal pain, nausea and vomiting.  Neurological:  Positive for dizziness and light-headedness.  All other systems reviewed and are negative.   Physical Exam Updated Vital Signs BP 124/71   Pulse (!) 104   Temp 98.8 F (37.1 C) (Oral)   Resp 19   SpO2 100%  Physical Exam Vitals and nursing note reviewed.  Constitutional:      General: She  is not in acute distress.    Appearance: Normal appearance. She is not ill-appearing, toxic-appearing or diaphoretic.  HENT:     Head: Normocephalic and atraumatic.     Nose: Nose normal.     Mouth/Throat:     Mouth: Mucous membranes are moist.     Pharynx: Oropharynx is clear.  Eyes:     Extraocular Movements: Extraocular movements intact.     Conjunctiva/sclera: Conjunctivae normal.     Pupils: Pupils are equal, round, and reactive to light.  Cardiovascular:     Rate and Rhythm: Normal rate and regular rhythm.  Pulmonary:     Effort:  Respiratory distress present.     Breath sounds: Wheezing present.  Abdominal:     General: Abdomen is flat. Bowel sounds are normal.     Palpations: Abdomen is soft.     Tenderness: There is no abdominal tenderness.  Musculoskeletal:     Cervical back: Normal range of motion and neck supple. No tenderness.     Right lower leg: Edema present.     Left lower leg: Edema present.  Skin:    General: Skin is warm and dry.     Capillary Refill: Capillary refill takes less than 2 seconds.  Neurological:     Mental Status: She is alert and oriented to person, place, and time.     ED Results / Procedures / Treatments   Labs (all labs ordered are listed, but only abnormal results are displayed) Labs Reviewed  CBC - Abnormal; Notable for the following components:      Result Value   WBC 11.7 (*)    RBC 3.84 (*)    All other components within normal limits  BASIC METABOLIC PANEL - Abnormal; Notable for the following components:   Potassium 3.3 (*)    Glucose, Bld 185 (*)    BUN 7 (*)    Calcium 8.7 (*)    GFR, Estimated 57 (*)    All other components within normal limits  BRAIN NATRIURETIC PEPTIDE  TROPONIN I (HIGH SENSITIVITY)    EKG None  Radiology DG Chest Portable 1 View  Result Date: 05/03/2023 CLINICAL DATA:  Shortness of breath EXAM: PORTABLE CHEST 1 VIEW COMPARISON:  08/28/2022 FINDINGS: No focal consolidation. No pleural effusion or pneumothorax. Heart and mediastinal contours are unremarkable. No acute osseous abnormality. Severe osteoarthritis of bilateral glenohumeral joints. IMPRESSION: 1. No acute cardiopulmonary disease. Electronically Signed   By: Elige Ko M.D.   On: 05/03/2023 13:05    Procedures .Critical Care  Performed by: Al Decant, PA-C Authorized by: Al Decant, PA-C   Critical care provider statement:    Critical care time (minutes):  75   Critical care time was exclusive of:  Separately billable procedures and treating other  patients   Critical care was necessary to treat or prevent imminent or life-threatening deterioration of the following conditions:  Respiratory failure   Critical care was time spent personally by me on the following activities:  Blood draw for specimens, development of treatment plan with patient or surrogate, discussions with consultants, discussions with primary provider, examination of patient, evaluation of patient's response to treatment, interpretation of cardiac output measurements, obtaining history from patient or surrogate, vascular access procedures, review of old charts, re-evaluation of patient's condition, pulse oximetry, ordering and review of radiographic studies, ordering and performing treatments and interventions and ordering and review of laboratory studies   I assumed direction of critical care for this patient from another provider in  my specialty: no     Care discussed with: admitting provider      Medications Ordered in ED Medications  amLODipine (NORVASC) tablet 10 mg (has no administration in time range)  lisinopril (ZESTRIL) tablet 40 mg (has no administration in time range)  mometasone-formoterol (DULERA) 200-5 MCG/ACT inhaler 2 puff (has no administration in time range)  montelukast (SINGULAIR) tablet 10 mg (has no administration in time range)  enoxaparin (LOVENOX) injection 40 mg (has no administration in time range)  sodium chloride flush (NS) 0.9 % injection 3 mL (has no administration in time range)  acetaminophen (TYLENOL) tablet 650 mg (has no administration in time range)    Or  acetaminophen (TYLENOL) suppository 650 mg (has no administration in time range)  polyethylene glycol (MIRALAX / GLYCOLAX) packet 17 g (has no administration in time range)  sodium chloride 0.9 % bolus 500 mL (has no administration in time range)  ipratropium (ATROVENT) nebulizer solution 0.5 mg (has no administration in time range)  albuterol (PROVENTIL) (2.5 MG/3ML) 0.083%  nebulizer solution 2.5 mg (has no administration in time range)  predniSONE (DELTASONE) tablet 50 mg (has no administration in time range)  albuterol (PROVENTIL) (2.5 MG/3ML) 0.083% nebulizer solution (10 mg/hr Nebulization Given 05/03/23 1209)    ED Course/ Medical Decision Making/ A&P  Medical Decision Making Amount and/or Complexity of Data Reviewed Labs: ordered. Radiology: ordered.  Risk Prescription drug management. Decision regarding hospitalization.   88 year old female presents to the ED for evaluation.  Please see HPI for further details.  On examination the patient is afebrile, tachycardic in the 120s.  Her lung sounds have wheezing throughout, she is hypoxic on room air with an oxygen saturation of 88%.  Her abdomen is soft and compressible.  She is alert and oriented x 3.  She does have bilateral edema to her lower extremities that is nonpitting 1+.  CBC with leukocytosis to 11.7, no anemia.  Patient BMP with potassium 3.3, glucose 185, anion gap 11.  Creatinine 0.97 baseline.  Patient troponin 6.  BNP within normal limits at 43.6.  Chest x-ray showed no consolidation or effusion.  Upon arrival, the patient appeared very tired.  She had an oxygen saturation of 90% on nasal cannula at 3 L a minute.  The patient was transitioned to nonrebreather at this time with an oxygen saturation of 100% on 12 L.  She was wheezing throughout and had DuoNebs ordered.  Prior to arrival the patient had 2 DuoNebs with EMS, 2 g magnesium sulfate, 125 Solu-Medrol, 10 g of albuterol.  The patient stated that she was tiring out so she was transition to BiPAP at this time.  After DuoNeb, the patient lung sounds are improved, she still has slightly rhonchorous lung sounds bilaterally but much improved from initially.    Patient will need admission as she is not able to be weaned off of BiPAP at this time.  She is hypoxic.  [facility] hospitalist, Dr. Alinda Money, has agreed to admit the patient.  She is stable at  time of admission.  Final Clinical Impression(s) / ED Diagnoses Final diagnoses:  COPD exacerbation (HCC)  Hypoxia  Acute respiratory failure with hypoxia [facility])    Rx / DC Orders ED Discharge Orders     None         Al Decant, PA-C 05/03/23 1505    Pricilla Loveless, MD 05/04/23 [phone]

## 2023-05-03 NOTE — ED Triage Notes (Signed)
Pt BIB [facility] from home with respiratory distress that started a couple of days ago (per pt). Does have a Hx of COPD & has been taking her home meds with no improvement. EMS arrived on scene & pt had diminished lung sounds with some wheezing heard all over. A/Ox4, denies pain, 20g PIV Lt AC, was 88% on RA upon their arrival on scene. Has been given O2 with Neds while en route to ED & stayed in the high 90's spo2. Was given 2 Duo Nebs, 10mg  Albuterol, 2g Mag, 1mg  Atrovent & 125 Solu Medrol. Was hypertensive for them in the 180s SBP & tachy in the upper 120's bpm heart rate.

## 2023-05-03 NOTE — ED Notes (Signed)
RT at bedside.

## 2023-05-03 NOTE — ED Notes (Signed)
ED TO INPATIENT HANDOFF REPORT  ED Nurse Name and Phone #: Beatris Ship RN [phone]  S Name/Age/Gender Mary Chase 88 y.o. female Room/Bed: 033C/033C  Code Status   Code Status: Full Code  Home/SNF/Other Home Patient oriented to: self, place, time, and situation Is this baseline? Yes   Triage Complete: Triage complete  Chief Complaint Acute respiratory failure with hypoxia (HCC) [J96.01]  Triage Note Pt BIB [facility] from home with respiratory distress that started a couple of days ago (per pt). Does have a Hx of COPD & has been taking her home meds with no improvement. EMS arrived on scene & pt had diminished lung sounds with some wheezing heard all over. A/Ox4, denies pain, 20g PIV Lt AC, was 88% on RA upon their arrival on scene. Has been given O2 with Neds while en route to ED & stayed in the high 90's spo2. Was given 2 Duo Nebs, 10mg  Albuterol, 2g Mag, 1mg  Atrovent & 125 Solu Medrol. Was hypertensive for them in the 180s SBP & tachy in the upper 120's bpm heart rate.   Allergies Allergies  Allergen Reactions   Penicillins Shortness Of Breath and Rash   Keflex [Cephalexin] Itching   Sulfa Antibiotics Itching and Other (See Comments)    Swelling in mouth   Other Itching    Some beans     Level of Care/Admitting Diagnosis ED Disposition     ED Disposition  Admit   Condition  --   Comment  Hospital [facility]: [facility] [[id number]]  Level of Care: Progressive [102]  Admit to Progressive based on following criteria: RESPIRATORY PROBLEMS hypoxemic/hypercapnic respiratory failure that is responsive to NIPPV (BiPAP) or High Flow Nasal Cannula (6-80 lpm). Frequent assessment/intervention, no > Q2 hrs < Q4 hrs, to maintain oxygenation and pulmonary hygiene.  May place patient in observation at [facility] or [facility] if equivalent level of care is available:: No  Covid Evaluation: Asymptomatic - no recent exposure (last 10 days) testing not required  Diagnosis: Acute  respiratory failure with hypoxia [facility]) [[id number]]  Admitting Physician: Synetta Fail [[id number]]  Attending Physician: Synetta Fail [id number]          B Medical/Surgery History Past Medical History:  Diagnosis Date   Arthritis    Asthma    Cataract    COPD (chronic obstructive pulmonary disease) (HCC)    Dyspnea    with exertion   GERD (gastroesophageal reflux disease)    Headache    Hypertension    Pneumonia    Pre-diabetes    A1c within normal limits last check   Sickle cell anemia (HCC)    trait   Sickle cell trait (HCC)    Past Surgical History:  Procedure Laterality Date   ANKLE SURGERY Right 02   pin   BREAST BIOPSY Right 11/01/2013   Procedure: RIGHT BREAST MASS EXCISION;  Surgeon: Emelia Loron, MD;  Location: [facility] OR;  Service: General;  Laterality: Right;   ELBOW SURGERY Right    pin   MULTIPLE TOOTH EXTRACTIONS     TOTAL HIP ARTHROPLASTY Right 09/16/2020   Procedure: RIGHT TOTAL HIP ARTHROPLASTY ANTERIOR APPROACH;  Surgeon: Gean Birchwood, MD;  Location: [facility] ORS;  Service: Orthopedics;  Laterality: Right;   TUBAL LIGATION       A IV Location/Drains/Wounds Patient Lines/Drains/Airways Status     Active Line/Drains/Airways     Name Placement date Placement time Site Days   Peripheral IV 05/03/23 20 G Left Antecubital 05/03/23  1216  Antecubital  less than 1   Incision (Closed) 09/16/20 Hip Right 09/16/20  1046  -- 959            Intake/Output Last 24 hours No intake or output data in the 24 hours ending 05/03/23 1658  Labs/Imaging Results for orders placed or performed during the hospital encounter of 05/03/23 (from the past 48 hour(s))  CBC     Status: Abnormal   Collection Time: 05/03/23 12:55 PM  Result Value Ref Range   WBC 11.7 (H) 4.0 - 10.5 K/uL   RBC 3.84 (L) 3.87 - 5.11 MIL/uL   Hemoglobin 12.1 12.0 - 15.0 g/dL   HCT 16.1 09.6 - 04.5 %   MCV 94.0 80.0 - 100.0 fL   MCH 31.5 26.0 - 34.0 pg   MCHC 33.5 30.0 - 36.0 g/dL    RDW 40.9 81.1 - 91.4 %   Platelets 192 150 - 400 K/uL   nRBC 0.0 0.0 - 0.2 %    Comment: Performed at [facility], [address], [place], [place] [address]  Basic metabolic panel     Status: Abnormal   Collection Time: 05/03/23 12:55 PM  Result Value Ref Range   Sodium 138 135 - 145 mmol/L   Potassium 3.3 (L) 3.5 - 5.1 mmol/L   Chloride 104 98 - 111 mmol/L   CO2 23 22 - 32 mmol/L   Glucose, Bld 185 (H) 70 - 99 mg/dL    Comment: Glucose reference range applies only to samples taken after fasting for at least 8 hours.   BUN 7 (L) 8 - 23 mg/dL   Creatinine, Ser 6.21 0.44 - 1.00 mg/dL   Calcium 8.7 (L) 8.9 - 10.3 mg/dL   GFR, Estimated 57 (L) >60 mL/min    Comment: (NOTE) Calculated using the CKD-EPI Creatinine Equation (2021)    Anion gap 11 5 - 15    Comment: Performed at [facility], [address] [place], [place] [address]  Troponin I (High Sensitivity)     Status: None   Collection Time: 05/03/23 12:55 PM  Result Value Ref Range   Troponin I (High Sensitivity) 6 <18 ng/L    Comment: (NOTE) Elevated high sensitivity troponin I (hsTnI) values and significant  changes across serial measurements may suggest ACS but many other  chronic and acute conditions are known to elevate hsTnI results.  Refer to the "Links" section for chest pain algorithms and additional  guidance. Performed at [facility], [address] [place], [place] [address]   Brain natriuretic peptide     Status: None   Collection Time: 05/03/23 12:55 PM  Result Value Ref Range   B Natriuretic Peptide 43.6 0.0 - 100.0 pg/mL    Comment: Performed at [facility], [address] [place], [place] [address]   DG Chest Portable 1 View  Result Date: 05/03/2023 CLINICAL DATA:  Shortness of breath EXAM: PORTABLE CHEST 1 VIEW COMPARISON:  08/28/2022 FINDINGS: No focal consolidation. No pleural effusion or pneumothorax. Heart and mediastinal contours are unremarkable. No acute osseous  abnormality. Severe osteoarthritis of bilateral glenohumeral joints. IMPRESSION: 1. No acute cardiopulmonary disease. Electronically Signed   By: Elige Ko M.D.   On: 05/03/2023 13:05    Pending Labs Unresulted Labs (From admission, onward)     Start     Ordered   05/10/23 0500  Creatinine, serum  (enoxaparin (LOVENOX)    CrCl >/= 30 ml/min)  Weekly,  R     Comments: while on enoxaparin therapy    05/03/23 1449   05/04/23 0500  Comprehensive metabolic panel  Tomorrow morning,   R        05/03/23 1449   05/04/23 0500  CBC  Tomorrow morning,   R        05/03/23 1449            Vitals/Pain Today's Vitals   05/03/23 1530 05/03/23 1559 05/03/23 1600 05/03/23 1630  BP: 129/82  124/77 118/79  Pulse: (!) 110  (!) 108 (!) 109  Resp: 19  (!) 21 (!) 22  Temp:      TempSrc:      SpO2: 100% 100% 100% 100%  PainSc:        Isolation Precautions No active isolations  Medications Medications  amLODipine (NORVASC) tablet 10 mg (has no administration in time range)  lisinopril (ZESTRIL) tablet 40 mg (has no administration in time range)  mometasone-formoterol (DULERA) 200-5 MCG/ACT inhaler 2 puff (has no administration in time range)  montelukast (SINGULAIR) tablet 10 mg (has no administration in time range)  enoxaparin (LOVENOX) injection 40 mg (40 mg Subcutaneous Given 05/03/23 1540)  sodium chloride flush (NS) 0.9 % injection 3 mL (0 mLs Intravenous Hold 05/03/23 1527)  acetaminophen (TYLENOL) tablet 650 mg (has no administration in time range)    Or  acetaminophen (TYLENOL) suppository 650 mg (has no administration in time range)  polyethylene glycol (MIRALAX / GLYCOLAX) packet 17 g (has no administration in time range)  ipratropium (ATROVENT) nebulizer solution 0.5 mg (0.5 mg Nebulization Given 05/03/23 1559)  albuterol (PROVENTIL) (2.5 MG/3ML) 0.083% nebulizer solution 2.5 mg (has no administration in time range)  predniSONE (DELTASONE) tablet 50 mg (has no administration in  time range)  albuterol (PROVENTIL) (2.5 MG/3ML) 0.083% nebulizer solution (10 mg/hr Nebulization Given 05/03/23 1209)  sodium chloride 0.9 % bolus 500 mL (500 mLs Intravenous New Bag/Given 05/03/23 1537)    Mobility walks     Focused Assessments Pulmonary Assessment Handoff:  Lung sounds: Bilateral Breath Sounds: Diminished, Expiratory wheezes, Coarse crackles L Breath Sounds: Diminished R Breath Sounds: Diminished O2 Device: Nasal Cannula O2 Flow Rate (L/min): 4 L/min    R Recommendations: See Admitting Provider Note  Report given to: [id number] RN

## 2023-05-03 NOTE — H&P (Signed)
History and Physical   Terilyn Tsan Heritage ZOX:[id number] DOB: 05/16/1935 DOA: 05/03/2023  PCP: Joycelyn Rua, MD   Patient coming from: Home  Chief Complaint: Shortness of breath  HPI: Mary Chase is a 88 y.o. female with medical history significant of asthma, COPD, GERD, hypertension, sickle cell trait presenting with shortness of breath.  Patient reports 2 days of worsening shortness of breath and wheezing.  Has tried home albuterol inhaler without improvement.  She began to feel lightheaded and dizzy and this was worse with standing.  EMS called today and patient noted to be saturating 88% on room air.  She received 2 DuoNebs, albuterol, Solu-Medrol, magnesium and route.  Patient is reporting some improvement in her breathing but still feels significantly short of breath.  She denies fevers, chills, chest pain, abdominal pain, constipation, diarrhea, nausea, vomiting.  ED Course: Vital signs in the ED notable for blood pressure in the 120s to 160s, heart rate in the 100s to 120s, respiratory rate in the teens to 20s.  Initially requiring 4 L then placed on Ventimask with 8 L and then BiPAP.  Lab workup included BMP with potassium 3.3, glucose 185, calcium 8.7.  CBC with leukocytosis to 11.7.  Troponin negative, BNP negative.  Chest x-ray without acute abnormality.  Patient received albuterol BiPAP in the ED.  Review of Systems: As per HPI otherwise all other systems reviewed and are negative.  Past Medical History:  Diagnosis Date   Arthritis    Asthma    Cataract    COPD (chronic obstructive pulmonary disease) (HCC)    Dyspnea    with exertion   GERD (gastroesophageal reflux disease)    Headache    Hypertension    Pneumonia    Pre-diabetes    A1c within normal limits last check   Sickle cell anemia (HCC)    trait   Sickle cell trait (HCC)     Past Surgical History:  Procedure Laterality Date   ANKLE SURGERY Right 02   pin   BREAST BIOPSY Right 11/01/2013    Procedure: RIGHT BREAST MASS EXCISION;  Surgeon: Emelia Loron, MD;  Location: [facility] OR;  Service: General;  Laterality: Right;   ELBOW SURGERY Right    pin   MULTIPLE TOOTH EXTRACTIONS     TOTAL HIP ARTHROPLASTY Right 09/16/2020   Procedure: RIGHT TOTAL HIP ARTHROPLASTY ANTERIOR APPROACH;  Surgeon: Gean Birchwood, MD;  Location: [facility];  Service: Orthopedics;  Laterality: Right;   TUBAL LIGATION      Social History  reports that she has never smoked. She has never used smokeless tobacco. She reports current alcohol use. She reports that she does not use drugs.  Allergies  Allergen Reactions   Penicillins Shortness Of Breath and Rash   Keflex [Cephalexin] Itching   Sulfa Antibiotics Itching and Other (See Comments)    Swelling in mouth   Other Itching    Some beans     History reviewed. No pertinent family history.   Prior to Admission medications   Medication Sig Start Date End Date Taking? Authorizing Provider  fluticasone (FLONASE) 50 MCG/ACT nasal spray PLACE 1 SPRAY INTO BOTH NOSTRILS DAILY AS NEEDED FOR ALLERGIES OR RHINITIS. 04/21/23   Birder Robson, MD  albuterol (VENTOLIN HFA) 108 (90 Base) MCG/ACT inhaler INHALE 2 PUFFS INTO THE LUNGS EVERY 6 HOURS AS NEEDED FOR WHEEZE 04/30/23   Alfonse Spruce, MD  amLODipine (NORVASC) 10 MG tablet Take 10 mg by mouth daily. 11/23/22   [provider]  aspirin EC 81 MG tablet Take 1 tablet (81 mg total) by mouth 2 (two) times daily. 09/16/20   Allena Katz, PA-C  Cholecalciferol (VITAMIN D3) 25 MCG (1000 UT) CAPS Take 1,000 Units by mouth daily in the afternoon.    [provider]  Cyanocobalamin (VITAMIN B-12) 5000 MCG TBDP Take 5,000 mcg by mouth daily.    [provider]  fluticasone-salmeterol (WIXELA INHUB) 250-50 MCG/ACT AEPB Inhale 1 puff into the lungs in the morning and at bedtime. 09/10/22   Alfonse Spruce, MD  lisinopril (ZESTRIL) 40 MG tablet Take 40 mg by mouth daily. 09/11/19   [provider]  montelukast (SINGULAIR) 10 MG tablet Take 1 tablet (10 mg total) by mouth daily. 09/10/22   Alfonse Spruce, MD    Physical Exam: Vitals:   05/03/23 1242 05/03/23 1245 05/03/23 1404 05/03/23 1500  BP:  133/75 124/71 136/78  Pulse: (!) 122 (!) 122 (!) 104 (!) 101  Resp: (!) 22 (!) 24 19 18   Temp:      TempSrc:      SpO2: 100% 100% 100% 100%    Physical Exam Constitutional:      General: She is not in acute distress.    Appearance: Normal appearance.  HENT:     Head: Normocephalic and atraumatic.     Mouth/Throat:     Mouth: Mucous membranes are moist.     Pharynx: Oropharynx is clear.  Eyes:     Extraocular Movements: Extraocular movements intact.     Pupils: Pupils are equal, round, and reactive to light.  Cardiovascular:     Rate and Rhythm: Normal rate and regular rhythm.     Pulses: Normal pulses.     Heart sounds: Normal heart sounds.  Pulmonary:     Effort: Pulmonary effort is normal. No respiratory distress.     Breath sounds: Wheezing present.  Abdominal:     General: Bowel sounds are normal. There is no distension.     Palpations: Abdomen is soft.     Tenderness: There is no abdominal tenderness.  Musculoskeletal:        General: No swelling or deformity.  Skin:    General: Skin is warm and dry.  Neurological:     General: No focal deficit present.     Mental Status: Mental status is at baseline.    Labs on Admission: I have personally reviewed following labs and imaging studies  CBC: Recent Labs  Lab 05/03/23 1255  WBC 11.7*  HGB 12.1  HCT 36.1  MCV 94.0  PLT 192    Basic Metabolic Panel: Recent Labs  Lab 05/03/23 1255  NA 138  K 3.3*  CL 104  CO2 23  GLUCOSE 185*  BUN 7*  CREATININE 0.97  CALCIUM 8.7*    GFR: CrCl cannot be calculated (Unknown ideal weight.).  Liver Function Tests: No results for input(s): "AST", "ALT", "ALKPHOS", "BILITOT", "PROT", "ALBUMIN" in the last 168 hours.  Urine analysis:     Component Value Date/Time   COLORURINE STRAW (A) 09/10/2020 1426   APPEARANCEUR CLEAR 09/10/2020 1426   LABSPEC 1.005 09/10/2020 1426   PHURINE 8.0 09/10/2020 1426   GLUCOSEU NEGATIVE 09/10/2020 1426   HGBUR NEGATIVE 09/10/2020 1426   BILIRUBINUR NEGATIVE 09/10/2020 1426   KETONESUR NEGATIVE 09/10/2020 1426   PROTEINUR 30 (A) 09/10/2020 1426   NITRITE NEGATIVE 09/10/2020 1426   LEUKOCYTESUR NEGATIVE 09/10/2020 1426    Radiological Exams on Admission: DG Chest Portable 1 View  Result Date: 05/03/2023 CLINICAL DATA:  Shortness of breath EXAM: PORTABLE CHEST 1 VIEW COMPARISON:  08/28/2022 FINDINGS: No focal consolidation. No pleural effusion or pneumothorax. Heart and mediastinal contours are unremarkable. No acute osseous abnormality. Severe osteoarthritis of bilateral glenohumeral joints. IMPRESSION: 1. No acute cardiopulmonary disease. Electronically Signed   By: Elige Ko M.D.   On: 05/03/2023 13:05    EKG: Independently reviewed.  Sinus tachycardia 126 bpm.  Nonspecific T wave flattening.  Low voltage aVL, V2.  Some baseline wander.  Assessment/Plan Principal Problem:   Acute respiratory failure with hypoxia (HCC) Active Problems:   Asthma, chronic, unspecified asthma severity, with acute exacerbation   HTN (hypertension)   Asthma exacerbation (History of COPD listed as well) Acute respiratory failure with hypoxia > Presenting with worsening shortness of breath and wheezing not responding to home inhalers.  Found to be hypoxic to 88%. > In the ED requiring increasing oxygen initially 4 L then 8 L then BiPAP. > EMS interventions included DuoNebs, albuterol, Solu-Medrol, magnesium.  In the ED patient additionally got albuterol and placed on BiPAP as above, due to fatigue. > Some improvement but continues to require oxygen and remains short of breath with wheezing. - Monitor in progressive unit - Continue with BiPAP, wean as tolerated - Continue supple oxygen, wean as  tolerated - Scheduled Atrovent, as needed albuterol - Daily prednisone - Replace home Wixela formulary Dulera - Continue montelukast - Unclear etiology, takes medications, x-ray clear.  Will check respiratory viral panel.  Hypertension - Continue home amlodipine, lisinopril  DVT prophylaxis: Lovenox Code Status:   Full Family Communication:  Updated at bedside  Disposition Plan:   Patient is from:  Home  Anticipated DC to:  Home  Anticipated DC date:  1 to 3 days  Anticipated DC barriers: None  Consults called:  None Admission status:  Observation, progressive  Severity of Illness: The appropriate patient status for this patient is OBSERVATION. Observation status is judged to be reasonable and necessary in order to provide the required intensity of service to ensure the patient's safety. The patient's presenting symptoms, physical exam findings, and initial radiographic and laboratory data in the context of their medical condition is felt to place them at decreased risk for further clinical deterioration. Furthermore, it is anticipated that the patient will be medically stable for discharge from the hospital within 2 midnights of admission.    Synetta Fail MD [facility]  How to contact the [facility] Attending or Consulting provider 7A - 7P or covering provider during after hours 7P -7A, for this patient?   Check the care team in [facility] and look for a) attending/consulting TR[facility] provider listed and b) the [facility] team listed Log into [email or web address] and use [facility] universal password to access. If you do not have the password, please contact the hospital operator. Locate the [facility] provider you are looking for under [facility] and page to a number that you can be directly reached. If you still have difficulty reaching the provider, please page the [facility] (Director on Call) for the Hospitalists listed on amion for assistance.  05/03/2023, 3:19 PM

## 2023-05-04 ENCOUNTER — Observation Stay (HOSPITAL_COMMUNITY): Payer: Medicare Other

## 2023-05-04 DIAGNOSIS — K219 Gastro-esophageal reflux disease without esophagitis: Secondary | ICD-10-CM | POA: Diagnosis present

## 2023-05-04 DIAGNOSIS — R0602 Shortness of breath: Secondary | ICD-10-CM | POA: Diagnosis not present

## 2023-05-04 DIAGNOSIS — Z7982 Long term (current) use of aspirin: Secondary | ICD-10-CM | POA: Diagnosis not present

## 2023-05-04 DIAGNOSIS — J441 Chronic obstructive pulmonary disease with (acute) exacerbation: Principal | ICD-10-CM

## 2023-05-04 DIAGNOSIS — D573 Sickle-cell trait: Secondary | ICD-10-CM | POA: Diagnosis present

## 2023-05-04 DIAGNOSIS — Z880 Allergy status to penicillin: Secondary | ICD-10-CM | POA: Diagnosis not present

## 2023-05-04 DIAGNOSIS — J9601 Acute respiratory failure with hypoxia: Secondary | ICD-10-CM | POA: Diagnosis present

## 2023-05-04 DIAGNOSIS — Z96641 Presence of right artificial hip joint: Secondary | ICD-10-CM | POA: Diagnosis present

## 2023-05-04 DIAGNOSIS — I10 Essential (primary) hypertension: Secondary | ICD-10-CM | POA: Diagnosis present

## 2023-05-04 DIAGNOSIS — Z882 Allergy status to sulfonamides status: Secondary | ICD-10-CM | POA: Diagnosis not present

## 2023-05-04 DIAGNOSIS — J45901 Unspecified asthma with (acute) exacerbation: Secondary | ICD-10-CM | POA: Diagnosis present

## 2023-05-04 DIAGNOSIS — Z79899 Other long term (current) drug therapy: Secondary | ICD-10-CM | POA: Diagnosis not present

## 2023-05-04 DIAGNOSIS — B348 Other viral infections of unspecified site: Secondary | ICD-10-CM | POA: Diagnosis present

## 2023-05-04 DIAGNOSIS — T380X5A Adverse effect of glucocorticoids and synthetic analogues, initial encounter: Secondary | ICD-10-CM | POA: Diagnosis present

## 2023-05-04 DIAGNOSIS — D72829 Elevated white blood cell count, unspecified: Secondary | ICD-10-CM | POA: Diagnosis present

## 2023-05-04 DIAGNOSIS — Z7951 Long term (current) use of inhaled steroids: Secondary | ICD-10-CM | POA: Diagnosis not present

## 2023-05-04 DIAGNOSIS — R0902 Hypoxemia: Secondary | ICD-10-CM | POA: Diagnosis present

## 2023-05-04 DIAGNOSIS — Z881 Allergy status to other antibiotic agents status: Secondary | ICD-10-CM | POA: Diagnosis not present

## 2023-05-04 LAB — CBC
HCT: 34.8 % — ABNORMAL LOW (ref 36.0–46.0)
Hemoglobin: 11.7 g/dL — ABNORMAL LOW (ref 12.0–15.0)
MCH: 30.9 pg (ref 26.0–34.0)
MCHC: 33.6 g/dL (ref 30.0–36.0)
MCV: 91.8 fL (ref 80.0–100.0)
Platelets: 184 10*3/uL (ref 150–400)
RBC: 3.79 MIL/uL — ABNORMAL LOW (ref 3.87–5.11)
RDW: 13.1 % (ref 11.5–15.5)
WBC: 10.6 10*3/uL — ABNORMAL HIGH (ref 4.0–10.5)
nRBC: 0 % (ref 0.0–0.2)

## 2023-05-04 LAB — EXPECTORATED SPUTUM ASSESSMENT W GRAM STAIN, RFLX TO RESP C

## 2023-05-04 LAB — COMPREHENSIVE METABOLIC PANEL
ALT: 10 U/L (ref 0–44)
AST: 20 U/L (ref 15–41)
Albumin: 3.5 g/dL (ref 3.5–5.0)
Alkaline Phosphatase: 83 U/L (ref 38–126)
Anion gap: 12 (ref 5–15)
BUN: 18 mg/dL (ref 8–23)
CO2: 21 mmol/L — ABNORMAL LOW (ref 22–32)
Calcium: 8.8 mg/dL — ABNORMAL LOW (ref 8.9–10.3)
Chloride: 102 mmol/L (ref 98–111)
Creatinine, Ser: 1.16 mg/dL — ABNORMAL HIGH (ref 0.44–1.00)
GFR, Estimated: 46 mL/min — ABNORMAL LOW (ref >60)
Glucose, Bld: 164 mg/dL — ABNORMAL HIGH (ref 70–99)
Potassium: 3.9 mmol/L (ref 3.5–5.1)
Sodium: 135 mmol/L (ref 135–145)
Total Bilirubin: 0.4 mg/dL (ref 0.3–1.2)
Total Protein: 7.7 g/dL (ref 6.5–8.1)

## 2023-05-04 LAB — C-REACTIVE PROTEIN: CRP: 4.7 mg/dL — ABNORMAL HIGH (ref <1.0)

## 2023-05-04 LAB — PROCALCITONIN: Procalcitonin: 0.24 ng/mL

## 2023-05-04 LAB — BRAIN NATRIURETIC PEPTIDE: B Natriuretic Peptide: 78.5 pg/mL (ref 0.0–100.0)

## 2023-05-04 LAB — MAGNESIUM: Magnesium: 2.1 mg/dL (ref 1.7–2.4)

## 2023-05-04 MED ORDER — IPRATROPIUM-ALBUTEROL 0.5-2.5 (3) MG/3ML IN SOLN: 3.0000 mL | RESPIRATORY_TRACT | Status: DC

## 2023-05-04 MED ORDER — HYDRALAZINE HCL 20 MG/ML IJ SOLN
10.0000 mg | Freq: Four times a day (QID) | INTRAMUSCULAR | Status: DC | PRN
  Administered 2023-05-07: 10 mg via INTRAVENOUS
  Filled 2023-05-04: qty 1

## 2023-05-04 MED ORDER — IPRATROPIUM-ALBUTEROL 0.5-2.5 (3) MG/3ML IN SOLN: 3.0000 mL | Freq: Four times a day (QID) | RESPIRATORY_TRACT | Status: DC | PRN

## 2023-05-04 MED ORDER — IPRATROPIUM BROMIDE 0.02 % IN SOLN: 0.5000 mg | Freq: Four times a day (QID) | RESPIRATORY_TRACT | Status: DC | PRN

## 2023-05-04 MED ORDER — METHYLPREDNISOLONE SODIUM SUCC 40 MG IJ SOLR
40.0000 mg | Freq: Two times a day (BID) | INTRAMUSCULAR | Status: DC
  Administered 2023-05-04 – 2023-05-07 (×6): 40 mg via INTRAVENOUS
  Filled 2023-05-04 (×6): qty 1

## 2023-05-04 MED ORDER — METHYLPREDNISOLONE SODIUM SUCC 40 MG IJ SOLR
40.0000 mg | Freq: Once | INTRAMUSCULAR | Status: AC
  Administered 2023-05-04: 40 mg via INTRAVENOUS
  Filled 2023-05-04: qty 1

## 2023-05-04 MED ORDER — LORATADINE 10 MG PO TABS
10.0000 mg | ORAL_TABLET | Freq: Every day | ORAL | Status: DC
  Administered 2023-05-04 – 2023-05-11 (×8): 10 mg via ORAL
  Filled 2023-05-04 (×8): qty 1

## 2023-05-04 MED ORDER — ALBUTEROL SULFATE HFA 108 (90 BASE) MCG/ACT IN AERS
2.0000 | INHALATION_SPRAY | Freq: Four times a day (QID) | RESPIRATORY_TRACT | Status: DC | PRN
  Administered 2023-05-04 – 2023-05-07 (×2): 2 via RESPIRATORY_TRACT
  Filled 2023-05-04: qty 6.7

## 2023-05-04 MED ORDER — IPRATROPIUM-ALBUTEROL 0.5-2.5 (3) MG/3ML IN SOLN
3.0000 mL | RESPIRATORY_TRACT | Status: DC
  Administered 2023-05-04 – 2023-05-11 (×40): 3 mL via RESPIRATORY_TRACT
  Filled 2023-05-04 (×42): qty 3

## 2023-05-04 MED ORDER — METHYLPREDNISOLONE SODIUM SUCC 125 MG IJ SOLR: 60.0000 mg | Freq: Two times a day (BID) | INTRAMUSCULAR | Status: DC

## 2023-05-04 MED ORDER — PANTOPRAZOLE SODIUM 40 MG PO TBEC
40.0000 mg | DELAYED_RELEASE_TABLET | Freq: Every day | ORAL | Status: DC
  Administered 2023-05-04 – 2023-05-11 (×8): 40 mg via ORAL
  Filled 2023-05-04 (×9): qty 1

## 2023-05-04 MED ORDER — POTASSIUM CHLORIDE CRYS ER 20 MEQ PO TBCR
20.0000 meq | EXTENDED_RELEASE_TABLET | Freq: Once | ORAL | Status: AC
  Administered 2023-05-04: 20 meq via ORAL
  Filled 2023-05-04: qty 1

## 2023-05-04 NOTE — Consult Note (Signed)
NAME:  Mary Chase, MRN:  [id number], DOB:  July 01, 1935, LOS: 0 ADMISSION DATE:  05/03/2023, CONSULTATION DATE:  5/14 REFERRING MD:  Dr. Thedore Mins, CHIEF COMPLAINT:  sob   History of Present Illness:  Patient is a 88 year old female with pertinent PMH asthma, COPD, HTN, GERD, sickle cell trait presents to [facility] ED on 5/13 with SOB.  Patient states they have been having increased WOB since 5/11 with wheezing.  Patient tried home albuterol inhalers without improvement.  On 5/13 patient with increased WOB and also having symptoms of lightheadedness and dizzy.  Upon EMS arrival found to be hypoxic 88% on room air.  Patient given breathing treatments, steroids, mag and transported to [facility] ED.  Upon arrival, RRR low 20s and tachycardia 100-120s.  Patient initially on 4 LNC but then required BiPAP for WOB.  CXR with no acute abnormality.  WBC 11.7 and afebrile.  Troponin and BNP negative.  Patient given more breathing treatments and started on steroids.  RVP showing parainfluenza positive.  Patient admitted to progressive.  On 5/14, patient wore bipap throughout the night. Still hypoxic requiring 5L [place]. PCCM consulted.  Pertinent  Medical History   Past Medical History:  Diagnosis Date   Arthritis    Asthma    Cataract    COPD (chronic obstructive pulmonary disease) (HCC)    Dyspnea    with exertion   GERD (gastroesophageal reflux disease)    Headache    Hypertension    Pneumonia    Pre-diabetes    A1c within normal limits last check   Sickle cell anemia (HCC)    trait   Sickle cell trait (HCC)      Significant Hospital Events: Including procedures, antibiotic start and stop dates in addition to other pertinent events   5/13 admitted w/ asthma/copd exacerbation; admitted to progressive on bipap 5/14 pccm consulted   Interim History / Subjective:  Up in bed eating breakfast Coughing up thick secretions On 4L [place] sats 95% Able to talk and states breathing is better from yesterday BS  with b/l wheezing  Objective   Blood pressure (!) 144/91, pulse (!) 111, temperature 98.5 F (36.9 C), temperature source Oral, resp. rate 19, height 5' (1.524 m), weight 72.1 kg, SpO2 93 %.    FiO2 (%):  [32 %-50 %] 32 % Pressure Support:  [6 cmH20] 6 cmH20  No intake or output data in the 24 hours ending 05/04/23 1122 Filed Weights   05/03/23 1853  Weight: 72.1 kg    Examination: General:  elderly female w/ mild respiratory distress HEENT: MM pink/moist; [place] in place Neuro: Aox3; MAE CV: s1s2, tachy 110s, no m/r/g PULM:  dim wheezing BS bilaterally; [place] 4L GI: soft, bsx4 active  Extremities: warm/dry, no edema  Skin: no rashes or lesions    Resolved Hospital Problem list     Assessment & Plan:  Asthma/COPD exacerbation: on wixela at home Acute respiratory failure w/ hypoxia Parainfluenza virus P: -wean  for sats >92% -bipap prn for wob -duoneb scheduled - cont dulera; may need to change to pulmicort/brovana nebulizers if unable to use inhaler effectively -cont iv steroids -Pulmonary toiletry: IS/Flutter -OOB/PT/OT -given mildly elevated PCT will check expectorated sputum; consider cap ppx  Best Practice (right click and "Reselect all SmartList Selections" daily)   Per primary  Labs   CBC: Recent Labs  Lab 05/03/23 1255 05/04/23 0611  WBC 11.7* 10.6*  HGB 12.1 11.7*  HCT 36.1 34.8*  MCV 94.0 91.8  PLT 192 184  Basic Metabolic Panel: Recent Labs  Lab 05/03/23 1255 05/04/23 0611  NA 138 135  K 3.3* 3.9  CL 104 102  CO2 23 21*  GLUCOSE 185* 164*  BUN 7* 18  CREATININE 0.97 1.16*  CALCIUM 8.7* 8.8*  MG  --  2.1   GFR: Estimated Creatinine Clearance: 31.4 mL/min (A) (by C-G formula based on SCr of 1.16 mg/dL (H)). Recent Labs  Lab 05/03/23 1255 05/04/23 0611  PROCALCITON  --  0.24  WBC 11.7* 10.6*    Liver Function Tests: Recent Labs  Lab 05/04/23 0611  AST 20  ALT 10  ALKPHOS 83  BILITOT 0.4  PROT 7.7  ALBUMIN 3.5   No  results for input(s): "LIPASE", "AMYLASE" in the last 168 hours. No results for input(s): "AMMONIA" in the last 168 hours.  ABG No results found for: "PHART", "PCO2ART", "PO2ART", "HCO3", "TCO2", "ACIDBASEDEF", "O2SAT"   Coagulation Profile: No results for input(s): "INR", "PROTIME" in the last 168 hours.  Cardiac Enzymes: No results for input(s): "CKTOTAL", "CKMB", "CKMBINDEX", "TROPONINI" in the last 168 hours.  HbA1C: Hgb A1c MFr Bld  Date/Time Value Ref Range Status  09/10/2020 02:13 PM 5.4 4.8 - 5.6 % Final    Comment:    (NOTE) Pre diabetes:          5.7%-6.4%  Diabetes:              >6.4%  Glycemic control for   <7.0% adults with diabetes     CBG: No results for input(s): "GLUCAP" in the last 168 hours.  Review of Systems:   Review of Systems  Constitutional:  Negative for fever.  Respiratory:  Positive for cough, sputum production, shortness of breath and wheezing.   Cardiovascular:  Negative for chest pain.  Gastrointestinal:  Negative for abdominal pain, diarrhea, nausea and vomiting.     Past Medical History:  She,  has a past medical history of Arthritis, Asthma, Cataract, COPD (chronic obstructive pulmonary disease) (HCC), Dyspnea, GERD (gastroesophageal reflux disease), Headache, Hypertension, Pneumonia, Pre-diabetes, Sickle cell anemia (HCC), and Sickle cell trait (HCC).   Surgical History:   Past Surgical History:  Procedure Laterality Date   ANKLE SURGERY Right 02   pin   BREAST BIOPSY Right 11/01/2013   Procedure: RIGHT BREAST MASS EXCISION;  Surgeon: Emelia Loron, MD;  Location: [facility] OR;  Service: General;  Laterality: Right;   ELBOW SURGERY Right    pin   MULTIPLE TOOTH EXTRACTIONS     TOTAL HIP ARTHROPLASTY Right 09/16/2020   Procedure: RIGHT TOTAL HIP ARTHROPLASTY ANTERIOR APPROACH;  Surgeon: Gean Birchwood, MD;  Location: [facility] ORS;  Service: Orthopedics;  Laterality: Right;   TUBAL LIGATION       Social History:   reports that she has  never smoked. She has never used smokeless tobacco. She reports current alcohol use. She reports that she does not use drugs.   Family History:  Her family history is not on file.   Allergies Allergies  Allergen Reactions   Penicillins Shortness Of Breath and Rash   Keflex [Cephalexin] Itching   Sulfa Antibiotics Itching and Other (See Comments)    Swelling in mouth   Other Itching    Some beans      Home Medications  Prior to Admission medications   Medication Sig Start Date End Date Taking? Authorizing Provider  acetaminophen (TYLENOL) 500 MG tablet Take 1,000 mg by mouth daily.   Yes [provider]  albuterol (VENTOLIN HFA) 108 (90 Base) MCG/ACT inhaler  INHALE 2 PUFFS INTO THE LUNGS EVERY 6 HOURS AS NEEDED FOR WHEEZE 04/30/23  Yes Alfonse Spruce, MD  alendronate (FOSAMAX) 70 MG tablet Take 70 mg by mouth once a week. 04/29/23  Yes [provider]  amLODipine (NORVASC) 10 MG tablet Take 10 mg by mouth daily. 11/23/22  Yes [provider]  aspirin EC 81 MG tablet Take 1 tablet (81 mg total) by mouth 2 (two) times daily. 09/16/20  Yes Allena Katz, PA-C  Cholecalciferol (VITAMIN D3) 25 MCG (1000 UT) CAPS Take 1,000 Units by mouth daily in the afternoon.   Yes [provider]  Cyanocobalamin (VITAMIN B-12) 5000 MCG TBDP Take 5,000 mcg by mouth daily.   Yes [provider]  fluticasone (FLONASE) 50 MCG/ACT nasal spray PLACE 1 SPRAY INTO BOTH NOSTRILS DAILY AS NEEDED FOR ALLERGIES OR RHINITIS. 04/21/23  Yes Patel, Priya P, MD  fluticasone-salmeterol (WIXELA INHUB) 250-50 MCG/ACT AEPB Inhale 1 puff into the lungs in the morning and at bedtime. 09/10/22  Yes Alfonse Spruce, MD  lisinopril (ZESTRIL) 40 MG tablet Take 40 mg by mouth daily. 09/11/19  Yes [provider]  montelukast (SINGULAIR) 10 MG tablet Take 1 tablet (10 mg total) by mouth daily. 09/10/22  Yes Alfonse Spruce, MD          JD Anselm Lis [place]  Pulmonary & Critical Care 05/04/2023, 11:22 AM  Please see [email or web address] for pager details.  From 7A-7P if no response, please call [phone]. After hours, please call ELink [phone].

## 2023-05-04 NOTE — Progress Notes (Signed)
PROGRESS NOTE                                                                                                                                                                                                             Patient Demographics:    Mary Chase, is a 88 y.o. female, DOB - December 02, 1935, ZOX:[id number]  Outpatient Primary MD for the patient is Joycelyn Rua, MD    LOS - 0  Admit date - 05/03/2023    Chief Complaint  Patient presents with   Shortness of Breath       Brief Narrative (HPI from H&P)    88 y.o. female with medical history significant of asthma, COPD, GERD, hypertension, sickle cell trait presenting with of shortness of breath, present at rest worse with exertion, no orthopnea, no fever chills or sick contacts.  She has history of asthma and 1-2 2 times a year it acts up quite bad according to the patient.  She was brought from home by EMS upon arrival to the ED she was found to be severely hypoxic and placed on BiPAP.  She was admitted for acute hypoxic respiratory failure, asthma exacerbation caused by parainfluenza virus infection.   Subjective:    Mary Chase today has, No headache, No chest pain, No abdominal pain - No Nausea, No new weakness tingling or numbness, +++ SOB   Assessment  & Plan :   Acute hypoxic respiratory failure due to severe asthma exacerbation caused by parainfluenza viral infection. she has extremely poor movement bilaterally with loud wheezing, she has received oral prednisone this morning but I will switch her back to IV steroids, nebulizer treatments and inhaled steroids along with H2 blocker added.  She was on BiPAP overnight currently on 5 L nasal cannula oxygen but still has labored breathing although she states she is feeling better, will request pulmonary to also monitor closely.  Chest x-ray stable.  No orthopnea, stable BNP.  Essential hypertension.  For now Norvasc  and hydralazine.  Holding ACE inhibitor due to wheezing and underlying cough.  GERD.  Placed on PPI.  History of sickle cell trait.  Monitor.      Condition - Extremely Guarded  Family Communication  :  daughter bedside 05/04/23  Code Status :  Full  Consults  :  PCCM  PUD Prophylaxis : PPI  Procedures  :            Disposition Plan  :    Status is: Observation  DVT Prophylaxis  :    enoxaparin (LOVENOX) injection 40 mg Start: 05/03/23 1500    Lab Results  Component Value Date   PLT 184 05/04/2023    Diet :  Diet Order             Diet regular Fluid consistency: Thin  Diet effective now                    Inpatient Medications  Scheduled Meds:  amLODipine  10 mg Oral Daily   enoxaparin (LOVENOX) injection  40 mg Subcutaneous Q24H   ipratropium  0.5 mg Nebulization Q6H   loratadine  10 mg Oral Daily   methylPREDNISolone (SOLU-MEDROL) injection  60 mg Intravenous Q12H   methylPREDNISolone (SOLU-MEDROL) injection  40 mg Intravenous Once   mometasone-formoterol  2 puff Inhalation BID   montelukast  10 mg Oral Daily   pantoprazole  40 mg Oral Daily   sodium chloride flush  3 mL Intravenous Q12H   Continuous Infusions: PRN Meds:.acetaminophen **OR** acetaminophen, albuterol, albuterol, hydrALAZINE, polyethylene glycol  Antibiotics  :    Anti-infectives (From admission, onward)    None         Objective:   Vitals:   05/04/23 0830 05/04/23 0831 05/04/23 0832 05/04/23 0833  BP:    (!) 144/91  Pulse: (!) 111 (!) 112 (!) 112 (!) 111  Resp: (!) 22 20 20 19   Temp:    98.5 F (36.9 C)  TempSrc:    Oral  SpO2: 90% 90% 92% 93%  Weight:      Height:        Wt Readings from Last 3 Encounters:  05/03/23 72.1 kg  11/26/22 73 kg  11/18/22 74.8 kg    No intake or output data in the 24 hours ending 05/04/23 1117   Physical Exam  Awake Alert, No new F.N deficits, Normal affect [place].AT,PERRAL Supple Neck, No JVD,   Symmetrical Chest wall  movement, poor air movement bilaterally, +++ wheezing RRR,No Gallops,Rubs or new Murmurs,  +ve B.Sounds, Abd Soft, No tenderness,   No Cyanosis, Clubbing or edema       Data Review:    Recent Labs  Lab 05/03/23 1255 05/04/23 0611  WBC 11.7* 10.6*  HGB 12.1 11.7*  HCT 36.1 34.8*  PLT 192 184  MCV 94.0 91.8  MCH 31.5 30.9  MCHC 33.5 33.6  RDW 13.1 13.1    Recent Labs  Lab 05/03/23 1255 05/04/23 0611  NA 138 135  K 3.3* 3.9  CL 104 102  CO2 23 21*  ANIONGAP 11 12  GLUCOSE 185* 164*  BUN 7* 18  CREATININE 0.97 1.16*  AST  --  20  ALT  --  10  ALKPHOS  --  83  BILITOT  --  0.4  ALBUMIN  --  3.5  CRP  --  4.7*  PROCALCITON  --  0.24  BNP 43.6 78.5  MG  --  2.1  CALCIUM 8.7* 8.8*      Recent Labs  Lab 05/03/23 1255 05/04/23 0611  CRP  --  4.7*  PROCALCITON  --  0.24  BNP 43.6 78.5  MG  --  2.1  CALCIUM 8.7* 8.8*    Recent Labs  Lab 05/03/23 1255 05/04/23 0611  WBC 11.7* 10.6*  PLT 192 184  CRP  --  4.7*  PROCALCITON  --  0.24  CREATININE 0.97 1.16*    ------------------------------------------------------------------------------------------------------------------ No results found for: "CHOL", "HDL", "LDLCALC", "LDLDIRECT", "TRIG", "CHOLHDL"  Lab Results  Component Value Date   HGBA1C 5.4 09/10/2020    No results for input(s): "TSH", "T4TOTAL", "T3FREE", "THYROIDAB" in the last 72 hours.  Invalid input(s): "FREET3" ------------------------------------------------------------------------------------------------------------------ Cardiac Enzymes No results for input(s): "CKMB", "TROPONINI", "MYOGLOBIN" in the last 168 hours.  Invalid input(s): "CK"  Micro Results Recent Results (from the past 240 hour(s))  Respiratory (~20 pathogens) panel by PCR     Status: Abnormal   Collection Time: 05/03/23 10:06 PM   Specimen: Nasopharyngeal Swab; Respiratory  Result Value Ref Range Status   Adenovirus NOT DETECTED NOT DETECTED Final    Coronavirus 229E NOT DETECTED NOT DETECTED Final    Comment: (NOTE) The Coronavirus on the Respiratory Panel, DOES NOT test for the novel  Coronavirus (2019 nCoV)    Coronavirus HKU1 NOT DETECTED NOT DETECTED Final   Coronavirus NL63 NOT DETECTED NOT DETECTED Final   Coronavirus OC43 NOT DETECTED NOT DETECTED Final   Metapneumovirus NOT DETECTED NOT DETECTED Final   Rhinovirus / Enterovirus NOT DETECTED NOT DETECTED Final   Influenza A NOT DETECTED NOT DETECTED Final   Influenza B NOT DETECTED NOT DETECTED Final   Parainfluenza Virus 1 NOT DETECTED NOT DETECTED Final   Parainfluenza Virus 2 NOT DETECTED NOT DETECTED Final   Parainfluenza Virus 3 DETECTED (A) NOT DETECTED Final   Parainfluenza Virus 4 NOT DETECTED NOT DETECTED Final   Respiratory Syncytial Virus NOT DETECTED NOT DETECTED Final   Bordetella pertussis NOT DETECTED NOT DETECTED Final   Bordetella Parapertussis NOT DETECTED NOT DETECTED Final   Chlamydophila pneumoniae NOT DETECTED NOT DETECTED Final   Mycoplasma pneumoniae NOT DETECTED NOT DETECTED Final    Comment: Performed at [facility], [address], [place], [place] [address]    Radiology Reports DG Chest [place] 1 View  Result Date: 05/04/2023 CLINICAL DATA:  88 year old female with shortness of breath. EXAM: PORTABLE CHEST 1 VIEW COMPARISON:  Portable chest 05/03/2023 and earlier. FINDINGS: Portable AP upright view at 0645 hours. Stable lung volumes and mediastinal contours, within normal limits. Stable ventilation and Allowing for portable technique the lungs are clear. No pneumothorax or convincing pleural effusion. Chronic severe bilateral glenohumeral degeneration. Negative visible bowel gas. IMPRESSION: No acute cardiopulmonary abnormality. Electronically Signed   By: Odessa Fleming M.D.   On: 05/04/2023 07:11   DG Chest Portable 1 View  Result Date: 05/03/2023 CLINICAL DATA:  Shortness of breath EXAM: PORTABLE CHEST 1 VIEW COMPARISON:  08/28/2022 FINDINGS: No  focal consolidation. No pleural effusion or pneumothorax. Heart and mediastinal contours are unremarkable. No acute osseous abnormality. Severe osteoarthritis of bilateral glenohumeral joints. IMPRESSION: 1. No acute cardiopulmonary disease. Electronically Signed   By: Elige Ko M.D.   On: 05/03/2023 13:05      Signature  -   Susa Raring M.D on 05/04/2023 at 11:17 AM   -  To page go to [email or web address]

## 2023-05-04 NOTE — Evaluation (Signed)
Occupational Therapy Evaluation Patient Details Name: KERIN TREMONTI MRN: [id number] DOB: [birth year] Today's Date: 05/04/2023   History of Present Illness Chandelle Dinga Maura is a 88 y.o. female with medical history significant of asthma, COPD, GERD, hypertension, sickle cell trait presenting with shortness of breath.   Clinical Impression   Pt reports PTA, she was independent with ADL/IADL and functional mobility with use of rollator. Pt currently requires minguard assistance for sit<>stand from EOB. She declined further mobility this date 2/2 sob and wob. Pt required minA for LB dressing due to cardiopulmonary limitations. Pt on  3lnc throughout session, SpO2>92% at rest, 88% with exertion. Pt's HR 115-120bpm at rest, 130bpm with exertion. Anticipate pt to d/c home with support from family and no follow-up OT services. Will continue to follow acutely.      Recommendations for follow up therapy are one component of a multi-disciplinary discharge planning process, led by the attending physician.  Recommendations may be updated based on patient status, additional functional criteria and insurance authorization.   Assistance Recommended at Discharge Intermittent Supervision/Assistance  Patient can return home with the following A little help with walking and/or transfers;A little help with bathing/dressing/bathroom;Assistance with cooking/housework    Functional Status Assessment  Patient has had a recent decline in their functional status and demonstrates the ability to make significant improvements in function in a reasonable and predictable amount of time.  Equipment Recommendations  None recommended by OT    Recommendations for Other Services       Precautions / Restrictions Precautions Precautions: Fall      Mobility Bed Mobility Overal bed mobility: Modified Independent             General bed mobility comments: hob elevated, pt with increased time    Transfers Overall  transfer level: Needs assistance   Transfers: Sit to/from Stand Sit to Stand: Min guard           General transfer comment: minguard for safety and line management (pt declined further mobility 2/2 sob and wob)      Balance Overall balance assessment: Modified Independent                                         ADL either performed or assessed with clinical judgement   ADL Overall ADL's : Needs assistance/impaired Eating/Feeding: Set up   Grooming: Set up;Sitting   Upper Body Bathing: Set up;Sitting   Lower Body Bathing: Minimal assistance;Sitting/lateral leans   Upper Body Dressing : Set up;Sitting   Lower Body Dressing: Minimal assistance;Sit to/from stand Lower Body Dressing Details (indicate cue type and reason): pt requesting assistance with donning/doffing socks 2/2 increased strain and increased wob/sob with attempts to complete independently Toilet Transfer: Min Pension scheme manager Details (indicate cue type and reason): sit to stand from EOB Toileting- Architect and Hygiene: Min guard;Sit to/from stand         General ADL Comments: pt declined mobility 2/2 wob and sob, HR 120s at rest, spO2 >92% on 3lnc     Vision         Perception     Praxis      Pertinent Vitals/Pain Pain Assessment Pain Assessment: 0-10 Pain Score: 10-Worst pain ever Pain Location: chest pain "hurts to breath" Pain Descriptors / Indicators: Tightness Pain Intervention(s): Limited activity within patient's tolerance, Monitored during session     Hand Dominance Right  Extremity/Trunk Assessment Upper Extremity Assessment Upper Extremity Assessment: Overall WFL for tasks assessed   Lower Extremity Assessment Lower Extremity Assessment: Defer to PT evaluation   Cervical / Trunk Assessment Cervical / Trunk Assessment: Normal   Communication Communication Communication: No difficulties   Cognition Arousal/Alertness: Awake/alert Behavior  During Therapy: WFL for tasks assessed/performed Overall Cognitive Status: Within Functional Limits for tasks assessed                                       General Comments  HR 120s at rest up to 130bpm with standing, SpO2 88% with exertion, >92% at rest on 3lnc    Exercises     Shoulder Instructions      Home Living Family/patient expects to be discharged to:: Private residence Living Arrangements: Children Available Help at Discharge: Family;Available PRN/intermittently Type of Home: House Home Access: Stairs to enter [employer] of Steps: 4 Entrance Stairs-Rails: Right;Left Home Layout: One level     Bathroom Shower/Tub: Chief Strategy Officer: Standard Bathroom Accessibility: Yes How Accessible: Accessible via walker Home Equipment: Rolling Walker (2 wheels);Wheelchair Financial trader (4 wheels);Cane - single point;Tub bench          Prior Functioning/Environment Prior Level of Function : Needs assist             Mobility Comments: pt was using a rollator in and out of the home ADLs Comments: pt was having assistance with IADL, some assistance with bathing,        OT Problem List: Decreased activity tolerance;Cardiopulmonary status limiting activity      OT Treatment/Interventions: Self-care/ADL training;Energy conservation;DME and/or AE instruction;Patient/family education;Balance training    OT Goals(Current goals can be found in the care plan section) Acute Rehab OT Goals Patient Stated Goal: to get her rescue inhaler OT Goal Formulation: With patient Time For Goal Achievement: 05/18/23 Potential to Achieve Goals: Good ADL Goals Pt Will Perform Lower Body Dressing: with modified independence;sit to/from stand Pt Will Transfer to Toilet: with modified independence;ambulating Additional ADL Goal #1: Pt will demonstrate independence with 3 energy conservation strategies during ALD/IADL.  OT Frequency: Min  2X/week    Co-evaluation PT/OT/SLP Co-Evaluation/Treatment: Yes Reason for Co-Treatment: For patient/therapist safety;To address functional/ADL transfers   OT goals addressed during session: ADL's and self-care      AM-PAC OT "6 Clicks" Daily Activity     Outcome Measure Help from another person eating meals?: A Little Help from another person taking care of personal grooming?: A Little Help from another person toileting, which includes using toliet, bedpan, or urinal?: A Little Help from another person bathing (including washing, rinsing, drying)?: A Little Help from another person to put on and taking off regular upper body clothing?: A Little Help from another person to put on and taking off regular lower body clothing?: A Little 6 Click Score: 18   End of Session Equipment Utilized During Treatment: Oxygen Nurse Communication: Mobility status  Activity Tolerance: Patient tolerated treatment well Patient left: in bed;with call bell/phone within reach;with bed alarm set  OT Visit Diagnosis: Other abnormalities of gait and mobility (R26.89)                Time: 1308-6578 OT Time Calculation (min): 30 min Charges:  OT General Charges $OT Visit: 1 Visit OT Evaluation $OT Eval Moderate Complexity: 1 Mod  Rosalia Mcavoy OTR/L Acute Rehabilitation Services Office: [phone]   Rosey Bath  Mikki Santee 05/04/2023, 10:44 AM

## 2023-05-04 NOTE — Evaluation (Signed)
Physical Therapy Evaluation Patient Details Name: Mary Chase MRN: [id number] DOB: 06-23-1935 Today's Date: 05/04/2023  History of Present Illness  Mary Chase is a 88 y.o. female presenting to [facility] on 5/13 for shortness of breath. Chest x-ray stable. PMHx: asthma, COPD, GERD, hypertension, sickle cell trait.  Clinical Impression  Pt presents today with impaired activity tolerance and endurance. Pt reports ambulating around her home and in the community with a rollator, typically on room air, but requiring 4L O2 [place] today. Pt reports she gets SOB at home, however pt very fatigued, requiring increased time to perform bed mobility and sit<>stand transfer, declining any further mobility as she reports it takes too long to recover. Pt's HR between 120-130 with mobility and SPO2 between 88-92% on 4L O2 [place]. Pt able to converse with increased time during today's session. Pt will continue to benefit from skilled acute PT to progress activity tolerance with mobility, currently recommend HHPT upon discharge to progress pt's endurance back to baseline. Will continue to follow as appropriate.        Recommendations for follow up therapy are one component of a multi-disciplinary discharge planning process, led by the attending physician.  Recommendations may be updated based on patient status, additional functional criteria and insurance authorization.  Follow Up Recommendations       Assistance Recommended at Discharge Intermittent Supervision/Assistance  Patient can return home with the following  A little help with walking and/or transfers;Help with stairs or ramp for entrance;Assist for transportation;Assistance with cooking/housework    Equipment Recommendations None recommended by PT  Recommendations for Other Services       Functional Status Assessment Patient has had a recent decline in their functional status and demonstrates the ability to make significant improvements in function in a  reasonable and predictable amount of time.     Precautions / Restrictions Precautions Precautions: Fall;Other (comment) Precaution Comments: watch HR and O2 Restrictions Weight Bearing Restrictions: No      Mobility  Bed Mobility Overal bed mobility: Modified Independent             General bed mobility comments: HOB significantly elevated with use of bed rail, no physical assist required    Transfers Overall transfer level: Needs assistance Equipment used: None Transfers: Sit to/from Stand Sit to Stand: Min guard           General transfer comment: minG for safety and line management. Pt declined efforts of transfer to recliner    Ambulation/Gait               General Gait Details: pt declining any attempts at ambulation due to feeling SOB, sats 88-92% throughout  Stairs            Wheelchair Mobility    Modified Rankin (Stroke Patients Only)       Balance Overall balance assessment: Modified Independent                                           Pertinent Vitals/Pain Pain Assessment Pain Assessment: 0-10 Pain Score: 10-Worst pain ever Pain Location: chest pain Pain Descriptors / Indicators: Tightness Pain Intervention(s): Limited activity within patient's tolerance, Monitored during session, Repositioned    Home Living Family/patient expects to be discharged to:: Private residence Living Arrangements: Children Available Help at Discharge: Family;Available PRN/intermittently Type of Home: House Home Access: Stairs to enter Entrance Stairs-Rails: Right;Left  Entrance Stairs-Number of Steps: 4   Home Layout: One level Home Equipment: Agricultural consultant (2 wheels);Wheelchair Financial trader (4 wheels);Cane - single point;Tub bench      Prior Function Prior Level of Function : Needs assist             Mobility Comments: utilizing rollator for all mobility, has wheelchair but reports she hasn't had to use it yet        Hand Dominance   Dominant Hand: Right    Extremity/Trunk Assessment   Upper Extremity Assessment Upper Extremity Assessment: Defer to OT evaluation    Lower Extremity Assessment Lower Extremity Assessment: Overall WFL for tasks assessed    Cervical / Trunk Assessment Cervical / Trunk Assessment: Normal  Communication   Communication: No difficulties  Cognition Arousal/Alertness: Awake/alert Behavior During Therapy: WFL for tasks assessed/performed Overall Cognitive Status: Within Functional Limits for tasks assessed                                 General Comments: A&Ox4, family present at bedside        General Comments General comments (skin integrity, edema, etc.): HR 120s while seated EOB and talking, increasing to low 130s with standing, recovering with seated rest break. SPO2 88-92% on 4L O2 [place], pt talking throughout    Exercises     Assessment/Plan    PT Assessment Patient needs continued PT services  PT Problem List Decreased activity tolerance;Decreased balance;Decreased mobility;Cardiopulmonary status limiting activity       PT Treatment Interventions DME instruction;Gait training;Stair training;Functional mobility training;Therapeutic activities;Therapeutic exercise;Balance training;Neuromuscular re-education;Patient/family education    PT Goals (Current goals can be found in the Care Plan section)  Acute Rehab PT Goals Patient Stated Goal: get better and go home PT Goal Formulation: With patient Time For Goal Achievement: 05/18/23 Potential to Achieve Goals: Good    Frequency Min 3X/week     Co-evaluation PT/OT/SLP Co-Evaluation/Treatment: Yes Reason for Co-Treatment: For patient/therapist safety;To address functional/ADL transfers PT goals addressed during session: Mobility/safety with mobility;Balance         AM-PAC PT "6 Clicks" Mobility  Outcome Measure Help needed turning from your back to your side while in a flat  bed without using bedrails?: None Help needed moving from lying on your back to sitting on the side of a flat bed without using bedrails?: None Help needed moving to and from a bed to a chair (including a wheelchair)?: A Little Help needed standing up from a chair using your arms (e.g., wheelchair or bedside chair)?: A Little Help needed to walk in hospital room?: A Lot Help needed climbing 3-5 steps with a railing? : A Lot 6 Click Score: 18    End of Session Equipment Utilized During Treatment: Oxygen Activity Tolerance: Patient limited by fatigue Patient left: in bed;with call bell/phone within reach;with family/visitor present Nurse Communication: Mobility status PT Visit Diagnosis: Other abnormalities of gait and mobility (R26.89);Difficulty in walking, not elsewhere classified (R26.2)    Time: 1610-9604 PT Time Calculation (min) (ACUTE ONLY): 29 min   Charges:   PT Evaluation $PT Eval Moderate Complexity: 1 Mod          Lindalou Hose, PT DPT Acute Rehabilitation Services Office [phone]   Leonie Man 05/04/2023, 3:03 PM

## 2023-05-05 DIAGNOSIS — I10 Essential (primary) hypertension: Secondary | ICD-10-CM | POA: Diagnosis not present

## 2023-05-05 DIAGNOSIS — J9601 Acute respiratory failure with hypoxia: Secondary | ICD-10-CM | POA: Diagnosis not present

## 2023-05-05 LAB — CBC WITH DIFFERENTIAL/PLATELET
Abs Immature Granulocytes: 0.07 10*3/uL (ref 0.00–0.07)
Basophils Absolute: 0 10*3/uL (ref 0.0–0.1)
Basophils Relative: 0 %
Eosinophils Absolute: 0 10*3/uL (ref 0.0–0.5)
Eosinophils Relative: 0 %
HCT: 36 % (ref 36.0–46.0)
Hemoglobin: 12 g/dL (ref 12.0–15.0)
Immature Granulocytes: 1 %
Lymphocytes Relative: 11 %
Lymphs Abs: 1.6 10*3/uL (ref 0.7–4.0)
MCH: 30.8 pg (ref 26.0–34.0)
MCHC: 33.3 g/dL (ref 30.0–36.0)
MCV: 92.5 fL (ref 80.0–100.0)
Monocytes Absolute: 0.5 10*3/uL (ref 0.1–1.0)
Monocytes Relative: 4 %
Neutro Abs: 11.7 10*3/uL — ABNORMAL HIGH (ref 1.7–7.7)
Neutrophils Relative %: 84 %
Platelets: 214 10*3/uL (ref 150–400)
RBC: 3.89 MIL/uL (ref 3.87–5.11)
RDW: 13.1 % (ref 11.5–15.5)
WBC: 13.9 10*3/uL — ABNORMAL HIGH (ref 4.0–10.5)
nRBC: 0 % (ref 0.0–0.2)

## 2023-05-05 LAB — MAGNESIUM: Magnesium: 2.2 mg/dL (ref 1.7–2.4)

## 2023-05-05 LAB — PROCALCITONIN: Procalcitonin: 0.14 ng/mL

## 2023-05-05 LAB — BRAIN NATRIURETIC PEPTIDE: B Natriuretic Peptide: 70.9 pg/mL (ref 0.0–100.0)

## 2023-05-05 LAB — BASIC METABOLIC PANEL
Anion gap: 9 (ref 5–15)
BUN: 21 mg/dL (ref 8–23)
CO2: 25 mmol/L (ref 22–32)
Calcium: 9.1 mg/dL (ref 8.9–10.3)
Chloride: 101 mmol/L (ref 98–111)
Creatinine, Ser: 1.15 mg/dL — ABNORMAL HIGH (ref 0.44–1.00)
GFR, Estimated: 47 mL/min — ABNORMAL LOW (ref >60)
Glucose, Bld: 150 mg/dL — ABNORMAL HIGH (ref 70–99)
Potassium: 4.4 mmol/L (ref 3.5–5.1)
Sodium: 135 mmol/L (ref 135–145)

## 2023-05-05 LAB — CULTURE, RESPIRATORY W GRAM STAIN

## 2023-05-05 LAB — C-REACTIVE PROTEIN: CRP: 1.7 mg/dL — ABNORMAL HIGH (ref <1.0)

## 2023-05-05 NOTE — Progress Notes (Signed)
PROGRESS NOTE                                                                                                                                                                                                             Patient Demographics:    Mary Chase, is a 88 y.o. female, DOB - [birth year], ZOX:[id number]  Outpatient Primary MD for the patient is Joycelyn Rua, MD    LOS - 1  Admit date - 05/03/2023    Chief Complaint  Patient presents with   Shortness of Breath       Brief Narrative (HPI from H&P)     88 y.o. female with medical history significant of asthma, COPD, GERD, hypertension, sickle cell trait presenting with of shortness of breath, present at rest worse with exertion, no orthopnea, no fever chills or sick contacts.  She has history of asthma and 1-2 2 times a year it acts up quite bad according to the patient.  She was brought from home by EMS upon arrival to the ED she was found to be severely hypoxic and placed on BiPAP.  She was admitted for acute hypoxic respiratory failure, asthma exacerbation caused by parainfluenza virus infection.   Subjective:    Mary Chase today reported cough, congestion, and shortness of breath.       Assessment  & Plan :   Acute hypoxic respiratory failure due to severe asthma exacerbation caused by parainfluenza viral infection.  - she has extremely poor movement bilaterally with loud wheezing, she has received oral prednisone this morning but I will switch her back to IV steroids, nebulizer treatments and inhaled steroids along with H2 blocker added.   -Required BiPAP initially, currently no BiPAP requirement over last 24 hours - -has nasal cannula overnight, was able to wean 3 to 4 L oxygen. -IV steroids, continue with DuoNebs, Dulera, Singulair and Claritin.  Essential hypertension.  For now Norvasc and hydralazine.  Holding ACE inhibitor due to wheezing and  underlying cough.  GERD.  Placed on PPI.  History of sickle cell trait.  Monitor.      Condition - Extremely Guarded  Family Communication  :  daughter bedside 05/05/23  Code Status :  Full  Consults  :  PCCM  PUD Prophylaxis : PPI   Procedures  :  Disposition Plan  :    Status is: Observation  DVT Prophylaxis  :    enoxaparin (LOVENOX) injection 40 mg Start: 05/03/23 1500    Lab Results  Component Value Date   PLT 214 05/05/2023    Diet :  Diet Order             Diet regular Fluid consistency: Thin  Diet effective now                    Inpatient Medications  Scheduled Meds:  amLODipine  10 mg Oral Daily   enoxaparin (LOVENOX) injection  40 mg Subcutaneous Q24H   ipratropium-albuterol  3 mL Nebulization Q4H   loratadine  10 mg Oral Daily   methylPREDNISolone (SOLU-MEDROL) injection  40 mg Intravenous Q12H   mometasone-formoterol  2 puff Inhalation BID   montelukast  10 mg Oral Daily   pantoprazole  40 mg Oral Daily   sodium chloride flush  3 mL Intravenous Q12H   Continuous Infusions: PRN Meds:.acetaminophen **OR** acetaminophen, albuterol, hydrALAZINE, polyethylene glycol  Antibiotics  :    Anti-infectives (From admission, onward)    None         Objective:   Vitals:   05/05/23 1156 05/05/23 1157 05/05/23 1158 05/05/23 1159  BP:  (!) 155/93  (!) 155/93  Pulse: (!) 112 (!) 115 (!) 117 (!) 114  Resp: (!) 21 (!) 24 20 20   Temp:   98.2 F (36.8 C) 98.2 F (36.8 C)  TempSrc:    Oral  SpO2: 97% 98% 95% 98%  Weight:      Height:        Wt Readings from Last 3 Encounters:  05/03/23 72.1 kg  11/26/22 73 kg  11/18/22 74.8 kg    No intake or output data in the 24 hours ending 05/05/23 1203   Physical Exam  Awake Alert, Oriented X 3, No new F.N deficits, Normal affect Symmetrical Chest wall movement, wheezing present bilaterally, but no use of accessory muscles. RRR,No Gallops,Rubs or new Murmurs, No Parasternal  Heave +ve B.Sounds, Abd Soft, No tenderness, No rebound - guarding or rigidity. No Cyanosis, Clubbing or edema, No new Rash or bruise         Data Review:    Recent Labs  Lab 05/03/23 1255 05/04/23 0611 05/05/23 0746  WBC 11.7* 10.6* 13.9*  HGB 12.1 11.7* 12.0  HCT 36.1 34.8* 36.0  PLT 192 184 214  MCV 94.0 91.8 92.5  MCH 31.5 30.9 30.8  MCHC 33.5 33.6 33.3  RDW 13.1 13.1 13.1  LYMPHSABS  --   --  1.6  MONOABS  --   --  0.5  EOSABS  --   --  0.0  BASOSABS  --   --  0.0    Recent Labs  Lab 05/03/23 1255 05/04/23 0611 05/05/23 0746  NA 138 135 135  K 3.3* 3.9 4.4  CL 104 102 101  CO2 23 21* 25  ANIONGAP 11 12 9   GLUCOSE 185* 164* 150*  BUN 7* 18 21  CREATININE 0.97 1.16* 1.15*  AST  --  20  --   ALT  --  10  --   ALKPHOS  --  83  --   BILITOT  --  0.4  --   ALBUMIN  --  3.5  --   CRP  --  4.7* 1.7*  PROCALCITON  --  0.24 0.14  BNP 43.6 78.5 70.9  MG  --  2.1 2.2  CALCIUM 8.7* 8.8* 9.1      Recent Labs  Lab 05/03/23 1255 05/04/23 0611 05/05/23 0746  CRP  --  4.7* 1.7*  PROCALCITON  --  0.24 0.14  BNP 43.6 78.5 70.9  MG  --  2.1 2.2  CALCIUM 8.7* 8.8* 9.1    Recent Labs  Lab 05/03/23 1255 05/04/23 0611 05/05/23 0746  WBC 11.7* 10.6* 13.9*  PLT 192 184 214  CRP  --  4.7* 1.7*  PROCALCITON  --  0.24 0.14  CREATININE 0.97 1.16* 1.15*    ------------------------------------------------------------------------------------------------------------------ No results found for: "CHOL", "HDL", "LDLCALC", "LDLDIRECT", "TRIG", "CHOLHDL"  Lab Results  Component Value Date   HGBA1C 5.4 09/10/2020    No results for input(s): "TSH", "T4TOTAL", "T3FREE", "THYROIDAB" in the last 72 hours.  Invalid input(s): "FREET3" ------------------------------------------------------------------------------------------------------------------ Cardiac Enzymes No results for input(s): "CKMB", "TROPONINI", "MYOGLOBIN" in the last 168 hours.  Invalid input(s):  "CK"  Micro Results Recent Results (from the past 240 hour(s))  Respiratory (~20 pathogens) panel by PCR     Status: Abnormal   Collection Time: 05/03/23 10:06 PM   Specimen: Nasopharyngeal Swab; Respiratory  Result Value Ref Range Status   Adenovirus NOT DETECTED NOT DETECTED Final   Coronavirus 229E NOT DETECTED NOT DETECTED Final    Comment: (NOTE) The Coronavirus on the Respiratory Panel, DOES NOT test for the novel  Coronavirus (2019 nCoV)    Coronavirus HKU1 NOT DETECTED NOT DETECTED Final   Coronavirus NL63 NOT DETECTED NOT DETECTED Final   Coronavirus OC43 NOT DETECTED NOT DETECTED Final   Metapneumovirus NOT DETECTED NOT DETECTED Final   Rhinovirus / Enterovirus NOT DETECTED NOT DETECTED Final   Influenza A NOT DETECTED NOT DETECTED Final   Influenza B NOT DETECTED NOT DETECTED Final   Parainfluenza Virus 1 NOT DETECTED NOT DETECTED Final   Parainfluenza Virus 2 NOT DETECTED NOT DETECTED Final   Parainfluenza Virus 3 DETECTED (A) NOT DETECTED Final   Parainfluenza Virus 4 NOT DETECTED NOT DETECTED Final   Respiratory Syncytial Virus NOT DETECTED NOT DETECTED Final   Bordetella pertussis NOT DETECTED NOT DETECTED Final   Bordetella Parapertussis NOT DETECTED NOT DETECTED Final   Chlamydophila pneumoniae NOT DETECTED NOT DETECTED Final   Mycoplasma pneumoniae NOT DETECTED NOT DETECTED Final    Comment: Performed at [facility], [address] [place], [place] [address]  Expectorated Sputum Assessment w Gram Stain, Rflx to Resp Cult     Status: None   Collection Time: 05/04/23 12:09 PM   Specimen: Expectorated Sputum  Result Value Ref Range Status   Specimen Description EXPECTORATED SPUTUM  Final   Special Requests NONE  Final   Sputum evaluation   Final    THIS SPECIMEN IS ACCEPTABLE FOR SPUTUM CULTURE Performed at [facility], [address] [place], [place] [address]    Report Status 05/04/2023 FINAL  Final  Culture, Respiratory w Gram Stain      Status: None (Preliminary result)   Collection Time: 05/04/23 12:09 PM  Result Value Ref Range Status   Specimen Description EXPECTORATED SPUTUM  Final   Special Requests NONE Reflexed from [id number]  Final   Gram Stain   Final    ABUNDANT WBC PRESENT, PREDOMINANTLY PMN ABUNDANT GRAM POSITIVE COCCI IN PAIRS IN CHAINS    Culture   Final    CULTURE REINCUBATED FOR BETTER GROWTH Performed at [facility], [address], [place], [place] [address]    Report Status PENDING  Incomplete  Radiology Reports DG Chest Port 1 View  Result Date: 05/04/2023 CLINICAL DATA:  88 year old female with shortness of breath. EXAM: PORTABLE CHEST 1 VIEW COMPARISON:  Portable chest 05/03/2023 and earlier. FINDINGS: Portable AP upright view at 0645 hours. Stable lung volumes and mediastinal contours, within normal limits. Stable ventilation and Allowing for portable technique the lungs are clear. No pneumothorax or convincing pleural effusion. Chronic severe bilateral glenohumeral degeneration. Negative visible bowel gas. IMPRESSION: No acute cardiopulmonary abnormality. Electronically Signed   By: Odessa Fleming M.D.   On: 05/04/2023 07:11   DG Chest Portable 1 View  Result Date: 05/03/2023 CLINICAL DATA:  Shortness of breath EXAM: PORTABLE CHEST 1 VIEW COMPARISON:  08/28/2022 FINDINGS: No focal consolidation. No pleural effusion or pneumothorax. Heart and mediastinal contours are unremarkable. No acute osseous abnormality. Severe osteoarthritis of bilateral glenohumeral joints. IMPRESSION: 1. No acute cardiopulmonary disease. Electronically Signed   By: Elige Ko M.D.   On: 05/03/2023 13:05      Signature  -   Mliss Fritz Delona Clasby M.D on 05/05/2023 at 12:03 PM   -  To page go to [email or web address]

## 2023-05-05 NOTE — TOC Initial Note (Addendum)
Transition of Care [facility]) - Initial/Assessment Note    Patient Details  Name: Mary Chase MRN: [id number] Date of Birth: 23-Feb-1935  Transition of Care [facility]) CM/SW Contact:    Lawerance Sabal, RN Phone Number: 05/05/2023, 9:46 AM  Clinical Narrative:                  Sherron Monday w patient and visitor at bedside.  She states that she has DME nebulizer, several walkers and shower seats at home.  She does not have home oxygen, may need for discharge, she does not have preference for DME provider. Will follow for need.   She is agreeable for [facility] services. She used [facility] in 2021 and would like to try with them first, if unable to take she is agreeable to any highly rated agency.  Referral to [facility] accepted with [facility] 5/20   She denies barriers with acces to care and medications, her children assist her and will be able to provide transportation home.   Expected Discharge Plan: Home w Home Health Services Barriers to Discharge: Continued Medical Work up   Patient Goals and CMS Choice Patient states their goals for this hospitalization and ongoing recovery are:: to go home CMS Medicare.gov Compare Post Acute Care list provided to:: Patient Choice offered to / list presented to : Patient      Expected Discharge Plan and Services   Discharge Planning Services: CM Consult Post Acute Care Choice: Home Health Living arrangements for the past 2 months: Single Family Home                 DME Arranged: N/A         HH Arranged: PT, OT          Prior Living Arrangements/Services Living arrangements for the past 2 months: Single Family Home                Current home services: DME    Activities of Daily Living Home Assistive Devices/Equipment: Nebulizer, Wheelchair, Environmental consultant (specify type), Cane (specify quad or straight) ADL Screening (condition at time of admission) Patient's cognitive ability adequate to safely complete daily activities?: Yes Is the patient deaf or  have difficulty hearing?: No Does the patient have difficulty seeing, even when wearing glasses/contacts?: No Does the patient have difficulty concentrating, remembering, or making decisions?: No Patient able to express need for assistance with ADLs?: Yes Does the patient have difficulty dressing or bathing?: No Independently performs ADLs?: Yes (appropriate for developmental age) Does the patient have difficulty walking or climbing stairs?: Yes Weakness of Legs: None Weakness of Arms/Hands: None  Permission Sought/Granted                  Emotional Assessment              Admission diagnosis:  Hypoxia [R09.02] COPD exacerbation (HCC) [J44.1] Acute respiratory failure with hypoxia (HCC) [J96.01] Patient Active Problem List   Diagnosis Date Noted   COPD exacerbation (HCC) 05/04/2023   Asthma, chronic, unspecified asthma severity, with acute exacerbation 05/03/2023   HTN (hypertension) 05/03/2023   Acute respiratory failure with hypoxia (HCC) 05/03/2023   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   Osteoarthritis of right hip 09/11/2020   Obstructive lung disease (HCC) 08/31/2015   H/O allergic rhinitis 08/31/2015   PCP:  Joycelyn Rua, MD Pharmacy:   [facility] [phone] [place], [place] - [address] [place] [address] Phone: [phone] Fax: [phone]  Social Determinants of Health (SDOH) Social History: SDOH Screenings   Food Insecurity: No Food Insecurity (05/03/2023)  Housing: Low Risk  (05/03/2023)  Transportation Needs: No Transportation Needs (05/03/2023)  Utilities: Not At Risk (05/03/2023)  Tobacco Use: Low Risk  (05/03/2023)   SDOH Interventions:     Readmission Risk Interventions     No data to display

## 2023-05-05 NOTE — Progress Notes (Signed)
NAME:  Mary Chase, MRN:  [id number], DOB:  01-26-1935, LOS: 1 ADMISSION DATE:  05/03/2023, CONSULTATION DATE:  5/14 REFERRING MD:  Dr. Thedore Mins, CHIEF COMPLAINT:  sob   History of Present Illness:  Patient is a 88 year old female with pertinent PMH asthma, COPD, HTN, GERD, sickle cell trait presents to [facility] ED on 5/13 with SOB.  Patient states they have been having increased WOB since 5/11 with wheezing.  Patient tried home albuterol inhalers without improvement.  On 5/13 patient with increased WOB and also having symptoms of lightheadedness and dizzy.  Upon EMS arrival found to be hypoxic 88% on room air.  Patient given breathing treatments, steroids, mag and transported to [facility] ED.  Upon arrival, RRR low 20s and tachycardia 100-120s.  Patient initially on 4 LNC but then required BiPAP for WOB.  CXR with no acute abnormality.  WBC 11.7 and afebrile.  Troponin and BNP negative.  Patient given more breathing treatments and started on steroids.  RVP showing parainfluenza positive.  Patient admitted to progressive.  On 5/14, patient wore bipap throughout the night. Still hypoxic requiring 5L [place]. PCCM consulted.  Pertinent  Medical History   Past Medical History:  Diagnosis Date   Arthritis    Asthma    Cataract    COPD (chronic obstructive pulmonary disease) (HCC)    Dyspnea    with exertion   GERD (gastroesophageal reflux disease)    Headache    Hypertension    Pneumonia    Pre-diabetes    A1c within normal limits last check   Sickle cell anemia (HCC)    trait   Sickle cell trait (HCC)      Significant Hospital Events: Including procedures, antibiotic start and stop dates in addition to other pertinent events   5/13 admitted w/ asthma/copd exacerbation; admitted to progressive on bipap 5/14 pccm consulted   Interim History / Subjective:  Reports feeling better  Objective   Blood pressure 126/78, pulse 95, temperature 98 F (36.7 C), temperature source Oral, resp. rate  16, height 5' (1.524 m), weight 72.1 kg, SpO2 98 %.    FiO2 (%):  [32 %] 32 % Pressure Support:  [8 cmH20] 8 cmH20   Intake/Output Summary (Last 24 hours) at 05/05/2023 1041 Last data filed at 05/04/2023 1200 Gross per 24 hour  Intake 120 ml  Output --  Net 120 ml   Filed Weights   05/03/23 1853  Weight: 72.1 kg    Examination: Elderly female currently on 2 L nasal cannula sats 97% No JVD or lymphadenopathy is appreciated Rhonchi and expiratory wheezing noted Heart sounds are distant Abdomen obese soft nontender Lower extremities without edema   Resolved Hospital Problem list     Assessment & Plan:  Asthma/COPD exacerbation: on wixela at home Acute respiratory failure w/ hypoxia Parainfluenza virus P: Wean O2 as tolerated is not home O2 dependent at this time. Continue Solu-Medrol 40 mg every 12 hours Continue DuoNeb, Dulera, Singulair and Claritin Pulmonary toilet Out of bed as tolerated Monitor culture data     Best Practice (right click and "Reselect all SmartList Selections" daily)   Per primary  Labs   CBC: Recent Labs  Lab 05/03/23 1255 05/04/23 0611 05/05/23 0746  WBC 11.7* 10.6* 13.9*  NEUTROABS  --   --  11.7*  HGB 12.1 11.7* 12.0  HCT 36.1 34.8* 36.0  MCV 94.0 91.8 92.5  PLT 192 184 214     Basic Metabolic Panel: Recent Labs  Lab 05/03/23 1255  05/04/23 0611 05/05/23 0746  NA 138 135 135  K 3.3* 3.9 4.4  CL 104 102 101  CO2 23 21* 25  GLUCOSE 185* 164* 150*  BUN 7* 18 21  CREATININE 0.97 1.16* 1.15*  CALCIUM 8.7* 8.8* 9.1  MG  --  2.1 2.2    GFR: Estimated Creatinine Clearance: 31.7 mL/min (A) (by C-G formula based on SCr of 1.15 mg/dL (H)). Recent Labs  Lab 05/03/23 1255 05/04/23 0611 05/05/23 0746  PROCALCITON  --  0.24 0.14  WBC 11.7* 10.6* 13.9*     Liver Function Tests: Recent Labs  Lab 05/04/23 0611  AST 20  ALT 10  ALKPHOS 83  BILITOT 0.4  PROT 7.7  ALBUMIN 3.5    No results for input(s): "LIPASE",  "AMYLASE" in the last 168 hours. No results for input(s): "AMMONIA" in the last 168 hours.  ABG No results found for: "PHART", "PCO2ART", "PO2ART", "HCO3", "TCO2", "ACIDBASEDEF", "O2SAT"   Coagulation Profile: No results for input(s): "INR", "PROTIME" in the last 168 hours.  Cardiac Enzymes: No results for input(s): "CKTOTAL", "CKMB", "CKMBINDEX", "TROPONINI" in the last 168 hours.  HbA1C: Hgb A1c MFr Bld  Date/Time Value Ref Range Status  09/10/2020 02:13 PM 5.4 4.8 - 5.6 % Final    Comment:    (NOTE) Pre diabetes:          5.7%-6.4%  Diabetes:              >6.4%  Glycemic control for   <7.0% adults with diabetes     CBG: No results for input(s): "GLUCAP" in the last 168 hours.    Brett Canales Azriel Dancy ACNP Acute Care Nurse Practitioner [facility] Pulmonary/Critical Care Please consult Amion 05/05/2023, 10:42 AM

## 2023-05-05 NOTE — Progress Notes (Signed)
Occupational Therapy Treatment Patient Details Name: Mary Chase MRN: [id number] DOB: [birth year] Today's Date: 05/05/2023   History of present illness Mary Chase is a 88 y.o. female presenting to [facility] on 5/13 for shortness of breath. Chest x-ray stable. PMHx: asthma, COPD, GERD, hypertension, sickle cell trait.   OT comments  Pt supine in bed with HOB elevated with friend present upon OT arrival. Pt agreeable to participation in skilled OT session. However, pt declined sitting EOB or OOB activity this session secondary to SOB and not needing to use the bathroom or complete any ADLs at this time. OT educated pt in and provided handouts to pt regarding energy conservation techniques and techniques for managing COPD with pt verbalizing understanding and demonstrating understanding of all training through teach back. Pt demonstrated ability to Independently state >5 energy conservation techniques without referring to handout. Pt will benefit from reinforcement of COPD management training. Pt is making progress toward OT goals. Pt VSS on 4L continuous O2 through nasal canula with pt supine in bed with HOB elevated throughout session. Pt will benefit from continued acute skilled OT services. Discharge plan remains appropriate.    Recommendations for follow up therapy are one component of a multi-disciplinary discharge planning process, led by the attending physician.  Recommendations may be updated based on patient status, additional functional criteria and insurance authorization.    Assistance Recommended at Discharge Intermittent Supervision/Assistance  Patient can return home with the following  A little help with walking and/or transfers;A little help with bathing/dressing/bathroom;Assistance with cooking/housework;Assist for transportation;Help with stairs or ramp for entrance   Equipment Recommendations       Recommendations for Other Services      Precautions / Restrictions  Precautions Precautions: Fall;Other (comment) Precaution Comments: watch HR and O2 Restrictions Weight Bearing Restrictions: No       Mobility Bed Mobility                    Transfers                         Balance                                           ADL either performed or assessed with clinical judgement   ADL                                         General ADL Comments: OT instructed pt in energy conservation techniques and techniques for managing COPD to increase safety and independence with ADLs and functional transfers/mobilities during tasks. Pt verbalized understanding and demonstrated understanding of all training through teach back.    Extremity/Trunk Assessment              Vision       Perception     Praxis      Cognition Arousal/Alertness: Awake/alert Behavior During Therapy: WFL for tasks assessed/performed Overall Cognitive Status: Within Functional Limits for tasks assessed                                 General Comments: A&Ox4, friend present at bedside        Exercises  Shoulder Instructions       General Comments Pt decline sitting EOB or OOB activity this session secondary to SOB and pt stating she did not need to use the bathroom or complete any ADLs at that time. Pt VSS on 4L continuous O2 through nasal canula with pt supine in bed with HOB elevated.    Pertinent Vitals/ Pain       Pain Assessment Pain Assessment: No/denies pain  Home Living                                          Prior Functioning/Environment              Frequency           Progress Toward Goals  OT Goals(current goals can now be found in the care plan section)  Progress towards OT goals: Progressing toward goals  Acute Rehab OT Goals Patient Stated Goal: To stay as active and independent as possible  Plan Discharge plan remains appropriate     Co-evaluation          OT goals addressed during session: ADL's and self-care      AM-PAC OT "6 Clicks" Daily Activity     Outcome Measure   Help from another person eating meals?: None Help from another person taking care of personal grooming?: A Little Help from another person toileting, which includes using toliet, bedpan, or urinal?: A Little Help from another person bathing (including washing, rinsing, drying)?: A Little Help from another person to put on and taking off regular upper body clothing?: A Little Help from another person to put on and taking off regular lower body clothing?: A Little 6 Click Score: 19    End of Session    OT Visit Diagnosis: Unsteadiness on feet (R26.81);Other (comment) (Decreased activity tolerance)   Activity Tolerance Patient tolerated treatment well   Patient Left in bed;with call bell/phone within reach;with bed alarm set;with family/visitor present   Nurse Communication          Time: 4098-1191 OT Time Calculation (min): 31 min  Charges: OT General Charges $OT Visit: 1 Visit OT Treatments $Self Care/Home Management : 23-37 mins  Ninel Abdella "Orson Eva., OTR/L, MA Acute Rehab [phone]   Lendon Colonel 05/05/2023, 1:23 PM

## 2023-05-06 DIAGNOSIS — J9601 Acute respiratory failure with hypoxia: Secondary | ICD-10-CM | POA: Diagnosis not present

## 2023-05-06 LAB — CBC WITH DIFFERENTIAL/PLATELET
Abs Immature Granulocytes: 0.15 10*3/uL — ABNORMAL HIGH (ref 0.00–0.07)
Basophils Absolute: 0 10*3/uL (ref 0.0–0.1)
Basophils Relative: 0 %
Eosinophils Absolute: 0 10*3/uL (ref 0.0–0.5)
Eosinophils Relative: 0 %
HCT: 33.7 % — ABNORMAL LOW (ref 36.0–46.0)
Hemoglobin: 11.3 g/dL — ABNORMAL LOW (ref 12.0–15.0)
Immature Granulocytes: 1 %
Lymphocytes Relative: 9 %
Lymphs Abs: 1.3 10*3/uL (ref 0.7–4.0)
MCH: 31.5 pg (ref 26.0–34.0)
MCHC: 33.5 g/dL (ref 30.0–36.0)
MCV: 93.9 fL (ref 80.0–100.0)
Monocytes Absolute: 0.5 10*3/uL (ref 0.1–1.0)
Monocytes Relative: 4 %
Neutro Abs: 12.4 10*3/uL — ABNORMAL HIGH (ref 1.7–7.7)
Neutrophils Relative %: 86 %
Platelets: 205 10*3/uL (ref 150–400)
RBC: 3.59 MIL/uL — ABNORMAL LOW (ref 3.87–5.11)
RDW: 12.9 % (ref 11.5–15.5)
WBC: 14.3 10*3/uL — ABNORMAL HIGH (ref 4.0–10.5)
nRBC: 0 % (ref 0.0–0.2)

## 2023-05-06 LAB — BASIC METABOLIC PANEL
Anion gap: 8 (ref 5–15)
BUN: 22 mg/dL (ref 8–23)
CO2: 24 mmol/L (ref 22–32)
Calcium: 8.8 mg/dL — ABNORMAL LOW (ref 8.9–10.3)
Chloride: 103 mmol/L (ref 98–111)
Creatinine, Ser: 1.04 mg/dL — ABNORMAL HIGH (ref 0.44–1.00)
GFR, Estimated: 53 mL/min — ABNORMAL LOW (ref >60)
Glucose, Bld: 184 mg/dL — ABNORMAL HIGH (ref 70–99)
Potassium: 4.2 mmol/L (ref 3.5–5.1)
Sodium: 135 mmol/L (ref 135–145)

## 2023-05-06 LAB — GLUCOSE, CAPILLARY: Glucose-Capillary: 149 mg/dL — ABNORMAL HIGH (ref 70–99)

## 2023-05-06 LAB — MAGNESIUM: Magnesium: 2.1 mg/dL (ref 1.7–2.4)

## 2023-05-06 LAB — PROCALCITONIN: Procalcitonin: <0.1 ng/mL

## 2023-05-06 LAB — BRAIN NATRIURETIC PEPTIDE: B Natriuretic Peptide: 38.3 pg/mL (ref 0.0–100.0)

## 2023-05-06 LAB — C-REACTIVE PROTEIN: CRP: 0.7 mg/dL (ref <1.0)

## 2023-05-06 NOTE — Progress Notes (Signed)
Pt. Did not tolerate Bipap. Productive Coughing fit after breathing treatment. Currently on 3LNC. Will continue to monitor.

## 2023-05-06 NOTE — Progress Notes (Signed)
PROGRESS NOTE                                                                                                                                                                                                             Patient Demographics:    Mary Chase, is a 88 y.o. female, DOB - Jun 29, 1935, ZOX:[id number]  Outpatient Primary MD for the patient is Mary Rua, MD    LOS - 2  Admit date - 05/03/2023    Chief Complaint  Patient presents with   Shortness of Breath       Brief Narrative (HPI from H&P)     88 y.o. female with medical history significant of asthma, COPD, GERD, hypertension, sickle cell trait presenting with of shortness of breath, present at rest worse with exertion, no orthopnea, no fever chills or sick contacts.  She has history of asthma and 1-2 2 times a year it acts up quite bad according to the patient.  She was brought from home by EMS upon arrival to the ED she was found to be severely hypoxic and placed on BiPAP.  She was admitted for acute hypoxic respiratory failure, asthma exacerbation caused by parainfluenza virus infection.   Subjective:    [name] reports generalized weakness, still reports cough and dyspnea    Assessment  & Plan :   Acute hypoxic respiratory failure due to severe asthma exacerbation caused by parainfluenza viral infection.  - she has extremely poor movement bilaterally with loud wheezing, she has received oral prednisone this morning but I will switch her back to IV steroids, nebulizer treatments and inhaled steroids along with H2 blocker added.   -Required BiPAP initially, currently no BiPAP requirement over last 48 hours. -she remains on 3 L nasal cannula at rest today. -IV steroids, continue with DuoNebs, Dulera, Singulair and Claritin.  Essential hypertension.  For now Norvasc and hydralazine.  Holding ACE inhibitor due to wheezing and underlying  cough.  GERD.  Placed on PPI.  History of sickle cell trait.  Monitor.      Condition - Extremely Guarded  Family Communication  :  daughter bedside 05/05/23  Code Status :  Full  Consults  :  PCCM  PUD Prophylaxis : PPI   Procedures  :            Disposition Plan  :  Status is: Observation  DVT Prophylaxis  :    enoxaparin (LOVENOX) injection 40 mg Start: 05/03/23 1500    Lab Results  Component Value Date   PLT 205 05/06/2023    Diet :  Diet Order             Diet regular Fluid consistency: Thin  Diet effective now                    Inpatient Medications  Scheduled Meds:  amLODipine  10 mg Oral Daily   enoxaparin (LOVENOX) injection  40 mg Subcutaneous Q24H   ipratropium-albuterol  3 mL Nebulization Q4H   loratadine  10 mg Oral Daily   methylPREDNISolone (SOLU-MEDROL) injection  40 mg Intravenous Q12H   mometasone-formoterol  2 puff Inhalation BID   montelukast  10 mg Oral Daily   pantoprazole  40 mg Oral Daily   sodium chloride flush  3 mL Intravenous Q12H   Continuous Infusions: PRN Meds:.acetaminophen **OR** acetaminophen, albuterol, hydrALAZINE, polyethylene glycol  Antibiotics  :    Anti-infectives (From admission, onward)    None         Objective:   Vitals:   05/06/23 0424 05/06/23 0642 05/06/23 0700 05/06/23 1251  BP: (!) 146/78     Pulse: (!) 108 88    Resp: (!) 22 11    Temp: 98 F (36.7 C)   98 F (36.7 C)  TempSrc: Oral  Oral Axillary  SpO2: 100% 100%    Weight:      Height:        Wt Readings from Last 3 Encounters:  05/03/23 72.1 kg  11/26/22 73 kg  11/18/22 74.8 kg     Intake/Output Summary (Last 24 hours) at 05/06/2023 1415 Last data filed at 05/05/2023 1700 Gross per 24 hour  Intake 240 ml  Output --  Net 240 ml     Physical Exam  Awake Alert, Oriented X 3, No new F.N deficits, Normal affect Symmetrical Chest wall movement, with diffuse bilateral wheezing. RRR,No Gallops,Rubs or new  Murmurs, No Parasternal Heave +ve B.Sounds, Abd Soft, No tenderness, No rebound - guarding or rigidity. No Cyanosis, Clubbing or edema, No new Rash or bruise       Data Review:    Recent Labs  Lab 05/03/23 1255 05/04/23 0611 05/05/23 0746 05/06/23 0411  WBC 11.7* 10.6* 13.9* 14.3*  HGB 12.1 11.7* 12.0 11.3*  HCT 36.1 34.8* 36.0 33.7*  PLT 192 184 214 205  MCV 94.0 91.8 92.5 93.9  MCH 31.5 30.9 30.8 31.5  MCHC 33.5 33.6 33.3 33.5  RDW 13.1 13.1 13.1 12.9  LYMPHSABS  --   --  1.6 1.3  MONOABS  --   --  0.5 0.5  EOSABS  --   --  0.0 0.0  BASOSABS  --   --  0.0 0.0    Recent Labs  Lab 05/03/23 1255 05/04/23 0611 05/05/23 0746 05/06/23 0411  NA 138 135 135 135  K 3.3* 3.9 4.4 4.2  CL 104 102 101 103  CO2 23 21* 25 24  ANIONGAP 11 12 9 8   GLUCOSE 185* 164* 150* 184*  BUN 7* 18 21 22   CREATININE 0.97 1.16* 1.15* 1.04*  AST  --  20  --   --   ALT  --  10  --   --   ALKPHOS  --  83  --   --   BILITOT  --  0.4  --   --  ALBUMIN  --  3.5  --   --   CRP  --  4.7* 1.7* 0.7  PROCALCITON  --  0.24 0.14 <0.10  BNP 43.6 78.5 70.9 38.3  MG  --  2.1 2.2 2.1  CALCIUM 8.7* 8.8* 9.1 8.8*      Recent Labs  Lab 05/03/23 1255 05/04/23 0611 05/05/23 0746 05/06/23 0411  CRP  --  4.7* 1.7* 0.7  PROCALCITON  --  0.24 0.14 <0.10  BNP 43.6 78.5 70.9 38.3  MG  --  2.1 2.2 2.1  CALCIUM 8.7* 8.8* 9.1 8.8*    Recent Labs  Lab 05/03/23 1255 05/04/23 0611 05/05/23 0746 05/06/23 0411  WBC 11.7* 10.6* 13.9* 14.3*  PLT 192 184 214 205  CRP  --  4.7* 1.7* 0.7  PROCALCITON  --  0.24 0.14 <0.10  CREATININE 0.97 1.16* 1.15* 1.04*    ------------------------------------------------------------------------------------------------------------------ No results found for: "CHOL", "HDL", "LDLCALC", "LDLDIRECT", "TRIG", "CHOLHDL"  Lab Results  Component Value Date   HGBA1C 5.4 09/10/2020    No results for input(s): "TSH", "T4TOTAL", "T3FREE", "THYROIDAB" in the last 72  hours.  Invalid input(s): "FREET3" ------------------------------------------------------------------------------------------------------------------ Cardiac Enzymes No results for input(s): "CKMB", "TROPONINI", "MYOGLOBIN" in the last 168 hours.  Invalid input(s): "CK"  Micro Results Recent Results (from the past 240 hour(s))  Respiratory (~20 pathogens) panel by PCR     Status: Abnormal   Collection Time: 05/03/23 10:06 PM   Specimen: Nasopharyngeal Swab; Respiratory  Result Value Ref Range Status   Adenovirus NOT DETECTED NOT DETECTED Final   Coronavirus 229E NOT DETECTED NOT DETECTED Final    Comment: (NOTE) The Coronavirus on the Respiratory Panel, DOES NOT test for the novel  Coronavirus (2019 nCoV)    Coronavirus HKU1 NOT DETECTED NOT DETECTED Final   Coronavirus NL63 NOT DETECTED NOT DETECTED Final   Coronavirus OC43 NOT DETECTED NOT DETECTED Final   Metapneumovirus NOT DETECTED NOT DETECTED Final   Rhinovirus / Enterovirus NOT DETECTED NOT DETECTED Final   Influenza A NOT DETECTED NOT DETECTED Final   Influenza B NOT DETECTED NOT DETECTED Final   Parainfluenza Virus 1 NOT DETECTED NOT DETECTED Final   Parainfluenza Virus 2 NOT DETECTED NOT DETECTED Final   Parainfluenza Virus 3 DETECTED (A) NOT DETECTED Final   Parainfluenza Virus 4 NOT DETECTED NOT DETECTED Final   Respiratory Syncytial Virus NOT DETECTED NOT DETECTED Final   Bordetella pertussis NOT DETECTED NOT DETECTED Final   Bordetella Parapertussis NOT DETECTED NOT DETECTED Final   Chlamydophila pneumoniae NOT DETECTED NOT DETECTED Final   Mycoplasma pneumoniae NOT DETECTED NOT DETECTED Final    Comment: Performed at [facility], [address], [place], [place] [address]  Expectorated Sputum Assessment w Gram Stain, Rflx to Resp Cult     Status: None   Collection Time: 05/04/23 12:09 PM   Specimen: Expectorated Sputum  Result Value Ref Range Status   Specimen Description EXPECTORATED SPUTUM  Final    Special Requests NONE  Final   Sputum evaluation   Final    THIS SPECIMEN IS ACCEPTABLE FOR SPUTUM CULTURE Performed at [facility], [address] [place], [place] [address]    Report Status 05/04/2023 FINAL  Final  Culture, Respiratory w Gram Stain     Status: None (Preliminary result)   Collection Time: 05/04/23 12:09 PM  Result Value Ref Range Status   Specimen Description EXPECTORATED SPUTUM  Final   Special Requests NONE Reflexed from [id number]  Final   Gram  Stain   Final    ABUNDANT WBC PRESENT, PREDOMINANTLY PMN ABUNDANT GRAM POSITIVE COCCI IN PAIRS IN CHAINS    Culture   Final    CULTURE REINCUBATED FOR BETTER GROWTH Performed at [facility], [address], [place], [place] [address]    Report Status PENDING  Incomplete    Radiology Reports DG Chest Port 1 View  Result Date: 05/04/2023 CLINICAL DATA:  88 year old female with shortness of breath. EXAM: PORTABLE CHEST 1 VIEW COMPARISON:  Portable chest 05/03/2023 and earlier. FINDINGS: Portable AP upright view at 0645 hours. Stable lung volumes and mediastinal contours, within normal limits. Stable ventilation and Allowing for portable technique the lungs are clear. No pneumothorax or convincing pleural effusion. Chronic severe bilateral glenohumeral degeneration. Negative visible bowel gas. IMPRESSION: No acute cardiopulmonary abnormality. Electronically Signed   By: Odessa Fleming M.D.   On: 05/04/2023 07:11   DG Chest Portable 1 View  Result Date: 05/03/2023 CLINICAL DATA:  Shortness of breath EXAM: PORTABLE CHEST 1 VIEW COMPARISON:  08/28/2022 FINDINGS: No focal consolidation. No pleural effusion or pneumothorax. Heart and mediastinal contours are unremarkable. No acute osseous abnormality. Severe osteoarthritis of bilateral glenohumeral joints. IMPRESSION: 1. No acute cardiopulmonary disease. Electronically Signed   By: Elige Ko M.D.   On: 05/03/2023 13:05      Signature  -   Mliss Fritz Donnivan Villena M.D on 05/06/2023 at  2:15 PM   -  To page go to [email or web address]

## 2023-05-06 NOTE — Progress Notes (Signed)
NAME:  Mary Chase, MRN:  [id number], DOB:  Aug 22, 1935, LOS: 2 ADMISSION DATE:  05/03/2023, CONSULTATION DATE:  5/14 REFERRING MD:  Dr. Thedore Mins, CHIEF COMPLAINT:  sob   History of Present Illness:  Patient is a 88 year old female with pertinent PMH asthma, COPD, HTN, GERD, sickle cell trait presents to [facility] ED on 5/13 with SOB.  Patient states they have been having increased WOB since 5/11 with wheezing.  Patient tried home albuterol inhalers without improvement.  On 5/13 patient with increased WOB and also having symptoms of lightheadedness and dizzy.  Upon EMS arrival found to be hypoxic 88% on room air.  Patient given breathing treatments, steroids, mag and transported to [facility] ED.  Upon arrival, RRR low 20s and tachycardia 100-120s.  Patient initially on 4 LNC but then required BiPAP for WOB.  CXR with no acute abnormality.  WBC 11.7 and afebrile.  Troponin and BNP negative.  Patient given more breathing treatments and started on steroids.  RVP showing parainfluenza positive.  Patient admitted to progressive.  On 5/14, patient wore bipap throughout the night. Still hypoxic requiring 5L [place]. PCCM consulted.  Pertinent  Medical History   Past Medical History:  Diagnosis Date   Arthritis    Asthma    Cataract    COPD (chronic obstructive pulmonary disease) (HCC)    Dyspnea    with exertion   GERD (gastroesophageal reflux disease)    Headache    Hypertension    Pneumonia    Pre-diabetes    A1c within normal limits last check   Sickle cell anemia (HCC)    trait   Sickle cell trait (HCC)      Significant Hospital Events: Including procedures, antibiotic start and stop dates in addition to other pertinent events   5/13 admitted w/ asthma/copd exacerbation; admitted to progressive on bipap 5/14 pccm consulted   Interim History / Subjective:  No acute distress at rest  Objective   Blood pressure (!) 146/78, pulse 88, temperature 98 F (36.7 C), temperature source Oral, resp.  rate 11, height 5' (1.524 m), weight 72.1 kg, SpO2 100 %.        Intake/Output Summary (Last 24 hours) at 05/06/2023 0930 Last data filed at 05/05/2023 1700 Gross per 24 hour  Intake 360 ml  Output --  Net 360 ml   Filed Weights   05/03/23 1853  Weight: 72.1 kg    Examination: Obese female sleep on arrival in no acute distress sleeping.  Unable to tolerate BiPAP due to upper congestion No JVD or lymphadenopathy is appreciated Diminished breath sounds throughout Heart sounds are regular Abdomen obese soft nontender positive bowel sounds 1+ lower extremity edema   Resolved Hospital Problem list     Assessment & Plan:  Asthma/COPD exacerbation: on wixela at home Acute respiratory failure w/ hypoxia Parainfluenza virus P: Unable to tolerate BiPAP due to congestion Wean O2 as tolerated Continue Solu-Medrol 40 mg every 12 hours Continue DuoNeb, Dulera, Singulair and Claritin Encouraged to do pulmonary toilet Encouraged to mobilize Proton pump inhibitor Monitor culture data other than positive for parainfluenza remains negative at this time.         Best Practice (right click and "Reselect all SmartList Selections" daily)   Per primary  Labs   CBC: Recent Labs  Lab 05/03/23 1255 05/04/23 0611 05/05/23 0746 05/06/23 0411  WBC 11.7* 10.6* 13.9* 14.3*  NEUTROABS  --   --  11.7* 12.4*  HGB 12.1 11.7* 12.0 11.3*  HCT 36.1 34.8*  36.0 33.7*  MCV 94.0 91.8 92.5 93.9  PLT 192 184 214 205    Basic Metabolic Panel: Recent Labs  Lab 05/03/23 1255 05/04/23 0611 05/05/23 0746 05/06/23 0411  NA 138 135 135 135  K 3.3* 3.9 4.4 4.2  CL 104 102 101 103  CO2 23 21* 25 24  GLUCOSE 185* 164* 150* 184*  BUN 7* 18 21 22   CREATININE 0.97 1.16* 1.15* 1.04*  CALCIUM 8.7* 8.8* 9.1 8.8*  MG  --  2.1 2.2 2.1   GFR: Estimated Creatinine Clearance: 35 mL/min (A) (by C-G formula based on SCr of 1.04 mg/dL (H)). Recent Labs  Lab 05/03/23 1255 05/04/23 0611  05/05/23 0746 05/06/23 0411  PROCALCITON  --  0.24 0.14 <0.10  WBC 11.7* 10.6* 13.9* 14.3*    Liver Function Tests: Recent Labs  Lab 05/04/23 0611  AST 20  ALT 10  ALKPHOS 83  BILITOT 0.4  PROT 7.7  ALBUMIN 3.5   No results for input(s): "LIPASE", "AMYLASE" in the last 168 hours. No results for input(s): "AMMONIA" in the last 168 hours.  ABG No results found for: "PHART", "PCO2ART", "PO2ART", "HCO3", "TCO2", "ACIDBASEDEF", "O2SAT"   Coagulation Profile: No results for input(s): "INR", "PROTIME" in the last 168 hours.  Cardiac Enzymes: No results for input(s): "CKTOTAL", "CKMB", "CKMBINDEX", "TROPONINI" in the last 168 hours.  HbA1C: Hgb A1c MFr Bld  Date/Time Value Ref Range Status  09/10/2020 02:13 PM 5.4 4.8 - 5.6 % Final    Comment:    (NOTE) Pre diabetes:          5.7%-6.4%  Diabetes:              >6.4%  Glycemic control for   <7.0% adults with diabetes     CBG: No results for input(s): "GLUCAP" in the last 168 hours.    Brett Canales Lativia Velie ACNP Acute Care Nurse Practitioner [facility] Pul[facility]/Critical Care Please consult Amion 05/06/2023, 9:30 AM

## 2023-05-06 NOTE — Progress Notes (Signed)
Physical Therapy Treatment Patient Details Name: Mary Chase MRN: [id number] DOB: [birth year] Today's Date: 05/06/2023   History of Present Illness Mary Chase is a 88 y.o. female presenting to [facility] on 5/13 for shortness of breath. Chest x-ray stable. PMHx: asthma, COPD, GERD, hypertension, sickle cell trait.    PT Comments    Pt with continued progress towards acute goals this session, with focus on progression of functional transfers and gait. Pt able to demonstrate short bout of gait in room with RW support and min guard for safety wit cues for safe RW use, as pt leaning anterior over handles and resting L elbow on walker during gait despite cues for correction. Pt endorsing feeling less SOB this session with SpO2 91% on 2L during activity and 98% on 2L at rest. Pt able to demonstrate IS use with cues for longer slower inhale, pulling to . Educated pt on importance of continued mobility and importance of OOB with pt verbalizing understanding. Pt continues to benefit from skilled PT services to progress toward functional mobility goals.     Recommendations for follow up therapy are one component of a multi-disciplinary discharge planning process, led by the attending physician.  Recommendations may be updated based on patient status, additional functional criteria and insurance authorization.  Follow Up Recommendations       Assistance Recommended at Discharge Intermittent Supervision/Assistance  Patient can return home with the following A little help with walking and/or transfers;Help with stairs or ramp for entrance;Assist for transportation;Assistance with cooking/housework   Equipment Recommendations  None recommended by PT    Recommendations for Other Services       Precautions / Restrictions Precautions Precautions: Fall;Other (comment) Precaution Comments: watch HR and O2 Restrictions Weight Bearing Restrictions: No     Mobility  Bed Mobility Overal bed  mobility: Modified Independent             General bed mobility comments: up OOB on arrival    Transfers Overall transfer level: Needs assistance Equipment used: Rolling walker (2 wheels) Transfers: Sit to/from Stand Sit to Stand: Min guard           General transfer comment: min G fro safety    Ambulation/Gait Ambulation/Gait assistance: Min guard Gait Distance (Feet): 15 Feet Assistive device: Rolling walker (2 wheels) Gait Pattern/deviations: Step-through pattern, Trunk flexed, Decreased stride length Gait velocity: decr     General Gait Details: slow gait with RW, pt resting L elbow on RW, cued pt to use both hands with pt continuing to push with R hand and L elbow   Stairs             Wheelchair Mobility    Modified Rankin (Stroke Patients Only)       Balance Overall balance assessment: Modified Independent                                          Cognition Arousal/Alertness: Awake/alert Behavior During Therapy: WFL for tasks assessed/performed Overall Cognitive Status: Within Functional Limits for tasks assessed                                 General Comments: A&Ox4, daughter present at bedside        Exercises Other Exercises Other Exercises: IS use x10 with pt pulling to , cues for long  slow inhale    General Comments        Pertinent Vitals/Pain Pain Assessment Pain Assessment: No/denies pain    Home Living                          Prior Function            PT Goals (current goals can now be found in the care plan section) Acute Rehab PT Goals PT Goal Formulation: With patient Time For Goal Achievement: 05/18/23 Progress towards PT goals: Progressing toward goals    Frequency    Min 3X/week      PT Plan Current plan remains appropriate    Co-evaluation              AM-PAC PT "6 Clicks" Mobility   Outcome Measure  Help needed turning from your back to  your side while in a flat bed without using bedrails?: None Help needed moving from lying on your back to sitting on the side of a flat bed without using bedrails?: None Help needed moving to and from a bed to a chair (including a wheelchair)?: A Little Help needed standing up from a chair using your arms (e.g., wheelchair or bedside chair)?: A Little Help needed to walk in hospital room?: A Little Help needed climbing 3-5 steps with a railing? : A Lot 6 Click Score: 19    End of Session Equipment Utilized During Treatment: Oxygen Activity Tolerance: Patient limited by fatigue Patient left: in chair;with call bell/phone within reach;with family/visitor present Nurse Communication: Mobility status PT Visit Diagnosis: Other abnormalities of gait and mobility (R26.89);Difficulty in walking, not elsewhere classified (R26.2)     Time: 1610-9604 PT Time Calculation (min) (ACUTE ONLY): 16 min  Charges:  $Therapeutic Activity: 8-22 mins                     Itzabella Sorrels R. PTA Acute Rehabilitation Services Office: [phone]    Catalina Antigua 05/06/2023, 11:41 AM

## 2023-05-07 DIAGNOSIS — J45901 Unspecified asthma with (acute) exacerbation: Secondary | ICD-10-CM | POA: Diagnosis not present

## 2023-05-07 DIAGNOSIS — J9601 Acute respiratory failure with hypoxia: Secondary | ICD-10-CM | POA: Diagnosis not present

## 2023-05-07 LAB — BASIC METABOLIC PANEL
Anion gap: 8 (ref 5–15)
BUN: 17 mg/dL (ref 8–23)
CO2: 25 mmol/L (ref 22–32)
Calcium: 9 mg/dL (ref 8.9–10.3)
Chloride: 103 mmol/L (ref 98–111)
Creatinine, Ser: 0.98 mg/dL (ref 0.44–1.00)
GFR, Estimated: 57 mL/min — ABNORMAL LOW (ref >60)
Glucose, Bld: 168 mg/dL — ABNORMAL HIGH (ref 70–99)
Potassium: 4.4 mmol/L (ref 3.5–5.1)
Sodium: 136 mmol/L (ref 135–145)

## 2023-05-07 LAB — CBC WITH DIFFERENTIAL/PLATELET
Abs Immature Granulocytes: 0.31 10*3/uL — ABNORMAL HIGH (ref 0.00–0.07)
Basophils Absolute: 0 10*3/uL (ref 0.0–0.1)
Basophils Relative: 0 %
Eosinophils Absolute: 0 10*3/uL (ref 0.0–0.5)
Eosinophils Relative: 0 %
HCT: 32.9 % — ABNORMAL LOW (ref 36.0–46.0)
Hemoglobin: 11.1 g/dL — ABNORMAL LOW (ref 12.0–15.0)
Immature Granulocytes: 2 %
Lymphocytes Relative: 10 %
Lymphs Abs: 1.3 10*3/uL (ref 0.7–4.0)
MCH: 30.6 pg (ref 26.0–34.0)
MCHC: 33.7 g/dL (ref 30.0–36.0)
MCV: 90.6 fL (ref 80.0–100.0)
Monocytes Absolute: 0.5 10*3/uL (ref 0.1–1.0)
Monocytes Relative: 4 %
Neutro Abs: 10.9 10*3/uL — ABNORMAL HIGH (ref 1.7–7.7)
Neutrophils Relative %: 84 %
Platelets: 224 10*3/uL (ref 150–400)
RBC: 3.63 MIL/uL — ABNORMAL LOW (ref 3.87–5.11)
RDW: 12.9 % (ref 11.5–15.5)
WBC: 13 10*3/uL — ABNORMAL HIGH (ref 4.0–10.5)
nRBC: 0 % (ref 0.0–0.2)

## 2023-05-07 LAB — CULTURE, RESPIRATORY W GRAM STAIN

## 2023-05-07 LAB — BRAIN NATRIURETIC PEPTIDE: B Natriuretic Peptide: 36.3 pg/mL (ref 0.0–100.0)

## 2023-05-07 LAB — PROCALCITONIN: Procalcitonin: <0.1 ng/mL

## 2023-05-07 LAB — MAGNESIUM: Magnesium: 1.9 mg/dL (ref 1.7–2.4)

## 2023-05-07 LAB — C-REACTIVE PROTEIN: CRP: 0.6 mg/dL (ref <1.0)

## 2023-05-07 MED ORDER — METHYLPREDNISOLONE SODIUM SUCC 125 MG IJ SOLR
125.0000 mg | Freq: Two times a day (BID) | INTRAMUSCULAR | Status: DC
  Administered 2023-05-07 – 2023-05-08 (×3): 125 mg via INTRAVENOUS
  Filled 2023-05-07 (×3): qty 2

## 2023-05-07 NOTE — Progress Notes (Signed)
NAME:  Mary Chase, MRN:  [id number], DOB:  03-15-1935, LOS: 3 ADMISSION DATE:  05/03/2023, CONSULTATION DATE:  5/14 REFERRING MD:  Dr. Thedore Mins, CHIEF COMPLAINT:  sob   History of Present Illness:  Patient is a 88 year old female with pertinent PMH asthma, COPD, HTN, GERD, sickle cell trait presents to [facility] ED on 5/13 with SOB.  Patient states they have been having increased WOB since 5/11 with wheezing.  Patient tried home albuterol inhalers without improvement.  On 5/13 patient with increased WOB and also having symptoms of lightheadedness and dizzy.  Upon EMS arrival found to be hypoxic 88% on room air.  Patient given breathing treatments, steroids, mag and transported to [facility] ED.  Upon arrival, RRR low 20s and tachycardia 100-120s.  Patient initially on 4 LNC but then required BiPAP for WOB.  CXR with no acute abnormality.  WBC 11.7 and afebrile.  Troponin and BNP negative.  Patient given more breathing treatments and started on steroids.  RVP showing parainfluenza positive.  Patient admitted to progressive.  On 5/14, patient wore bipap throughout the night. Still hypoxic requiring 5L [place]. PCCM consulted.  Pertinent  Medical History   Past Medical History:  Diagnosis Date   Arthritis    Asthma    Cataract    COPD (chronic obstructive pulmonary disease) (HCC)    Dyspnea    with exertion   GERD (gastroesophageal reflux disease)    Headache    Hypertension    Pneumonia    Pre-diabetes    A1c within normal limits last check   Sickle cell anemia (HCC)    trait   Sickle cell trait (HCC)      Significant Hospital Events: Including procedures, antibiotic start and stop dates in addition to other pertinent events   5/13 admitted w/ asthma/copd exacerbation; admitted to progressive on bipap 5/14 pccm consulted   Interim History / Subjective:  Reports feeling better  Objective   Blood pressure (!) 160/95, pulse (!) 113, temperature 97.9 F (36.6 C), temperature source Oral,  resp. rate 19, height 5' (1.524 m), weight 72.1 kg, SpO2 96 %.        Intake/Output Summary (Last 24 hours) at 05/07/2023 0934 Last data filed at 05/07/2023 0900 Gross per 24 hour  Intake 240 ml  Output --  Net 240 ml   Filed Weights   05/03/23 1853  Weight: 72.1 kg    Examination: Obese female who reports feeling better Better air movement chest she does have vocal cord dysfunction with coughing and bronchodilators her wheezing clears Heart sounds are distant Abdomen obese soft nontender positive bowel sounds Lower extremity mild edema  Resolved Hospital Problem list     Assessment & Plan:  Asthma/COPD exacerbation: on wixela at home Acute respiratory failure w/ hypoxia Parainfluenza virus P: Reports feeling better Noted to be wheezing which cleared with bronchodilators in coughing she has a component of vocal cord dysfunction that increases her wheezing. Continue wean O2 as tolerated She is placed back on Solu-Medrol Pulmonary toilet Continue to monitor culture data Mobilize as tolerated she is extremely deconditioned       Best Practice (right click and "Reselect all SmartList Selections" daily)   Per primary  Labs   CBC: Recent Labs  Lab 05/03/23 1255 05/04/23 0611 05/05/23 0746 05/06/23 0411 05/07/23 0631  WBC 11.7* 10.6* 13.9* 14.3* 13.0*  NEUTROABS  --   --  11.7* 12.4* 10.9*  HGB 12.1 11.7* 12.0 11.3* 11.1*  HCT 36.1 34.8* 36.0 33.7* 32.9*  MCV 94.0 91.8 92.5 93.9 90.6  PLT 192 184 214 205 224    Basic Metabolic Panel: Recent Labs  Lab 05/03/23 1255 05/04/23 0611 05/05/23 0746 05/06/23 0411 05/07/23 0631  NA 138 135 135 135 136  K 3.3* 3.9 4.4 4.2 4.4  CL 104 102 101 103 103  CO2 23 21* 25 24 25   GLUCOSE 185* 164* 150* 184* 168*  BUN 7* 18 21 22 17   CREATININE 0.97 1.16* 1.15* 1.04* 0.98  CALCIUM 8.7* 8.8* 9.1 8.8* 9.0  MG  --  2.1 2.2 2.1 1.9   GFR: Estimated Creatinine Clearance: 37.2 mL/min (by C-G formula based on SCr of  0.98 mg/dL). Recent Labs  Lab 05/04/23 0611 05/05/23 0746 05/06/23 0411 05/07/23 0631  PROCALCITON 0.24 0.14 <0.10 <0.10  WBC 10.6* 13.9* 14.3* 13.0*    Liver Function Tests: Recent Labs  Lab 05/04/23 0611  AST 20  ALT 10  ALKPHOS 83  BILITOT 0.4  PROT 7.7  ALBUMIN 3.5   No results for input(s): "LIPASE", "AMYLASE" in the last 168 hours. No results for input(s): "AMMONIA" in the last 168 hours.  ABG No results found for: "PHART", "PCO2ART", "PO2ART", "HCO3", "TCO2", "ACIDBASEDEF", "O2SAT"   Coagulation Profile: No results for input(s): "INR", "PROTIME" in the last 168 hours.  Cardiac Enzymes: No results for input(s): "CKTOTAL", "CKMB", "CKMBINDEX", "TROPONINI" in the last 168 hours.  HbA1C: Hgb A1c MFr Bld  Date/Time Value Ref Range Status  09/10/2020 02:13 PM 5.4 4.8 - 5.6 % Final    Comment:    (NOTE) Pre diabetes:          5.7%-6.4%  Diabetes:              >6.4%  Glycemic control for   <7.0% adults with diabetes     CBG: Recent Labs  Lab 05/06/23 1800  GLUCAP 149Brett Canales Therman Hughlett ACNP Acute Care Nurse Practitioner [facility] Pulmonary/Critical Care Please consult Amion 05/07/2023, 9:34 AM

## 2023-05-07 NOTE — Progress Notes (Signed)
Refusing to ambulate in hall after sitting up in recliner for the past hour, too fatigued.

## 2023-05-07 NOTE — Progress Notes (Signed)
PROGRESS NOTE                                                                                                                                                                                                             Patient Demographics:    Mary Chase, is a 88 y.o. female, DOB - 1935/01/16, VHQ:[id number]  Outpatient Primary MD for the patient is Joycelyn Rua, MD    LOS - 3  Admit date - 05/03/2023    Chief Complaint  Patient presents with   Shortness of Breath       Brief Narrative (HPI from H&P)     89 y.o. female with medical history significant of asthma, COPD, GERD, hypertension, sickle cell trait presenting with of shortness of breath, present at rest worse with exertion, no orthopnea, no fever chills or sick contacts.  She has history of asthma and 1-2 2 times a year it acts up quite bad according to the patient.  She was brought from home by EMS upon arrival to the ED she was found to be severely hypoxic and placed on BiPAP.  She was admitted for acute hypoxic respiratory failure, asthma exacerbation caused by parainfluenza virus infection.   Subjective:    Mary Chase 32 have cough, with productive phlegm, but overall reports she is feeling better.      Assessment  & Plan :   Acute hypoxic respiratory failure due to severe asthma exacerbation caused by parainfluenza viral infection.  - she has extremely poor movement bilaterally with loud wheezing, she has received oral prednisone this morning but I will switch her back to IV steroids, nebulizer treatments and inhaled steroids along with H2 blocker added.   -Required BiPAP initially, BiPAP requirement for few days, continue with oxygen for now, wean as tolerated, will ambulate in the hallway to see with her oxygen requirement. -She remains on IV steroids, tapering per PCCM. -continue with DuoNebs, Dulera, Singulair and Claritin.  Essential  hypertension.  For now Norvasc and hydralazine.  Holding ACE inhibitor due to wheezing and underlying cough.  GERD.  Placed on PPI.  History of sickle cell trait.  Monitor.      Condition - Extremely Guarded  Family Communication  :  daughter bedside 05/05/23  Code Status :  Full  Consults  :  PCCM  PUD Prophylaxis : PPI  Procedures  :            Disposition Plan  :    Status is: Observation  DVT Prophylaxis  :    enoxaparin (LOVENOX) injection 40 mg Start: 05/03/23 1500    Lab Results  Component Value Date   PLT 224 05/07/2023    Diet :  Diet Order             Diet regular Fluid consistency: Thin  Diet effective now                    Inpatient Medications  Scheduled Meds:  amLODipine  10 mg Oral Daily   enoxaparin (LOVENOX) injection  40 mg Subcutaneous Q24H   ipratropium-albuterol  3 mL Nebulization Q4H   loratadine  10 mg Oral Daily   methylPREDNISolone (SOLU-MEDROL) injection  40 mg Intravenous Q12H   mometasone-formoterol  2 puff Inhalation BID   montelukast  10 mg Oral Daily   pantoprazole  40 mg Oral Daily   sodium chloride flush  3 mL Intravenous Q12H   Continuous Infusions: PRN Meds:.acetaminophen **OR** acetaminophen, albuterol, hydrALAZINE, polyethylene glycol  Antibiotics  :    Anti-infectives (From admission, onward)    None         Objective:   Vitals:   05/07/23 0916 05/07/23 0917 05/07/23 0918 05/07/23 0919  BP:   (!) 160/95   Pulse: (!) 115 (!) 117 (!) 114 (!) 113  Resp: (!) 31 (!) 21 (!) 28 19  Temp:      TempSrc:      SpO2: 95% 95% 95% 96%  Weight:      Height:        Wt Readings from Last 3 Encounters:  05/03/23 72.1 kg  11/26/22 73 kg  11/18/22 74.8 kg     Intake/Output Summary (Last 24 hours) at 05/07/2023 1204 Last data filed at 05/07/2023 0900 Gross per 24 hour  Intake 240 ml  Output --  Net 240 ml     Physical Exam  Awake Alert, Oriented X 3, No new F.N deficits, Normal  affect Symmetrical Chest wall movement, improved air entry today, wheezing intermittent, improved with coughing RRR,No Gallops,Rubs or new Murmurs, No Parasternal Heave +ve B.Sounds, Abd Soft, No tenderness, No rebound - guarding or rigidity. No Cyanosis, Clubbing or edema, No new Rash or bruise       Data Review:    Recent Labs  Lab 05/03/23 1255 05/04/23 0611 05/05/23 0746 05/06/23 0411 05/07/23 0631  WBC 11.7* 10.6* 13.9* 14.3* 13.0*  HGB 12.1 11.7* 12.0 11.3* 11.1*  HCT 36.1 34.8* 36.0 33.7* 32.9*  PLT 192 184 214 205 224  MCV 94.0 91.8 92.5 93.9 90.6  MCH 31.5 30.9 30.8 31.5 30.6  MCHC 33.5 33.6 33.3 33.5 33.7  RDW 13.1 13.1 13.1 12.9 12.9  LYMPHSABS  --   --  1.6 1.3 1.3  MONOABS  --   --  0.5 0.5 0.5  EOSABS  --   --  0.0 0.0 0.0  BASOSABS  --   --  0.0 0.0 0.0    Recent Labs  Lab 05/03/23 1255 05/04/23 0611 05/05/23 0746 05/06/23 0411 05/07/23 0631  NA 138 135 135 135 136  K 3.3* 3.9 4.4 4.2 4.4  CL 104 102 101 103 103  CO2 23 21* 25 24 25   ANIONGAP 11 12 9 8 8   GLUCOSE 185* 164* 150* 184* 168*  BUN 7* 18 21 22 17   CREATININE 0.97 1.16*  1.15* 1.04* 0.98  AST  --  20  --   --   --   ALT  --  10  --   --   --   ALKPHOS  --  83  --   --   --   BILITOT  --  0.4  --   --   --   ALBUMIN  --  3.5  --   --   --   CRP  --  4.7* 1.7* 0.7 0.6  PROCALCITON  --  0.24 0.14 <0.10 <0.10  BNP 43.6 78.5 70.9 38.3 36.3  MG  --  2.1 2.2 2.1 1.9  CALCIUM 8.7* 8.8* 9.1 8.8* 9.0      Recent Labs  Lab 05/03/23 1255 05/04/23 0611 05/05/23 0746 05/06/23 0411 05/07/23 0631  CRP  --  4.7* 1.7* 0.7 0.6  PROCALCITON  --  0.24 0.14 <0.10 <0.10  BNP 43.6 78.5 70.9 38.3 36.3  MG  --  2.1 2.2 2.1 1.9  CALCIUM 8.7* 8.8* 9.1 8.8* 9.0    Recent Labs  Lab 05/03/23 1255 05/04/23 0611 05/05/23 0746 05/06/23 0411 05/07/23 0631  WBC 11.7* 10.6* 13.9* 14.3* 13.0*  PLT 192 184 214 205 224  CRP  --  4.7* 1.7* 0.7 0.6  PROCALCITON  --  0.24 0.14 <0.10 <0.10  CREATININE  0.97 1.16* 1.15* 1.04* 0.98    ------------------------------------------------------------------------------------------------------------------ No results found for: "CHOL", "HDL", "LDLCALC", "LDLDIRECT", "TRIG", "CHOLHDL"  Lab Results  Component Value Date   HGBA1C 5.4 09/10/2020    No results for input(s): "TSH", "T4TOTAL", "T3FREE", "THYROIDAB" in the last 72 hours.  Invalid input(s): "FREET3" ------------------------------------------------------------------------------------------------------------------ Cardiac Enzymes No results for input(s): "CKMB", "TROPONINI", "MYOGLOBIN" in the last 168 hours.  Invalid input(s): "CK"  Micro Results Recent Results (from the past 240 hour(s))  Respiratory (~20 pathogens) panel by PCR     Status: Abnormal   Collection Time: 05/03/23 10:06 PM   Specimen: Nasopharyngeal Swab; Respiratory  Result Value Ref Range Status   Adenovirus NOT DETECTED NOT DETECTED Final   Coronavirus 229E NOT DETECTED NOT DETECTED Final    Comment: (NOTE) The Coronavirus on the Respiratory Panel, DOES NOT test for the novel  Coronavirus (2019 nCoV)    Coronavirus HKU1 NOT DETECTED NOT DETECTED Final   Coronavirus NL63 NOT DETECTED NOT DETECTED Final   Coronavirus OC43 NOT DETECTED NOT DETECTED Final   Metapneumovirus NOT DETECTED NOT DETECTED Final   Rhinovirus / Enterovirus NOT DETECTED NOT DETECTED Final   Influenza A NOT DETECTED NOT DETECTED Final   Influenza B NOT DETECTED NOT DETECTED Final   Parainfluenza Virus 1 NOT DETECTED NOT DETECTED Final   Parainfluenza Virus 2 NOT DETECTED NOT DETECTED Final   Parainfluenza Virus 3 DETECTED (A) NOT DETECTED Final   Parainfluenza Virus 4 NOT DETECTED NOT DETECTED Final   Respiratory Syncytial Virus NOT DETECTED NOT DETECTED Final   Bordetella pertussis NOT DETECTED NOT DETECTED Final   Bordetella Parapertussis NOT DETECTED NOT DETECTED Final   Chlamydophila pneumoniae NOT DETECTED NOT DETECTED Final    Mycoplasma pneumoniae NOT DETECTED NOT DETECTED Final    Comment: Performed at [facility], [address], [place], [place] [address]  Expectorated Sputum Assessment w Gram Stain, Rflx to Resp Cult     Status: None   Collection Time: 05/04/23 12:09 PM   Specimen: Expectorated Sputum  Result Value Ref Range Status   Specimen Description EXPECTORATED SPUTUM  Final   Special Requests NONE  Final  Sputum evaluation   Final    THIS SPECIMEN IS ACCEPTABLE FOR SPUTUM CULTURE Performed at [facility], [address], [place], [place] [address]    Report Status 05/04/2023 FINAL  Final  Culture, Respiratory w Gram Stain     Status: None (Preliminary result)   Collection Time: 05/04/23 12:09 PM  Result Value Ref Range Status   Specimen Description EXPECTORATED SPUTUM  Final   Special Requests NONE Reflexed from [id number]  Final   Gram Stain   Final    ABUNDANT WBC PRESENT, PREDOMINANTLY PMN ABUNDANT GRAM POSITIVE COCCI IN PAIRS IN CHAINS    Culture   Final    CULTURE REINCUBATED FOR BETTER GROWTH Performed at [facility], [address] [place], [place] [address]    Report Status PENDING  Incomplete    Radiology Reports DG Chest Port 1 View  Result Date: 05/04/2023 CLINICAL DATA:  88 year old female with shortness of breath. EXAM: PORTABLE CHEST 1 VIEW COMPARISON:  Portable chest 05/03/2023 and earlier. FINDINGS: Portable AP upright view at 0645 hours. Stable lung volumes and mediastinal contours, within normal limits. Stable ventilation and Allowing for portable technique the lungs are clear. No pneumothorax or convincing pleural effusion. Chronic severe bilateral glenohumeral degeneration. Negative visible bowel gas. IMPRESSION: No acute cardiopulmonary abnormality. Electronically Signed   By: Odessa Fleming M.D.   On: 05/04/2023 07:11   DG Chest Portable 1 View  Result Date: 05/03/2023 CLINICAL DATA:  Shortness of breath EXAM: PORTABLE CHEST 1 VIEW COMPARISON:  08/28/2022  FINDINGS: No focal consolidation. No pleural effusion or pneumothorax. Heart and mediastinal contours are unremarkable. No acute osseous abnormality. Severe osteoarthritis of bilateral glenohumeral joints. IMPRESSION: 1. No acute cardiopulmonary disease. Electronically Signed   By: Elige Ko M.D.   On: 05/03/2023 13:05      Signature  -   Huey Bienenstock M.D on 05/07/2023 at 12:04 PM   -  To page go to [email or web address]

## 2023-05-07 NOTE — Progress Notes (Signed)
Refusing to ambulate, too SOB after getting onto [facility].

## 2023-05-07 NOTE — Care Management Important Message (Signed)
Important Message  Patient Details  Name: NATURELLE NEVIUS MRN: [id number] Date of Birth: 1935/05/03   Medicare Important Message Given:  Yes     Sherilyn Banker 05/07/2023, 4:03 PM

## 2023-05-08 DIAGNOSIS — J9601 Acute respiratory failure with hypoxia: Secondary | ICD-10-CM | POA: Diagnosis not present

## 2023-05-08 DIAGNOSIS — J45901 Unspecified asthma with (acute) exacerbation: Secondary | ICD-10-CM | POA: Diagnosis not present

## 2023-05-08 LAB — BASIC METABOLIC PANEL
Anion gap: 9 (ref 5–15)
BUN: 16 mg/dL (ref 8–23)
CO2: 25 mmol/L (ref 22–32)
Calcium: 9.1 mg/dL (ref 8.9–10.3)
Chloride: 101 mmol/L (ref 98–111)
Creatinine, Ser: 1.05 mg/dL — ABNORMAL HIGH (ref 0.44–1.00)
GFR, Estimated: 52 mL/min — ABNORMAL LOW (ref >60)
Glucose, Bld: 268 mg/dL — ABNORMAL HIGH (ref 70–99)
Potassium: 4.5 mmol/L (ref 3.5–5.1)
Sodium: 135 mmol/L (ref 135–145)

## 2023-05-08 LAB — CBC WITH DIFFERENTIAL/PLATELET
Abs Immature Granulocytes: 0.49 10*3/uL — ABNORMAL HIGH (ref 0.00–0.07)
Basophils Absolute: 0.1 10*3/uL (ref 0.0–0.1)
Basophils Relative: 0 %
Eosinophils Absolute: 0 10*3/uL (ref 0.0–0.5)
Eosinophils Relative: 0 %
HCT: 35.9 % — ABNORMAL LOW (ref 36.0–46.0)
Hemoglobin: 12.1 g/dL (ref 12.0–15.0)
Immature Granulocytes: 4 %
Lymphocytes Relative: 10 %
Lymphs Abs: 1.4 10*3/uL (ref 0.7–4.0)
MCH: 30.2 pg (ref 26.0–34.0)
MCHC: 33.7 g/dL (ref 30.0–36.0)
MCV: 89.5 fL (ref 80.0–100.0)
Monocytes Absolute: 0.3 10*3/uL (ref 0.1–1.0)
Monocytes Relative: 2 %
Neutro Abs: 12 10*3/uL — ABNORMAL HIGH (ref 1.7–7.7)
Neutrophils Relative %: 84 %
Platelets: 257 10*3/uL (ref 150–400)
RBC: 4.01 MIL/uL (ref 3.87–5.11)
RDW: 13 % (ref 11.5–15.5)
WBC: 14.2 10*3/uL — ABNORMAL HIGH (ref 4.0–10.5)
nRBC: 0.1 % (ref 0.0–0.2)

## 2023-05-08 LAB — BRAIN NATRIURETIC PEPTIDE: B Natriuretic Peptide: 37.8 pg/mL (ref 0.0–100.0)

## 2023-05-08 LAB — CULTURE, RESPIRATORY W GRAM STAIN

## 2023-05-08 LAB — PROCALCITONIN: Procalcitonin: <0.1 ng/mL

## 2023-05-08 LAB — C-REACTIVE PROTEIN: CRP: 0.5 mg/dL (ref <1.0)

## 2023-05-08 LAB — MAGNESIUM: Magnesium: 1.8 mg/dL (ref 1.7–2.4)

## 2023-05-08 MED ORDER — LOSARTAN POTASSIUM 50 MG PO TABS
50.0000 mg | ORAL_TABLET | Freq: Every day | ORAL | Status: DC
  Administered 2023-05-08 – 2023-05-11 (×4): 50 mg via ORAL
  Filled 2023-05-08 (×4): qty 1

## 2023-05-08 NOTE — Progress Notes (Signed)
PROGRESS NOTE                                                                                                                                                                                                             Patient Demographics:    Mary Chase, is a 88 y.o. female, DOB - 09-11-1935, [id number]:[id number]  Outpatient Primary MD for the patient is Mary Rua, MD    LOS - 4  Admit date - 05/03/2023    Chief Complaint  Patient presents with   Shortness of Breath       Brief Narrative (HPI from H&P)      88 y.o. female with medical history significant of asthma, COPD, GERD, hypertension, sickle cell trait presenting with of shortness of breath, present at rest worse with exertion, no orthopnea, no fever chills or sick contacts.  She has history of asthma and 1-2 2 times a year it acts up quite bad according to the patient.  She was brought from home by EMS upon arrival to the ED she was found to be severely hypoxic and placed on BiPAP.  She was admitted for acute hypoxic respiratory failure, asthma exacerbation caused by parainfluenza virus infection.   Subjective:    [name] significant event yesterday where she had significant wheezing and increased work of breathing bilaterally, which has improved after increasing her steroids.      Assessment  & Plan :   Acute hypoxic respiratory failure due to severe asthma exacerbation caused by parainfluenza viral infection.  -CCM input greatly appreciated -Required BiPAP initially, BiPAP requirement for few days, continue with oxygen for now, wean as tolerated. -She has been improving on IV Solu-Medrol taper, but yesterday she had significant dyspnea, wheezing and increased work of breathing, this has improved after increasing her IV Solu-Medrol to 125 mg IV twice daily, will continue for today and decrease tomorrow back to 40 mg IV twice daily -continue with  DuoNebs, Dulera, Singulair and Claritin.  Essential hypertension.  -lisinopril on hold given her cough and wheezing, will start on losartan today instead . -Continue with amlodipine .  GERD.  Placed on PPI.  History of sickle cell trait.  Monitor.      Condition - Extremely Guarded  Family Communication  :  daughter bedside daily  Code Status :  Full  Consults  :  PCCM  PUD Prophylaxis : PPI   Procedures  :            Disposition Plan  :    Status is: inpatient  DVT Prophylaxis  :    enoxaparin (LOVENOX) injection 40 mg Start: 05/03/23 1500    Lab Results  Component Value Date   PLT 257 05/08/2023    Diet :  Diet Order             Diet regular Fluid consistency: Thin  Diet effective now                    Inpatient Medications  Scheduled Meds:  amLODipine  10 mg Oral Daily   enoxaparin (LOVENOX) injection  40 mg Subcutaneous Q24H   ipratropium-albuterol  3 mL Nebulization Q4H   loratadine  10 mg Oral Daily   losartan  50 mg Oral Daily   methylPREDNISolone (SOLU-MEDROL) injection  125 mg Intravenous Q12H   mometasone-formoterol  2 puff Inhalation BID   montelukast  10 mg Oral Daily   pantoprazole  40 mg Oral Daily   sodium chloride flush  3 mL Intravenous Q12H   Continuous Infusions: PRN Meds:.acetaminophen **OR** acetaminophen, albuterol, polyethylene glycol  Antibiotics  :    Anti-infectives (From admission, onward)    None         Objective:   Vitals:   05/08/23 0200 05/08/23 0403 05/08/23 0837 05/08/23 0959  BP:    (!) 141/129  Pulse: 100 (!) 107    Resp: 17 14    Temp:    (!) 97.4 F (36.3 C)  TempSrc:    Oral  SpO2: 98% 98% 95% 99%  Weight:      Height:        Wt Readings from Last 3 Encounters:  05/03/23 72.1 kg  11/26/22 73 kg  11/18/22 74.8 kg    No intake or output data in the 24 hours ending 05/08/23 1247    Physical Exam  Awake Alert, Oriented X 3, No new F.N deficits, Normal affect Symmetrical  Chest wall movement, diffuse wheezing bilaterally, but much improved. RRR,No Gallops,Rubs or new Murmurs, No Parasternal Heave +ve B.Sounds, Abd Soft, No tenderness, No rebound - guarding or rigidity. No Cyanosis, Clubbing or edema, No new Rash or bruise       Data Review:    Recent Labs  Lab 05/04/23 0611 05/05/23 0746 05/06/23 0411 05/07/23 0631 05/08/23 0346  WBC 10.6* 13.9* 14.3* 13.0* 14.2*  HGB 11.7* 12.0 11.3* 11.1* 12.1  HCT 34.8* 36.0 33.7* 32.9* 35.9*  PLT 184 214 205 224 257  MCV 91.8 92.5 93.9 90.6 89.5  MCH 30.9 30.8 31.5 30.6 30.2  MCHC 33.6 33.3 33.5 33.7 33.7  RDW 13.1 13.1 12.9 12.9 13.0  LYMPHSABS  --  1.6 1.3 1.3 1.4  MONOABS  --  0.5 0.5 0.5 0.3  EOSABS  --  0.0 0.0 0.0 0.0  BASOSABS  --  0.0 0.0 0.0 0.1    Recent Labs  Lab 05/04/23 0611 05/05/23 0746 05/06/23 0411 05/07/23 0631 05/08/23 0346  NA 135 135 135 136 135  K 3.9 4.4 4.2 4.4 4.5  CL 102 101 103 103 101  CO2 21* 25 24 25 25   ANIONGAP 12 9 8 8 9   GLUCOSE 164* 150* 184* 168* 268*  BUN 18 21 22 17 16   CREATININE 1.16* 1.15* 1.04* 0.98 1.05*  AST 20  --   --   --   --  ALT 10  --   --   --   --   ALKPHOS 83  --   --   --   --   BILITOT 0.4  --   --   --   --   ALBUMIN 3.5  --   --   --   --   CRP 4.7* 1.7* 0.7 0.6 0.5  PROCALCITON 0.24 0.14 <0.10 <0.10 <0.10  BNP 78.5 70.9 38.3 36.3 37.8  MG 2.1 2.2 2.1 1.9 1.8  CALCIUM 8.8* 9.1 8.8* 9.0 9.1      Recent Labs  Lab 05/04/23 0611 05/05/23 0746 05/06/23 0411 05/07/23 0631 05/08/23 0346  CRP 4.7* 1.7* 0.7 0.6 0.5  PROCALCITON 0.24 0.14 <0.10 <0.10 <0.10  BNP 78.5 70.9 38.3 36.3 37.8  MG 2.1 2.2 2.1 1.9 1.8  CALCIUM 8.8* 9.1 8.8* 9.0 9.1    Recent Labs  Lab 05/04/23 0611 05/05/23 0746 05/06/23 0411 05/07/23 0631 05/08/23 0346  WBC 10.6* 13.9* 14.3* 13.0* 14.2*  PLT 184 214 205 224 257  CRP 4.7* 1.7* 0.7 0.6 0.5  PROCALCITON 0.24 0.14 <0.10 <0.10 <0.10  CREATININE 1.16* 1.15* 1.04* 0.98 1.05*     ------------------------------------------------------------------------------------------------------------------ No results found for: "CHOL", "HDL", "LDLCALC", "LDLDIRECT", "TRIG", "CHOLHDL"  Lab Results  Component Value Date   HGBA1C 5.4 09/10/2020    No results for input(s): "TSH", "T4TOTAL", "T3FREE", "THYROIDAB" in the last 72 hours.  Invalid input(s): "FREET3" ------------------------------------------------------------------------------------------------------------------ Cardiac Enzymes No results for input(s): "CKMB", "TROPONINI", "MYOGLOBIN" in the last 168 hours.  Invalid input(s): "CK"  Micro Results Recent Results (from the past 240 hour(s))  Respiratory (~20 pathogens) panel by PCR     Status: Abnormal   Collection Time: 05/03/23 10:06 PM   Specimen: Nasopharyngeal Swab; Respiratory  Result Value Ref Range Status   Adenovirus NOT DETECTED NOT DETECTED Final   Coronavirus 229E NOT DETECTED NOT DETECTED Final    Comment: (NOTE) The Coronavirus on the Respiratory Panel, DOES NOT test for the novel  Coronavirus (2019 nCoV)    Coronavirus HKU1 NOT DETECTED NOT DETECTED Final   Coronavirus NL63 NOT DETECTED NOT DETECTED Final   Coronavirus OC43 NOT DETECTED NOT DETECTED Final   Metapneumovirus NOT DETECTED NOT DETECTED Final   Rhinovirus / Enterovirus NOT DETECTED NOT DETECTED Final   Influenza A NOT DETECTED NOT DETECTED Final   Influenza B NOT DETECTED NOT DETECTED Final   Parainfluenza Virus 1 NOT DETECTED NOT DETECTED Final   Parainfluenza Virus 2 NOT DETECTED NOT DETECTED Final   Parainfluenza Virus 3 DETECTED (A) NOT DETECTED Final   Parainfluenza Virus 4 NOT DETECTED NOT DETECTED Final   Respiratory Syncytial Virus NOT DETECTED NOT DETECTED Final   Bordetella pertussis NOT DETECTED NOT DETECTED Final   Bordetella Parapertussis NOT DETECTED NOT DETECTED Final   Chlamydophila pneumoniae NOT DETECTED NOT DETECTED Final   Mycoplasma pneumoniae NOT  DETECTED NOT DETECTED Final    Comment: Performed at [facility], [address], [place], [place] [address]  Expectorated Sputum Assessment w Gram Stain, Rflx to Resp Cult     Status: None   Collection Time: 05/04/23 12:09 PM   Specimen: Expectorated Sputum  Result Value Ref Range Status   Specimen Description EXPECTORATED SPUTUM  Final   Special Requests NONE  Final   Sputum evaluation   Final    THIS SPECIMEN IS ACCEPTABLE FOR SPUTUM CULTURE Performed at [facility], [address] [place], [place] [address]    Report Status 05/04/2023  FINAL  Final  Culture, Respiratory w Gram Stain     Status: None   Collection Time: 05/04/23 12:09 PM  Result Value Ref Range Status   Specimen Description EXPECTORATED SPUTUM  Final   Special Requests NONE Reflexed from [id number]  Final   Gram Stain   Final    ABUNDANT WBC PRESENT, PREDOMINANTLY PMN ABUNDANT GRAM POSITIVE COCCI IN PAIRS IN CHAINS    Culture   Final    ABUNDANT Normal respiratory flora-no Staph aureus or Pseudomonas seen Performed at [facility], [address] [place], [place] [address]    Report Status 05/08/2023 FINAL  Final    Radiology Reports No results found.    Signature  -   Huey Bienenstock M.D on 05/08/2023 at 12:47 PM   -  To page go to [email or web address]

## 2023-05-08 NOTE — Progress Notes (Signed)
Pt has PRN bipap oders, no distress noted at this time.  

## 2023-05-09 DIAGNOSIS — J9601 Acute respiratory failure with hypoxia: Secondary | ICD-10-CM | POA: Diagnosis not present

## 2023-05-09 LAB — BASIC METABOLIC PANEL
Anion gap: 11 (ref 5–15)
BUN: 19 mg/dL (ref 8–23)
CO2: 23 mmol/L (ref 22–32)
Calcium: 8.9 mg/dL (ref 8.9–10.3)
Chloride: 100 mmol/L (ref 98–111)
Creatinine, Ser: 1.1 mg/dL — ABNORMAL HIGH (ref 0.44–1.00)
GFR, Estimated: 49 mL/min — ABNORMAL LOW (ref >60)
Glucose, Bld: 250 mg/dL — ABNORMAL HIGH (ref 70–99)
Potassium: 4.4 mmol/L (ref 3.5–5.1)
Sodium: 134 mmol/L — ABNORMAL LOW (ref 135–145)

## 2023-05-09 LAB — CBC WITH DIFFERENTIAL/PLATELET
Abs Immature Granulocytes: 0.75 10*3/uL — ABNORMAL HIGH (ref 0.00–0.07)
Basophils Absolute: 0.1 10*3/uL (ref 0.0–0.1)
Basophils Relative: 0 %
Eosinophils Absolute: 0 10*3/uL (ref 0.0–0.5)
Eosinophils Relative: 0 %
HCT: 35.9 % — ABNORMAL LOW (ref 36.0–46.0)
Hemoglobin: 12.3 g/dL (ref 12.0–15.0)
Immature Granulocytes: 5 %
Lymphocytes Relative: 10 %
Lymphs Abs: 1.6 10*3/uL (ref 0.7–4.0)
MCH: 30.8 pg (ref 26.0–34.0)
MCHC: 34.3 g/dL (ref 30.0–36.0)
MCV: 89.8 fL (ref 80.0–100.0)
Monocytes Absolute: 0.4 10*3/uL (ref 0.1–1.0)
Monocytes Relative: 3 %
Neutro Abs: 13.7 10*3/uL — ABNORMAL HIGH (ref 1.7–7.7)
Neutrophils Relative %: 82 %
Platelets: 256 10*3/uL (ref 150–400)
RBC: 4 MIL/uL (ref 3.87–5.11)
RDW: 12.9 % (ref 11.5–15.5)
WBC: 16.6 10*3/uL — ABNORMAL HIGH (ref 4.0–10.5)
nRBC: 0.4 % — ABNORMAL HIGH (ref 0.0–0.2)

## 2023-05-09 LAB — HEMOGLOBIN A1C
Hgb A1c MFr Bld: 5.7 % — ABNORMAL HIGH (ref 4.8–5.6)
Mean Plasma Glucose: 116.89 mg/dL

## 2023-05-09 LAB — PROCALCITONIN: Procalcitonin: <0.1 ng/mL

## 2023-05-09 LAB — GLUCOSE, CAPILLARY
Glucose-Capillary: 222 mg/dL — ABNORMAL HIGH (ref 70–99)
Glucose-Capillary: 158 mg/dL — ABNORMAL HIGH (ref 70–99)

## 2023-05-09 MED ORDER — INSULIN ASPART 100 UNIT/ML IJ SOLN
0.0000 [IU] | Freq: Three times a day (TID) | INTRAMUSCULAR | Status: DC
  Administered 2023-05-09: 3 [IU] via SUBCUTANEOUS
  Administered 2023-05-10 – 2023-05-11 (×3): 2 [IU] via SUBCUTANEOUS

## 2023-05-09 MED ORDER — METHYLPREDNISOLONE SODIUM SUCC 40 MG IJ SOLR
40.0000 mg | Freq: Two times a day (BID) | INTRAMUSCULAR | Status: DC
  Administered 2023-05-09 – 2023-05-11 (×5): 40 mg via INTRAVENOUS
  Filled 2023-05-09 (×4): qty 1

## 2023-05-09 MED ORDER — INSULIN ASPART 100 UNIT/ML IJ SOLN
0.0000 [IU] | Freq: Every day | INTRAMUSCULAR | Status: DC
  Administered 2023-05-10: 4 [IU] via SUBCUTANEOUS

## 2023-05-09 NOTE — Progress Notes (Addendum)
PROGRESS NOTE                                                                                                                                                                                                             Patient Demographics:    Mary Chase, is a 88 y.o. female, DOB - 01/28/1935, ZOX:[id number]  Outpatient Primary MD for the patient is Joycelyn Rua, MD    LOS - 5  Admit date - 05/03/2023    Chief Complaint  Patient presents with   Shortness of Breath       Brief Narrative (HPI from H&P)      88 y.o. female with medical history significant of asthma, COPD, GERD, hypertension, sickle cell trait presenting with of shortness of breath, present at rest worse with exertion, no orthopnea, no fever chills or sick contacts.  She has history of asthma and 1-2 2 times a year it acts up quite bad according to the patient.  She was brought from home by EMS upon arrival to the ED she was found to be severely hypoxic and placed on BiPAP.  She was admitted for acute hypoxic respiratory failure, asthma exacerbation caused by parainfluenza virus infection.   Subjective:    [name] orts she is feeling much better today, dyspnea and cough much improved.     Assessment  & Plan :   Acute hypoxic respiratory failure due to severe asthma exacerbation caused by parainfluenza viral infection.  -PCCM input greatly appreciated -Required BiPAP initially, BiPAP requirement for few days, continue with oxygen for now, wean as tolerated. -She has been improving on IV Solu-Medrol taper, but she had significant dyspnea, wheezing and increased work of breathing, this has improved after increasing her IV Solu-Medrol to 125 mg IV twice daily, proved this morning, will continue to decrease steroid to Solu-Medrol back to 40 mg IV twice daily.  -continue with DuoNebs, Dulera, Singulair and Claritin.  Essential hypertension.   -lisinopril on hold given her cough and wheezing, started on losartan  instead . -Continue with amlodipine .  GERD.  Placed on PPI.  History of sickle cell trait.  Monitor.  Hyperglycemia due to steroids -Will start on insulin sliding scale      Condition - Extremely Guarded  Family Communication  :  daughter bedside daily  Code Status :  Full  Consults  :  PCCM  PUD Prophylaxis : PPI   Procedures  :            Disposition Plan  :    Status is: inpatient  DVT Prophylaxis  :    enoxaparin (LOVENOX) injection 40 mg Start: 05/03/23 1500    Lab Results  Component Value Date   PLT 256 05/09/2023    Diet :  Diet Order             Diet regular Fluid consistency: Thin  Diet effective now                    Inpatient Medications  Scheduled Meds:  amLODipine  10 mg Oral Daily   enoxaparin (LOVENOX) injection  40 mg Subcutaneous Q24H   ipratropium-albuterol  3 mL Nebulization Q4H   loratadine  10 mg Oral Daily   losartan  50 mg Oral Daily   methylPREDNISolone (SOLU-MEDROL) injection  40 mg Intravenous Q12H   mometasone-formoterol  2 puff Inhalation BID   montelukast  10 mg Oral Daily   pantoprazole  40 mg Oral Daily   sodium chloride flush  3 mL Intravenous Q12H   Continuous Infusions: PRN Meds:.acetaminophen **OR** acetaminophen, albuterol, polyethylene glycol  Antibiotics  :    Anti-infectives (From admission, onward)    None         Objective:   Vitals:   05/09/23 0319 05/09/23 0355 05/09/23 1010 05/09/23 1200  BP:  (!) 141/81 126/80 (!) 156/82  Pulse:  100 (!) 106 (!) 105  Resp:  17 15 20   Temp:  98.5 F (36.9 C) 98.7 F (37.1 C) 99.1 F (37.3 C)  TempSrc:  Oral Oral Oral  SpO2: 98%  96% 94%  Weight:      Height:        Wt Readings from Last 3 Encounters:  05/03/23 72.1 kg  11/26/22 73 kg  11/18/22 74.8 kg    No intake or output data in the 24 hours ending 05/09/23 1311    Physical Exam  Awake Alert, Oriented X  3, No new F.N deficits, Normal affect Symmetrical Chest wall movement, air entry, wheezing much improved. RRR,No Gallops,Rubs or new Murmurs, No Parasternal Heave +ve B.Sounds, Abd Soft, No tenderness, No rebound - guarding or rigidity. No Cyanosis, Clubbing or edema, No new Rash or bruise        Data Review:    Recent Labs  Lab 05/05/23 0746 05/06/23 0411 05/07/23 0631 05/08/23 0346 05/09/23 0339  WBC 13.9* 14.3* 13.0* 14.2* 16.6*  HGB 12.0 11.3* 11.1* 12.1 12.3  HCT 36.0 33.7* 32.9* 35.9* 35.9*  PLT 214 205 224 257 256  MCV 92.5 93.9 90.6 89.5 89.8  MCH 30.8 31.5 30.6 30.2 30.8  MCHC 33.3 33.5 33.7 33.7 34.3  RDW 13.1 12.9 12.9 13.0 12.9  LYMPHSABS 1.6 1.3 1.3 1.4 1.6  MONOABS 0.5 0.5 0.5 0.3 0.4  EOSABS 0.0 0.0 0.0 0.0 0.0  BASOSABS 0.0 0.0 0.0 0.1 0.1    Recent Labs  Lab 05/04/23 0611 05/05/23 0746 05/06/23 0411 05/07/23 0631 05/08/23 0346 05/09/23 0339  NA 135 135 135 136 135 134*  K 3.9 4.4 4.2 4.4 4.5 4.4  CL 102 101 103 103 101 100  CO2 21* 25 24 25 25 23   ANIONGAP 12 9 8 8 9 11   GLUCOSE 164* 150* 184* 168* 268* 250*  BUN 18 21 22 17 16 19   CREATININE 1.16* 1.15* 1.04* 0.98 1.05* 1.10*  AST 20  --   --   --   --   --   ALT 10  --   --   --   --   --   ALKPHOS 83  --   --   --   --   --   BILITOT 0.4  --   --   --   --   --   ALBUMIN 3.5  --   --   --   --   --   CRP 4.7* 1.7* 0.7 0.6 0.5  --   PROCALCITON 0.24 0.14 <0.10 <0.10 <0.10 <0.10  BNP 78.5 70.9 38.3 36.3 37.8  --   MG 2.1 2.2 2.1 1.9 1.8  --   CALCIUM 8.8* 9.1 8.8* 9.0 9.1 8.9      Recent Labs  Lab 05/04/23 0611 05/05/23 0746 05/06/23 0411 05/07/23 0631 05/08/23 0346 05/09/23 0339  CRP 4.7* 1.7* 0.7 0.6 0.5  --   PROCALCITON 0.24 0.14 <0.10 <0.10 <0.10 <0.10  BNP 78.5 70.9 38.3 36.3 37.8  --   MG 2.1 2.2 2.1 1.9 1.8  --   CALCIUM 8.8* 9.1 8.8* 9.0 9.1 8.9    Recent Labs  Lab 05/04/23 0611 05/05/23 0746 05/06/23 0411 05/07/23 0631 05/08/23 0346 05/09/23 0339  WBC  10.6* 13.9* 14.3* 13.0* 14.2* 16.6*  PLT 184 214 205 224 257 256  CRP 4.7* 1.7* 0.7 0.6 0.5  --   PROCALCITON 0.24 0.14 <0.10 <0.10 <0.10 <0.10  CREATININE 1.16* 1.15* 1.04* 0.98 1.05* 1.10*    ------------------------------------------------------------------------------------------------------------------ No results found for: "CHOL", "HDL", "LDLCALC", "LDLDIRECT", "TRIG", "CHOLHDL"  Lab Results  Component Value Date   HGBA1C 5.4 09/10/2020    No results for input(s): "TSH", "T4TOTAL", "T3FREE", "THYROIDAB" in the last 72 hours.  Invalid input(s): "FREET3" ------------------------------------------------------------------------------------------------------------------ Cardiac Enzymes No results for input(s): "CKMB", "TROPONINI", "MYOGLOBIN" in the last 168 hours.  Invalid input(s): "CK"  Micro Results Recent Results (from the past 240 hour(s))  Respiratory (~20 pathogens) panel by PCR     Status: Abnormal   Collection Time: 05/03/23 10:06 PM   Specimen: Nasopharyngeal Swab; Respiratory  Result Value Ref Range Status   Adenovirus NOT DETECTED NOT DETECTED Final   Coronavirus 229E NOT DETECTED NOT DETECTED Final    Comment: (NOTE) The Coronavirus on the Respiratory Panel, DOES NOT test for the novel  Coronavirus (2019 nCoV)    Coronavirus HKU1 NOT DETECTED NOT DETECTED Final   Coronavirus NL63 NOT DETECTED NOT DETECTED Final   Coronavirus OC43 NOT DETECTED NOT DETECTED Final   Metapneumovirus NOT DETECTED NOT DETECTED Final   Rhinovirus / Enterovirus NOT DETECTED NOT DETECTED Final   Influenza A NOT DETECTED NOT DETECTED Final   Influenza B NOT DETECTED NOT DETECTED Final   Parainfluenza Virus 1 NOT DETECTED NOT DETECTED Final   Parainfluenza Virus 2 NOT DETECTED NOT DETECTED Final   Parainfluenza Virus 3 DETECTED (A) NOT DETECTED Final   Parainfluenza Virus 4 NOT DETECTED NOT DETECTED Final   Respiratory Syncytial Virus NOT DETECTED NOT DETECTED Final   Bordetella  pertussis NOT DETECTED NOT DETECTED Final   Bordetella Parapertussis NOT DETECTED NOT DETECTED Final   Chlamydophila pneumoniae NOT DETECTED NOT DETECTED Final   Mycoplasma pneumoniae NOT DETECTED NOT DETECTED Final    Comment: Performed at [facility], [address], [place], [place] [address]  Expectorated Sputum Assessment w Gram Stain, Rflx to Resp Cult     Status: None   Collection Time: 05/04/23 12:09  PM   Specimen: Expectorated Sputum  Result Value Ref Range Status   Specimen Description EXPECTORATED SPUTUM  Final   Special Requests NONE  Final   Sputum evaluation   Final    THIS SPECIMEN IS ACCEPTABLE FOR SPUTUM CULTURE Performed at [facility], [address] [place], [place] [address]    Report Status 05/04/2023 FINAL  Final  Culture, Respiratory w Gram Stain     Status: None   Collection Time: 05/04/23 12:09 PM  Result Value Ref Range Status   Specimen Description EXPECTORATED SPUTUM  Final   Special Requests NONE Reflexed from [id number]  Final   Gram Stain   Final    ABUNDANT WBC PRESENT, PREDOMINANTLY PMN ABUNDANT GRAM POSITIVE COCCI IN PAIRS IN CHAINS    Culture   Final    ABUNDANT Normal respiratory flora-no Staph aureus or Pseudomonas seen Performed at [facility], [address], [place], [place] [address]    Report Status 05/08/2023 FINAL  Final    Radiology Reports No results found.    Signature  -   Huey Bienenstock M.D on 05/09/2023 at 1:11 PM   -  To page go to [email or web address]

## 2023-05-09 NOTE — Progress Notes (Signed)
Mobility Specialist: Progress Note   05/09/23 1538  Mobility  Activity Refused mobility  Mobility Specialist Start Time (ACUTE ONLY) 1512  Mobility Specialist Stop Time (ACUTE ONLY) 1538  Mobility Specialist Time Calculation (min) (ACUTE ONLY) 26 min   Pt refused mobility stating she recently ambulated in the room and was feeling tired. Pt states she has been getting up to use the BR today as well. Will f/u as able.   Ladonte Verstraete Mobility Specialist Please contact via SecureChat or Rehab office at [phone]

## 2023-05-10 DIAGNOSIS — I10 Essential (primary) hypertension: Secondary | ICD-10-CM | POA: Diagnosis not present

## 2023-05-10 DIAGNOSIS — J9601 Acute respiratory failure with hypoxia: Secondary | ICD-10-CM | POA: Diagnosis not present

## 2023-05-10 LAB — CBC WITH DIFFERENTIAL/PLATELET
Abs Immature Granulocytes: 0.98 10*3/uL — ABNORMAL HIGH (ref 0.00–0.07)
Basophils Absolute: 0.1 10*3/uL (ref 0.0–0.1)
Basophils Relative: 1 %
Eosinophils Absolute: 0 10*3/uL (ref 0.0–0.5)
Eosinophils Relative: 0 %
HCT: 36.6 % (ref 36.0–46.0)
Hemoglobin: 12.4 g/dL (ref 12.0–15.0)
Immature Granulocytes: 5 %
Lymphocytes Relative: 8 %
Lymphs Abs: 1.6 10*3/uL (ref 0.7–4.0)
MCH: 31.2 pg (ref 26.0–34.0)
MCHC: 33.9 g/dL (ref 30.0–36.0)
MCV: 92.2 fL (ref 80.0–100.0)
Monocytes Absolute: 0.8 10*3/uL (ref 0.1–1.0)
Monocytes Relative: 4 %
Neutro Abs: 16.6 10*3/uL — ABNORMAL HIGH (ref 1.7–7.7)
Neutrophils Relative %: 82 %
Platelets: 281 10*3/uL (ref 150–400)
RBC: 3.97 MIL/uL (ref 3.87–5.11)
RDW: 13.1 % (ref 11.5–15.5)
WBC: 20.1 10*3/uL — ABNORMAL HIGH (ref 4.0–10.5)
nRBC: 0.1 % (ref 0.0–0.2)

## 2023-05-10 LAB — BASIC METABOLIC PANEL
Anion gap: 9 (ref 5–15)
BUN: 22 mg/dL (ref 8–23)
CO2: 28 mmol/L (ref 22–32)
Calcium: 9 mg/dL (ref 8.9–10.3)
Chloride: 98 mmol/L (ref 98–111)
Creatinine, Ser: 1.1 mg/dL — ABNORMAL HIGH (ref 0.44–1.00)
GFR, Estimated: 49 mL/min — ABNORMAL LOW (ref >60)
Glucose, Bld: 246 mg/dL — ABNORMAL HIGH (ref 70–99)
Potassium: 4.6 mmol/L (ref 3.5–5.1)
Sodium: 135 mmol/L (ref 135–145)

## 2023-05-10 LAB — GLUCOSE, CAPILLARY
Glucose-Capillary: 199 mg/dL — ABNORMAL HIGH (ref 70–99)
Glucose-Capillary: 178 mg/dL — ABNORMAL HIGH (ref 70–99)
Glucose-Capillary: 193 mg/dL — ABNORMAL HIGH (ref 70–99)
Glucose-Capillary: 343 mg/dL — ABNORMAL HIGH (ref 70–99)

## 2023-05-10 LAB — MAGNESIUM: Magnesium: 1.7 mg/dL (ref 1.7–2.4)

## 2023-05-10 LAB — PHOSPHORUS: Phosphorus: 2.4 mg/dL — ABNORMAL LOW (ref 2.5–4.6)

## 2023-05-10 NOTE — Progress Notes (Signed)
Physical Therapy Treatment Patient Details Name: Mary Chase MRN: [id number] DOB: 1935-11-06 Today's Date: 05/10/2023   History of Present Illness Mary Chase is a 88 y.o. female presenting to [facility] on 5/13 for shortness of breath. Chest x-ray stable. PMHx: asthma, COPD, GERD, hypertension, sickle cell trait.    PT Comments    Pt with continued progress towards acute goals this session progressing gait in hall with rollator support and min guard for safety for 117' total with seated rest break as pt continues to fatigue quickly. Pt requiring cues throughout gait for upright posture, safety awareness, pacing and breathing techniques. Pt educated on energy conservation techniques and appropriate activity progression with pt and p daughter verbalizing understanding. Pt continues to benefit from skilled PT services to progress toward functional mobility goals.    Recommendations for follow up therapy are one component of a multi-disciplinary discharge planning process, led by the attending physician.  Recommendations may be updated based on patient status, additional functional criteria and insurance authorization.  Follow Up Recommendations       Assistance Recommended at Discharge Intermittent Supervision/Assistance  Patient can return home with the following A little help with walking and/or transfers;Help with stairs or ramp for entrance;Assist for transportation;Assistance with cooking/housework   Equipment Recommendations  None recommended by PT    Recommendations for Other Services       Precautions / Restrictions Precautions Precautions: Fall;Other (comment) Precaution Comments: watch HR and O2 Restrictions Weight Bearing Restrictions: No     Mobility  Bed Mobility Overal bed mobility: Modified Independent             General bed mobility comments: up OOB on arrival    Transfers Overall transfer level: Needs assistance Equipment used: Rollator (4  wheels) Transfers: Sit to/from Stand Sit to Stand: Min guard           General transfer comment: min G for safety, cues for locking rollator brakes    Ambulation/Gait Ambulation/Gait assistance: Min guard Gait Distance (Feet): 51 Feet (+ 66') Assistive device: Rolling walker (2 wheels) Gait Pattern/deviations: Step-through pattern, Trunk flexed, Decreased stride length Gait velocity: decr     General Gait Details: improved posture and gait speed with rollator, cues for breathing techniques and pacing   Stairs             Wheelchair Mobility    Modified Rankin (Stroke Patients Only)       Balance Overall balance assessment: Modified Independent                                          Cognition Arousal/Alertness: Awake/alert Behavior During Therapy: WFL for tasks assessed/performed Overall Cognitive Status: Within Functional Limits for tasks assessed                                 General Comments: A&Ox4, daughter present at bedside        Exercises Other Exercises Other Exercises: reviewed seated marching and LAQ with pt able to return demo    General Comments General comments (skin integrity, edema, etc.): pt motivated to improve, educated on energy conservation techniques, HEP, increasing activity slowy and activity recommendations      Pertinent Vitals/Pain Pain Assessment Pain Assessment: No/denies pain    Home Living  Prior Function            PT Goals (current goals can now be found in the care plan section) Acute Rehab PT Goals Patient Stated Goal: to be able to walk to mailbox at home PT Goal Formulation: With patient Time For Goal Achievement: 05/18/23 Progress towards PT goals: Progressing toward goals    Frequency    Min 3X/week      PT Plan Current plan remains appropriate    Co-evaluation              AM-PAC PT "6 Clicks" Mobility   Outcome  Measure  Help needed turning from your back to your side while in a flat bed without using bedrails?: None Help needed moving from lying on your back to sitting on the side of a flat bed without using bedrails?: None Help needed moving to and from a bed to a chair (including a wheelchair)?: A Little Help needed standing up from a chair using your arms (e.g., wheelchair or bedside chair)?: A Little Help needed to walk in hospital room?: A Little Help needed climbing 3-5 steps with a railing? : A Lot 6 Click Score: 19    End of Session Equipment Utilized During Treatment: Oxygen Activity Tolerance: Patient limited by fatigue Patient left: in chair;with call bell/phone within reach;with family/visitor present Nurse Communication: Mobility status PT Visit Diagnosis: Other abnormalities of gait and mobility (R26.89);Difficulty in walking, not elsewhere classified (R26.2)     Time: 1610-9604 PT Time Calculation (min) (ACUTE ONLY): 25 min  Charges:  $Gait Training: 8-22 mins $Therapeutic Activity: 8-22 mins                     Teryl Mcconaghy R. PTA Acute Rehabilitation Services Office: [phone]   Catalina Antigua 05/10/2023, 3:20 PM

## 2023-05-10 NOTE — Progress Notes (Signed)
Patient has bipap at bedside for PRN use. Does not want to wear at this time. No distress noted. Breathing treatments given.

## 2023-05-10 NOTE — Progress Notes (Signed)
PROGRESS NOTE                                                                                                                                                                                                             Patient Demographics:    Mary Chase, is a 88 y.o. female, DOB - [birth year], ZOX:[id number]  Outpatient Primary MD for the patient is Joycelyn Rua, MD    LOS - 6  Admit date - 05/03/2023    Chief Complaint  Patient presents with   Shortness of Breath       Brief Narrative (HPI from H&P)      88 y.o. female with medical history significant of asthma, COPD, GERD, hypertension, sickle cell trait presenting with of shortness of breath, present at rest worse with exertion, no orthopnea, no fever chills or sick contacts.  She has history of asthma and 1-2 2 times a year it acts up quite bad according to the patient.  She was brought from home by EMS upon arrival to the ED she was found to be severely hypoxic and placed on BiPAP.  She was admitted for acute hypoxic respiratory failure, asthma exacerbation caused by parainfluenza virus infection.   Subjective:    Mary Chase reports dyspnea and cough much improved   Assessment  & Plan :   Acute hypoxic respiratory failure due to severe asthma exacerbation caused by parainfluenza viral infection.  -PCCM input greatly appreciated -Required BiPAP initially, BiPAP requirement for few days, continue with oxygen for now, wean as tolerated. -Continue with IV Solu-Medrol, had to increase over the weekend due to significant wheezing and asthma flareup, but this appears to be improving currently, continue with IV Solu-Medrol low-dose today, and transition to p.o. prednisone in a.m. -continue with DuoNebs, Dulera, Singulair and Claritin.  Essential hypertension.  -lisinopril on hold given her cough and wheezing, started on losartan, continue with 10 mg of amlodipine  as well. .  GERD.  Placed on PPI.  History of sickle cell trait.  Monitor.  Hyperglycemia due to steroids -on insulin sliding scale      Condition - Extremely Guarded  Family Communication  :  daughter bedside daily  Code Status :  Full  Consults  :  PCCM  PUD Prophylaxis : PPI   Procedures  :  Disposition Plan  :    Status is: inpatient  DVT Prophylaxis  :    enoxaparin (LOVENOX) injection 40 mg Start: 05/03/23 1500    Lab Results  Component Value Date   PLT 281 05/10/2023    Diet :  Diet Order             Diet regular Fluid consistency: Thin  Diet effective now                    Inpatient Medications  Scheduled Meds:  amLODipine  10 mg Oral Daily   enoxaparin (LOVENOX) injection  40 mg Subcutaneous Q24H   insulin aspart  0-5 Units Subcutaneous QHS   insulin aspart  0-9 Units Subcutaneous TID WC   ipratropium-albuterol  3 mL Nebulization Q4H   loratadine  10 mg Oral Daily   losartan  50 mg Oral Daily   methylPREDNISolone (SOLU-MEDROL) injection  40 mg Intravenous Q12H   mometasone-formoterol  2 puff Inhalation BID   montelukast  10 mg Oral Daily   pantoprazole  40 mg Oral Daily   sodium chloride flush  3 mL Intravenous Q12H   Continuous Infusions: PRN Meds:.acetaminophen **OR** acetaminophen, albuterol, polyethylene glycol  Antibiotics  :    Anti-infectives (From admission, onward)    None         Objective:   Vitals:   05/10/23 0300 05/10/23 0740 05/10/23 0800 05/10/23 1121  BP:   137/88   Pulse:   (!) 105   Resp:      Temp:   98.1 F (36.7 C)   TempSrc:   Oral   SpO2: 99% 97% 99% 95%  Weight:      Height:        Wt Readings from Last 3 Encounters:  05/03/23 72.1 kg  11/26/22 73 kg  11/18/22 74.8 kg     Intake/Output Summary (Last 24 hours) at 05/10/2023 1300 Last data filed at 05/10/2023 1100 Gross per 24 hour  Intake 120 ml  Output --  Net 120 ml      Physical Exam  Awake Alert, Oriented  X 3, No new F.N deficits, Normal affect Symmetrical Chest wall movement, end expiratory wheezing, diminished air entry RRR,No Gallops,Rubs or new Murmurs, No Parasternal Heave +ve B.Sounds, Abd Soft, No tenderness, No rebound - guarding or rigidity. No Cyanosis, Clubbing or edema, No new Rash or bruise        Data Review:    Recent Labs  Lab 05/06/23 0411 05/07/23 0631 05/08/23 0346 05/09/23 0339 05/10/23 0623  WBC 14.3* 13.0* 14.2* 16.6* 20.1*  HGB 11.3* 11.1* 12.1 12.3 12.4  HCT 33.7* 32.9* 35.9* 35.9* 36.6  PLT 205 224 257 256 281  MCV 93.9 90.6 89.5 89.8 92.2  MCH 31.5 30.6 30.2 30.8 31.2  MCHC 33.5 33.7 33.7 34.3 33.9  RDW 12.9 12.9 13.0 12.9 13.1  LYMPHSABS 1.3 1.3 1.4 1.6 1.6  MONOABS 0.5 0.5 0.3 0.4 0.8  EOSABS 0.0 0.0 0.0 0.0 0.0  BASOSABS 0.0 0.0 0.1 0.1 0.1    Recent Labs  Lab 05/04/23 0611 05/05/23 0746 05/06/23 0411 05/07/23 0631 05/08/23 0346 05/09/23 0336 05/09/23 0339 05/10/23 0623  NA 135 135 135 136 135  --  134* 135  K 3.9 4.4 4.2 4.4 4.5  --  4.4 4.6  CL 102 101 103 103 101  --  100 98  CO2 21* 25 24 25 25   --  23 28  ANIONGAP 12 9 8 8 9   --  11 9  GLUCOSE 164* 150* 184* 168* 268*  --  250* 246*  BUN 18 21 22 17 16   --  19 22  CREATININE 1.16* 1.15* 1.04* 0.98 1.05*  --  1.10* 1.10*  AST 20  --   --   --   --   --   --   --   ALT 10  --   --   --   --   --   --   --   ALKPHOS 83  --   --   --   --   --   --   --   BILITOT 0.4  --   --   --   --   --   --   --   ALBUMIN 3.5  --   --   --   --   --   --   --   CRP 4.7* 1.7* 0.7 0.6 0.5  --   --   --   PROCALCITON 0.24 0.14 <0.10 <0.10 <0.10  --  <0.10  --   HGBA1C  --   --   --   --   --  5.7*  --   --   BNP 78.5 70.9 38.3 36.3 37.8  --   --   --   MG 2.1 2.2 2.1 1.9 1.8  --   --  1.7  CALCIUM 8.8* 9.1 8.8* 9.0 9.1  --  8.9 9.0      Recent Labs  Lab 05/04/23 0611 05/05/23 0746 05/06/23 0411 05/07/23 0631 05/08/23 0346 05/09/23 0336 05/09/23 0339 05/10/23 0623  CRP 4.7* 1.7*  0.7 0.6 0.5  --   --   --   PROCALCITON 0.24 0.14 <0.10 <0.10 <0.10  --  <0.10  --   HGBA1C  --   --   --   --   --  5.7*  --   --   BNP 78.5 70.9 38.3 36.3 37.8  --   --   --   MG 2.1 2.2 2.1 1.9 1.8  --   --  1.7  CALCIUM 8.8* 9.1 8.8* 9.0 9.1  --  8.9 9.0    Recent Labs  Lab 05/04/23 0611 05/05/23 0746 05/06/23 0411 05/07/23 0631 05/08/23 0346 05/09/23 0339 05/10/23 0623  WBC 10.6* 13.9* 14.3* 13.0* 14.2* 16.6* 20.1*  PLT 184 214 205 224 257 256 281  CRP 4.7* 1.7* 0.7 0.6 0.5  --   --   PROCALCITON 0.24 0.14 <0.10 <0.10 <0.10 <0.10  --   CREATININE 1.16* 1.15* 1.04* 0.98 1.05* 1.10* 1.10*    ------------------------------------------------------------------------------------------------------------------ No results found for: "CHOL", "HDL", "LDLCALC", "LDLDIRECT", "TRIG", "CHOLHDL"  Lab Results  Component Value Date   HGBA1C 5.7 (H) 05/09/2023    No results for input(s): "TSH", "T4TOTAL", "T3FREE", "THYROIDAB" in the last 72 hours.  Invalid input(s): "FREET3" ------------------------------------------------------------------------------------------------------------------ Cardiac Enzymes No results for input(s): "CKMB", "TROPONINI", "MYOGLOBIN" in the last 168 hours.  Invalid input(s): "CK"  Micro Results Recent Results (from the past 240 hour(s))  Respiratory (~20 pathogens) panel by PCR     Status: Abnormal   Collection Time: 05/03/23 10:06 PM   Specimen: Nasopharyngeal Swab; Respiratory  Result Value Ref Range Status   Adenovirus NOT DETECTED NOT DETECTED Final   Coronavirus 229E NOT DETECTED NOT DETECTED Final    Comment: (NOTE) The Coronavirus on the Respiratory Panel, DOES NOT test for the novel  Coronavirus (2019 nCoV)    Coronavirus HKU1 NOT  DETECTED NOT DETECTED Final   Coronavirus NL63 NOT DETECTED NOT DETECTED Final   Coronavirus OC43 NOT DETECTED NOT DETECTED Final   Metapneumovirus NOT DETECTED NOT DETECTED Final   Rhinovirus / Enterovirus NOT  DETECTED NOT DETECTED Final   Influenza A NOT DETECTED NOT DETECTED Final   Influenza B NOT DETECTED NOT DETECTED Final   Parainfluenza Virus 1 NOT DETECTED NOT DETECTED Final   Parainfluenza Virus 2 NOT DETECTED NOT DETECTED Final   Parainfluenza Virus 3 DETECTED (A) NOT DETECTED Final   Parainfluenza Virus 4 NOT DETECTED NOT DETECTED Final   Respiratory Syncytial Virus NOT DETECTED NOT DETECTED Final   Bordetella pertussis NOT DETECTED NOT DETECTED Final   Bordetella Parapertussis NOT DETECTED NOT DETECTED Final   Chlamydophila pneumoniae NOT DETECTED NOT DETECTED Final   Mycoplasma pneumoniae NOT DETECTED NOT DETECTED Final    Comment: Performed at [facility], [address], [place], [place] [address]  Expectorated Sputum Assessment w Gram Stain, Rflx to Resp Cult     Status: None   Collection Time: 05/04/23 12:09 PM   Specimen: Expectorated Sputum  Result Value Ref Range Status   Specimen Description EXPECTORATED SPUTUM  Final   Special Requests NONE  Final   Sputum evaluation   Final    THIS SPECIMEN IS ACCEPTABLE FOR SPUTUM CULTURE Performed at [facility], [address] [place], [place] [address]    Report Status 05/04/2023 FINAL  Final  Culture, Respiratory w Gram Stain     Status: None   Collection Time: 05/04/23 12:09 PM  Result Value Ref Range Status   Specimen Description EXPECTORATED SPUTUM  Final   Special Requests NONE Reflexed from [id number]  Final   Gram Stain   Final    ABUNDANT WBC PRESENT, PREDOMINANTLY PMN ABUNDANT GRAM POSITIVE COCCI IN PAIRS IN CHAINS    Culture   Final    ABUNDANT Normal respiratory flora-no Staph aureus or Pseudomonas seen Performed at [facility], [address], [place], [place] [address]    Report Status 05/08/2023 FINAL  Final    Radiology Reports No results found.    Signature  -   Huey Bienenstock M.D on 05/10/2023 at 1:00 PM   -  To page go to [email or web address]

## 2023-05-10 NOTE — Progress Notes (Signed)
NAME:  Mary Chase, MRN:  [id number], DOB:  02/19/1935, LOS: 6 ADMISSION DATE:  05/03/2023, CONSULTATION DATE:  5/14 REFERRING MD:  Dr. Thedore Mins, CHIEF COMPLAINT:  sob   History of Present Illness:  Patient is a 88 year old female with pertinent PMH asthma, COPD, HTN, GERD, sickle cell trait presents to [facility] ED on 5/13 with SOB.  Patient states they have been having increased WOB since 5/11 with wheezing.  Patient tried home albuterol inhalers without improvement.  On 5/13 patient with increased WOB and also having symptoms of lightheadedness and dizzy.  Upon EMS arrival found to be hypoxic 88% on room air.  Patient given breathing treatments, steroids, mag and transported to [facility] ED.  Upon arrival, RRR low 20s and tachycardia 100-120s.  Patient initially on 4 LNC but then required BiPAP for WOB.  CXR with no acute abnormality.  WBC 11.7 and afebrile.  Troponin and BNP negative.  Patient given more breathing treatments and started on steroids.  RVP showing parainfluenza positive.  Patient admitted to progressive.  On 5/14, patient wore bipap throughout the night. Still hypoxic requiring 5L [place]. PCCM consulted.  Pertinent  Medical History   Past Medical History:  Diagnosis Date   Arthritis    Asthma    Cataract    COPD (chronic obstructive pulmonary disease) (HCC)    Dyspnea    with exertion   GERD (gastroesophageal reflux disease)    Headache    Hypertension    Pneumonia    Pre-diabetes    A1c within normal limits last check   Sickle cell anemia (HCC)    trait   Sickle cell trait (HCC)      Significant Hospital Events: Including procedures, antibiotic start and stop dates in addition to other pertinent events   5/13 admitted w/ asthma/copd exacerbation; admitted to progressive on bipap 5/14 pccm consulted   Interim History / Subjective:  On room air/1L now. Daughter at bedside. Patient is sitting up and feeling much better than admission.  Objective   Blood pressure  137/88, pulse (!) 105, temperature 98.1 F (36.7 C), temperature source Oral, resp. rate 14, height 5' (1.524 m), weight 72.1 kg, SpO2 99 %.       No intake or output data in the 24 hours ending 05/10/23 1029  Filed Weights   05/03/23 1853  Weight: 72.1 kg    Examination: Sitting up at side of bed, no respiratory distress on room air End expiratory wheezes, diminished, symmetric air entry RRR No edema  Resolved Hospital Problem list     Assessment & Plan:  Asthma/COPD exacerbation: on wixela at home Acute respiratory failure w/ hypoxia Parainfluenza virus P: Clinically she is improving from this. She has follow up with [facility] on June 6th.  Appropriate to discharge her on her  home wixela/advair with prn albuterol. It seems she can be weaned to room air. She should also go home on her claritin, montelukast, and PPI. Would discharge her on a prolonged steroid taper prednisone 40 mg daily x 3 days, 30 mg daily x 3 days, 20 mg daily x 3 days, 10 mg daily x 3 days, the discontinue.  There is some suspicion for upper airway obstruction and possible EILO.  PCCM will see as needed. Please call us if we can be of further assistance.    Durel Salts, MD Pulmonary and Critical Care Medicine [facility] 05/10/2023 10:29 AM Pager: see AMION  If no response to pager, please call critical care  on call (see AMION) until 7pm After 7:00 pm call Elink

## 2023-05-10 NOTE — Plan of Care (Signed)

## 2023-05-11 ENCOUNTER — Other Ambulatory Visit (HOSPITAL_COMMUNITY): Payer: Self-pay

## 2023-05-11 DIAGNOSIS — J45901 Unspecified asthma with (acute) exacerbation: Secondary | ICD-10-CM | POA: Diagnosis not present

## 2023-05-11 DIAGNOSIS — J441 Chronic obstructive pulmonary disease with (acute) exacerbation: Secondary | ICD-10-CM

## 2023-05-11 DIAGNOSIS — J9601 Acute respiratory failure with hypoxia: Secondary | ICD-10-CM | POA: Diagnosis not present

## 2023-05-11 LAB — GLUCOSE, CAPILLARY
Glucose-Capillary: 182 mg/dL — ABNORMAL HIGH (ref 70–99)
Glucose-Capillary: 137 mg/dL — ABNORMAL HIGH (ref 70–99)

## 2023-05-11 MED ORDER — LOSARTAN POTASSIUM 50 MG PO TABS
50.0000 mg | ORAL_TABLET | Freq: Every day | ORAL | 0 refills | Status: AC
  Filled 2023-05-11: qty 30, 30d supply, fill #0

## 2023-05-11 MED ORDER — PREDNISONE 10 MG PO TABS
ORAL_TABLET | ORAL | 0 refills | Status: DC
  Filled 2023-05-11: qty 30, 12d supply, fill #0

## 2023-05-11 MED ORDER — PANTOPRAZOLE SODIUM 40 MG PO TBEC
40.0000 mg | DELAYED_RELEASE_TABLET | Freq: Every day | ORAL | 0 refills | Status: DC
  Filled 2023-05-11: qty 30, 30d supply, fill #0

## 2023-05-11 MED ORDER — ACETAMINOPHEN 325 MG PO TABS: 650.0000 mg | ORAL_TABLET | Freq: Four times a day (QID) | ORAL | Status: DC | PRN

## 2023-05-11 MED ORDER — GUAIFENESIN ER 600 MG PO TB12
600.0000 mg | ORAL_TABLET | Freq: Two times a day (BID) | ORAL | 0 refills | Status: DC
  Filled 2023-05-11: qty 14, 7d supply, fill #0

## 2023-05-11 NOTE — Plan of Care (Signed)

## 2023-05-11 NOTE — Discharge Summary (Signed)
Physician Discharge Summary  Mary Chase ZOX:[id number] DOB: [birth year] DOA: 05/03/2023  PCP: Joycelyn Rua, MD  Admit date: 05/03/2023 Discharge date: 05/11/2023  Admitted From: (Home) Disposition:  (Home )  Recommendations for Outpatient Follow-up:  Follow up with PCP in 1-2 weeks Please obtain BMP/CBC in one week Was encouraged to keep using incentive spirometry and flutter valve.  Home Health: (YES)   Diet recommendation: Heart Healthy   Brief/Interim Summary:   88 y.o. female with medical history significant of asthma, COPD, GERD, hypertension, sickle cell trait presenting with of shortness of breath, present at rest worse with exertion, no orthopnea, no fever chills or sick contacts.  She has history of asthma and 1-2 2 times a year it acts up quite bad according to the patient.  She was brought from home by EMS upon arrival to the ED she was found to be severely hypoxic and placed on BiPAP.  She was admitted for acute hypoxic respiratory failure, asthma exacerbation caused by parainfluenza virus infection.   Acute hypoxic respiratory failure due to severe asthma exacerbation caused by parainfluenza viral infection.  -PCCM input greatly appreciated , as patient required BiPAP initially, but this has improved, she still required some oxygen initially, but she has totally weaned off oxygen at time of discharge . BiPAP requirement for few days, continue with oxygen for now, wean as tolerated. -Continue with IV Solu-Medrol, had to increase over the weekend due to significant wheezing and asthma flareup, but this appears to be improving currently, continue with IV Solu-Medrol low-dose today, and transition to p.o. prednisone in a.m. -continue with DuoNebs, Dulera, Singulair and Claritin.   Essential hypertension.  -lisinopril on hold given her cough and wheezing, started on losartan, continue with 10 mg of amlodipine as well. .   GERD.  Placed on PPI.   History of sickle cell  trait.  Monitor.   Hyperglycemia due to steroids -on insulin sliding scale  Discharge Diagnoses:  Principal Problem:   Acute respiratory failure with hypoxia (HCC) Active Problems:   Asthma, chronic, unspecified asthma severity, with acute exacerbation   HTN (hypertension)   COPD exacerbation (HCC)    Discharge Instructions  Discharge Instructions     Diet - low sodium heart healthy   Complete by: As directed    Discharge instructions   Complete by: As directed    Follow with Primary MD Joycelyn Rua, MD in 7 days   Get CBC, CMP, 2 view Chest X ray checked  by Primary MD next visit.    Activity: As tolerated with Full fall precautions use walker/cane & assistance as needed   Disposition Home    Diet: Heart Healthy    On your next visit with your primary care physician please Get Medicines reviewed and adjusted.   Please request your Prim.MD to go over all Hospital Tests and Procedure/Radiological results at the follow up, please get all Hospital records sent to your Prim MD by signing hospital release before you go home.   If you experience worsening of your admission symptoms, develop shortness of breath, life threatening emergency, suicidal or homicidal thoughts you must seek medical attention immediately by calling 911 or calling your MD immediately  if symptoms less severe.  You Must read complete instructions/literature along with all the possible adverse reactions/side effects for all the Medicines you take and that have been prescribed to you. Take any new Medicines after you have completely understood and accpet all the possible adverse reactions/side effects.   Do not  drive, operating heavy machinery, perform activities at heights, swimming or participation in water activities or provide baby sitting services if your were admitted for syncope or siezures until you have seen by Primary MD or a Neurologist and advised to do so again.  Do not drive when taking  Pain medications.    Do not take more than prescribed Pain, Sleep and Anxiety Medications  Special Instructions: If you have smoked or chewed Tobacco  in the last 2 yrs please stop smoking, stop any regular Alcohol  and or any Recreational drug use.  Wear Seat belts while driving.   Please note  You were cared for by a hospitalist during your hospital stay. If you have any questions about your discharge medications or the care you received while you were in the hospital after you are discharged, you can call the unit and asked to speak with the hospitalist on call if the hospitalist that took care of you is not available. Once you are discharged, your primary care physician will handle any further medical issues. Please note that NO REFILLS for any discharge medications will be authorized once you are discharged, as it is imperative that you return to your primary care physician (or establish a relationship with a primary care physician if you do not have one) for your aftercare needs so that they can reassess your need for medications and monitor your lab values.   Increase activity slowly   Complete by: As directed       Allergies as of 05/11/2023       Reactions   Penicillins Shortness Of Breath, Rash   Keflex [cephalexin] Itching   Sulfa Antibiotics Itching, Other (See Comments)   Swelling in mouth   Other Itching   Some beans         Medication List     STOP taking these medications    lisinopril 40 MG tablet Commonly known as: ZESTRIL       TAKE these medications    acetaminophen 325 MG tablet Commonly known as: TYLENOL Take 2 tablets (650 mg total) by mouth every 6 (six) hours as needed for mild pain (or Fever >/= 101). What changed:  medication strength how much to take when to take this reasons to take this   albuterol 108 (90 Base) MCG/ACT inhaler Commonly known as: VENTOLIN HFA INHALE 2 PUFFS INTO THE LUNGS EVERY 6 HOURS AS NEEDED FOR WHEEZE    alendronate 70 MG tablet Commonly known as: FOSAMAX Take 70 mg by mouth once a week.   amLODipine 10 MG tablet Commonly known as: NORVASC Take 10 mg by mouth daily.   aspirin EC 81 MG tablet Take 1 tablet (81 mg total) by mouth 2 (two) times daily.   fluticasone 50 MCG/ACT nasal spray Commonly known as: FLONASE PLACE 1 SPRAY INTO BOTH NOSTRILS DAILY AS NEEDED FOR ALLERGIES OR RHINITIS.   fluticasone-salmeterol 250-50 MCG/ACT Aepb Commonly known as: Wixela Inhub Inhale 1 puff into the lungs in the morning and at bedtime.   losartan 50 MG tablet Commonly known as: COZAAR Take 1 tablet (50 mg total) by mouth daily.   montelukast 10 MG tablet Commonly known as: SINGULAIR Take 1 tablet (10 mg total) by mouth daily.   Mucus Relief 600 MG 12 hr tablet Generic drug: guaiFENesin Take 1 tablet (600 mg total) by mouth 2 (two) times daily.   pantoprazole 40 MG tablet Commonly known as: PROTONIX Take 1 tablet (40 mg total) by mouth daily.  predniSONE 10 MG tablet Commonly known as: DELTASONE Take 4 tablets (40 mg total) by mouth daily for 3 days, then 3 tabs (30 mg) daily for 3 days, then 2 tabs (20 mg) daily for 3 days, then 1 tablet (10 mg) daily for 3 days, then stop.   Vitamin B-12 5000 MCG Tbdp Take 5,000 mcg by mouth daily.   Vitamin D3 25 MCG (1000 UT) Caps Take 1,000 Units by mouth daily in the afternoon.        Follow-up Information     [facility].   Specialty: Home Health Services Why: For home health services. They will start services 5/20 Contact information: [address] [place] [address] [phone]                Allergies  Allergen Reactions   Penicillins Shortness Of Breath and Rash   Keflex [Cephalexin] Itching   Sulfa Antibiotics Itching and Other (See Comments)    Swelling in mouth   Other Itching    Some beans     Consultations: PCCM   Procedures/Studies: DG Chest Port 1 View  Result  Date: 05/04/2023 CLINICAL DATA:  88 year old female with shortness of breath. EXAM: PORTABLE CHEST 1 VIEW COMPARISON:  Portable chest 05/03/2023 and earlier. FINDINGS: Portable AP upright view at 0645 hours. Stable lung volumes and mediastinal contours, within normal limits. Stable ventilation and Allowing for portable technique the lungs are clear. No pneumothorax or convincing pleural effusion. Chronic severe bilateral glenohumeral degeneration. Negative visible bowel gas. IMPRESSION: No acute cardiopulmonary abnormality. Electronically Signed   By: Odessa Fleming M.D.   On: 05/04/2023 07:11   DG Chest Portable 1 View  Result Date: 05/03/2023 CLINICAL DATA:  Shortness of breath EXAM: PORTABLE CHEST 1 VIEW COMPARISON:  08/28/2022 FINDINGS: No focal consolidation. No pleural effusion or pneumothorax. Heart and mediastinal contours are unremarkable. No acute osseous abnormality. Severe osteoarthritis of bilateral glenohumeral joints. IMPRESSION: 1. No acute cardiopulmonary disease. Electronically Signed   By: Elige Ko M.D.   On: 05/03/2023 13:05      Subjective:  No significant events today, reports she is feeling much better, no dyspnea, reports minimal cough Discharge Exam: Vitals:   05/11/23 0804 05/11/23 1145  BP: (!) 154/86   Pulse:  86  Resp:    Temp: 99.2 F (37.3 C) 98.6 F (37 C)  SpO2: 97% 98%   Vitals:   05/11/23 0400 05/11/23 0600 05/11/23 0804 05/11/23 1145  BP: (!) 151/74 (!) 154/80 (!) 154/86   Pulse: 99 (!) 104  86  Resp: 14 20    Temp: 98.4 F (36.9 C) 98.5 F (36.9 C) 99.2 F (37.3 C) 98.6 F (37 C)  TempSrc: Oral Oral Oral Oral  SpO2: 94% 96% 97% 98%  Weight:      Height:        General: Pt is alert, awake, not in acute distress Cardiovascular: RRR, S1/S2 +, no rubs, no gallops Respiratory: Good air entry bilaterally with minimal end expiratory wheeze Abdominal: Soft, NT, ND, bowel sounds + Extremities: no edema, no cyanosis    The results of significant  diagnostics from this hospitalization (including imaging, microbiology, ancillary and laboratory) are listed below for reference.     Microbiology: Recent Results (from the past 240 hour(s))  Respiratory (~20 pathogens) panel by PCR     Status: Abnormal   Collection Time: 05/03/23 10:06 PM   Specimen: Nasopharyngeal Swab; Respiratory  Result Value Ref Range Status  Adenovirus NOT DETECTED NOT DETECTED Final   Coronavirus 229E NOT DETECTED NOT DETECTED Final    Comment: (NOTE) The Coronavirus on the Respiratory Panel, DOES NOT test for the novel  Coronavirus (2019 nCoV)    Coronavirus HKU1 NOT DETECTED NOT DETECTED Final   Coronavirus NL63 NOT DETECTED NOT DETECTED Final   Coronavirus OC43 NOT DETECTED NOT DETECTED Final   Metapneumovirus NOT DETECTED NOT DETECTED Final   Rhinovirus / Enterovirus NOT DETECTED NOT DETECTED Final   Influenza A NOT DETECTED NOT DETECTED Final   Influenza B NOT DETECTED NOT DETECTED Final   Parainfluenza Virus 1 NOT DETECTED NOT DETECTED Final   Parainfluenza Virus 2 NOT DETECTED NOT DETECTED Final   Parainfluenza Virus 3 DETECTED (A) NOT DETECTED Final   Parainfluenza Virus 4 NOT DETECTED NOT DETECTED Final   Respiratory Syncytial Virus NOT DETECTED NOT DETECTED Final   Bordetella pertussis NOT DETECTED NOT DETECTED Final   Bordetella Parapertussis NOT DETECTED NOT DETECTED Final   Chlamydophila pneumoniae NOT DETECTED NOT DETECTED Final   Mycoplasma pneumoniae NOT DETECTED NOT DETECTED Final    Comment: Performed at [facility], [address] [place], [place] [address]  Expectorated Sputum Assessment w Gram Stain, Rflx to Resp Cult     Status: None   Collection Time: 05/04/23 12:09 PM   Specimen: Expectorated Sputum  Result Value Ref Range Status   Specimen Description EXPECTORATED SPUTUM  Final   Special Requests NONE  Final   Sputum evaluation   Final    THIS SPECIMEN IS ACCEPTABLE FOR SPUTUM CULTURE Performed at [facility]  [facility], [address], [place], [place] [address]    Report Status 05/04/2023 FINAL  Final  Culture, Respiratory w Gram Stain     Status: None   Collection Time: 05/04/23 12:09 PM  Result Value Ref Range Status   Specimen Description EXPECTORATED SPUTUM  Final   Special Requests NONE Reflexed from [id number]  Final   Gram Stain   Final    ABUNDANT WBC PRESENT, PREDOMINANTLY PMN ABUNDANT GRAM POSITIVE COCCI IN PAIRS IN CHAINS    Culture   Final    ABUNDANT Normal respiratory flora-no Staph aureus or Pseudomonas seen Performed at [facility], [address] [place], [place] [address]    Report Status 05/08/2023 FINAL  Final     Labs: BNP (last 3 results) Recent Labs    05/06/23 0411 05/07/23 0631 05/08/23 0346  BNP 38.3 36.3 37.8   Basic Metabolic Panel: Recent Labs  Lab 05/05/23 0746 05/06/23 0411 05/07/23 0631 05/08/23 0346 05/09/23 0339 05/10/23 0623  NA 135 135 136 135 134* 135  K 4.4 4.2 4.4 4.5 4.4 4.6  CL 101 103 103 101 100 98  CO2 25 24 25 25 23 28   GLUCOSE 150* 184* 168* 268* 250* 246*  BUN 21 22 17 16 19 22   CREATININE 1.15* 1.04* 0.98 1.05* 1.10* 1.10*  CALCIUM 9.1 8.8* 9.0 9.1 8.9 9.0  MG 2.2 2.1 1.9 1.8  --  1.7  PHOS  --   --   --   --   --  2.4*   Liver Function Tests: No results for input(s): "AST", "ALT", "ALKPHOS", "BILITOT", "PROT", "ALBUMIN" in the last 168 hours. No results for input(s): "LIPASE", "AMYLASE" in the last 168 hours. No results for input(s): "AMMONIA" in the last 168 hours. CBC: Recent Labs  Lab 05/06/23 0411 05/07/23 0631 05/08/23 0346 05/09/23 0339 05/10/23 0623  WBC 14.3* 13.0* 14.2* 16.6*  20.1*  NEUTROABS 12.4* 10.9* 12.0* 13.7* 16.6*  HGB 11.3* 11.1* 12.1 12.3 12.4  HCT 33.7* 32.9* 35.9* 35.9* 36.6  MCV 93.9 90.6 89.5 89.8 92.2  PLT 205 224 257 256 281   Cardiac Enzymes: No results for input(s): "CKTOTAL", "CKMB", "CKMBINDEX", "TROPONINI" in the last 168 hours. BNP: Invalid input(s): "POCBNP" CBG: Recent Labs   Lab 05/10/23 1229 05/10/23 1629 05/10/23 1951 05/11/23 0802 05/11/23 1142  GLUCAP 178* 193* 343* 182* 137*   D-Dimer No results for input(s): "DDIMER" in the last 72 hours. Hgb A1c Recent Labs    05/09/23 0336  HGBA1C 5.7*   Lipid Profile No results for input(s): "CHOL", "HDL", "LDLCALC", "TRIG", "CHOLHDL", "LDLDIRECT" in the last 72 hours. Thyroid function studies No results for input(s): "TSH", "T4TOTAL", "T3FREE", "THYROIDAB" in the last 72 hours.  Invalid input(s): "FREET3" Anemia work up No results for input(s): "VITAMINB12", "FOLATE", "FERRITIN", "TIBC", "IRON", "RETICCTPCT" in the last 72 hours. Urinalysis    Component Value Date/Time   COLORURINE STRAW (A) 09/10/2020 1426   APPEARANCEUR CLEAR 09/10/2020 1426   LABSPEC 1.005 09/10/2020 1426   PHURINE 8.0 09/10/2020 1426   GLUCOSEU NEGATIVE 09/10/2020 1426   HGBUR NEGATIVE 09/10/2020 1426   BILIRUBINUR NEGATIVE 09/10/2020 1426   KETONESUR NEGATIVE 09/10/2020 1426   PROTEINUR 30 (A) 09/10/2020 1426   NITRITE NEGATIVE 09/10/2020 1426   LEUKOCYTESUR NEGATIVE 09/10/2020 1426   Sepsis Labs Recent Labs  Lab 05/07/23 0631 05/08/23 0346 05/09/23 0339 05/10/23 0623  WBC 13.0* 14.2* 16.6* 20.1*   Microbiology Recent Results (from the past 240 hour(s))  Respiratory (~20 pathogens) panel by PCR     Status: Abnormal   Collection Time: 05/03/23 10:06 PM   Specimen: Nasopharyngeal Swab; Respiratory  Result Value Ref Range Status   Adenovirus NOT DETECTED NOT DETECTED Final   Coronavirus 229E NOT DETECTED NOT DETECTED Final    Comment: (NOTE) The Coronavirus on the Respiratory Panel, DOES NOT test for the novel  Coronavirus (2019 nCoV)    Coronavirus HKU1 NOT DETECTED NOT DETECTED Final   Coronavirus NL63 NOT DETECTED NOT DETECTED Final   Coronavirus OC43 NOT DETECTED NOT DETECTED Final   Metapneumovirus NOT DETECTED NOT DETECTED Final   Rhinovirus / Enterovirus NOT DETECTED NOT DETECTED Final   Influenza A  NOT DETECTED NOT DETECTED Final   Influenza B NOT DETECTED NOT DETECTED Final   Parainfluenza Virus 1 NOT DETECTED NOT DETECTED Final   Parainfluenza Virus 2 NOT DETECTED NOT DETECTED Final   Parainfluenza Virus 3 DETECTED (A) NOT DETECTED Final   Parainfluenza Virus 4 NOT DETECTED NOT DETECTED Final   Respiratory Syncytial Virus NOT DETECTED NOT DETECTED Final   Bordetella pertussis NOT DETECTED NOT DETECTED Final   Bordetella Parapertussis NOT DETECTED NOT DETECTED Final   Chlamydophila pneumoniae NOT DETECTED NOT DETECTED Final   Mycoplasma pneumoniae NOT DETECTED NOT DETECTED Final    Comment: Performed at [facility], [address] [place], [place] [address]  Expectorated Sputum Assessment w Gram Stain, Rflx to Resp Cult     Status: None   Collection Time: 05/04/23 12:09 PM   Specimen: Expectorated Sputum  Result Value Ref Range Status   Specimen Description EXPECTORATED SPUTUM  Final   Special Requests NONE  Final   Sputum evaluation   Final    THIS SPECIMEN IS ACCEPTABLE FOR SPUTUM CULTURE Performed at [facility], [address], [place], [place] [address]    Report Status 05/04/2023 FINAL  Final  Culture, Respiratory w  Gram Stain     Status: None   Collection Time: 05/04/23 12:09 PM  Result Value Ref Range Status   Specimen Description EXPECTORATED SPUTUM  Final   Special Requests NONE Reflexed from [id number]  Final   Gram Stain   Final    ABUNDANT WBC PRESENT, PREDOMINANTLY PMN ABUNDANT GRAM POSITIVE COCCI IN PAIRS IN CHAINS    Culture   Final    ABUNDANT Normal respiratory flora-no Staph aureus or Pseudomonas seen Performed at [facility], [address], [place], [place] [address]    Report Status 05/08/2023 FINAL  Final     Time coordinating discharge: Over 30 minutes  SIGNED:   Huey Bienenstock, MD  [facility] 05/11/2023, 3:18 PM Pager   If 7PM-7AM, please contact night-coverage [email or web address] Password [email or web address]

## 2023-05-11 NOTE — TOC Transition Note (Signed)
Transition of Care [facility]) - CM/SW Discharge Note   Patient Details  Name: Mary Chase MRN: [id number] Date of Birth: 1935/05/02  [facility] CM/SW Contact:  Gordy Clement, RN Phone Number: 05/11/2023, 9:01 AM   Clinical Narrative:    Patient will dc to home today.  [facility] will provide PT services. AVS updated  Family to transport.  Patient will NOT require O2 at DC.   No additional TOC needs      Barriers to Discharge: Continued Medical Work up   Patient Goals and CMS Choice CMS Medicare.gov Compare Post Acute Care list provided to:: Patient Choice offered to / list presented to : Patient  Discharge Placement                         Discharge Plan and Services Additional resources added to the After Visit Summary for     Discharge Planning Services: CM Consult Post Acute Care Choice: Home Health          DME Arranged: N/A         HH Arranged: PT, OT          Social Determinants of Health (SDOH) Interventions SDOH Screenings   Food Insecurity: No Food Insecurity (05/03/2023)  Housing: Low Risk  (05/03/2023)  Transportation Needs: No Transportation Needs (05/03/2023)  Utilities: Not At Risk (05/03/2023)  Tobacco Use: Low Risk  (05/03/2023)     Readmission Risk Interventions     No data to display

## 2023-05-11 NOTE — Discharge Instructions (Signed)
Follow with Primary MD Joycelyn Rua, MD in 7 days   Get CBC, CMP, 2 view Chest X ray checked  by Primary MD next visit.    Activity: As tolerated with Full fall precautions use walker/cane & assistance as needed   Disposition Home    Diet: Heart Healthy  , with feeding assistance and aspiration precautions.  On your next visit with your primary care physician please Get Medicines reviewed and adjusted.   Please request your Prim.MD to go over all Hospital Tests and Procedure/Radiological results at the follow up, please get all Hospital records sent to your Prim MD by signing hospital release before you go home.   If you experience worsening of your admission symptoms, develop shortness of breath, life threatening emergency, suicidal or homicidal thoughts you must seek medical attention immediately by calling 911 or calling your MD immediately  if symptoms less severe.  You Must read complete instructions/literature along with all the possible adverse reactions/side effects for all the Medicines you take and that have been prescribed to you. Take any new Medicines after you have completely understood and accpet all the possible adverse reactions/side effects.   Do not drive, operating heavy machinery, perform activities at heights, swimming or participation in water activities or provide baby sitting services if your were admitted for syncope or siezures until you have seen by Primary MD or a Neurologist and advised to do so again.  Do not drive when taking Pain medications.    Do not take more than prescribed Pain, Sleep and Anxiety Medications  Special Instructions: If you have smoked or chewed Tobacco  in the last 2 yrs please stop smoking, stop any regular Alcohol  and or any Recreational drug use.  Wear Seat belts while driving.   Please note  You were cared for by a hospitalist during your hospital stay. If you have any questions about your discharge medications or the  care you received while you were in the hospital after you are discharged, you can call the unit and asked to speak with the hospitalist on call if the hospitalist that took care of you is not available. Once you are discharged, your primary care physician will handle any further medical issues. Please note that NO REFILLS for any discharge medications will be authorized once you are discharged, as it is imperative that you return to your primary care physician (or establish a relationship with a primary care physician if you do not have one) for your aftercare needs so that they can reassess your need for medications and monitor your lab values.

## 2023-05-12 DIAGNOSIS — J45901 Unspecified asthma with (acute) exacerbation: Secondary | ICD-10-CM | POA: Diagnosis not present

## 2023-05-12 DIAGNOSIS — H269 Unspecified cataract: Secondary | ICD-10-CM | POA: Diagnosis not present

## 2023-05-12 DIAGNOSIS — Z7952 Long term (current) use of systemic steroids: Secondary | ICD-10-CM | POA: Diagnosis not present

## 2023-05-12 DIAGNOSIS — M1611 Unilateral primary osteoarthritis, right hip: Secondary | ICD-10-CM | POA: Diagnosis not present

## 2023-05-12 DIAGNOSIS — I10 Essential (primary) hypertension: Secondary | ICD-10-CM | POA: Diagnosis not present

## 2023-05-12 DIAGNOSIS — J441 Chronic obstructive pulmonary disease with (acute) exacerbation: Secondary | ICD-10-CM | POA: Diagnosis not present

## 2023-05-12 DIAGNOSIS — K219 Gastro-esophageal reflux disease without esophagitis: Secondary | ICD-10-CM | POA: Diagnosis not present

## 2023-05-12 DIAGNOSIS — Z9181 History of falling: Secondary | ICD-10-CM | POA: Diagnosis not present

## 2023-05-12 DIAGNOSIS — Z7982 Long term (current) use of aspirin: Secondary | ICD-10-CM | POA: Diagnosis not present

## 2023-05-12 DIAGNOSIS — J9601 Acute respiratory failure with hypoxia: Secondary | ICD-10-CM | POA: Diagnosis not present

## 2023-05-12 DIAGNOSIS — Z7951 Long term (current) use of inhaled steroids: Secondary | ICD-10-CM | POA: Diagnosis not present

## 2023-05-13 DIAGNOSIS — K219 Gastro-esophageal reflux disease without esophagitis: Secondary | ICD-10-CM | POA: Diagnosis not present

## 2023-05-13 DIAGNOSIS — J9601 Acute respiratory failure with hypoxia: Secondary | ICD-10-CM | POA: Diagnosis not present

## 2023-05-13 DIAGNOSIS — Z7951 Long term (current) use of inhaled steroids: Secondary | ICD-10-CM | POA: Diagnosis not present

## 2023-05-13 DIAGNOSIS — M1611 Unilateral primary osteoarthritis, right hip: Secondary | ICD-10-CM | POA: Diagnosis not present

## 2023-05-13 DIAGNOSIS — Z9181 History of falling: Secondary | ICD-10-CM | POA: Diagnosis not present

## 2023-05-13 DIAGNOSIS — Z7952 Long term (current) use of systemic steroids: Secondary | ICD-10-CM | POA: Diagnosis not present

## 2023-05-13 DIAGNOSIS — H269 Unspecified cataract: Secondary | ICD-10-CM | POA: Diagnosis not present

## 2023-05-13 DIAGNOSIS — J45901 Unspecified asthma with (acute) exacerbation: Secondary | ICD-10-CM | POA: Diagnosis not present

## 2023-05-13 DIAGNOSIS — Z7982 Long term (current) use of aspirin: Secondary | ICD-10-CM | POA: Diagnosis not present

## 2023-05-13 DIAGNOSIS — I10 Essential (primary) hypertension: Secondary | ICD-10-CM | POA: Diagnosis not present

## 2023-05-13 DIAGNOSIS — J441 Chronic obstructive pulmonary disease with (acute) exacerbation: Secondary | ICD-10-CM | POA: Diagnosis not present

## 2023-05-21 DIAGNOSIS — R54 Age-related physical debility: Secondary | ICD-10-CM | POA: Diagnosis not present

## 2023-05-21 DIAGNOSIS — R0609 Other forms of dyspnea: Secondary | ICD-10-CM | POA: Diagnosis not present

## 2023-05-21 DIAGNOSIS — J309 Allergic rhinitis, unspecified: Secondary | ICD-10-CM | POA: Diagnosis not present

## 2023-05-21 DIAGNOSIS — E1122 Type 2 diabetes mellitus with diabetic chronic kidney disease: Secondary | ICD-10-CM | POA: Diagnosis not present

## 2023-05-21 DIAGNOSIS — I70213 Atherosclerosis of native arteries of extremities with intermittent claudication, bilateral legs: Secondary | ICD-10-CM | POA: Diagnosis not present

## 2023-05-21 DIAGNOSIS — N1831 Chronic kidney disease, stage 3a: Secondary | ICD-10-CM | POA: Diagnosis not present

## 2023-05-21 DIAGNOSIS — E1151 Type 2 diabetes mellitus with diabetic peripheral angiopathy without gangrene: Secondary | ICD-10-CM | POA: Diagnosis not present

## 2023-05-21 DIAGNOSIS — R0902 Hypoxemia: Secondary | ICD-10-CM | POA: Diagnosis not present

## 2023-05-21 DIAGNOSIS — I10 Essential (primary) hypertension: Secondary | ICD-10-CM | POA: Diagnosis not present

## 2023-05-27 ENCOUNTER — Other Ambulatory Visit: Payer: Self-pay

## 2023-05-27 ENCOUNTER — Encounter: Payer: Self-pay | Admitting: Allergy & Immunology

## 2023-05-27 ENCOUNTER — Ambulatory Visit: Payer: Medicare Other | Admitting: Allergy & Immunology

## 2023-05-27 VITALS — BP 134/78 | HR 98 | Temp 98.8°F | Resp 20 | Ht 59.5 in | Wt 177.2 lb

## 2023-05-27 DIAGNOSIS — J45901 Unspecified asthma with (acute) exacerbation: Secondary | ICD-10-CM | POA: Diagnosis not present

## 2023-05-27 DIAGNOSIS — Z7982 Long term (current) use of aspirin: Secondary | ICD-10-CM | POA: Diagnosis not present

## 2023-05-27 DIAGNOSIS — I10 Essential (primary) hypertension: Secondary | ICD-10-CM | POA: Diagnosis not present

## 2023-05-27 DIAGNOSIS — J4540 Moderate persistent asthma, uncomplicated: Secondary | ICD-10-CM | POA: Diagnosis not present

## 2023-05-27 DIAGNOSIS — J310 Chronic rhinitis: Secondary | ICD-10-CM | POA: Diagnosis not present

## 2023-05-27 DIAGNOSIS — R768 Other specified abnormal immunological findings in serum: Secondary | ICD-10-CM | POA: Diagnosis not present

## 2023-05-27 DIAGNOSIS — M1611 Unilateral primary osteoarthritis, right hip: Secondary | ICD-10-CM | POA: Diagnosis not present

## 2023-05-27 DIAGNOSIS — Z7952 Long term (current) use of systemic steroids: Secondary | ICD-10-CM | POA: Diagnosis not present

## 2023-05-27 DIAGNOSIS — J441 Chronic obstructive pulmonary disease with (acute) exacerbation: Secondary | ICD-10-CM | POA: Diagnosis not present

## 2023-05-27 DIAGNOSIS — Z9181 History of falling: Secondary | ICD-10-CM | POA: Diagnosis not present

## 2023-05-27 DIAGNOSIS — J9601 Acute respiratory failure with hypoxia: Secondary | ICD-10-CM | POA: Diagnosis not present

## 2023-05-27 DIAGNOSIS — J330 Polyp of nasal cavity: Secondary | ICD-10-CM | POA: Diagnosis not present

## 2023-05-27 DIAGNOSIS — Z7951 Long term (current) use of inhaled steroids: Secondary | ICD-10-CM | POA: Diagnosis not present

## 2023-05-27 DIAGNOSIS — H269 Unspecified cataract: Secondary | ICD-10-CM | POA: Diagnosis not present

## 2023-05-27 DIAGNOSIS — K219 Gastro-esophageal reflux disease without esophagitis: Secondary | ICD-10-CM | POA: Diagnosis not present

## 2023-05-27 MED ORDER — FLUTICASONE-SALMETEROL 250-50 MCG/ACT IN AEPB: 1.0000 | INHALATION_SPRAY | Freq: Two times a day (BID) | RESPIRATORY_TRACT | 5 refills | Status: DC

## 2023-05-27 MED ORDER — FLUTICASONE PROPIONATE 50 MCG/ACT NA SUSP: 1.0000 | Freq: Every day | NASAL | 1 refills | Status: DC | PRN

## 2023-05-27 NOTE — Patient Instructions (Addendum)
1. Moderate persistent asthma, uncomplicated - Lung testing looked better today.  - Let's just keep the Singulair on board. - We can consider doing the Dupixent in the future.  - Daily controller medication(s): Wixela 250/50 mcg one puff TWICE DAILY + Singulair (montelukast) 10mg  daily - Prior to physical activity: albuterol 2 puffs 10-15 minutes before physical activity. - Rescue medications: albuterol 4 puffs every 4-6 hours as needed - Asthma control goals:  * Full participation in all desired activities (may need albuterol before activity) * Albuterol use two time or less a week on average (not counting use with activity) * Cough interfering with sleep two time or less a month * Oral steroids no more than once a year * No hospitalizations  2. 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 - Continue with Mucinex as needed.   3. Return in about 4 months (around 09/26/2023).    Please inform us of any Emergency Department visits, hospitalizations, or changes in symptoms. Call us before going to the ED for breathing or allergy symptoms since we might be able to fit you in for a sick visit. Feel free to contact us anytime with any questions, problems, or concerns.  It was a pleasure to see you again today!  Websites that have reliable patient information: 1. American Academy of Asthma, Allergy, and Immunology: www.aaaai.org 2. Food Allergy Research and Education (FARE): foodallergy.org 3. Mothers of Asthmatics: http://www.asthmacommunitynetwork.org 4. American College of Allergy, Asthma, and Immunology: www.acaai.org   COVID-19 Vaccine Information can be found at: PodExchange.[email or web address] For questions related to vaccine distribution or appointments, please email [email or web address] or call [phone].   We realize that you might be concerned about having an allergic reaction to  the COVID19 vaccines. To help with that concern, WE ARE OFFERING THE COVID19 VACCINES IN OUR OFFICE! Ask the front desk for dates!     "Like" [place] on Facebook and Instagram for our latest updates!      A healthy democracy works best when [employer] participate! Make sure you are registered to vote! If you have moved or changed any of your contact information, you will need to get this updated before voting!  In some cases, you MAY be able to register to vote online: [email or web address]

## 2023-05-27 NOTE — Progress Notes (Signed)
FOLLOW UP  Date of Service/Encounter:  05/27/23   Assessment:   Moderate persistent asthma, uncomplicated - with three rounds of prednisone in less than 6 months   Chronic rhinitis (grasses, weeds, ragweed, trees, molds, dust mites, roach, cat, dog) - s/p years of allergen immunotherapy   Elevated IgE - with normal SPEP and UPEP    Polyp of left nasal cavity - much better with the regular use of fluticasone nasal spray   I think that Ms. Conerly would still benefit from the initiation of Dupixent.  It would help with her nasal polyposis as well.  Unfortunately, she is very hesitant to do this.  She does not think that her 3 rounds of prednisone are that big of a deal.  We did discuss the risk of prednisone including osteoporosis as well as weight gain and hormone imbalance, but this does not seem to sway her.  We gave her some more information on it so that she could make a decision.  She is overall doing well today considering her complicated recent course.  She has been to continue with use of the Acapella device to help clear her pulmonary secretions.  There is no evidence of bronchiectasis on any recent lung imaging, so that is 1 good thing.   Plan/Recommendations:   1. Moderate persistent asthma, uncomplicated - Lung testing looked better today.  - Let's just keep the Singulair on board. - We can consider doing the Dupixent in the future.  - Daily controller medication(s): Wixela 250/50 mcg one puff TWICE DAILY + Singulair (montelukast) 10mg  daily - Prior to physical activity: albuterol 2 puffs 10-15 minutes before physical activity. - Rescue medications: albuterol 4 puffs every 4-6 hours as needed - Asthma control goals:  * Full participation in all desired activities (may need albuterol before activity) * Albuterol use two time or less a week on average (not counting use with activity) * Cough interfering with sleep two time or less a month * Oral steroids no more than once a  year * No hospitalizations  2. 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 - Continue with Mucinex as needed.   3. Return in about 4 months (around 09/26/2023).    Subjective:   Mary Chase is a 88 y.o. 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  Mary Chase is a 88 y.o. female presenting for a follow up visit.  She was last seen in December 2023.  At that time, Dr. Allena Katz treated her for an asthma exacerbation.  She was continued on Wixela 250/50 mcg 1 puff twice daily and albuterol as needed.  For her rhinitis and nasal polyps.  She was continued on Flonase and over-the-counter Mucinex.  Since last visit, she unfortunately has had some problems.   She was admitted to the hospital in  mid May.  She had parainfluenza infection that resulted in asthma exacerbation.  She was on BiPAP during the hospitalization but this was weaned.  She was continued on IV Solu-Medrol and transition to p.o. prednisone.  She was also continued on Dulera 2 puffs twice daily, but is on Wixela. She is now on her second roud nof prendisone from her PCP (first one was from the hospital). She feels that she is finally turing the corner.  She has been home for two weeks. She is not back to normal yet. She has been using her pickle  (Acapella device) to help break stuff up. She thinks that this is helping.   Asthma/Respiratory Symptom History: She remains on the Wixela one puff twice daily. She does need a refill of this. She feels that some of the canisters are empty. She had to send one back once because she was not getting anything out of this.  In any case she does think that the [facility] has been helpful for her. She has not been using her rescue inhaler all that much.   Allergic Rhinitis Symptom History: She remains on the fluticasone 1-2 times daily to help with her nasal polyps. She still cannot taste or smell, despite being on prednisone for a prolonged period of time.   She has a history of very elevated IgE levels, although they are trending down.  They have been as high as 48,710.  We have referred her to hematology/oncology but they declined the referral saying that we had already done a thorough workup.  Otherwise, there have been no changes to her past medical history, surgical history, family history, or social history.    Review of Systems  Constitutional: Negative.  Negative for chills, fever, malaise/fatigue and weight loss.  HENT:  Positive for congestion and sinus pain. Negative for ear discharge and ear pain.   Eyes:  Negative for pain, discharge and redness.  Respiratory:  Negative for cough, sputum production, shortness of breath and wheezing.   Cardiovascular: Negative.  Negative for chest pain and palpitations.  Gastrointestinal:  Negative for abdominal pain, constipation, diarrhea, heartburn, nausea and vomiting.  Skin: Negative.  Negative for itching and rash.  Neurological:  Negative for dizziness and headaches.  Endo/Heme/Allergies:  Negative for environmental allergies. Does not bruise/bleed easily.       Objective:   Blood pressure 134/78, pulse 98, temperature 98.8 F (37.1 C), resp. rate 20, height 4' 11.5" (1.511 m), weight 177 lb 4 oz (80.4 kg), SpO2 97 %. Body mass index is 35.2  kg/m.    Physical Exam Vitals reviewed.  Constitutional:      Appearance: She is well-developed.     Comments: Very pleasant female.  Extremely talkative.  HENT:     Head: Normocephalic and atraumatic.     Right Ear: Tympanic membrane, ear canal and external ear normal. No drainage, swelling or tenderness. Tympanic membrane is not injected, scarred, erythematous, retracted or bulging.     Left Ear: Tympanic membrane, ear canal and external ear normal. No drainage, swelling or tenderness. Tympanic membrane is not injected, scarred, erythematous, retracted or bulging.     Nose: Nasal deformity present. No septal deviation, mucosal edema or rhinorrhea.     Right Turbinates: Enlarged, swollen and pale.     Left Turbinates: Enlarged, swollen and pale.     Right Sinus: No maxillary sinus tenderness or frontal sinus tenderness.     Left Sinus: No maxillary sinus tenderness or frontal sinus tenderness.  Comments: Polyp on the left much smaller, obstructing approximately 25% of the nasal cavity. This has not changed much over the last 6 months since I saw her last time.     Mouth/Throat:     Lips: Pink.     Mouth: Mucous membranes are moist. Mucous membranes are not pale and not dry.     Pharynx: Uvula midline.     Comments: Cobblestoning in the posterior oropharynx.  Eyes:     General:        Right eye: No discharge.        Left eye: No discharge.     Conjunctiva/sclera: Conjunctivae normal.     Right eye: Right conjunctiva is not injected. No chemosis.    Left eye: Left conjunctiva is not injected. No chemosis.    Pupils: Pupils are equal, round, and reactive to light.  Cardiovascular:     Rate and Rhythm: Normal rate and regular rhythm.     Heart sounds: Normal heart sounds.  Pulmonary:     Effort: Pulmonary effort is normal. No tachypnea, accessory muscle usage or respiratory distress.     Breath sounds: Decreased air movement present. No wheezing, rhonchi or rales.     Comments:  Decreased air movement at the bases. No crackles noted.  Chest:     Chest wall: No tenderness.  Musculoskeletal:     Cervical back: No rigidity.  Lymphadenopathy:     Head:     Right side of head: No submandibular, tonsillar or occipital adenopathy.     Left side of head: No submandibular, tonsillar or occipital adenopathy.     Cervical: No cervical adenopathy.  Skin:    Coloration: Skin is not pale.     Findings: No abrasion, erythema, petechiae or rash. Rash is not papular, urticarial or vesicular.  Neurological:     Mental Status: She is alert.  Psychiatric:        Behavior: Behavior is cooperative.      Diagnostic studies:    Spirometry: results abnormal (FEV1: 0.69/53%, FVC: 2.52/147%, FEV1/FVC: 27%).    Spirometry consistent with moderate obstructive disease.   Allergy Studies: none        Malachi Bonds, MD  [facility]

## 2023-05-28 ENCOUNTER — Ambulatory Visit: Payer: Medicare Other | Attending: Cardiology | Admitting: Cardiology

## 2023-05-28 ENCOUNTER — Encounter: Payer: Self-pay | Admitting: Cardiology

## 2023-05-28 VITALS — BP 140/78 | HR 105 | Ht 59.5 in | Wt 174.2 lb

## 2023-05-28 DIAGNOSIS — I10 Essential (primary) hypertension: Secondary | ICD-10-CM | POA: Diagnosis not present

## 2023-05-28 DIAGNOSIS — Z79899 Other long term (current) drug therapy: Secondary | ICD-10-CM | POA: Diagnosis not present

## 2023-05-28 DIAGNOSIS — R0602 Shortness of breath: Secondary | ICD-10-CM

## 2023-05-28 MED ORDER — FUROSEMIDE 20 MG PO TABS: 20.0000 mg | ORAL_TABLET | Freq: Every day | ORAL | 3 refills | Status: DC

## 2023-05-28 MED ORDER — METOPROLOL TARTRATE 100 MG PO TABS
100.0000 mg | ORAL_TABLET | Freq: Once | ORAL | 0 refills | Status: DC
Start: 2023-05-28 — End: 2023-06-17

## 2023-05-28 NOTE — Patient Instructions (Addendum)
Medication Instructions:  Please START taking lasix (furosemide) 20 mg daily.   *If you need a refill on your cardiac medications before your next appointment, please call your pharmacy*   Lab Work: Please schedule to have a TSH and a BMET in our lab in ONE WEEK.  You do not need to be fasting for these labs.  If you have labs (blood work) drawn today and your tests are completely normal, you will receive your results only by: MyChart Message (if you have MyChart) OR A paper copy in the mail If you have any lab test that is abnormal or we need to change your treatment, we will call you to review the results.   Testing/Procedures: Your physician has requested that you have an echocardiogram. Echocardiography is a painless test that uses sound waves to create images of your heart. It provides your doctor with information about the size and shape of your heart and how well your heart's chambers and valves are working. This procedure takes approximately one hour. There are no restrictions for this procedure. Please do NOT wear cologne, perfume, aftershave, or lotions (deodorant is allowed). Please arrive 15 minutes prior to your appointment time.    Your cardiac CT will be scheduled at:   [facility] [address] [place], [place] [address] [phone]  Please arrive at the [facility] and Children's Entrance (Entrance C2) of [facility] 30 minutes prior to test start time. You can use the FREE valet parking offered at entrance C (encouraged to control the heart rate for the test)  Proceed to the [facility] Radiology Department (first floor) to check-in and test prep.  All radiology patients and guests should use entrance C2 at [facility], accessed from [facility], even though the hospital's physical address listed is [address].       Please follow these instructions carefully (unless otherwise directed):  Hold all erectile  dysfunction medications at least 3 days (72 hrs) prior to test. (Ie viagra, cialis, sildenafil, tadalafil, etc) We will administer nitroglycerin during this exam.   On the Night Before the Test: Be sure to Drink plenty of water. Do not consume any caffeinated/decaffeinated beverages or chocolate 12 hours prior to your test. Do not take any antihistamines 12 hours prior to your test.   On the Day of the Test: Drink plenty of water until 1 hour prior to the test. Do not eat any food 1 hour prior to test. You may take your regular medications prior to the test.  Take metoprolol (Lopressor) two hours prior to test. If you take Furosemide/Hydrochlorothiazide/Spironolactone, please HOLD on the morning of the test. FEMALES- please wear underwire-free bra if available, avoid dresses & tight clothing        After the Test: Drink plenty of water. After receiving IV contrast, you may experience a mild flushed feeling. This is normal. On occasion, you may experience a mild rash up to 24 hours after the test. This is not dangerous. If this occurs, you can take Benadryl 25 mg and increase your fluid intake. If you experience trouble breathing, this can be serious. If it is severe call 911 IMMEDIATELY. If it is mild, please call our office. We will call to schedule your test 2-4 weeks out understanding that some insurance companies will need an authorization prior to the service being performed.   For non-scheduling related questions, please contact the cardiac imaging nurse navigator should you have any questions/concerns: Huntley Dec  Earlene Plater, Cardiac Imaging Nurse Navigator Larey Brick, Cardiac Imaging Nurse Navigator  Heart and V[facility] Services Direct Office Dial: [phone]   For scheduling needs, including cancellations and rescheduling, please call [place], [phone].      Follow-Up: At [facility], you and your health needs are our priority.  As part of our  continuing mission to provide you with exceptional heart care, we have created designated Provider Care Teams.  These Care Teams include your primary Cardiologist (physician) and Advanced Practice Providers (APPs -  Physician Assistants and Nurse Practitioners) who all work together to provide you with the care you need, when you need it.  We recommend signing up for the patient portal called "MyChart".  Sign up information is provided on this After Visit Summary.  MyChart is used to connect with patients for Virtual Visits (Telemedicine).  Patients are able to view lab/test results, encounter notes, upcoming appointments, etc.  Non-urgent messages can be sent to your provider as well.   To learn more about what you can do with MyChart, go to [email or web address].    Your next appointment:   4 week(s)  Provider:   Dr. Armanda Magic or APP.

## 2023-05-28 NOTE — Progress Notes (Signed)
Cardiology CONSULT Note    Date:  05/28/2023   ID:  Mary Chase, DOB 08/26/1935, MRN [id number]  PCP:  Joycelyn Rua, MD  Cardiologist:  Armanda Magic, MD   Chief Complaint  Patient presents with   New Patient (Initial Visit)    SOB    Patient Profile: Mary Chase is a 88 y.o. female who is being seen today for the evaluation of shortness of breath at the request of Joycelyn Rua, MD.  History of Present Illness:  Mary Chase is a 88 y.o. female who is being seen today for the evaluation of shortness of breath at the request of Joycelyn Rua, MD.  This is an 88 year old female with a history of PAD, CKD stage III, COPD, diabetes mellitus complicated by renal disease, GERD, hyperlipidemia, hypertension and sickle cell anemia who was referred for evaluation of shortness of breath.    She was recently hospitalized and discharged on 05/11/2023 after admission for acute hypoxic respiratory failure secondary to severe asthma exacerbation caused by Para Influenza viral infection.  She was started on IV steroids and transition to oral steroids. Her troponin was normal.   She is referred now for evaluation of her SOB.  Apparently she was told years ago that she had an enlarged heart but she has not been seen by a Cardiologist and has not had any echo done. She denies any chest pain or pressure.  She has chronic SOB due to COPD and asthma but lately has been occurring on a daily basis and much worse with exertion.  This is new compared to when she had her asthma attacks.  Usually inhalers would help her SOB and they are not helping now. She has had some LE edema at times in her ankles, legs and hands. She has dizziness at times but denies any syncope. She has occasional palpitations that are fairly shortlived. She was told by her PT that her heart rate was high before therapy.   Past Medical History:  Diagnosis Date   Age related osteoporosis    Allergic rhinitis     Arthritis    Asthma    Atherosclerosis of native artery of both lower extremities with intermittent claudication (HCC)    Cataract    Chronic kidney disease, stage 3a (HCC)    COPD (chronic obstructive pulmonary disease) (HCC)    Degenerative joint disease (DJD) of hip    Diabetic renal disease (HCC)    Dyspnea    with exertion   GERD (gastroesophageal reflux disease)    Headache    Hypercholesterolemia    Hypertension    Hypertensive retinopathy    Osteoarthritis of hip    Pneumonia    Pre-diabetes    A1c within normal limits last check   PVD (peripheral vascular disease) (HCC)    Sickle cell anemia (HCC)    trait   Sickle cell trait (HCC)     Past Surgical History:  Procedure Laterality Date   ANKLE SURGERY Right 2002   pin   BREAST BIOPSY Right 11/01/2013   Procedure: RIGHT BREAST MASS EXCISION;  Surgeon: Emelia Loron, MD;  Location: [facility] OR;  Service: General;  Laterality: Right;   CATARACT EXTRACTION     ELBOW SURGERY Right    pin   MULTIPLE TOOTH EXTRACTIONS     TOTAL HIP ARTHROPLASTY Right 09/16/2020   Procedure: RIGHT TOTAL HIP ARTHROPLASTY ANTERIOR APPROACH;  Surgeon: Gean Birchwood, MD;  Location: [facility] ORS;  Service: Orthopedics;  Laterality:  Right;   TUBAL LIGATION      Current Medications: Current Meds  Medication Sig   acetaminophen (TYLENOL) 325 MG tablet Take 2 tablets (650 mg total) by mouth every 6 (six) hours as needed for mild pain (or Fever >/= 101).   albuterol (VENTOLIN HFA) 108 (90 Base) MCG/ACT inhaler INHALE 2 PUFFS INTO THE LUNGS EVERY 6 HOURS AS NEEDED FOR WHEEZE   amLODipine (NORVASC) 10 MG tablet Take 10 mg by mouth daily.   aspirin EC 81 MG tablet Take 1 tablet (81 mg total) by mouth 2 (two) times daily.   Cholecalciferol (VITAMIN D3) 25 MCG (1000 UT) CAPS Take 1,000 Units by mouth daily in the afternoon.   Cyanocobalamin (VITAMIN B-12) 5000 MCG TBDP Take 5,000 mcg by mouth daily.   fluticasone (FLONASE) 50 MCG/ACT nasal spray Place 1  spray into both nostrils daily as needed for allergies or rhinitis.   fluticasone-salmeterol (WIXELA INHUB) 250-50 MCG/ACT AEPB Inhale 1 puff into the lungs in the morning and at bedtime.   losartan (COZAAR) 50 MG tablet Take 1 tablet (50 mg total) by mouth daily.   montelukast (SINGULAIR) 10 MG tablet Take 1 tablet (10 mg total) by mouth daily.   pantoprazole (PROTONIX) 40 MG tablet Take 1 tablet (40 mg total) by mouth daily.   predniSONE (DELTASONE) 10 MG tablet Take 4 tablets (40 mg total) by mouth daily for 3 days, then 3 tabs (30 mg) daily for 3 days, then 2 tabs (20 mg) daily for 3 days, then 1 tablet (10 mg) daily for 3 days, then stop.    Allergies:   Penicillins, Keflex [cephalexin], Sulfa antibiotics, and Other   Social History   Socioeconomic History   Marital status: Widowed    Spouse name: Not on file   Number of children: Not on file   Years of education: Not on file   Highest education level: Not on file  Occupational History   Not on file  Tobacco Use   Smoking status: Never   Smokeless tobacco: Never   Tobacco comments:    occ wine  Vaping Use   Vaping Use: Never used  Substance and Sexual Activity   Alcohol use: Yes    Comment: occ   Drug use: No   Sexual activity: Not on file  Other Topics Concern   Not on file  Social History Narrative   Not on file   Social Determinants of Health   Financial Resource Strain: Not on file  Food Insecurity: No Food Insecurity (05/03/2023)   Hunger Vital Sign    Worried About Running Out of Food in the Last Year: Never true    Ran Out of Food in the Last Year: Never true  Transportation Needs: No Transportation Needs (05/03/2023)   PRAPARE - Administrator, Civil Service (Medical): No    Lack of Transportation (Non-Medical): No  Physical Activity: Not on file  Stress: Not on file  Social Connections: Not on file     Family History:  The patient's family history is not on file.   ROS:   Please see the  history of present illness.    ROS All other systems reviewed and are negative.      No data to display             PHYSICAL EXAM:   VS:  BP (!) 140/78   Pulse (!) 105   Ht 4' 11.5" (1.511 m)   Wt 174 lb 3.2 oz (  79 kg)   SpO2 98%   BMI 34.60 kg/m    GEN: Well nourished, well developed, in no acute distress  HEENT: normal  Neck: no JVD, carotid bruits, or masses Cardiac: RRR; no murmurs, rubs, or gallops.  2+ pitting ankle edema.  Intact distal pulses bilaterally.  Respiratory:  clear to auscultation bilaterally, normal work of breathing GI: soft, nontender, nondistended, + BS MS: no deformity or atrophy  Skin: warm and dry, no rash Neuro:  Alert and Oriented x 3, Strength and sensation are intact Psych: euthymic mood, full affect  Wt Readings from Last 3 Encounters:  05/28/23 174 lb 3.2 oz (79 kg)  05/27/23 177 lb 4 oz (80.4 kg)  05/03/23 158 lb 15.2 oz (72.1 kg)      Studies/Labs Reviewed:   EKG:  EKG is ordered today and showed sinus tachycardia with PVCs      Recent Labs: 05/04/2023: ALT 10 05/08/2023: B Natriuretic Peptide 37.8 05/10/2023: BUN 22; Creatinine, Ser 1.10; Hemoglobin 12.4; Magnesium 1.7; Platelets 281; Potassium 4.6; Sodium 135   Lipid Panel No results found for: "CHOL", "TRIG", "HDL", "CHOLHDL", "VLDL", "LDLCALC", "LDLDIRECT"   ASSESSMENT:    1. SOB (shortness of breath)   2. Primary hypertension      PLAN:  In order of problems listed above:  Dyspnea on exertion -She does have a history of COPD which may be contributing but her SOB usually improved with inhalers but recently her SOB is constant and much worse with ambulation and not improved  with inhalers like it used to be -Hemoglobin was normal at 12.4 on 05/10/2023 -Given her multiple cardiac risk factors including diabetes mellitus, hypertension, hyperlipidemia and PAD I recommended getting a coronary CTA to define coronary anatomy and rule out CAD as an etiology of shortness of  breath (last serum creatinine was 1.1 on 05/10/2023). -Check 2D echo to assess LV function -Check TSH  2.  Hypertension -BP controlled on exam today -Continue prescription drug management with amlodipine 10 mg daily, losartan 50 mg daily with as needed refills  3.  LE edema -likely related to the steroids she has been on for her asthma exacerbation -she does have edema in her ankles -start Lasix 20mg  daily -check BMET in 1 week -check BNP   Followup:  4 week followup  Medication Adjustments/Labs and Tests Ordered: Current medicines are reviewed at length with the patient today.  Concerns regarding medicines are outlined above.  Medication changes, Labs and Tests ordered today are listed in the Patient Instructions below.  There are no Patient Instructions on file for this visit.   Signed, Armanda Magic, MD  05/28/2023 1:39 PM    [facility] [address] [place], [place], [place]  [address] Phone: [phone]; Fax: [phone]

## 2023-06-01 DIAGNOSIS — K219 Gastro-esophageal reflux disease without esophagitis: Secondary | ICD-10-CM | POA: Diagnosis not present

## 2023-06-01 DIAGNOSIS — H269 Unspecified cataract: Secondary | ICD-10-CM | POA: Diagnosis not present

## 2023-06-01 DIAGNOSIS — Z7952 Long term (current) use of systemic steroids: Secondary | ICD-10-CM | POA: Diagnosis not present

## 2023-06-01 DIAGNOSIS — J45901 Unspecified asthma with (acute) exacerbation: Secondary | ICD-10-CM | POA: Diagnosis not present

## 2023-06-01 DIAGNOSIS — Z7951 Long term (current) use of inhaled steroids: Secondary | ICD-10-CM | POA: Diagnosis not present

## 2023-06-01 DIAGNOSIS — M1611 Unilateral primary osteoarthritis, right hip: Secondary | ICD-10-CM | POA: Diagnosis not present

## 2023-06-01 DIAGNOSIS — Z7982 Long term (current) use of aspirin: Secondary | ICD-10-CM | POA: Diagnosis not present

## 2023-06-01 DIAGNOSIS — J9601 Acute respiratory failure with hypoxia: Secondary | ICD-10-CM | POA: Diagnosis not present

## 2023-06-01 DIAGNOSIS — Z9181 History of falling: Secondary | ICD-10-CM | POA: Diagnosis not present

## 2023-06-01 DIAGNOSIS — I10 Essential (primary) hypertension: Secondary | ICD-10-CM | POA: Diagnosis not present

## 2023-06-01 DIAGNOSIS — J441 Chronic obstructive pulmonary disease with (acute) exacerbation: Secondary | ICD-10-CM | POA: Diagnosis not present

## 2023-06-04 ENCOUNTER — Ambulatory Visit (HOSPITAL_COMMUNITY): Payer: Medicare Other | Attending: Cardiology

## 2023-06-04 ENCOUNTER — Encounter: Payer: Self-pay | Admitting: Cardiology

## 2023-06-04 ENCOUNTER — Ambulatory Visit (HOSPITAL_BASED_OUTPATIENT_CLINIC_OR_DEPARTMENT_OTHER): Payer: Medicare Other

## 2023-06-04 DIAGNOSIS — J9601 Acute respiratory failure with hypoxia: Secondary | ICD-10-CM | POA: Diagnosis not present

## 2023-06-04 DIAGNOSIS — I340 Nonrheumatic mitral (valve) insufficiency: Secondary | ICD-10-CM | POA: Insufficient documentation

## 2023-06-04 DIAGNOSIS — R0602 Shortness of breath: Secondary | ICD-10-CM | POA: Insufficient documentation

## 2023-06-04 DIAGNOSIS — I272 Pulmonary hypertension, unspecified: Secondary | ICD-10-CM | POA: Insufficient documentation

## 2023-06-04 DIAGNOSIS — Z79899 Other long term (current) drug therapy: Secondary | ICD-10-CM | POA: Insufficient documentation

## 2023-06-04 DIAGNOSIS — I5033 Acute on chronic diastolic (congestive) heart failure: Secondary | ICD-10-CM | POA: Insufficient documentation

## 2023-06-04 DIAGNOSIS — I350 Nonrheumatic aortic (valve) stenosis: Secondary | ICD-10-CM | POA: Insufficient documentation

## 2023-06-04 DIAGNOSIS — M1611 Unilateral primary osteoarthritis, right hip: Secondary | ICD-10-CM | POA: Diagnosis not present

## 2023-06-04 DIAGNOSIS — I071 Rheumatic tricuspid insufficiency: Secondary | ICD-10-CM | POA: Insufficient documentation

## 2023-06-04 DIAGNOSIS — Z7951 Long term (current) use of inhaled steroids: Secondary | ICD-10-CM | POA: Diagnosis not present

## 2023-06-04 DIAGNOSIS — Z9181 History of falling: Secondary | ICD-10-CM | POA: Diagnosis not present

## 2023-06-04 DIAGNOSIS — Z7982 Long term (current) use of aspirin: Secondary | ICD-10-CM | POA: Diagnosis not present

## 2023-06-04 DIAGNOSIS — H269 Unspecified cataract: Secondary | ICD-10-CM | POA: Diagnosis not present

## 2023-06-04 DIAGNOSIS — J441 Chronic obstructive pulmonary disease with (acute) exacerbation: Secondary | ICD-10-CM | POA: Diagnosis not present

## 2023-06-04 DIAGNOSIS — J45901 Unspecified asthma with (acute) exacerbation: Secondary | ICD-10-CM | POA: Diagnosis not present

## 2023-06-04 DIAGNOSIS — I10 Essential (primary) hypertension: Secondary | ICD-10-CM | POA: Diagnosis not present

## 2023-06-04 DIAGNOSIS — K219 Gastro-esophageal reflux disease without esophagitis: Secondary | ICD-10-CM | POA: Diagnosis not present

## 2023-06-04 DIAGNOSIS — Z7952 Long term (current) use of systemic steroids: Secondary | ICD-10-CM | POA: Diagnosis not present

## 2023-06-04 DIAGNOSIS — I77810 Thoracic aortic ectasia: Secondary | ICD-10-CM | POA: Insufficient documentation

## 2023-06-04 LAB — ECHOCARDIOGRAM COMPLETE
AR max vel: 1.77 cm2
AV Area VTI: 1.86 cm2
AV Area mean vel: 1.77 cm2
AV Mean grad: 9.3 mmHg
AV Peak grad: 17.2 mmHg
Ao pk vel: 2.07 m/s
Area-P 1/2: 3.96 cm2
MV M vel: 5.55 m/s
MV Peak grad: 123.2 mmHg
P 1/2 time: 298 msec
S' Lateral: 2.2 cm

## 2023-06-05 LAB — BASIC METABOLIC PANEL
BUN/Creatinine Ratio: 19 (ref 12–28)
BUN: 19 mg/dL (ref 8–27)
CO2: 27 mmol/L (ref 20–29)
Calcium: 9 mg/dL (ref 8.7–10.3)
Chloride: 102 mmol/L (ref 96–106)
Creatinine, Ser: 1.02 mg/dL — ABNORMAL HIGH (ref 0.57–1.00)
Glucose: 88 mg/dL (ref 70–99)
Potassium: 4.3 mmol/L (ref 3.5–5.2)
Sodium: 143 mmol/L (ref 134–144)
eGFR: 54 mL/min/{1.73_m2} — ABNORMAL LOW (ref >59)

## 2023-06-05 LAB — TSH: TSH: 2.84 u[IU]/mL (ref 0.450–4.500)

## 2023-06-08 DIAGNOSIS — E1151 Type 2 diabetes mellitus with diabetic peripheral angiopathy without gangrene: Secondary | ICD-10-CM | POA: Diagnosis not present

## 2023-06-08 DIAGNOSIS — J449 Chronic obstructive pulmonary disease, unspecified: Secondary | ICD-10-CM | POA: Diagnosis not present

## 2023-06-08 DIAGNOSIS — Z7982 Long term (current) use of aspirin: Secondary | ICD-10-CM | POA: Diagnosis not present

## 2023-06-08 DIAGNOSIS — Z7951 Long term (current) use of inhaled steroids: Secondary | ICD-10-CM | POA: Diagnosis not present

## 2023-06-08 DIAGNOSIS — E1122 Type 2 diabetes mellitus with diabetic chronic kidney disease: Secondary | ICD-10-CM | POA: Diagnosis not present

## 2023-06-08 DIAGNOSIS — N1831 Chronic kidney disease, stage 3a: Secondary | ICD-10-CM | POA: Diagnosis not present

## 2023-06-08 DIAGNOSIS — I70213 Atherosclerosis of native arteries of extremities with intermittent claudication, bilateral legs: Secondary | ICD-10-CM | POA: Diagnosis not present

## 2023-06-08 DIAGNOSIS — M1611 Unilateral primary osteoarthritis, right hip: Secondary | ICD-10-CM | POA: Diagnosis not present

## 2023-06-08 DIAGNOSIS — Z9181 History of falling: Secondary | ICD-10-CM | POA: Diagnosis not present

## 2023-06-08 DIAGNOSIS — J9601 Acute respiratory failure with hypoxia: Secondary | ICD-10-CM | POA: Diagnosis not present

## 2023-06-08 DIAGNOSIS — K219 Gastro-esophageal reflux disease without esophagitis: Secondary | ICD-10-CM | POA: Diagnosis not present

## 2023-06-08 DIAGNOSIS — J441 Chronic obstructive pulmonary disease with (acute) exacerbation: Secondary | ICD-10-CM | POA: Diagnosis not present

## 2023-06-08 DIAGNOSIS — H269 Unspecified cataract: Secondary | ICD-10-CM | POA: Diagnosis not present

## 2023-06-08 DIAGNOSIS — I10 Essential (primary) hypertension: Secondary | ICD-10-CM | POA: Diagnosis not present

## 2023-06-08 DIAGNOSIS — J45901 Unspecified asthma with (acute) exacerbation: Secondary | ICD-10-CM | POA: Diagnosis not present

## 2023-06-08 DIAGNOSIS — Z7952 Long term (current) use of systemic steroids: Secondary | ICD-10-CM | POA: Diagnosis not present

## 2023-06-09 DIAGNOSIS — J45901 Unspecified asthma with (acute) exacerbation: Secondary | ICD-10-CM | POA: Diagnosis not present

## 2023-06-09 DIAGNOSIS — Z7951 Long term (current) use of inhaled steroids: Secondary | ICD-10-CM | POA: Diagnosis not present

## 2023-06-09 DIAGNOSIS — Z7982 Long term (current) use of aspirin: Secondary | ICD-10-CM | POA: Diagnosis not present

## 2023-06-09 DIAGNOSIS — H269 Unspecified cataract: Secondary | ICD-10-CM | POA: Diagnosis not present

## 2023-06-09 DIAGNOSIS — J441 Chronic obstructive pulmonary disease with (acute) exacerbation: Secondary | ICD-10-CM | POA: Diagnosis not present

## 2023-06-09 DIAGNOSIS — J9601 Acute respiratory failure with hypoxia: Secondary | ICD-10-CM | POA: Diagnosis not present

## 2023-06-09 DIAGNOSIS — Z7952 Long term (current) use of systemic steroids: Secondary | ICD-10-CM | POA: Diagnosis not present

## 2023-06-09 DIAGNOSIS — Z9181 History of falling: Secondary | ICD-10-CM | POA: Diagnosis not present

## 2023-06-09 DIAGNOSIS — M1611 Unilateral primary osteoarthritis, right hip: Secondary | ICD-10-CM | POA: Diagnosis not present

## 2023-06-09 DIAGNOSIS — I10 Essential (primary) hypertension: Secondary | ICD-10-CM | POA: Diagnosis not present

## 2023-06-09 DIAGNOSIS — K219 Gastro-esophageal reflux disease without esophagitis: Secondary | ICD-10-CM | POA: Diagnosis not present

## 2023-06-15 ENCOUNTER — Telehealth: Payer: Self-pay | Admitting: Cardiology

## 2023-06-15 DIAGNOSIS — Z7951 Long term (current) use of inhaled steroids: Secondary | ICD-10-CM | POA: Diagnosis not present

## 2023-06-15 DIAGNOSIS — Z7982 Long term (current) use of aspirin: Secondary | ICD-10-CM | POA: Diagnosis not present

## 2023-06-15 DIAGNOSIS — M1611 Unilateral primary osteoarthritis, right hip: Secondary | ICD-10-CM | POA: Diagnosis not present

## 2023-06-15 DIAGNOSIS — J45901 Unspecified asthma with (acute) exacerbation: Secondary | ICD-10-CM | POA: Diagnosis not present

## 2023-06-15 DIAGNOSIS — J441 Chronic obstructive pulmonary disease with (acute) exacerbation: Secondary | ICD-10-CM | POA: Diagnosis not present

## 2023-06-15 DIAGNOSIS — J9601 Acute respiratory failure with hypoxia: Secondary | ICD-10-CM | POA: Diagnosis not present

## 2023-06-15 DIAGNOSIS — K219 Gastro-esophageal reflux disease without esophagitis: Secondary | ICD-10-CM | POA: Diagnosis not present

## 2023-06-15 DIAGNOSIS — Z9181 History of falling: Secondary | ICD-10-CM | POA: Diagnosis not present

## 2023-06-15 DIAGNOSIS — Z7952 Long term (current) use of systemic steroids: Secondary | ICD-10-CM | POA: Diagnosis not present

## 2023-06-15 DIAGNOSIS — H269 Unspecified cataract: Secondary | ICD-10-CM | POA: Diagnosis not present

## 2023-06-15 DIAGNOSIS — I10 Essential (primary) hypertension: Secondary | ICD-10-CM | POA: Diagnosis not present

## 2023-06-15 NOTE — Telephone Encounter (Signed)
Patient stated she stopped lasix on  06/11/23, due to side effects. Patient stated when she started Lasix her swelling did go down in her hands, feet, and knees. Patient stated her swelling continues to go down even after stopping. Patient complained that she had dizziness, mood swings, SOB, feeling nauseated, and headaches while taking the lasix, but all that has resolved. Patient has follow-up with Tereso Newcomer PA next week to discuss TEE and echo results. Will forward to Dr. Mayford Knife for advisement.

## 2023-06-15 NOTE — Telephone Encounter (Signed)
Pt c/o medication issue:  1. Name of Medication: furosemide (LASIX) 20 MG tablet   2. How are you currently taking this medication (dosage and times per day)? Take 1 tablet (20 mg total) by mouth daily.  3. Are you having a reaction (difficulty breathing--STAT)? No   4. What is your medication issue? Medication makes patient dizzy, mood swings, headache, nauseated, SOB

## 2023-06-17 DIAGNOSIS — H269 Unspecified cataract: Secondary | ICD-10-CM | POA: Diagnosis not present

## 2023-06-17 DIAGNOSIS — Z7952 Long term (current) use of systemic steroids: Secondary | ICD-10-CM | POA: Diagnosis not present

## 2023-06-17 DIAGNOSIS — Z7982 Long term (current) use of aspirin: Secondary | ICD-10-CM | POA: Diagnosis not present

## 2023-06-17 DIAGNOSIS — Z9181 History of falling: Secondary | ICD-10-CM | POA: Diagnosis not present

## 2023-06-17 DIAGNOSIS — J9601 Acute respiratory failure with hypoxia: Secondary | ICD-10-CM | POA: Diagnosis not present

## 2023-06-17 DIAGNOSIS — J441 Chronic obstructive pulmonary disease with (acute) exacerbation: Secondary | ICD-10-CM | POA: Diagnosis not present

## 2023-06-17 DIAGNOSIS — Z7951 Long term (current) use of inhaled steroids: Secondary | ICD-10-CM | POA: Diagnosis not present

## 2023-06-17 DIAGNOSIS — J45901 Unspecified asthma with (acute) exacerbation: Secondary | ICD-10-CM | POA: Diagnosis not present

## 2023-06-17 DIAGNOSIS — I10 Essential (primary) hypertension: Secondary | ICD-10-CM | POA: Diagnosis not present

## 2023-06-17 DIAGNOSIS — K219 Gastro-esophageal reflux disease without esophagitis: Secondary | ICD-10-CM | POA: Diagnosis not present

## 2023-06-17 DIAGNOSIS — M1611 Unilateral primary osteoarthritis, right hip: Secondary | ICD-10-CM | POA: Diagnosis not present

## 2023-06-17 MED ORDER — METOPROLOL TARTRATE 100 MG PO TABS: ORAL_TABLET | ORAL | 0 refills | Status: DC

## 2023-06-17 NOTE — Telephone Encounter (Signed)
Called patient back to let her know Dr. Mayford Knife advised to stop lasix until she see's Tereso Newcomer PA, next week. Patient also stated she took the one time dose of metoprolol already. Patient stated it was unclear on when she was suppose to take it. Informed patient that another pill would be sent in and to only take it on the day of her CT. CT has not be scheduled yet.

## 2023-06-21 DIAGNOSIS — M1611 Unilateral primary osteoarthritis, right hip: Secondary | ICD-10-CM | POA: Diagnosis not present

## 2023-06-21 DIAGNOSIS — J441 Chronic obstructive pulmonary disease with (acute) exacerbation: Secondary | ICD-10-CM | POA: Diagnosis not present

## 2023-06-21 DIAGNOSIS — Z7982 Long term (current) use of aspirin: Secondary | ICD-10-CM | POA: Diagnosis not present

## 2023-06-21 DIAGNOSIS — Z7951 Long term (current) use of inhaled steroids: Secondary | ICD-10-CM | POA: Diagnosis not present

## 2023-06-21 DIAGNOSIS — I10 Essential (primary) hypertension: Secondary | ICD-10-CM | POA: Diagnosis not present

## 2023-06-21 DIAGNOSIS — J9601 Acute respiratory failure with hypoxia: Secondary | ICD-10-CM | POA: Diagnosis not present

## 2023-06-21 DIAGNOSIS — Z9181 History of falling: Secondary | ICD-10-CM | POA: Diagnosis not present

## 2023-06-21 DIAGNOSIS — K219 Gastro-esophageal reflux disease without esophagitis: Secondary | ICD-10-CM | POA: Diagnosis not present

## 2023-06-21 DIAGNOSIS — Z7952 Long term (current) use of systemic steroids: Secondary | ICD-10-CM | POA: Diagnosis not present

## 2023-06-21 DIAGNOSIS — J45901 Unspecified asthma with (acute) exacerbation: Secondary | ICD-10-CM | POA: Diagnosis not present

## 2023-06-21 DIAGNOSIS — H269 Unspecified cataract: Secondary | ICD-10-CM | POA: Diagnosis not present

## 2023-06-22 ENCOUNTER — Ambulatory Visit: Payer: Medicare Other | Attending: Physician Assistant | Admitting: Physician Assistant

## 2023-06-22 ENCOUNTER — Encounter: Payer: Self-pay | Admitting: Physician Assistant

## 2023-06-22 VITALS — BP 132/72 | HR 98 | Ht 60.0 in | Wt 171.6 lb

## 2023-06-22 DIAGNOSIS — M25472 Effusion, left ankle: Secondary | ICD-10-CM

## 2023-06-22 DIAGNOSIS — I10 Essential (primary) hypertension: Secondary | ICD-10-CM

## 2023-06-22 DIAGNOSIS — I77810 Thoracic aortic ectasia: Secondary | ICD-10-CM

## 2023-06-22 DIAGNOSIS — I340 Nonrheumatic mitral (valve) insufficiency: Secondary | ICD-10-CM

## 2023-06-22 DIAGNOSIS — M25471 Effusion, right ankle: Secondary | ICD-10-CM

## 2023-06-22 MED ORDER — FUROSEMIDE 20 MG PO TABS: 20.0000 mg | ORAL_TABLET | ORAL | 3 refills | Status: DC

## 2023-06-22 NOTE — Assessment & Plan Note (Signed)
BNP has been normal. She has not had orthopnea, paroxysmal nocturnal dyspnea. I do not think she has congestive heart failure related to valvular heart disease. She did have improvement with Lasix but had side effects to taking it once daily. I have recommended she take Lasix 20 mg every other day to see if she can tolerate this.

## 2023-06-22 NOTE — Assessment & Plan Note (Signed)
CCTA is pending. Will likely need annual echocardiograms to monitor.

## 2023-06-22 NOTE — Assessment & Plan Note (Signed)
BP controlled. Continue Amlodipine 10 mg once daily, Losartan 50 mg once daily.

## 2023-06-22 NOTE — Assessment & Plan Note (Addendum)
Mod to severe on recent echocardiogram. Risks and benefits of TEE d/w pt and her daughter. She is willing to proceed.  Arrange TEE BMET, CBC

## 2023-06-22 NOTE — Progress Notes (Addendum)
Cardiology Office Note:    Date:  06/22/2023  ID:  Dorise Hiss Scotti, DOB [birth year], MRN [id number] PCP: Joycelyn Rua, MD  [facility] Providers Cardiologist:  Armanda Magic, MD       Patient Profile:      Mitral regurgitation TTE 06/04/2023: EF 60-65, no RWMA, G1 DD, NL RVSF, mild pulmonary HTN, RVSP 44.5, mod BAE, mod to severe MR, mod TR, trivial AI, mild aortic stenosis (mean 9.2 mmHg, V-max 207 cm/s), ascending aorta 40 mm, RAP 8 Peripheral arterial disease  Chronic kidney disease  Chronic Obstructive Pulmonary Disease  Diabetes mellitus  Hyperlipidemia  Hypertension  Sickle cell anemia GERD        History of Present Illness:   Mary Chase is a 88 y.o. female who returns for follow up on mitral regurgitation. She was seen by Dr. Mayford Knife 05/28/23 for evaluation of shortness of breath.  She had been hospitalized in May 2024 with acute hypoxic respiratory failure due to severe asthma exacerbation.  Coronary CTA is currently pending.  2D echocardiogram demonstrated normal EF, moderate to severe MR, moderate TR, mild aortic stenosis, mildly dilated ascending aorta.  Dr. Mayford Knife has requested the patient undergo transesophageal echocardiogram to further evaluate mitral regurgitation.  CCTA is scheduled for 06/29/2023.  She is here with her daughter. She has occasional L sided chest pain that is very brief and not assoc w exertion.  She has not had exertional pressure or heaviness. She has chronic shortness of breath without change. Her edema improved with Lasix. However, she felt emotional with it an stopped. She has not had syncope, orthopnea, paroxysmal nocturnal dyspnea.   Review of Systems  Neurological:  Positive for vertigo.   See HPI     Studies Reviewed:   EKG Interpretation Date/Time:  Tuesday June 22 2023 14:06:41 EDT Ventricular Rate:  98 PR Interval:  160 QRS Duration:  74 QT Interval:  344 QTC Calculation: 439 R Axis:   19  Text Interpretation: Normal sinus  rhythm Normal axis No ST-TW changes  Confirmed by Tereso Newcomer [phone]) on 06/22/2023 2:14:13 PM   Risk Assessment/Calculations:             Physical Exam:   VS:  BP 132/72   Pulse 98   Ht 5' (1.524 m)   Wt 171 lb 9.6 oz (77.8 kg)   SpO2 97%   BMI 33.51 kg/m    Wt Readings from Last 3 Encounters:  06/22/23 171 lb 9.6 oz (77.8 kg)  05/28/23 174 lb 3.2 oz (79 kg)  05/27/23 177 lb 4 oz (80.4 kg)    Constitutional:      Appearance: Healthy appearance. Not in distress.  Neck:     Vascular: JVR present. JVD normal.  Pulmonary:     Breath sounds: Normal breath sounds. No wheezing. No rales.  Cardiovascular:     Normal rate. Regular rhythm.     Murmurs: There is a grade 3/6 systolic murmur at the LLSB, radiating to the axilla.  Edema:    Peripheral edema present.    Ankle: bilateral 1+ edema of the ankle. Abdominal:     Palpations: Abdomen is soft.      ASSESSMENT AND PLAN:   Mitral regurgitation Mod to severe on recent echocardiogram. Risks and benefits of TEE d/w pt and her daughter. She is willing to proceed.  Arrange TEE BMET, CBC  Edema of both ankles BNP has been normal. She has not had orthopnea, paroxysmal nocturnal dyspnea. I do not think  she has congestive heart failure related to valvular heart disease. She did have improvement with Lasix but had side effects to taking it once daily. I have recommended she take Lasix 20 mg every other day to see if she can tolerate this.   Ascending aorta dilatation (HCC) CCTA is pending. Will likely need annual echocardiograms to monitor.   HTN (hypertension) BP controlled. Continue Amlodipine 10 mg once daily, Losartan 50 mg once daily.     Informed Consent   Shared Decision Making/Informed Consent The risks [esophageal damage, perforation (1:10,000 risk), bleeding, pharyngeal hematoma as well as other potential complications associated with conscious sedation including aspiration, arrhythmia, respiratory failure and death],  benefits (treatment guidance and diagnostic support) and alternatives of a transesophageal echocardiogram were discussed in detail with Ms. Milazzo and she is willing to proceed.      Dispo:  Return in about 8 weeks (around 08/17/2023) for Follow up after testing w/ Dr. Mayford Knife, or Tereso Newcomer, PA-C.  Signed, Tereso Newcomer, PA-C

## 2023-06-22 NOTE — Patient Instructions (Signed)
Medication Instructions:  Your physician has recommended you make the following change in your medication:  DECREASE lasix 20mg  taking every other day.   *If you need a refill on your cardiac medications before your next appointment, please call your pharmacy*   Lab Work: CBC and BMET today.  If you have labs (blood work) drawn today and your tests are completely normal, you will receive your results only by: MyChart Message (if you have MyChart) OR A paper copy in the mail If you have any lab test that is abnormal or we need to change your treatment, we will call you to review the results.   Testing/Procedures:     Dear Mary Chase  You are scheduled for a TEE (Transesophageal Echocardiogram) on Wednesday, July 10 with Dr. Servando Salina.  Please arrive at the [facility] (Main Entrance A) at [facility]: [address] [place], [place] [address] at 7:30 AM (This time is 1.5 hour(s) before your procedure to ensure your preparation). Free valet parking service is available. You will check in at ADMITTING. The support person will be asked to wait in the waiting room.  It is OK to have someone drop you off and come back when you are ready to be discharged.      DIET:  Nothing to eat or drink after midnight except a sip of water with medications (see medication instructions below)  MEDICATION INSTRUCTIONS: !!IF ANY NEW MEDICATIONS ARE STARTED AFTER TODAY, PLEASE NOTIFY YOUR PROVIDER AS SOON AS POSSIBLE!!  FYI: Medications such as Semaglutide (Ozempic, [place]), Tirzepatide (Mounjaro, Zepbound), Dulaglutide (Trulicity), etc ("GLP1 agonists") AND Canagliflozin (Invokana), Dapagliflozin (Farxiga), Empagliflozin (Jardiance), Ertugliflozin (Steglatro), Bexagliflozin [employer]) or any combination with one of these drugs such as Invokamet (Canagliflozin/Metformin), Synjardy (Empagliflozin/Metformin), etc ("SGLT2 inhibitors") must be held around the time of a procedure. This is not a comprehensive  list of all of these drugs. Please review all of your medications and talk to your provider if you take any one of these. If you are not sure, ask your provider.  HOLD lasix on the morning of procedure.       LABS: Being done today   FYI:  For your safety, and to allow [place] to monitor your vital signs accurately during the surgery/procedure we request: If you have artificial nails, gel coating, SNS etc, please have those removed prior to your surgery/procedure. Not having the nail coverings /polish removed may result in cancellation or delay of your surgery/procedure.  You must have a responsible person to drive you home and stay in the waiting area during your procedure. Failure to do so could result in cancellation.  Bring your insurance cards.  *Special Note: Every effort is made to have your procedure done on time. Occasionally there are emergencies that occur at the hospital that may cause delays. Please be patient if a delay does occur.       Follow-Up: At [facility], you and your health needs are our priority.  As part of our continuing mission to provide you with exceptional heart care, we have created designated Provider Care Teams.  These Care Teams include your primary Cardiologist (physician) and Advanced Practice Providers (APPs -  Physician Assistants and Nurse Practitioners) who all work together to provide you with the care you need, when you need it.  We recommend signing up for the patient portal called "MyChart".  Sign up information is provided on this After Visit Summary.  MyChart is used to connect with patients for  Virtual Visits (Telemedicine).  Patients are able to view lab/test results, encounter notes, upcoming appointments, etc.  Non-urgent messages can be sent to your provider as well.   To learn more about what you can do with MyChart, go to [email or web address].    Your next appointment:   6 week(s)  Provider:   Tereso Newcomer, PA-C

## 2023-06-23 LAB — BASIC METABOLIC PANEL
BUN/Creatinine Ratio: 8 — ABNORMAL LOW (ref 12–28)
BUN: 9 mg/dL (ref 8–27)
CO2: 23 mmol/L (ref 20–29)
Calcium: 9.3 mg/dL (ref 8.7–10.3)
Chloride: 104 mmol/L (ref 96–106)
Creatinine, Ser: 1.1 mg/dL — ABNORMAL HIGH (ref 0.57–1.00)
Glucose: 92 mg/dL (ref 70–99)
Potassium: 3.9 mmol/L (ref 3.5–5.2)
Sodium: 142 mmol/L (ref 134–144)
eGFR: 49 mL/min/{1.73_m2} — ABNORMAL LOW (ref >59)

## 2023-06-23 LAB — CBC
Hematocrit: 32.1 % — ABNORMAL LOW (ref 34.0–46.6)
Hemoglobin: 10.6 g/dL — ABNORMAL LOW (ref 11.1–15.9)
MCH: 31.7 pg (ref 26.6–33.0)
MCHC: 33 g/dL (ref 31.5–35.7)
MCV: 96 fL (ref 79–97)
Platelets: 304 10*3/uL (ref 150–450)
RBC: 3.34 x10E6/uL — ABNORMAL LOW (ref 3.77–5.28)
RDW: 14.1 % (ref 11.7–15.4)
WBC: 10.5 10*3/uL (ref 3.4–10.8)

## 2023-06-25 ENCOUNTER — Telehealth: Payer: Self-pay | Admitting: Cardiology

## 2023-06-25 ENCOUNTER — Encounter (HOSPITAL_COMMUNITY): Payer: Self-pay

## 2023-06-25 NOTE — Telephone Encounter (Signed)
Take 100 mg metoprolol (Lopressor) two hours prior to test.   Called and gave pt above information, she has no further questions. Told her that she has a message on MyChart and needs to review that for important information prior to CT scan.

## 2023-06-25 NOTE — Telephone Encounter (Signed)
Pt states she would like to know when she has to take a pill for before the CT scan. Please advise.

## 2023-06-28 ENCOUNTER — Telehealth (HOSPITAL_COMMUNITY): Payer: Self-pay | Admitting: *Deleted

## 2023-06-28 ENCOUNTER — Telehealth: Payer: Self-pay | Admitting: Physician Assistant

## 2023-06-28 DIAGNOSIS — Z79899 Other long term (current) drug therapy: Secondary | ICD-10-CM

## 2023-06-28 NOTE — Telephone Encounter (Signed)
Attempted to call patient regarding upcoming cardiac CT appointment. °Left message on voicemail with name and callback number ° °Konya Fauble RN Navigator Cardiac Imaging °[facility] °[phone] Office °[phone] Cell ° °

## 2023-06-28 NOTE — Telephone Encounter (Signed)
Patient returning call about her upcoming cardiac imaging study; pt verbalizes understanding of appt date/time, parking situation and where to check in, pre-test NPO status and medications ordered, and verified current allergies; name and call back number provided for further questions should they arise  Larey Brick RN Navigator Cardiac Imaging [facility] [phone] office [phone] cell  Patient to take 100mg  metoprolol tartrate two hours prior to her cardiac CT scan. She is aware to arrive at 3 PM.

## 2023-06-28 NOTE — Telephone Encounter (Signed)
Please call patient to get details, cancel procedure and reschedule. Tereso Newcomer, PA-C    06/28/2023 12:39 PM

## 2023-06-28 NOTE — Telephone Encounter (Signed)
Patient would like to reschedule or postpone her procedure Wednesday 7/10.

## 2023-06-29 ENCOUNTER — Ambulatory Visit (HOSPITAL_COMMUNITY): Admission: RE | Admit: 2023-06-29 | Payer: Medicare Other | Source: Ambulatory Visit

## 2023-06-29 ENCOUNTER — Ambulatory Visit (HOSPITAL_COMMUNITY)
Admission: RE | Admit: 2023-06-29 | Discharge: 2023-06-29 | Disposition: A | Discharge status: Home / Self Care | Payer: Medicare Other | Source: Ambulatory Visit | Attending: Cardiology | Admitting: Cardiology

## 2023-06-29 DIAGNOSIS — I2510 Atherosclerotic heart disease of native coronary artery without angina pectoris: Secondary | ICD-10-CM | POA: Diagnosis not present

## 2023-06-29 DIAGNOSIS — R0602 Shortness of breath: Secondary | ICD-10-CM | POA: Insufficient documentation

## 2023-06-29 MED ORDER — DILTIAZEM HCL 25 MG/5ML IV SOLN
10.0000 mg | INTRAVENOUS | Status: AC | PRN
  Administered 2023-06-29: 7.5 mg via INTRAVENOUS
  Administered 2023-06-29: 5 mg via INTRAVENOUS

## 2023-06-29 MED ORDER — DILTIAZEM HCL 25 MG/5ML IV SOLN
INTRAVENOUS | Status: AC
  Filled 2023-06-29: qty 5

## 2023-06-29 MED ORDER — IOHEXOL 350 MG/ML SOLN
95.0000 mL | Freq: Once | INTRAVENOUS | Status: AC | PRN
  Administered 2023-06-29: 95 mL via INTRAVENOUS

## 2023-06-29 MED ORDER — NITROGLYCERIN 0.4 MG SL SUBL
SUBLINGUAL_TABLET | SUBLINGUAL | Status: AC
  Filled 2023-06-29: qty 2

## 2023-06-29 MED ORDER — NITROGLYCERIN 0.4 MG SL SUBL
0.8000 mg | SUBLINGUAL_TABLET | SUBLINGUAL | Status: DC | PRN
  Administered 2023-06-29: 0.8 mg via SUBLINGUAL

## 2023-06-29 NOTE — Progress Notes (Signed)
Pt verbalized understanding of discharge instruction; opportunity for questions provided ?

## 2023-06-29 NOTE — Telephone Encounter (Signed)
Victorino Dike Can you make sure her TEE is rescheduled? Thanks, Tereso Newcomer, PA-C    06/29/2023 8:04 AM

## 2023-06-29 NOTE — Telephone Encounter (Signed)
Ok. Tereso Newcomer, PA-C    06/29/2023 8:46 AM

## 2023-06-29 NOTE — Telephone Encounter (Signed)
Pt would like to postpone her TEE for 06/29/23 until after her Cardiac CT is done today and see what it shows.  Have called scheduling and spoke with Selena Batten and have cancelled the TEE.

## 2023-06-30 ENCOUNTER — Encounter: Payer: Self-pay | Admitting: Cardiology

## 2023-06-30 ENCOUNTER — Ambulatory Visit (HOSPITAL_COMMUNITY): Admission: RE | Admit: 2023-06-30 | Payer: Medicare Other | Source: Home / Self Care | Admitting: Cardiology

## 2023-06-30 ENCOUNTER — Encounter (HOSPITAL_COMMUNITY): Admission: RE | Payer: Self-pay | Source: Home / Self Care

## 2023-06-30 DIAGNOSIS — I2510 Atherosclerotic heart disease of native coronary artery without angina pectoris: Secondary | ICD-10-CM | POA: Insufficient documentation

## 2023-06-30 SURGERY — ECHOCARDIOGRAM, TRANSESOPHAGEAL
Anesthesia: Monitor Anesthesia Care

## 2023-07-01 ENCOUNTER — Telehealth: Payer: Self-pay

## 2023-07-01 DIAGNOSIS — I2510 Atherosclerotic heart disease of native coronary artery without angina pectoris: Secondary | ICD-10-CM

## 2023-07-01 NOTE — Telephone Encounter (Signed)
Spoke with patient to review CTA results and recommendations from Dr. Mayford Knife. Patient will come in on 7/17 for FLP and Alt.  Patient verbalized understanding and had no questions.

## 2023-07-01 NOTE — Telephone Encounter (Signed)
-----   Message from Traci Turner sent at 06/30/2023  9:58 AM EDT ----- Coronary CTA showed coronary Ca score of 452 with <25% stenosis of prox to mid LAD and 25-49% prox RCA.  Continue ASA and get an FLP and ALT. 

## 2023-07-01 NOTE — Telephone Encounter (Signed)
Left message for return call for CTA results.

## 2023-07-01 NOTE — Telephone Encounter (Signed)
-----   Message from Armanda Magic sent at 06/30/2023  9:58 AM EDT ----- Coronary CTA showed coronary Ca score of 452 with <25% stenosis of prox to mid LAD and 25-49% prox RCA.  Continue ASA and get an FLP and ALT.

## 2023-07-02 NOTE — Telephone Encounter (Signed)
CCTA demonstrated nonobstructive coronary artery disease. Please reschedule TEE. Tereso Newcomer, PA-C    07/02/2023 10:55 AM

## 2023-07-02 NOTE — Addendum Note (Signed)
Addended by: Burnetta Sabin on: 07/02/2023 01:49 PM   Modules accepted: Orders

## 2023-07-02 NOTE — Telephone Encounter (Signed)
Spoke with pt.  Tried to reschedule her TEE.  Pt doesn't want to reschedule until she can see / talk to Dr. Mayford Knife.  Tried to find an appointment for Dr. Mayford Knife, but couldn't find one.  I advised pt that I would send to Dr. Norris Cross RN and she can either find a appointment or see if Dr. Mayford Knife would call her and speak to her.

## 2023-07-05 ENCOUNTER — Observation Stay (HOSPITAL_COMMUNITY)
Admission: EM | Admit: 2023-07-05 | Discharge: 2023-07-06 | Disposition: A | Discharge status: Home Health | Payer: Medicare Other | Attending: Internal Medicine | Admitting: Internal Medicine

## 2023-07-05 ENCOUNTER — Other Ambulatory Visit: Payer: Self-pay

## 2023-07-05 ENCOUNTER — Ambulatory Visit: Payer: Medicare Other

## 2023-07-05 ENCOUNTER — Emergency Department (HOSPITAL_COMMUNITY): Payer: Medicare Other

## 2023-07-05 ENCOUNTER — Telehealth: Payer: Self-pay | Admitting: Allergy & Immunology

## 2023-07-05 ENCOUNTER — Encounter (HOSPITAL_COMMUNITY): Payer: Self-pay | Admitting: Internal Medicine

## 2023-07-05 DIAGNOSIS — I2510 Atherosclerotic heart disease of native coronary artery without angina pectoris: Secondary | ICD-10-CM | POA: Diagnosis not present

## 2023-07-05 DIAGNOSIS — Z7982 Long term (current) use of aspirin: Secondary | ICD-10-CM | POA: Insufficient documentation

## 2023-07-05 DIAGNOSIS — J441 Chronic obstructive pulmonary disease with (acute) exacerbation: Secondary | ICD-10-CM | POA: Diagnosis present

## 2023-07-05 DIAGNOSIS — Z79899 Other long term (current) drug therapy: Secondary | ICD-10-CM | POA: Insufficient documentation

## 2023-07-05 DIAGNOSIS — R079 Chest pain, unspecified: Secondary | ICD-10-CM | POA: Diagnosis not present

## 2023-07-05 DIAGNOSIS — I10 Essential (primary) hypertension: Secondary | ICD-10-CM | POA: Diagnosis present

## 2023-07-05 DIAGNOSIS — E1122 Type 2 diabetes mellitus with diabetic chronic kidney disease: Secondary | ICD-10-CM | POA: Insufficient documentation

## 2023-07-05 DIAGNOSIS — J9601 Acute respiratory failure with hypoxia: Principal | ICD-10-CM | POA: Diagnosis present

## 2023-07-05 DIAGNOSIS — I129 Hypertensive chronic kidney disease with stage 1 through stage 4 chronic kidney disease, or unspecified chronic kidney disease: Secondary | ICD-10-CM | POA: Diagnosis not present

## 2023-07-05 DIAGNOSIS — Z1152 Encounter for screening for COVID-19: Secondary | ICD-10-CM | POA: Diagnosis not present

## 2023-07-05 DIAGNOSIS — J4541 Moderate persistent asthma with (acute) exacerbation: Secondary | ICD-10-CM | POA: Diagnosis present

## 2023-07-05 DIAGNOSIS — N1831 Chronic kidney disease, stage 3a: Secondary | ICD-10-CM | POA: Diagnosis not present

## 2023-07-05 DIAGNOSIS — Z96641 Presence of right artificial hip joint: Secondary | ICD-10-CM | POA: Diagnosis not present

## 2023-07-05 DIAGNOSIS — R0602 Shortness of breath: Secondary | ICD-10-CM | POA: Diagnosis not present

## 2023-07-05 DIAGNOSIS — J45901 Unspecified asthma with (acute) exacerbation: Secondary | ICD-10-CM | POA: Diagnosis present

## 2023-07-05 LAB — RESPIRATORY PANEL BY PCR

## 2023-07-05 LAB — BASIC METABOLIC PANEL
Anion gap: 11 (ref 5–15)
BUN: 11 mg/dL (ref 8–23)
CO2: 23 mmol/L (ref 22–32)
Calcium: 9.3 mg/dL (ref 8.9–10.3)
Chloride: 106 mmol/L (ref 98–111)
Creatinine, Ser: 1.11 mg/dL — ABNORMAL HIGH (ref 0.44–1.00)
GFR, Estimated: 49 mL/min — ABNORMAL LOW (ref >60)
Glucose, Bld: 120 mg/dL — ABNORMAL HIGH (ref 70–99)
Potassium: 3.6 mmol/L (ref 3.5–5.1)
Sodium: 140 mmol/L (ref 135–145)

## 2023-07-05 LAB — CULTURE, RESPIRATORY W GRAM STAIN

## 2023-07-05 LAB — BRAIN NATRIURETIC PEPTIDE: B Natriuretic Peptide: 17.4 pg/mL (ref 0.0–100.0)

## 2023-07-05 LAB — EXPECTORATED SPUTUM ASSESSMENT W GRAM STAIN, RFLX TO RESP C

## 2023-07-05 LAB — CBC
HCT: 36.1 % (ref 36.0–46.0)
Hemoglobin: 11.6 g/dL — ABNORMAL LOW (ref 12.0–15.0)
MCH: 30.7 pg (ref 26.0–34.0)
MCHC: 32.1 g/dL (ref 30.0–36.0)
MCV: 95.5 fL (ref 80.0–100.0)
Platelets: 228 10*3/uL (ref 150–400)
RBC: 3.78 MIL/uL — ABNORMAL LOW (ref 3.87–5.11)
RDW: 14.3 % (ref 11.5–15.5)
WBC: 11.3 10*3/uL — ABNORMAL HIGH (ref 4.0–10.5)
nRBC: 0 % (ref 0.0–0.2)

## 2023-07-05 LAB — RESP PANEL BY RT-PCR (RSV, FLU A&B, COVID)  RVPGX2
Influenza A by PCR: NEGATIVE
Influenza B by PCR: NEGATIVE
Resp Syncytial Virus by PCR: NEGATIVE
SARS Coronavirus 2 by RT PCR: NEGATIVE

## 2023-07-05 LAB — LACTIC ACID, PLASMA: Lactic Acid, Venous: 1.1 mmol/L (ref 0.5–1.9)

## 2023-07-05 LAB — TROPONIN I (HIGH SENSITIVITY)
Troponin I (High Sensitivity): 6 ng/L (ref <18)
Troponin I (High Sensitivity): 6 ng/L (ref <18)

## 2023-07-05 MED ORDER — ACETAMINOPHEN 650 MG RE SUPP: 650.0000 mg | Freq: Four times a day (QID) | RECTAL | Status: DC | PRN

## 2023-07-05 MED ORDER — ENOXAPARIN SODIUM 40 MG/0.4ML IJ SOSY
40.0000 mg | PREFILLED_SYRINGE | INTRAMUSCULAR | Status: DC
  Administered 2023-07-05: 40 mg via SUBCUTANEOUS
  Filled 2023-07-05: qty 0.4

## 2023-07-05 MED ORDER — FLUTICASONE PROPIONATE 50 MCG/ACT NA SUSP
1.0000 | Freq: Two times a day (BID) | NASAL | Status: DC
  Administered 2023-07-05 – 2023-07-06 (×2): 1 via NASAL
  Filled 2023-07-05: qty 16

## 2023-07-05 MED ORDER — MONTELUKAST SODIUM 10 MG PO TABS
10.0000 mg | ORAL_TABLET | Freq: Every day | ORAL | Status: DC
  Administered 2023-07-05: 10 mg via ORAL
  Filled 2023-07-05: qty 1

## 2023-07-05 MED ORDER — AMLODIPINE BESYLATE 10 MG PO TABS
10.0000 mg | ORAL_TABLET | Freq: Every day | ORAL | Status: DC
  Administered 2023-07-05 – 2023-07-06 (×2): 10 mg via ORAL
  Filled 2023-07-05: qty 1
  Filled 2023-07-05: qty 2

## 2023-07-05 MED ORDER — POLYETHYLENE GLYCOL 3350 17 G PO PACK: 17.0000 g | PACK | Freq: Every day | ORAL | Status: DC | PRN

## 2023-07-05 MED ORDER — MAGNESIUM SULFATE 2 GM/50ML IV SOLN
2.0000 g | Freq: Once | INTRAVENOUS | Status: AC
  Administered 2023-07-05: 2 g via INTRAVENOUS
  Filled 2023-07-05: qty 50

## 2023-07-05 MED ORDER — SODIUM CHLORIDE 0.9% FLUSH: 3.0000 mL | INTRAVENOUS | Status: DC | PRN

## 2023-07-05 MED ORDER — ONDANSETRON HCL 4 MG PO TABS: 4.0000 mg | ORAL_TABLET | Freq: Four times a day (QID) | ORAL | Status: DC | PRN

## 2023-07-05 MED ORDER — DOXYCYCLINE HYCLATE 100 MG PO TABS
100.0000 mg | ORAL_TABLET | Freq: Two times a day (BID) | ORAL | Status: DC
  Administered 2023-07-05 – 2023-07-06 (×2): 100 mg via ORAL
  Filled 2023-07-05 (×2): qty 1

## 2023-07-05 MED ORDER — ORAL CARE MOUTH RINSE: 15.0000 mL | OROMUCOSAL | Status: DC | PRN

## 2023-07-05 MED ORDER — ASPIRIN 81 MG PO TBEC
81.0000 mg | DELAYED_RELEASE_TABLET | Freq: Two times a day (BID) | ORAL | Status: DC
  Administered 2023-07-05 – 2023-07-06 (×2): 81 mg via ORAL
  Filled 2023-07-05 (×2): qty 1

## 2023-07-05 MED ORDER — GUAIFENESIN ER 600 MG PO TB12
600.0000 mg | ORAL_TABLET | Freq: Two times a day (BID) | ORAL | Status: DC
  Administered 2023-07-05 – 2023-07-06 (×2): 600 mg via ORAL
  Filled 2023-07-05 (×2): qty 1

## 2023-07-05 MED ORDER — SODIUM CHLORIDE 0.9% FLUSH
3.0000 mL | Freq: Two times a day (BID) | INTRAVENOUS | Status: DC
  Administered 2023-07-05 – 2023-07-06 (×2): 3 mL via INTRAVENOUS

## 2023-07-05 MED ORDER — ONDANSETRON HCL 4 MG/2ML IJ SOLN: 4.0000 mg | Freq: Four times a day (QID) | INTRAMUSCULAR | Status: DC | PRN

## 2023-07-05 MED ORDER — METHYLPREDNISOLONE SODIUM SUCC 125 MG IJ SOLR
125.0000 mg | INTRAMUSCULAR | Status: AC
  Administered 2023-07-05: 125 mg via INTRAVENOUS
  Filled 2023-07-05: qty 2

## 2023-07-05 MED ORDER — BENZONATATE 100 MG PO CAPS: 100.0000 mg | ORAL_CAPSULE | Freq: Three times a day (TID) | ORAL | Status: DC | PRN

## 2023-07-05 MED ORDER — LOSARTAN POTASSIUM 50 MG PO TABS
50.0000 mg | ORAL_TABLET | Freq: Every day | ORAL | Status: DC
  Administered 2023-07-05 – 2023-07-06 (×2): 50 mg via ORAL
  Filled 2023-07-05 (×2): qty 1

## 2023-07-05 MED ORDER — MOMETASONE FURO-FORMOTEROL FUM 200-5 MCG/ACT IN AERO
2.0000 | INHALATION_SPRAY | Freq: Two times a day (BID) | RESPIRATORY_TRACT | Status: DC
  Administered 2023-07-05 – 2023-07-06 (×2): 2 via RESPIRATORY_TRACT
  Filled 2023-07-05: qty 8.8

## 2023-07-05 MED ORDER — MONTELUKAST SODIUM 10 MG PO TABS
10.0000 mg | ORAL_TABLET | Freq: Every day | ORAL | Status: DC
  Filled 2023-07-05: qty 1

## 2023-07-05 MED ORDER — PREDNISONE 10 MG PO TABS
50.0000 mg | ORAL_TABLET | Freq: Every day | ORAL | Status: DC
  Administered 2023-07-06: 50 mg via ORAL
  Filled 2023-07-05: qty 5

## 2023-07-05 MED ORDER — LEVALBUTEROL HCL 1.25 MG/0.5ML IN NEBU
1.2500 mg | INHALATION_SOLUTION | Freq: Once | RESPIRATORY_TRACT | Status: AC
  Administered 2023-07-05: 1.25 mg via RESPIRATORY_TRACT
  Filled 2023-07-05: qty 0.5

## 2023-07-05 MED ORDER — IPRATROPIUM-ALBUTEROL 0.5-2.5 (3) MG/3ML IN SOLN
3.0000 mL | Freq: Once | RESPIRATORY_TRACT | Status: AC
  Administered 2023-07-05: 3 mL via RESPIRATORY_TRACT
  Filled 2023-07-05: qty 3

## 2023-07-05 MED ORDER — FUROSEMIDE 40 MG PO TABS
20.0000 mg | ORAL_TABLET | ORAL | Status: DC
  Administered 2023-07-05: 20 mg via ORAL
  Filled 2023-07-05: qty 1

## 2023-07-05 MED ORDER — IPRATROPIUM-ALBUTEROL 0.5-2.5 (3) MG/3ML IN SOLN
3.0000 mL | RESPIRATORY_TRACT | Status: DC | PRN
  Administered 2023-07-05 – 2023-07-06 (×2): 3 mL via RESPIRATORY_TRACT
  Filled 2023-07-05 (×2): qty 3

## 2023-07-05 MED ORDER — ACETAMINOPHEN 325 MG PO TABS: 650.0000 mg | ORAL_TABLET | Freq: Four times a day (QID) | ORAL | Status: DC | PRN

## 2023-07-05 MED ORDER — SODIUM CHLORIDE 0.9 % IV SOLN: 250.0000 mL | INTRAVENOUS | Status: DC | PRN

## 2023-07-05 NOTE — ED Provider Notes (Signed)
[facility]ENC[facility] DEPARTMENT AT [facility] Provider Note   CSN: [id number] Arrival date & time: 07/05/23  1156     History Chief Complaint  Patient presents with   Shortness of Breath    Mary Chase is a 88 y.o. female with h/o asthma, COPD, GERD, hypertension, sickle cell trait presents to the ER for evaluation of SOB since yesterday, but worsening today. She reports she tried her Advair today as well as her rescue inhaler without relief of symptoms. She reports that she has had a cough and some chest pain/tightness as well. Denies any fevers or chills. She reports that she has been on prednisone " a lot lately" over the past three months.    Shortness of Breath Associated symptoms: cough   Associated symptoms: no abdominal pain, no chest pain, no fever and no vomiting        Home Medications Prior to Admission medications   Medication Sig Start Date End Date Taking? Authorizing Provider  acetaminophen (TYLENOL) 325 MG tablet Take 2 tablets (650 mg total) by mouth every 6 (six) hours as needed for mild pain (or Fever >/= 101). 05/11/23   Elgergawy, Leana Roe, MD  albuterol (VENTOLIN HFA) 108 (90 Base) MCG/ACT inhaler INHALE 2 PUFFS INTO THE LUNGS EVERY 6 HOURS AS NEEDED FOR WHEEZE 04/30/23   Alfonse Spruce, MD  amLODipine (NORVASC) 10 MG tablet Take 10 mg by mouth daily. 11/23/22   [provider]  aspirin EC 81 MG tablet Take 1 tablet (81 mg total) by mouth 2 (two) times daily. 09/16/20   Allena Katz, PA-C  Cholecalciferol (VITAMIN D3) 25 MCG (1000 UT) CAPS Take 1,000 Units by mouth daily in the afternoon.    [provider]  Cyanocobalamin (VITAMIN B-12) 5000 MCG TBDP Take 5,000 mcg by mouth daily.    [provider]  fluticasone (FLONASE) 50 MCG/ACT nasal spray Place 1 spray into both nostrils daily as needed for allergies or rhinitis. 05/27/23   Alfonse Spruce, MD  fluticasone-salmeterol [facility] INHUB) 250-50 MCG/ACT  AEPB Inhale 1 puff into the lungs in the morning and at bedtime. 05/27/23   Alfonse Spruce, MD  furosemide (LASIX) 20 MG tablet Take 1 tablet (20 mg total) by mouth every other day. Patient taking differently: Take 20 mg by mouth daily. 06/22/23   Tereso Newcomer T, PA-C  losartan (COZAAR) 50 MG tablet Take 1 tablet (50 mg total) by mouth daily. 05/11/23   Elgergawy, Leana Roe, MD  montelukast (SINGULAIR) 10 MG tablet Take 1 tablet (10 mg total) by mouth daily. 09/10/22   Alfonse Spruce, MD      Allergies    Penicillins, Alendronate sodium, Cephalexin, Penicillin g, Sulfa antibiotics, Sulfacetamide sodium, and Other    Review of Systems   Review of Systems  Constitutional:  Negative for chills and fever.  HENT:  Negative for congestion and rhinorrhea.   Respiratory:  Positive for cough and shortness of breath.   Cardiovascular:  Negative for chest pain.  Gastrointestinal:  Negative for abdominal pain, nausea and vomiting.    Physical Exam Updated Vital Signs BP (!) 141/87   Pulse 98   Temp 99.6 F (37.6 C) (Oral)   Resp (!) 24   SpO2 93%  Physical Exam Vitals and nursing note reviewed.  Constitutional:      General: She is not in acute distress.    Appearance: She is not ill-appearing or toxic-appearing.  Cardiovascular:     Rate and  Rhythm: Regular rhythm. Tachycardia present.  Pulmonary:     Effort: Tachypnea present. No respiratory distress.     Breath sounds: Decreased breath sounds and wheezing present. No rhonchi or rales.     Comments: Inspiratory wheezing with some prolonged expiratory wheezing and prolonged expiratory phase.  Patient speaking in full sentences.  Mild tachypnea noted however no respiratory distress.  No rhonchi or rales appreciated. Chest:     Chest wall: No tenderness.  Abdominal:     Palpations: Abdomen is soft.  Musculoskeletal:     Right lower leg: No tenderness. No edema.     Left lower leg: No tenderness. No edema.  Skin:    General:  Skin is warm and dry.  Neurological:     Mental Status: She is alert.     ED Results / Procedures / Treatments   Labs (all labs ordered are listed, but only abnormal results are displayed) Labs Reviewed  BASIC METABOLIC PANEL - Abnormal; Notable for the following components:      Result Value   Glucose, Bld 120 (*)    Creatinine, Ser 1.11 (*)    GFR, Estimated 49 (*)    All other components within normal limits  CBC - Abnormal; Notable for the following components:   WBC 11.3 (*)    RBC 3.78 (*)    Hemoglobin 11.6 (*)    All other components within normal limits  CULTURE, BLOOD (ROUTINE X 2)  CULTURE, BLOOD (ROUTINE X 2)  RESP PANEL BY RT-PCR (RSV, FLU A&B, COVID)  RVPGX2  LACTIC ACID, PLASMA  BRAIN NATRIURETIC PEPTIDE  LACTIC ACID, PLASMA  TROPONIN I (HIGH SENSITIVITY)  TROPONIN I (HIGH SENSITIVITY)    EKG None  Radiology DG Chest 2 View  Result Date: 07/05/2023 CLINICAL DATA:  Shortness of breath.  Chest pain EXAM: CHEST - 2 VIEW COMPARISON:  May 04, 2023. FINDINGS: Stable cardiomediastinal silhouette. No acute pulmonary disease is noted. Bony thorax is unremarkable. IMPRESSION: No active cardiopulmonary disease. Electronically Signed   By: Lupita Raider M.D.   On: 07/05/2023 14:13    Procedures Procedures   Medications Ordered in ED Medications  magnesium sulfate IVPB 2 g 50 mL (2 g Intravenous New Bag/Given 07/05/23 1604)  methylPREDNISolone sodium succinate (SOLU-MEDROL) 125 mg/2 mL injection 125 mg (125 mg Intravenous Given 07/05/23 1432)  ipratropium-albuterol (DUONEB) 0.5-2.5 (3) MG/3ML nebulizer solution 3 mL (3 mLs Nebulization Given 07/05/23 1415)  levalbuterol (XOPENEX) nebulizer solution 1.25 mg (1.25 mg Nebulization Given 07/05/23 1605)    ED Course/ Medical Decision Making/ A&P                            Medical Decision Making Amount and/or Complexity of Data Reviewed Labs: ordered. Radiology: ordered.  Risk Prescription drug  management. Decision regarding hospitalization.   88 y.o. female presents to the ER for evaluation of SOB. Differential diagnosis includes but is not limited to CHF, pericardial effusion/tamponade, arrhythmias, ACS, COPD, asthma, bronchitis, pneumonia, pneumothorax, PE, anemia. Vital signs initially showed temperature 99.6 with tachycardia at 111, normal blood pressure 137/86, satting 88% on room air breathing 20 per minute.  Now heart rate and oxygen have improved.  She is known 98 heart rate with a temperature of 98.6.  Satting 93% with respirate of 24 however patient reports that she is feeling slightly better. Physical exam as noted above.   I independently reviewed and interpreted the patient's labs.  Troponin is 6.  BNP  is 70.4.  CBC does show slightly increased white blood count 11.3.  Slightly decreased hemoglobin 11.6.  BMP shows glucose at 120 and creatinine 1.11.  Creatinine does appear to be on her baseline.  Lactic acid normal at 1.1.  X-ray panel negative for COVID, flu, RSV.  Blood cultures pending.  Chest x-ray shows no active cardiopulmonary process.  On my personal crepitation, do see some haziness seen in the right lower lobe which is different from her previous chest x-rays.  Question infection.  Discussed with hospitalist who recommends discontinuing antibiotics until she evaluates them.  I think this is reasonable.  I considered PE however pain is less likely given the patient's significant wheezing as well as known history of COPD and asthma.  Will leave to hospitalist for any additional imaging if they see necessary.  Less likely any CHF exacerbation given reassuring BNP also patient does not appear to be in any fluid volume overload.  No pleural effusions seen on CT imaging.  Patient is received multiple DuoNebs, Xopenex, Solu-Medrol, and magnesium.  She is only had minimal improvement feeling however her lung sounds have improved some.  On previous chart evaluation, she was  admitted in May 2024 and placed on BiPAP given her asthma and COPD exacerbation.  Concern given her age as well as her lung sounds on initial hypoxia for her going home and worsening.  I think it is reasonable for admission.  Discussed with patient for members at bedside who are reasonable to this.  Admit to [facility] hospitalist.  Portions of this report may have been transcribed using voice recognition software. Every effort was made to ensure accuracy; however, inadvertent computerized transcription errors may be present.   I discussed this case with my attending physician who cosigned this note including patient's presenting symptoms, physical exam, and planned diagnostics and interventions. Attending physician stated agreement with plan or made changes to plan which were implemented.   Attending physician assessed patient at bedside.  Final Clinical Impression(s) / ED Diagnoses Final diagnoses:  Moderate persistent asthma with exacerbation    Rx / DC Orders ED Discharge Orders     None         Achille Rich, PA-C 07/05/23 2346    Pricilla Loveless, MD 07/07/23 1710

## 2023-07-05 NOTE — ED Triage Notes (Signed)
Patient arrives with sob since yesterday. DOE but also sob at rest. Also endorses constant central chest pain since yesterday. Has had productive cough with white mucous x 1 month.

## 2023-07-05 NOTE — H&P (Signed)
History and Physical    Mary Chase VQQ:[id number] DOB: [birth year] DOA: 07/05/2023  PCP: Lorenda Ishihara, MD  Patient coming from: Home  Chief Complaint: SOB  HPI: Mary Chase is a 88 y.o. female with medical history significant of asthma/COPD, CAD, GERD, HTN who presents with SOB. She was hospitalized in May 2024 with symptoms of shortness of breath and wheezing.  At that time, patient was admitted for asthma/COPD exacerbation with respiratory failure, exacerbated by parainfluenza virus.  She did require BiPAP during that hospitalization.   Patient states that since her discharge from the hospital in May, she has never felt like she returned back to her baseline.  Yesterday, she started to feel poorly, dyspnea at rest.  Also with cough.  Denies any fevers, nausea or vomiting.  She admits to some left-sided chest pain that is intermittent in nature and feels like "lightning" spreading anteriorly.  These episodes are short-lived and does not correlate with cough.  She has follow-up with Dr. Isaiah Serge to establish as a new patient 7/17 She has an appointment with Dr. Mayford Knife 7/29 to discuss TEE for mitral valve regurgitation  ED Course: Given IV steroids as well as nebulizer treatments and IV magnesium  Review of Systems: As per HPI. Otherwise, all other review of systems reviewed and are negative.   Past Medical History:  Diagnosis Date   Age related osteoporosis    Allergic rhinitis    Aortic stenosis    Mild by echo 05/2023   Arthritis    Ascending aorta dilatation (HCC)    2D echo 06/10/2023 with ascending aorta measurement 40 mm   Asthma    Atherosclerosis of native artery of both lower extremities with intermittent claudication (HCC)    CAD (coronary artery disease), native coronary artery    coronary Ca score of 452 with <25% stenosis of prox to mid LAD and 25-49% prox RCA by coronary CTA 7/24   Cataract    Chronic kidney disease, stage 3a (HCC)    COPD (chronic  obstructive pulmonary disease) (HCC)    Degenerative joint disease (DJD) of hip    Diabetic renal disease (HCC)    Dyspnea    with exertion   GERD (gastroesophageal reflux disease)    Headache    Hypercholesterolemia    Hypertension    Hypertensive retinopathy    Mitral regurgitation    Moderate to severe by echo 05/2023   Osteoarthritis of hip    Pneumonia    Pre-diabetes    A1c within normal limits last check   Pulmonary hypertension (HCC)    Mild to moderate with PASP 44 mmHg by echo 6-24   PVD (peripheral vascular disease) (HCC)    Sickle cell anemia (HCC)    trait   Sickle cell trait (HCC)    Tricuspid regurgitation    Moderate by echo 06/10/2023    Past Surgical History:  Procedure Laterality Date   ANKLE SURGERY Right 2002   pin   BREAST BIOPSY Right 11/01/2013   Procedure: RIGHT BREAST MASS EXCISION;  Surgeon: Emelia Loron, MD;  Location: [facility] OR;  Service: General;  Laterality: Right;   CATARACT EXTRACTION     ELBOW SURGERY Right    pin   MULTIPLE TOOTH EXTRACTIONS     TOTAL HIP ARTHROPLASTY Right 09/16/2020   Procedure: RIGHT TOTAL HIP ARTHROPLASTY ANTERIOR APPROACH;  Surgeon: Gean Birchwood, MD;  Location: [facility];  Service: Orthopedics;  Laterality: Right;   TUBAL LIGATION       reports  that she has never smoked. She has never used smokeless tobacco. She reports current alcohol use. She reports that she does not use drugs.  Allergies  Allergen Reactions   Penicillins Shortness Of Breath and Rash   Alendronate Sodium     Other Reaction(s): hair loss, muscle aches, SOB,weight issues   Cephalexin Itching    Other Reaction(s): itch   Penicillin G     Other Reaction(s): cannot breathe   Sulfa Antibiotics Itching and Other (See Comments)    Swelling in mouth   Sulfacetamide Sodium     Other Reaction(s): itch   Other Itching    Some beans     No family history on file.  Prior to Admission medications   Medication Sig Start Date End Date Taking?  Authorizing Provider  acetaminophen (TYLENOL) 325 MG tablet Take 2 tablets (650 mg total) by mouth every 6 (six) hours as needed for mild pain (or Fever >/= 101). 05/11/23   Elgergawy, Leana Roe, MD  albuterol (VENTOLIN HFA) 108 (90 Base) MCG/ACT inhaler INHALE 2 PUFFS INTO THE LUNGS EVERY 6 HOURS AS NEEDED FOR WHEEZE 04/30/23   Alfonse Spruce, MD  amLODipine (NORVASC) 10 MG tablet Take 10 mg by mouth daily. 11/23/22   [provider]  aspirin EC 81 MG tablet Take 1 tablet (81 mg total) by mouth 2 (two) times daily. 09/16/20   Allena Katz, PA-C  Cholecalciferol (VITAMIN D3) 25 MCG (1000 UT) CAPS Take 1,000 Units by mouth daily in the afternoon.    [provider]  Cyanocobalamin (VITAMIN B-12) 5000 MCG TBDP Take 5,000 mcg by mouth daily.    [provider]  fluticasone (FLONASE) 50 MCG/ACT nasal spray Place 1 spray into both nostrils daily as needed for allergies or rhinitis. 05/27/23   Alfonse Spruce, MD  fluticasone-salmeterol [facility] INHUB) 250-50 MCG/ACT AEPB Inhale 1 puff into the lungs in the morning and at bedtime. 05/27/23   Alfonse Spruce, MD  furosemide (LASIX) 20 MG tablet Take 1 tablet (20 mg total) by mouth every other day. Patient taking differently: Take 20 mg by mouth daily. 06/22/23   Tereso Newcomer T, PA-C  losartan (COZAAR) 50 MG tablet Take 1 tablet (50 mg total) by mouth daily. 05/11/23   Elgergawy, Leana Roe, MD  montelukast (SINGULAIR) 10 MG tablet Take 1 tablet (10 mg total) by mouth daily. 09/10/22   Alfonse Spruce, MD    Physical Exam: Vitals:   07/05/23 1445 07/05/23 1500 07/05/23 1515 07/05/23 1633  BP: (!) 150/89 (!) 164/98 (!) 141/87   Pulse: (!) 103 (!) 101 98   Resp: (!) 21 (!) 21 (!) 24   Temp:    99.2 F (37.3 C)  TempSrc:    Oral  SpO2: 95% 94% 93%     Constitutional: NAD, calm, comfortable Eyes: PERRL, lids and conjunctivae normal ENMT: Mucous membranes are moist. Normal dentition.  Respiratory: Diminished  breath sounds bilaterally with minimal wheezing.  No rhonchi or crackles.  Normal respiratory effort.  No accessory muscle use.  No conversational dyspnea.  On room air. Cardiovascular: Regular rate and rhythm, no murmurs. No extremity edema.  Abdomen: Soft, nondistended, nontender to palpation. Bowel sounds positive.  Musculoskeletal: No joint deformity upper and lower extremities. No contractures. Normal muscle tone.  Skin: no rashes, lesions, ulcers on exposed skin  Neurologic: Alert and oriented, speech fluent, CN 2-12 grossly intact. No focal deficits.   Psychiatric: Normal judgment and insight. Normal mood and affect  Labs on Admission: I have personally reviewed following labs and imaging studies  CBC: Recent Labs  Lab 07/05/23 1219  WBC 11.3*  HGB 11.6*  HCT 36.1  MCV 95.5  PLT 228   Basic Metabolic Panel: Recent Labs  Lab 07/05/23 1219  NA 140  K 3.6  CL 106  CO2 23  GLUCOSE 120*  BUN 11  CREATININE 1.11*  CALCIUM 9.3   GFR: Estimated Creatinine Clearance: 34.2 mL/min (A) (by C-G formula based on SCr of 1.11 mg/dL (H)). Liver Function Tests: No results for input(s): "AST", "ALT", "ALKPHOS", "BILITOT", "PROT", "ALBUMIN" in the last 168 hours. No results for input(s): "LIPASE", "AMYLASE" in the last 168 hours. No results for input(s): "AMMONIA" in the last 168 hours. Coagulation Profile: No results for input(s): "INR", "PROTIME" in the last 168 hours. Cardiac Enzymes: No results for input(s): "CKTOTAL", "CKMB", "CKMBINDEX", "TROPONINI" in the last 168 hours. BNP (last 3 results) No results for input(s): "PROBNP" in the last 8760 hours. HbA1C: No results for input(s): "HGBA1C" in the last 72 hours. CBG: No results for input(s): "GLUCAP" in the last 168 hours. Lipid Profile: No results for input(s): "CHOL", "HDL", "LDLCALC", "TRIG", "CHOLHDL", "LDLDIRECT" in the last 72 hours. Thyroid Function Tests: No results for input(s): "TSH", "T4TOTAL", "FREET4",  "T3FREE", "THYROIDAB" in the last 72 hours. Anemia Panel: No results for input(s): "VITAMINB12", "FOLATE", "FERRITIN", "TIBC", "IRON", "RETICCTPCT" in the last 72 hours. Urine analysis:    Component Value Date/Time   COLORURINE STRAW (A) 09/10/2020 1426   APPEARANCEUR CLEAR 09/10/2020 1426   LABSPEC 1.005 09/10/2020 1426   PHURINE 8.0 09/10/2020 1426   GLUCOSEU NEGATIVE 09/10/2020 1426   HGBUR NEGATIVE 09/10/2020 1426   BILIRUBINUR NEGATIVE 09/10/2020 1426   KETONESUR NEGATIVE 09/10/2020 1426   PROTEINUR 30 (A) 09/10/2020 1426   NITRITE NEGATIVE 09/10/2020 1426   LEUKOCYTESUR NEGATIVE 09/10/2020 1426   Sepsis Labs: !!!!!!!!!!!!!!!!!!!!!!!!!!!!!!!!!!!!!!!!!!!! @LABRCNTIP (procalcitonin:4,lacticidven:4) ) Recent Results (from the past 240 hour(s))  Resp panel by RT-PCR (RSV, Flu A&B, Covid) Anterior Nasal Swab     Status: None   Collection Time: 07/05/23  3:14 PM   Specimen: Anterior Nasal Swab  Result Value Ref Range Status   SARS Coronavirus 2 by RT PCR NEGATIVE NEGATIVE Final   Influenza A by PCR NEGATIVE NEGATIVE Final   Influenza B by PCR NEGATIVE NEGATIVE Final    Comment: (NOTE) The Xpert Xpress SARS-CoV-2/FLU/RSV plus assay is intended as an aid in the diagnosis of influenza from Nasopharyngeal swab specimens and should not be used as a sole basis for treatment. Nasal washings and aspirates are unacceptable for Xpert Xpress SARS-CoV-2/FLU/RSV testing.  Fact Sheet for Patients: BloggerCourse.com  Fact Sheet for Healthcare Providers: SeriousBroker.it  This test is not yet approved or cleared by the [place] FDA and has been authorized for detection and/or diagnosis of SARS-CoV-2 by FDA under an Emergency Use Authorization (EUA). This EUA will remain in effect (meaning this test can be used) for the duration of the COVID-19 declaration under Section 564(b)(1) of the Act, 21 U.S.C. section 360bbb-3(b)(1), unless the  authorization is terminated or revoked.     Resp Syncytial Virus by PCR NEGATIVE NEGATIVE Final    Comment: (NOTE) Fact Sheet for Patients: BloggerCourse.com  Fact Sheet for Healthcare Providers: SeriousBroker.it  This test is not yet approved or cleared by the [place] FDA and has been authorized for detection and/or diagnosis of SARS-CoV-2 by FDA under an Emergency Use Authorization (EUA). This EUA will remain in effect (meaning this test  can be used) for the duration of the COVID-19 declaration under Section 564(b)(1) of the Act, 21 U.S.C. section 360bbb-3(b)(1), unless the authorization is terminated or revoked.  Performed at [facility], [address] [place], [place] [address]      Radiological Exams on Admission: DG Chest 2 View  Result Date: 07/05/2023 CLINICAL DATA:  Shortness of breath.  Chest pain EXAM: CHEST - 2 VIEW COMPARISON:  May 04, 2023. FINDINGS: Stable cardiomediastinal silhouette. No acute pulmonary disease is noted. Bony thorax is unremarkable. IMPRESSION: No active cardiopulmonary disease. Electronically Signed   By: Lupita Raider M.D.   On: 07/05/2023 14:13    EKG: Independently reviewed. Sinus tachycardia   Assessment/Plan Principal Problem:   Acute respiratory failure with hypoxia (HCC) Active Problems:   HTN (hypertension)   COPD exacerbation (HCC)   CAD (coronary artery disease), native coronary artery   Asthma exacerbation   CKD stage 3a, GFR 45-59 ml/min (HCC)   Acute hypoxemic respiratory failure, SpO2 80% on room air Secondary to asthma/COPD exacerbation -Influenza and RSV and COVID-negative.  Check respiratory viral panel -Breathing treatments -Chest x-ray read as unremarkable -IV Solu-Medrol --> prednisone -Empiric antibiotic doxycycline -IS, flutter valve  CKD stage IIIa -Baseline creatinine around 1-1.1 -Stable  Hypertension -Norvasc, Cozaar, lasix    CAD -Aspirin  Allergies -Singulair, Flonase  Nonrheumatic mitral valve regurgitation -Follow-up with cardiology -Lasix    DVT prophylaxis: Lovenox  Code Status: Full code, per patient as discussed at time of admission Family Communication: 2 daughters at bedside Disposition Plan: Home Consults called: None    Severity of Illness: The appropriate patient status for this patient is INPATIENT. Inpatient status is judged to be reasonable and necessary in order to provide the required intensity of service to ensure the patient's safety. The patient's presenting symptoms, physical exam findings, and initial radiographic and laboratory data in the context of their chronic comorbidities is felt to place them at high risk for further clinical deterioration. Furthermore, it is not anticipated that the patient will be medically stable for discharge from the hospital within 2 midnights of admission.   * I certify that at the point of admission it is my clinical judgment that the patient will require inpatient hospital care spanning beyond 2 midnights from the point of admission due to high intensity of service, high risk for further deterioration and high frequency of surveillance required.Noralee Stain, DO [facility] 07/05/2023, 5:31 PM   Available via Epic secure chat 7am-7pm After these hours, please refer to coverage provider listed on [email or web address]

## 2023-07-05 NOTE — Telephone Encounter (Signed)
Patient called wanted Dr. Dellis Anes to know she was on her way to the hospital for shortness of breath please advise

## 2023-07-05 NOTE — ED Notes (Signed)
ED TO INPATIENT HANDOFF REPORT  ED Nurse Name and Phone #: Juliette Alcide RN [phone]  S Name/Age/Gender Mary Chase 88 y.o. female Room/Bed: 043C/043C  Code Status   Code Status: Full Code  Home/SNF/Other Home Patient oriented to: self, place, time, and situation Is this baseline? Yes   Triage Complete: Triage complete  Chief Complaint Asthma exacerbation [J45.901]  Triage Note Patient arrives with sob since yesterday. DOE but also sob at rest. Also endorses constant central chest pain since yesterday. Has had productive cough with white mucous x 1 month.    Allergies Allergies  Allergen Reactions   Penicillins Shortness Of Breath and Rash   Alendronate Sodium     Other Reaction(s): hair loss, muscle aches, SOB,weight issues   Cephalexin Itching    Other Reaction(s): itch   Penicillin G     Other Reaction(s): cannot breathe   Sulfa Antibiotics Itching and Other (See Comments)    Swelling in mouth   Sulfacetamide Sodium     Other Reaction(s): itch   Other Itching    Some beans     Level of Care/Admitting Diagnosis ED Disposition     ED Disposition  Admit   Condition  --   Comment  Hospital Area: [facility] [[id number]]  Level of Care: Med-Surg [16]  May admit patient to [facility] or [facility] if equivalent level of care is available:: No  Covid Evaluation: Confirmed COVID Negative  Diagnosis: Asthma exacerbation [086578]  Admitting Physician: Noralee Stain [[id number]]  Attending Physician: Noralee Stain [phone]  Certification:: I certify there are rare and unusual circumstances requiring inpatient admission  Estimated Length of Stay: 2          B Medical/Surgery History Past Medical History:  Diagnosis Date   Age related osteoporosis    Allergic rhinitis    Aortic stenosis    Mild by echo 05/2023   Arthritis    Ascending aorta dilatation (HCC)    2D echo 06/10/2023 with ascending aorta measurement 40 mm   Asthma     Atherosclerosis of native artery of both lower extremities with intermittent claudication (HCC)    CAD (coronary artery disease), native coronary artery    coronary Ca score of 452 with <25% stenosis of prox to mid LAD and 25-49% prox RCA by coronary CTA 7/24   Cataract    Chronic kidney disease, stage 3a (HCC)    COPD (chronic obstructive pulmonary disease) (HCC)    Degenerative joint disease (DJD) of hip    Diabetic renal disease (HCC)    Dyspnea    with exertion   GERD (gastroesophageal reflux disease)    Headache    Hypercholesterolemia    Hypertension    Hypertensive retinopathy    Mitral regurgitation    Moderate to severe by echo 05/2023   Osteoarthritis of hip    Pneumonia    Pre-diabetes    A1c within normal limits last check   Pulmonary hypertension (HCC)    Mild to moderate with PASP 44 mmHg by echo 6-24   PVD (peripheral vascular disease) (HCC)    Sickle cell anemia (HCC)    trait   Sickle cell trait (HCC)    Tricuspid regurgitation    Moderate by echo 06/10/2023   Past Surgical History:  Procedure Laterality Date   ANKLE SURGERY Right 2002   pin   BREAST BIOPSY Right 11/01/2013   Procedure: RIGHT BREAST MASS EXCISION;  Surgeon: Emelia Loron, MD;  Location: [facility] OR;  Service:  General;  Laterality: Right;   CATARACT EXTRACTION     ELBOW SURGERY Right    pin   MULTIPLE TOOTH EXTRACTIONS     TOTAL HIP ARTHROPLASTY Right 09/16/2020   Procedure: RIGHT TOTAL HIP ARTHROPLASTY ANTERIOR APPROACH;  Surgeon: Gean Birchwood, MD;  Location: [facility] ORS;  Service: Orthopedics;  Laterality: Right;   TUBAL LIGATION       A IV Location/Drains/Wounds Patient Lines/Drains/Airways Status     Active Line/Drains/Airways     Name Placement date Placement time Site Days   Peripheral IV 07/05/23 20 G Right;Posterior Forearm 07/05/23  1405  Forearm  less than 1   Peripheral IV 07/05/23 20 G Left;Posterior Forearm 07/05/23  1415  Forearm  less than 1            Intake/Output  Last 24 hours No intake or output data in the 24 hours ending 07/05/23 1955  Labs/Imaging Results for orders placed or performed during the hospital encounter of 07/05/23 (from the past 48 hour(s))  Basic metabolic panel     Status: Abnormal   Collection Time: 07/05/23 12:19 PM  Result Value Ref Range   Sodium 140 135 - 145 mmol/L   Potassium 3.6 3.5 - 5.1 mmol/L   Chloride 106 98 - 111 mmol/L   CO2 23 22 - 32 mmol/L   Glucose, Bld 120 (H) 70 - 99 mg/dL    Comment: Glucose reference range applies only to samples taken after fasting for at least 8 hours.   BUN 11 8 - 23 mg/dL   Creatinine, Ser 1.91 (H) 0.44 - 1.00 mg/dL   Calcium 9.3 8.9 - 47.8 mg/dL   GFR, Estimated 49 (L) >60 mL/min    Comment: (NOTE) Calculated using the CKD-EPI Creatinine Equation (2021)    Anion gap 11 5 - 15    Comment: Performed at [facility], [address] [place], [place] [address]  CBC     Status: Abnormal   Collection Time: 07/05/23 12:19 PM  Result Value Ref Range   WBC 11.3 (H) 4.0 - 10.5 K/uL   RBC 3.78 (L) 3.87 - 5.11 MIL/uL   Hemoglobin 11.6 (L) 12.0 - 15.0 g/dL   HCT 13.0 86.5 - 78.4 %   MCV 95.5 80.0 - 100.0 fL   MCH 30.7 26.0 - 34.0 pg   MCHC 32.1 30.0 - 36.0 g/dL   RDW 69.6 29.5 - 28.4 %   Platelets 228 150 - 400 K/uL   nRBC 0.0 0.0 - 0.2 %    Comment: Performed at [facility], [address] [place], [place] [address]  Troponin I (High Sensitivity)     Status: None   Collection Time: 07/05/23 12:19 PM  Result Value Ref Range   Troponin I (High Sensitivity) 6 <18 ng/L    Comment: (NOTE) Elevated high sensitivity troponin I (hsTnI) values and significant  changes across serial measurements may suggest ACS but many other  chronic and acute conditions are known to elevate hsTnI results.  Refer to the "Links" section for chest pain algorithms and additional  guidance. Performed at [facility], [address], [place], [place] [address]   Brain natriuretic  peptide     Status: None   Collection Time: 07/05/23 12:19 PM  Result Value Ref Range   B Natriuretic Peptide 17.4 0.0 - 100.0 pg/mL    Comment: Performed at [facility], [address], [place], [place] [address]  Troponin I (High Sensitivity)  Status: None   Collection Time: 07/05/23  2:05 PM  Result Value Ref Range   Troponin I (High Sensitivity) 6 <18 ng/L    Comment: (NOTE) Elevated high sensitivity troponin I (hsTnI) values and significant  changes across serial measurements may suggest ACS but many other  chronic and acute conditions are known to elevate hsTnI results.  Refer to the "Links" section for chest pain algorithms and additional  guidance. Performed at [facility], [address], [place], [place] [address]   Lactic acid, plasma     Status: None   Collection Time: 07/05/23  2:22 PM  Result Value Ref Range   Lactic Acid, Venous 1.1 0.5 - 1.9 mmol/L    Comment: Performed at [facility], [address], [place], [place] [address]  Resp panel by RT-PCR (RSV, Flu A&B, Covid) Anterior Nasal Swab     Status: None   Collection Time: 07/05/23  3:14 PM   Specimen: Anterior Nasal Swab  Result Value Ref Range   SARS Coronavirus 2 by RT PCR NEGATIVE NEGATIVE   Influenza A by PCR NEGATIVE NEGATIVE   Influenza B by PCR NEGATIVE NEGATIVE    Comment: (NOTE) The Xpert Xpress SARS-CoV-2/FLU/RSV plus assay is intended as an aid in the diagnosis of influenza from Nasopharyngeal swab specimens and should not be used as a sole basis for treatment. Nasal washings and aspirates are unacceptable for Xpert Xpress SARS-CoV-2/FLU/RSV testing.  Fact Sheet for Patients: BloggerCourse.com  Fact Sheet for Healthcare Providers: SeriousBroker.it  This test is not yet approved or cleared by the [place] FDA and has been authorized for detection and/or diagnosis of SARS-CoV-2 by FDA under an Emergency Use  Authorization (EUA). This EUA will remain in effect (meaning this test can be used) for the duration of the COVID-19 declaration under Section 564(b)(1) of the Act, 21 U.S.C. section 360bbb-3(b)(1), unless the authorization is terminated or revoked.     Resp Syncytial Virus by PCR NEGATIVE NEGATIVE    Comment: (NOTE) Fact Sheet for Patients: BloggerCourse.com  Fact Sheet for Healthcare Providers: SeriousBroker.it  This test is not yet approved or cleared by the [place] FDA and has been authorized for detection and/or diagnosis of SARS-CoV-2 by FDA under an Emergency Use Authorization (EUA). This EUA will remain in effect (meaning this test can be used) for the duration of the COVID-19 declaration under Section 564(b)(1) of the Act, 21 U.S.C. section 360bbb-3(b)(1), unless the authorization is terminated or revoked.  Performed at [facility], [address] [place], [place] [address]    DG Chest 2 View  Result Date: 07/05/2023 CLINICAL DATA:  Shortness of breath.  Chest pain EXAM: CHEST - 2 VIEW COMPARISON:  May 04, 2023. FINDINGS: Stable cardiomediastinal silhouette. No acute pulmonary disease is noted. Bony thorax is unremarkable. IMPRESSION: No active cardiopulmonary disease. Electronically Signed   By: Lupita Raider M.D.   On: 07/05/2023 14:13    Pending Labs Unresulted Labs (From admission, onward)     Start     Ordered   07/06/23 0500  CBC  Tomorrow morning,   R        07/05/23 1931   07/06/23 0500  Basic metabolic panel  Tomorrow morning,   R        07/05/23 1931   07/05/23 1932  Expectorated Sputum Assessment w Gram Stain, Rflx to Resp Cult  (COPD / Pneumonia / Cellulitis / Lower Extremity Wound)  Once,   R  07/05/23 1931   07/05/23 1729  Respiratory (~20 pathogens) panel by PCR  (Respiratory panel by PCR (~20 pathogens, ~24 hr TAT)  w precautions)  Once,   R        07/05/23 1728   07/05/23 1342   Blood culture (routine x 2)  BLOOD CULTURE X 2,   R (with STAT occurrences),   Status:  Canceled      07/05/23 1341            Vitals/Pain Today's Vitals   07/05/23 1633 07/05/23 1645 07/05/23 1700 07/05/23 1715  BP:  139/76 136/83 (!) 143/88  Pulse:  (!) 102 99 (!) 103  Resp:  (!) 21 (!) 21 20  Temp: 99.2 F (37.3 C)     TempSrc: Oral     SpO2:  96% 95% 96%  PainSc:        Isolation Precautions Droplet precaution  Medications Medications  aspirin EC tablet 81 mg (has no administration in time range)  amLODipine (NORVASC) tablet 10 mg (has no administration in time range)  furosemide (LASIX) tablet 20 mg (has no administration in time range)  losartan (COZAAR) tablet 50 mg (has no administration in time range)  fluticasone (FLONASE) 50 MCG/ACT nasal spray 1 spray (has no administration in time range)  mometasone-formoterol (DULERA) 200-5 MCG/ACT inhaler 2 puff (has no administration in time range)  montelukast (SINGULAIR) tablet 10 mg (has no administration in time range)  enoxaparin (LOVENOX) injection 40 mg (has no administration in time range)  sodium chloride flush (NS) 0.9 % injection 3 mL (has no administration in time range)  sodium chloride flush (NS) 0.9 % injection 3 mL (has no administration in time range)  0.9 %  sodium chloride infusion (has no administration in time range)  acetaminophen (TYLENOL) tablet 650 mg (has no administration in time range)    Or  acetaminophen (TYLENOL) suppository 650 mg (has no administration in time range)  ondansetron (ZOFRAN) tablet 4 mg (has no administration in time range)    Or  ondansetron (ZOFRAN) injection 4 mg (has no administration in time range)  polyethylene glycol (MIRALAX / GLYCOLAX) packet 17 g (has no administration in time range)  guaiFENesin (MUCINEX) 12 hr tablet 600 mg (has no administration in time range)  benzonatate (TESSALON) capsule 100 mg (has no administration in time range)  doxycycline (VIBRA-TABS)  tablet 100 mg (has no administration in time range)  predniSONE (DELTASONE) tablet 50 mg (has no administration in time range)  ipratropium-albuterol (DUONEB) 0.5-2.5 (3) MG/3ML nebulizer solution 3 mL (has no administration in time range)  methylPREDNISolone sodium succinate (SOLU-MEDROL) 125 mg/2 mL injection 125 mg (125 mg Intravenous Given 07/05/23 1432)  ipratropium-albuterol (DUONEB) 0.5-2.5 (3) MG/3ML nebulizer solution 3 mL (3 mLs Nebulization Given 07/05/23 1415)  magnesium sulfate IVPB 2 g 50 mL (0 g Intravenous Stopped 07/05/23 1800)  levalbuterol (XOPENEX) nebulizer solution 1.25 mg (1.25 mg Nebulization Given 07/05/23 1605)    Mobility walks with device, uses walker at home     Focused Assessments Cardiac Assessment Handoff:  Cardiac Rhythm: Sinus tachycardia   , Neuro Assessment Handoff:  Swallow screen pass? Yes  Cardiac Rhythm: Sinus tachycardia       Neuro Assessment: Within Defined Limits     R Recommendations: See Admitting Provider Note  Report given to:   Additional Notes: Pt A&Ox4. Pt ambulatory with walker at baseline. Pt is continent and using BSC.

## 2023-07-05 NOTE — Telephone Encounter (Addendum)
Left a message for patient to call the office. I see she was on her way to the ER regarding shortness of breath and I do see where she is at the ER.

## 2023-07-06 DIAGNOSIS — J9601 Acute respiratory failure with hypoxia: Secondary | ICD-10-CM | POA: Diagnosis not present

## 2023-07-06 LAB — LIPID PANEL
Cholesterol: 177 mg/dL (ref 0–200)
HDL: 70 mg/dL (ref >40)
LDL Cholesterol: 94 mg/dL (ref 0–99)
Total CHOL/HDL Ratio: 2.5 RATIO
Triglycerides: 67 mg/dL (ref <150)
VLDL: 13 mg/dL (ref 0–40)

## 2023-07-06 LAB — HEPATIC FUNCTION PANEL
ALT: 10 U/L (ref 0–44)
AST: 20 U/L (ref 15–41)
Albumin: 3.4 g/dL — ABNORMAL LOW (ref 3.5–5.0)
Alkaline Phosphatase: 77 U/L (ref 38–126)
Bilirubin, Direct: <0.1 mg/dL (ref 0.0–0.2)
Total Bilirubin: 0.6 mg/dL (ref 0.3–1.2)
Total Protein: 8.1 g/dL (ref 6.5–8.1)

## 2023-07-06 LAB — CBC
HCT: 33.6 % — ABNORMAL LOW (ref 36.0–46.0)
Hemoglobin: 11 g/dL — ABNORMAL LOW (ref 12.0–15.0)
MCH: 31.2 pg (ref 26.0–34.0)
MCHC: 32.7 g/dL (ref 30.0–36.0)
MCV: 95.2 fL (ref 80.0–100.0)
Platelets: 214 10*3/uL (ref 150–400)
RBC: 3.53 MIL/uL — ABNORMAL LOW (ref 3.87–5.11)
RDW: 14.2 % (ref 11.5–15.5)
WBC: 8.5 10*3/uL (ref 4.0–10.5)
nRBC: 0 % (ref 0.0–0.2)

## 2023-07-06 LAB — CULTURE, RESPIRATORY W GRAM STAIN

## 2023-07-06 LAB — BASIC METABOLIC PANEL
Anion gap: 11 (ref 5–15)
BUN: 15 mg/dL (ref 8–23)
CO2: 21 mmol/L — ABNORMAL LOW (ref 22–32)
Calcium: 9.2 mg/dL (ref 8.9–10.3)
Chloride: 102 mmol/L (ref 98–111)
Creatinine, Ser: 1.07 mg/dL — ABNORMAL HIGH (ref 0.44–1.00)
GFR, Estimated: 51 mL/min — ABNORMAL LOW (ref >60)
Glucose, Bld: 240 mg/dL — ABNORMAL HIGH (ref 70–99)
Potassium: 3.4 mmol/L — ABNORMAL LOW (ref 3.5–5.1)
Sodium: 134 mmol/L — ABNORMAL LOW (ref 135–145)

## 2023-07-06 MED ORDER — PREDNISONE 50 MG PO TABS: 50.0000 mg | ORAL_TABLET | Freq: Every day | ORAL | 0 refills | Status: AC

## 2023-07-06 MED ORDER — POTASSIUM CHLORIDE CRYS ER 20 MEQ PO TBCR
40.0000 meq | EXTENDED_RELEASE_TABLET | Freq: Once | ORAL | Status: AC
  Administered 2023-07-06: 40 meq via ORAL
  Filled 2023-07-06: qty 2

## 2023-07-06 MED ORDER — IPRATROPIUM-ALBUTEROL 0.5-2.5 (3) MG/3ML IN SOLN: 3.0000 mL | RESPIRATORY_TRACT | 5 refills | Status: DC | PRN

## 2023-07-06 MED ORDER — DOXYCYCLINE HYCLATE 100 MG PO TABS: 100.0000 mg | ORAL_TABLET | Freq: Two times a day (BID) | ORAL | 0 refills | Status: AC

## 2023-07-06 MED ORDER — GUAIFENESIN ER 600 MG PO TB12: 600.0000 mg | ORAL_TABLET | Freq: Two times a day (BID) | ORAL | 0 refills | Status: DC | PRN

## 2023-07-06 NOTE — Care Management Obs Status (Signed)
MEDICARE OBSERVATION STATUS NOTIFICATION   Patient Details  Name: Mary Chase MRN: [id number] Date of Birth: 09/19/1935   Medicare Observation Status Notification Given:  Yes    Tom-Johnson, Hershal Coria, RN 07/06/2023, 9:55 AM

## 2023-07-06 NOTE — Progress Notes (Signed)
OT Cancellation Note  Patient Details Name: Mary Chase MRN: [id number] DOB: [birth year]   Cancelled Treatment:    Reason Eval/Treat Not Completed: OT screened, no needs identified, will sign off Spoke with pt prior to discharge. Pt reports no change in functional mobility/performance. She is already established with home health therapy services (OT and PT) and will resume with them once discharged.   Mary Chase, OTR/L,CBIS  Supplemental OT - [facility] and [facility] Secure [facility] Preferred   07/06/2023, 11:26 AM

## 2023-07-06 NOTE — Progress Notes (Signed)
Explained discharge instructions to patient. Reviewed follow up appointment and next medication administration times. Also reviewed education. Patient verbalized having an understanding for instructions given. All belongings are in the patient's possession. IV was removed. No other needs verbalized. Will transport downstairs for discharge.

## 2023-07-06 NOTE — Discharge Summary (Signed)
Physician Discharge Summary  Lesley Atkin Counihan ZOX:[id number] DOB: November 01, 1935 DOA: 07/05/2023  PCP: Lorenda Ishihara, MD  Admit date: 07/05/2023 Discharge date: 07/06/2023  Admitted From: Home Disposition:  Home   Recommendations for Outpatient Follow-up:  Follow up with PCP  Follow up with Pulm, Dr. Isaiah Serge as scheduled 7/17  Discharge Condition: Stable CODE STATUS: Full  Diet recommendation: Heart healthy, low sodium diet   Brief/Interim Summary: Mary Chase is a 88 y.o. female with medical history significant of asthma/COPD, CAD, GERD, HTN who presents with SOB. She was hospitalized in May 2024 with symptoms of shortness of breath and wheezing.  At that time, patient was admitted for asthma/COPD exacerbation with respiratory failure, exacerbated by parainfluenza virus.  She did require BiPAP during that hospitalization.    Patient states that since her discharge from the hospital in May, she has never felt like she returned back to her baseline.  Yesterday, she started to feel poorly, dyspnea at rest.  Also with cough.  Denies any fevers, nausea or vomiting.  She admits to some left-sided chest pain that is intermittent in nature and feels like "lightning" spreading anteriorly.  These episodes are short-lived and does not correlate with cough.   Patient continues to do well.  She did not require nasal cannula O2.  Her workup including influenza and RSV and COVID were negative.  Rspiratory viral panel negative.  She was given breathing treatments, IV Solu-Medrol, empiric doxycycline.  She had clinical improvement and was discharged home.  She has follow-up with pulmonology as outpatient.  Discharge Diagnoses:   Principal Problem:   Acute respiratory failure with hypoxia (HCC) Active Problems:   HTN (hypertension)   COPD exacerbation (HCC)   CAD (coronary artery disease), native coronary artery   Asthma exacerbation   CKD stage 3a, GFR 45-59 ml/min [facility])    Discharge  Instructions  Discharge Instructions     (HEART FAILURE PATIENTS) Call MD:  Anytime you have any of the following symptoms: 1) 3 pound weight gain in 24 hours or 5 pounds in 1 week 2) shortness of breath, with or without a dry hacking cough 3) swelling in the hands, feet or stomach 4) if you have to sleep on extra pillows at night in order to breathe.   Complete by: As directed    Call MD for:  difficulty breathing, headache or visual disturbances   Complete by: As directed    Call MD for:  extreme fatigue   Complete by: As directed    Call MD for:  persistant dizziness or light-headedness   Complete by: As directed    Call MD for:  persistant nausea and vomiting   Complete by: As directed    Call MD for:  severe uncontrolled pain   Complete by: As directed    Call MD for:  temperature >100.4   Complete by: As directed    Diet - low sodium heart healthy   Complete by: As directed    Discharge instructions   Complete by: As directed    You were cared for by a hospitalist during your hospital stay. If you have any questions about your discharge medications or the care you received while you were in the hospital after you are discharged, you can call the unit and ask to speak with the hospitalist on call if the hospitalist that took care of you is not available. Once you are discharged, your primary care physician will handle any further medical issues. Please note that NO  REFILLS for any discharge medications will be authorized once you are discharged, as it is imperative that you return to your primary care physician (or establish a relationship with a primary care physician if you do not have one) for your aftercare needs so that they can reassess your need for medications and monitor your lab values.   Increase activity slowly   Complete by: As directed       Allergies as of 07/06/2023       Reactions   Penicillins Shortness Of Breath, Rash   Alendronate Sodium    Other Reaction(s):  hair loss, muscle aches, SOB,weight issues   Cephalexin Itching   Other Reaction(s): itch   Penicillin G    Other Reaction(s): cannot breathe   Sulfa Antibiotics Itching, Other (See Comments)   Swelling in mouth   Sulfacetamide Sodium    Other Reaction(s): itch   Other Itching   Some beans         Medication List     TAKE these medications    acetaminophen 500 MG tablet Commonly known as: TYLENOL Take 500 mg by mouth every 6 (six) hours as needed. What changed: Another medication with the same name was removed. Continue taking this medication, and follow the directions you see here.   albuterol 108 (90 Base) MCG/ACT inhaler Commonly known as: VENTOLIN HFA INHALE 2 PUFFS INTO THE LUNGS EVERY 6 HOURS AS NEEDED FOR WHEEZE What changed: See the new instructions.   amLODipine 10 MG tablet Commonly known as: NORVASC Take 10 mg by mouth daily.   aspirin EC 81 MG tablet Take 1 tablet (81 mg total) by mouth 2 (two) times daily.   doxycycline 100 MG tablet Commonly known as: VIBRA-TABS Take 1 tablet (100 mg total) by mouth every 12 (twelve) hours for 5 days.   fluticasone 50 MCG/ACT nasal spray Commonly known as: FLONASE Place 1 spray into both nostrils daily as needed for allergies or rhinitis. What changed: when to take this   fluticasone-salmeterol 250-50 MCG/ACT Aepb Commonly known as: Wixela Inhub Inhale 1 puff into the lungs in the morning and at bedtime.   furosemide 20 MG tablet Commonly known as: LASIX Take 1 tablet (20 mg total) by mouth every other day.   guaiFENesin 600 MG 12 hr tablet Commonly known as: MUCINEX Take 1 tablet (600 mg total) by mouth 2 (two) times daily as needed for cough or to loosen phlegm.   ipratropium-albuterol 0.5-2.5 (3) MG/3ML Soln Commonly known as: DUONEB Take 3 mLs by nebulization every 4 (four) hours as needed.   losartan 50 MG tablet Commonly known as: COZAAR Take 1 tablet (50 mg total) by mouth daily.   montelukast 10  MG tablet Commonly known as: SINGULAIR Take 1 tablet (10 mg total) by mouth daily.   predniSONE 50 MG tablet Commonly known as: DELTASONE Take 1 tablet (50 mg total) by mouth daily with breakfast for 5 days.   rosuvastatin 5 MG tablet Commonly known as: CRESTOR Take 5 mg by mouth See admin instructions. Take 5 mg by mouth 2 times a week (Wednesday and Thursday)   Vitamin B-12 5000 MCG Tbdp Take 5,000 mcg by mouth daily.   Vitamin D3 25 MCG (1000 UT) Caps Take 1,000 Units by mouth daily in the afternoon.        Follow-up Information     Lorenda Ishihara, MD Follow up.   Specialty: Internal Medicine Contact information: [address] [place] [address] [phone]  Chilton Greathouse, MD. Nyra Capes on 07/07/2023.   Specialty: Pulmonary Disease Contact information: [address] [place] [address] [phone]         [facility] Follow up.   Specialty: Home Health Services Why: Someone will call you for resumption of care visit. Contact information: [address] [place] [address] [phone]                Allergies  Allergen Reactions   Penicillins Shortness Of Breath and Rash   Alendronate Sodium     Other Reaction(s): hair loss, muscle aches, SOB,weight issues   Cephalexin Itching    Other Reaction(s): itch   Penicillin G     Other Reaction(s): cannot breathe   Sulfa Antibiotics Itching and Other (See Comments)    Swelling in mouth   Sulfacetamide Sodium     Other Reaction(s): itch   Other Itching    Some beans      Procedures/Studies: DG Chest 2 View  Result Date: 07/05/2023 CLINICAL DATA:  Shortness of breath.  Chest pain EXAM: CHEST - 2 VIEW COMPARISON:  May 04, 2023. FINDINGS: Stable cardiomediastinal silhouette. No acute pulmonary disease is noted. Bony thorax is unremarkable. IMPRESSION: No active cardiopulmonary disease. Electronically Signed   By: Lupita Raider M.D.    On: 07/05/2023 14:13   CT CORONARY MORPH W/CTA COR W/SCORE W/CA W/CM &/OR WO/CM  Addendum Date: 06/29/2023   ADDENDUM REPORT: 06/29/2023 22:31 EXAM: OVER-READ INTERPRETATION  CT CHEST The following report is an over-read performed by radiologist Dr. Arnoldo Morale [facility] on 06/29/2023. This over-read does not include interpretation of cardiac or coronary anatomy or pathology. The coronary CTA interpretation by the cardiologist is attached. COMPARISON:  Chest radiograph dated 05/04/2023. FINDINGS: No adenopathy noted. Bibasilar linear atelectasis/scarring. The visualized lungs are otherwise clear. No acute osseous pathology.  Degenerative changes of the spine. IMPRESSION: No acute extracardiac findings. Electronically Signed   By: Elgie Collard M.D.   On: 06/29/2023 22:31   Result Date: 06/29/2023 CLINICAL DATA:  Chest pain EXAM: Cardiac CTA MEDICATIONS: Sub lingual nitro. 4mg  x 2 TECHNIQUE: The patient was scanned on a Siemens 192 slice scanner. Gantry rotation speed was 250 msecs. Collimation was 0.6 mm. A 100 kV prospective scan was triggered in the ascending thoracic aorta at 35-75% of the R-R interval. Average HR during the scan was 60 bpm. The 3D data set was interpreted on a dedicated work station using MPR, MIP and VRT modes. A total of 80cc of contrast was used. FINDINGS: Non-cardiac: See separate report from [facility]. Pulmonary veins drain normally to the left atrium. Calcium Score: 452 Agatston units. Coronary Arteries: Right dominant with no anomalies LM: No plaque or stenosis. LAD system: Mixed plaque, mild stenosis in the proximal to mid LAD (1-24%). Circumflex system: Small LCX, no plaque or stenosis. RCA system: Mixed plaque in the proximal RCA, mild (25-49%) stenosis. IMPRESSION: 1. Coronary artery calcium score 452 Agatston units. This places the patient in the 73rd percentile for age and gender, suggesting intermediate risk for future cardiac events. 2.  Mild  nonobstructive CAD. [employer] account executive Electronically Signed: By: Marca Ancona M.D. On: 06/29/2023 21:22      Discharge Exam: Vitals:   07/06/23 0828 07/06/23 0831  BP:    Pulse:    Resp:    Temp:    SpO2: 97% 98%    General: Pt is alert, awake, not in acute distress Cardiovascular: RRR, S1/S2 +,  no edema Respiratory: CTA bilaterally, no wheezing, no rhonchi, no respiratory distress, no conversational dyspnea, on room air Abdominal: Soft, NT, ND, bowel sounds + Extremities: no edema, no cyanosis Psych: Normal mood and affect, stable judgement and insight     The results of significant diagnostics from this hospitalization (including imaging, microbiology, ancillary and laboratory) are listed below for reference.     Microbiology: Recent Results (from the past 240 hour(s))  Blood culture (routine x 2)     Status: None (Preliminary result)   Collection Time: 07/05/23  2:05 PM   Specimen: BLOOD  Result Value Ref Range Status   Specimen Description BLOOD SITE NOT SPECIFIED  Final   Special Requests   Final    BOTTLES DRAWN AEROBIC AND ANAEROBIC Blood Culture adequate volume   Culture   Final    NO GROWTH < 24 HOURS Performed at [facility], [address] [place], [place] [address]    Report Status PENDING  Incomplete  Blood culture (routine x 2)     Status: None (Preliminary result)   Collection Time: 07/05/23  2:22 PM   Specimen: BLOOD  Result Value Ref Range Status   Specimen Description BLOOD SITE NOT SPECIFIED  Final   Special Requests   Final    BOTTLES DRAWN AEROBIC AND ANAEROBIC Blood Culture adequate volume   Culture   Final    NO GROWTH < 24 HOURS Performed at [facility], [address] [place], [place] [address]    Report Status PENDING  Incomplete  Resp panel by RT-PCR (RSV, Flu A&B, Covid) Anterior Nasal Swab     Status: None   Collection Time: 07/05/23  3:14 PM   Specimen: Anterior Nasal Swab  Result Value Ref Range Status   SARS  Coronavirus 2 by RT PCR NEGATIVE NEGATIVE Final   Influenza A by PCR NEGATIVE NEGATIVE Final   Influenza B by PCR NEGATIVE NEGATIVE Final    Comment: (NOTE) The Xpert Xpress SARS-CoV-2/FLU/RSV plus assay is intended as an aid in the diagnosis of influenza from Nasopharyngeal swab specimens and should not be used as a sole basis for treatment. Nasal washings and aspirates are unacceptable for Xpert Xpress SARS-CoV-2/FLU/RSV testing.  Fact Sheet for Patients: BloggerCourse.com  Fact Sheet for Healthcare Providers: SeriousBroker.it  This test is not yet approved or cleared by the [place] FDA and has been authorized for detection and/or diagnosis of SARS-CoV-2 by FDA under an Emergency Use Authorization (EUA). This EUA will remain in effect (meaning this test can be used) for the duration of the COVID-19 declaration under Section 564(b)(1) of the Act, 21 U.S.C. section 360bbb-3(b)(1), unless the authorization is terminated or revoked.     Resp Syncytial Virus by PCR NEGATIVE NEGATIVE Final    Comment: (NOTE) Fact Sheet for Patients: BloggerCourse.com  Fact Sheet for Healthcare Providers: SeriousBroker.it  This test is not yet approved or cleared by the [place] FDA and has been authorized for detection and/or diagnosis of SARS-CoV-2 by FDA under an Emergency Use Authorization (EUA). This EUA will remain in effect (meaning this test can be used) for the duration of the COVID-19 declaration under Section 564(b)(1) of the Act, 21 U.S.C. section 360bbb-3(b)(1), unless the authorization is terminated or revoked.  Performed at [facility], [address] [place], [place] [address]   Respiratory (~20 pathogens) panel by PCR     Status: None   Collection Time: 07/05/23  3:14 PM   Specimen: Nasopharyngeal Swab;  Respiratory  Result Value Ref Range Status   Adenovirus  NOT DETECTED NOT DETECTED Final   Coronavirus 229E NOT DETECTED NOT DETECTED Final    Comment: (NOTE) The Coronavirus on the Respiratory Panel, DOES NOT test for the novel  Coronavirus (2019 nCoV)    Coronavirus HKU1 NOT DETECTED NOT DETECTED Final   Coronavirus NL63 NOT DETECTED NOT DETECTED Final   Coronavirus OC43 NOT DETECTED NOT DETECTED Final   Metapneumovirus NOT DETECTED NOT DETECTED Final   Rhinovirus / Enterovirus NOT DETECTED NOT DETECTED Final   Influenza A NOT DETECTED NOT DETECTED Final   Influenza B NOT DETECTED NOT DETECTED Final   Parainfluenza Virus 1 NOT DETECTED NOT DETECTED Final   Parainfluenza Virus 2 NOT DETECTED NOT DETECTED Final   Parainfluenza Virus 3 NOT DETECTED NOT DETECTED Final   Parainfluenza Virus 4 NOT DETECTED NOT DETECTED Final   Respiratory Syncytial Virus NOT DETECTED NOT DETECTED Final   Bordetella pertussis NOT DETECTED NOT DETECTED Final   Bordetella Parapertussis NOT DETECTED NOT DETECTED Final   Chlamydophila pneumoniae NOT DETECTED NOT DETECTED Final   Mycoplasma pneumoniae NOT DETECTED NOT DETECTED Final    Comment: Performed at [facility], [address] [place], [place] [address]  Expectorated Sputum Assessment w Gram Stain, Rflx to Resp Cult     Status: None   Collection Time: 07/05/23 10:21 PM   Specimen: Expectorated Sputum  Result Value Ref Range Status   Specimen Description EXPECTORATED SPUTUM  Final   Special Requests NONE  Final   Sputum evaluation   Final    THIS SPECIMEN IS ACCEPTABLE FOR SPUTUM CULTURE Performed at [facility], [address] [place], [place] [address]    Report Status 07/05/2023 FINAL  Final  Culture, Respiratory w Gram Stain     Status: None (Preliminary result)   Collection Time: 07/05/23 10:21 PM  Result Value Ref Range Status   Specimen Description EXPECTORATED SPUTUM  Final   Special Requests NONE Reflexed from [id number]  Final   Gram Stain   Final    FEW WBC PRESENT, PREDOMINANTLY  PMN FEW GRAM POSITIVE COCCI    Culture   Final    TOO YOUNG TO READ Performed at [facility], [address], [place], [place] [address]    Report Status PENDING  Incomplete     Labs: BNP (last 3 results) Recent Labs    05/07/23 0631 05/08/23 0346 07/05/23 1219  BNP 36.3 37.8 17.4   Basic Metabolic Panel: Recent Labs  Lab 07/05/23 1219 07/06/23 0039  NA 140 134*  K 3.6 3.4*  CL 106 102  CO2 23 21*  GLUCOSE 120* 240*  BUN 11 15  CREATININE 1.11* 1.07*  CALCIUM 9.3 9.2   Liver Function Tests: Recent Labs  Lab 07/06/23 0039  AST 20  ALT 10  ALKPHOS 77  BILITOT 0.6  PROT 8.1  ALBUMIN 3.4*   No results for input(s): "LIPASE", "AMYLASE" in the last 168 hours. No results for input(s): "AMMONIA" in the last 168 hours. CBC: Recent Labs  Lab 07/05/23 1219 07/06/23 0039  WBC 11.3* 8.5  HGB 11.6* 11.0*  HCT 36.1 33.6*  MCV 95.5 95.2  PLT 228 214   Cardiac Enzymes: No results for input(s): "CKTOTAL", "CKMB", "CKMBINDEX", "TROPONINI" in the last 168 hours. BNP: Invalid input(s): "POCBNP" CBG: No results for input(s): "GLUCAP" in the last 168 hours. D-Dimer No results for input(s): "DDIMER" in the last 72 hours. Hgb A1c No results  for input(s): "HGBA1C" in the last 72 hours. Lipid Profile Recent Labs    07/06/23 0039  CHOL 177  HDL 70  LDLCALC 94  TRIG 67  CHOLHDL 2.5   Thyroid function studies No results for input(s): "TSH", "T4TOTAL", "T3FREE", "THYROIDAB" in the last 72 hours.  Invalid input(s): "FREET3" Anemia work up No results for input(s): "VITAMINB12", "FOLATE", "FERRITIN", "TIBC", "IRON", "RETICCTPCT" in the last 72 hours. Urinalysis    Component Value Date/Time   COLORURINE STRAW (A) 09/10/2020 1426   APPEARANCEUR CLEAR 09/10/2020 1426   LABSPEC 1.005 09/10/2020 1426   PHURINE 8.0 09/10/2020 1426   GLUCOSEU NEGATIVE 09/10/2020 1426   HGBUR NEGATIVE 09/10/2020 1426   BILIRUBINUR NEGATIVE 09/10/2020 1426   KETONESUR NEGATIVE  09/10/2020 1426   PROTEINUR 30 (A) 09/10/2020 1426   NITRITE NEGATIVE 09/10/2020 1426   LEUKOCYTESUR NEGATIVE 09/10/2020 1426   Sepsis Labs Recent Labs  Lab 07/05/23 1219 07/06/23 0039  WBC 11.3* 8.5   Microbiology Recent Results (from the past 240 hour(s))  Blood culture (routine x 2)     Status: None (Preliminary result)   Collection Time: 07/05/23  2:05 PM   Specimen: BLOOD  Result Value Ref Range Status   Specimen Description BLOOD SITE NOT SPECIFIED  Final   Special Requests   Final    BOTTLES DRAWN AEROBIC AND ANAEROBIC Blood Culture adequate volume   Culture   Final    NO GROWTH < 24 HOURS Performed at [facility] [place] [facility], [address] [place], [place] [address]    Report Status PENDING  Incomplete  Blood culture (routine x 2)     Status: None (Preliminary result)   Collection Time: 07/05/23  2:22 PM   Specimen: BLOOD  Result Value Ref Range Status   Specimen Description BLOOD SITE NOT SPECIFIED  Final   Special Requests   Final    BOTTLES DRAWN AEROBIC AND ANAEROBIC Blood Culture adequate volume   Culture   Final    NO GROWTH < 24 HOURS Performed at [facility], [address] [place], [place] [address]    Report Status PENDING  Incomplete  Resp panel by RT-PCR (RSV, Flu A&B, Covid) Anterior Nasal Swab     Status: None   Collection Time: 07/05/23  3:14 PM   Specimen: Anterior Nasal Swab  Result Value Ref Range Status   SARS Coronavirus 2 by RT PCR NEGATIVE NEGATIVE Final   Influenza A by PCR NEGATIVE NEGATIVE Final   Influenza B by PCR NEGATIVE NEGATIVE Final    Comment: (NOTE) The Xpert Xpress SARS-CoV-2/FLU/RSV plus assay is intended as an aid in the diagnosis of influenza from Nasopharyngeal swab specimens and should not be used as a sole basis for treatment. Nasal washings and aspirates are unacceptable for Xpert Xpress SARS-CoV-2/FLU/RSV testing.  Fact Sheet for Patients: BloggerCourse.com  Fact Sheet for Healthcare  Providers: SeriousBroker.it  This test is not yet approved or cleared by the [place] FDA and has been authorized for detection and/or diagnosis of SARS-CoV-2 by FDA under an Emergency Use Authorization (EUA). This EUA will remain in effect (meaning this test can be used) for the duration of the COVID-19 declaration under Section 564(b)(1) of the Act, 21 U.S.C. section 360bbb-3(b)(1), unless the authorization is terminated or revoked.     Resp Syncytial Virus by PCR NEGATIVE NEGATIVE Final    Comment: (NOTE) Fact Sheet for Patients: BloggerCourse.com  Fact Sheet for Healthcare Providers: SeriousBroker.it  This test is not yet approved or cleared  by the [place] and has been authorized for detection and/or diagnosis of SARS-CoV-2 by FDA under an Emergency Use Authorization (EUA). This EUA will remain in effect (meaning this test can be used) for the duration of the COVID-19 declaration under Section 564(b)(1) of the Act, 21 U.S.C. section 360bbb-3(b)(1), unless the authorization is terminated or revoked.  Performed at [facility], [address] [place], [place] [address]   Respiratory (~20 pathogens) panel by PCR     Status: None   Collection Time: 07/05/23  3:14 PM   Specimen: Nasopharyngeal Swab; Respiratory  Result Value Ref Range Status   Adenovirus NOT DETECTED NOT DETECTED Final   Coronavirus 229E NOT DETECTED NOT DETECTED Final    Comment: (NOTE) The Coronavirus on the Respiratory Panel, DOES NOT test for the novel  Coronavirus (2019 nCoV)    Coronavirus HKU1 NOT DETECTED NOT DETECTED Final   Coronavirus NL63 NOT DETECTED NOT DETECTED Final   Coronavirus OC43 NOT DETECTED NOT DETECTED Final   Metapneumovirus NOT DETECTED NOT DETECTED Final   Rhinovirus / Enterovirus NOT DETECTED NOT DETECTED Final   Influenza A NOT DETECTED NOT DETECTED Final   Influenza B NOT DETECTED  NOT DETECTED Final   Parainfluenza Virus 1 NOT DETECTED NOT DETECTED Final   Parainfluenza Virus 2 NOT DETECTED NOT DETECTED Final   Parainfluenza Virus 3 NOT DETECTED NOT DETECTED Final   Parainfluenza Virus 4 NOT DETECTED NOT DETECTED Final   Respiratory Syncytial Virus NOT DETECTED NOT DETECTED Final   Bordetella pertussis NOT DETECTED NOT DETECTED Final   Bordetella Parapertussis NOT DETECTED NOT DETECTED Final   Chlamydophila pneumoniae NOT DETECTED NOT DETECTED Final   Mycoplasma pneumoniae NOT DETECTED NOT DETECTED Final    Comment: Performed at [facility], [address] [place], [place] [address]  Expectorated Sputum Assessment w Gram Stain, Rflx to Resp Cult     Status: None   Collection Time: 07/05/23 10:21 PM   Specimen: Expectorated Sputum  Result Value Ref Range Status   Specimen Description EXPECTORATED SPUTUM  Final   Special Requests NONE  Final   Sputum evaluation   Final    THIS SPECIMEN IS ACCEPTABLE FOR SPUTUM CULTURE Performed at [facility], [address], [place], [place] [address]    Report Status 07/05/2023 FINAL  Final  Culture, Respiratory w Gram Stain     Status: None (Preliminary result)   Collection Time: 07/05/23 10:21 PM  Result Value Ref Range Status   Specimen Description EXPECTORATED SPUTUM  Final   Special Requests NONE Reflexed from [id number]  Final   Gram Stain   Final    FEW WBC PRESENT, PREDOMINANTLY PMN FEW GRAM POSITIVE COCCI    Culture   Final    TOO YOUNG TO READ Performed at [facility], [address] [place], [place] [address]    Report Status PENDING  Incomplete     Patient was seen and examined on the day of discharge and was found to be in stable condition. Time coordinating discharge: 35 minutes including assessment and coordination of care, as well as examination of the patient.   SIGNED:  Noralee Stain, DO [facility] 07/06/2023, 1:27 PM

## 2023-07-06 NOTE — TOC Transition Note (Signed)
Transition of Care [facility]) - CM/SW Discharge Note   Patient Details  Name: Mary Chase MRN: [id number] Date of Birth: [birth year]  [facility] CM/SW Contact:  Tom-Johnson, Hershal Coria, RN Phone Number: 07/06/2023, 10:24 AM   Clinical Narrative:     Patient is scheduled for discharge today.  Readmission Risk Assessment done. Home health resumption of care info, Outpatient f/u, hospital f/u and discharge instructions on AVS. Daughter, Claris Gower to transport at discharge.  No further TOC needs noted.     Final next level of care: Home w Home Health Services Barriers to Discharge: Barriers Resolved   Patient Goals and CMS Choice CMS Medicare.gov Compare Post Acute Care list provided to:: Patient Choice offered to / list presented to : Patient, Adult Children (Daughter, Claris Gower)  Discharge Placement                  Patient to be transferred to facility by: Daughter Name of family member notified: [facility]    Discharge Plan and Services Additional resources added to the After Visit Summary for                  DME Arranged: N/A DME Agency: NA       HH Arranged: PT, RN (Resumption Of Care) HH Agency: [facility] Date [facility] Agency Contacted: 07/06/23 Time HH Agency Contacted: 0845 Representative spoke with at [facility] Agency: Merry Proud  Social Determinants of Health (SDOH) Interventions SDOH Screenings   Food Insecurity: No Food Insecurity (07/05/2023)  Housing: Low Risk  (07/05/2023)  Transportation Needs: No Transportation Needs (07/05/2023)  Utilities: Not At Risk (07/05/2023)  Tobacco Use: Low Risk  (07/05/2023)     Readmission Risk Interventions    07/06/2023   10:20 AM  Readmission Risk Prevention Plan  Transportation Screening Complete  PCP or Specialist Appt within 5-7 Days Complete  Home Care Screening Complete  Medication Review (RN CM) Referral to Pharmacy

## 2023-07-06 NOTE — Plan of Care (Signed)
?  Problem: Education: ?Goal: Knowledge of disease or condition will improve ?Outcome: Progressing ?Goal: Knowledge of the prescribed therapeutic regimen will improve ?Outcome: Progressing ?Goal: Individualized Educational Video(s) ?Outcome: Progressing ?  ?Problem: Activity: ?Goal: Ability to tolerate increased activity will improve ?Outcome: Progressing ?Goal: Will verbalize the importance of balancing activity with adequate rest periods ?Outcome: Progressing ?  ?Problem: Respiratory: ?Goal: Ability to maintain a clear airway will improve ?Outcome: Progressing ?Goal: Levels of oxygenation will improve ?Outcome: Progressing ?Goal: Ability to maintain adequate ventilation will improve ?Outcome: Progressing ?  ?Problem: Education: ?Goal: Knowledge of disease or condition will improve ?Outcome: Progressing ?Goal: Knowledge of the prescribed therapeutic regimen will improve ?Outcome: Progressing ?Goal: Individualized Educational Video(s) ?Outcome: Progressing ?  ?Problem: Activity: ?Goal: Ability to tolerate increased activity will improve ?Outcome: Progressing ?Goal: Will verbalize the importance of balancing activity with adequate rest periods ?Outcome: Progressing ?  ?Problem: Respiratory: ?Goal: Ability to maintain a clear airway will improve ?Outcome: Progressing ?Goal: Levels of oxygenation will improve ?Outcome: Progressing ?Goal: Ability to maintain adequate ventilation will improve ?Outcome: Progressing ?  ?

## 2023-07-06 NOTE — Progress Notes (Signed)
PT Cancellation Note and Discharge  Patient Details Name: Mary Chase MRN: [id number] DOB: [birth year]   Cancelled Treatment:    Reason Eval/Treat Not Completed: PT screened, no needs identified, will sign off. Discussed pt case with OT who reports pt is currently mobilizing at a modified independent level and does not require a formal PT evaluation at this time. It appears pt is already set up with home health therapies. PT signing off. If needs change, please reconsult.     Marylynn Pearson 07/06/2023, 11:27 AM  Conni Slipper, PT, DPT Acute Rehabilitation Services Secure Chat Preferred Office: [phone]

## 2023-07-06 NOTE — Care Management CC44 (Signed)
Condition Code 44 Documentation Completed  Patient Details  Name: IA LEEB MRN: [id number] Date of Birth: 1935/09/24   Condition Code 44 given:  Yes Patient signature on Condition Code 44 notice:  Yes Documentation of 2 MD's agreement:  Yes Code 44 added to claim:  Yes    Tom-Johnson, Hershal Coria, RN 07/06/2023, 9:55 AM

## 2023-07-06 NOTE — Plan of Care (Signed)
  Problem: Education: Goal: Knowledge of disease or condition will improve Outcome: Adequate for Discharge Goal: Knowledge of the prescribed therapeutic regimen will improve Outcome: Adequate for Discharge Goal: Individualized Educational Video(s) Outcome: Adequate for Discharge   Problem: Activity: Goal: Ability to tolerate increased activity will improve Outcome: Adequate for Discharge Goal: Will verbalize the importance of balancing activity with adequate rest periods Outcome: Adequate for Discharge   Problem: Respiratory: Goal: Ability to maintain a clear airway will improve Outcome: Adequate for Discharge Goal: Levels of oxygenation will improve Outcome: Adequate for Discharge Goal: Ability to maintain adequate ventilation will improve Outcome: Adequate for Discharge   

## 2023-07-07 ENCOUNTER — Encounter: Payer: Self-pay | Admitting: Pulmonary Disease

## 2023-07-07 ENCOUNTER — Ambulatory Visit: Payer: Medicare Other | Admitting: Pulmonary Disease

## 2023-07-07 VITALS — BP 126/66 | HR 108 | Temp 98.5°F | Ht 60.0 in | Wt 169.8 lb

## 2023-07-07 DIAGNOSIS — J45901 Unspecified asthma with (acute) exacerbation: Secondary | ICD-10-CM

## 2023-07-07 LAB — CBC WITH DIFFERENTIAL/PLATELET
Basophils Absolute: 0 10*3/uL (ref 0.0–0.1)
Basophils Relative: 0.2 % (ref 0.0–3.0)
Eosinophils Absolute: 0 10*3/uL (ref 0.0–0.7)
Eosinophils Relative: 0 % (ref 0.0–5.0)
HCT: 34.1 % — ABNORMAL LOW (ref 36.0–46.0)
Hemoglobin: 11.2 g/dL — ABNORMAL LOW (ref 12.0–15.0)
Lymphocytes Relative: 5 % — ABNORMAL LOW (ref 12.0–46.0)
Lymphs Abs: 0.8 10*3/uL (ref 0.7–4.0)
MCHC: 32.8 g/dL (ref 30.0–36.0)
MCV: 93.9 fl (ref 78.0–100.0)
Monocytes Absolute: 0.5 10*3/uL (ref 0.1–1.0)
Monocytes Relative: 2.9 % — ABNORMAL LOW (ref 3.0–12.0)
Neutro Abs: 15.4 10*3/uL — ABNORMAL HIGH (ref 1.4–7.7)
Neutrophils Relative %: 91.9 % — ABNORMAL HIGH (ref 43.0–77.0)
Platelets: 277 10*3/uL (ref 150.0–400.0)
RBC: 3.63 Mil/uL — ABNORMAL LOW (ref 3.87–5.11)
RDW: 15.9 % — ABNORMAL HIGH (ref 11.5–15.5)
WBC: 16.8 10*3/uL — ABNORMAL HIGH (ref 4.0–10.5)

## 2023-07-07 LAB — CULTURE, RESPIRATORY W GRAM STAIN

## 2023-07-07 LAB — NITRIC OXIDE: Nitric Oxide: 54

## 2023-07-07 NOTE — Progress Notes (Signed)
Mary Chase    [id number]    1935-09-02  Primary Care Physician:Varadarajan, Soyla Murphy, MD  Referring Physician: Lorenda Ishihara, MD [address] [place],  [place] [address]  Chief complaint: Consult for asthma  HPI: 88 y.o. who  has a past medical history of Age related osteoporosis, Allergic rhinitis, Aortic stenosis, Arthritis, Ascending aorta dilatation (HCC), Asthma, Atherosclerosis of native artery of both lower extremities with intermittent claudication (HCC), CAD (coronary artery disease), native coronary artery, Cataract, Chronic kidney disease, stage 3a (HCC), COPD (chronic obstructive pulmonary disease) (HCC), Degenerative joint disease (DJD) of hip, Diabetic renal disease (HCC), Dyspnea, GERD (gastroesophageal reflux disease), Headache, Hypercholesterolemia, Hypertension, Hypertensive retinopathy, Mitral regurgitation, Osteoarthritis of hip, Pneumonia, Pre-diabetes, Pulmonary hypertension (HCC), PVD (peripheral vascular disease) (HCC), Sickle cell anemia (HCC), Sickle cell trait (HCC), and Tricuspid regurgitation.   She has history of allergic asthma and follows with Dr. Dellis Anes with positive RAST panel and significantly elevated IgE.  Maintained on Wixela and Singulair.  Hospitalized in May 2024 with asthma exacerbation secondary to parainfluenza virus.  At that time lisinopril was held and she was started on losartan to help with the cough.  She did not feel well postdischarge and never recovered back to baseline and hospitalized overnight on 7/15 for dyspnea.  Her virus panel at this time were negative.  She was treated with IV steroids, empiric doxycycline with improvement in symptoms  She is also being evaluated by cardiology for severe MR and will need a TEE in the future to evaluate.  Pets: No pets Occupation: Retired [employer] operational officer Exposures: No mold, hot tub, Jacuzzi.  No feather pillows or comforters Smoking history: Never  smoker Travel history: No significant travel history Relevant family history: No family history of lung disease  Outpatient Encounter Medications as of 07/07/2023  Medication Sig   acetaminophen (TYLENOL) 500 MG tablet Take 500 mg by mouth every 6 (six) hours as needed.   albuterol (VENTOLIN HFA) 108 (90 Base) MCG/ACT inhaler INHALE 2 PUFFS INTO THE LUNGS EVERY 6 HOURS AS NEEDED FOR WHEEZE (Patient taking differently: Inhale 1 puff into the lungs every 6 (six) hours as needed for wheezing or shortness of breath.)   amLODipine (NORVASC) 10 MG tablet Take 10 mg by mouth daily.   aspirin EC 81 MG tablet Take 1 tablet (81 mg total) by mouth 2 (two) times daily.   Cholecalciferol (VITAMIN D3) 25 MCG (1000 UT) CAPS Take 1,000 Units by mouth daily in the afternoon.   Cyanocobalamin (VITAMIN B-12) 5000 MCG TBDP Take 5,000 mcg by mouth daily.   doxycycline (VIBRA-TABS) 100 MG tablet Take 1 tablet (100 mg total) by mouth every 12 (twelve) hours for 5 days.   fluticasone (FLONASE) 50 MCG/ACT nasal spray Place 1 spray into both nostrils daily as needed for allergies or rhinitis. (Patient taking differently: Place 1 spray into both nostrils 2 (two) times daily.)   fluticasone-salmeterol (WIXELA INHUB) 250-50 MCG/ACT AEPB Inhale 1 puff into the lungs in the morning and at bedtime.   furosemide (LASIX) 20 MG tablet Take 1 tablet (20 mg total) by mouth every other day.   guaiFENesin (MUCINEX) 600 MG 12 hr tablet Take 1 tablet (600 mg total) by mouth 2 (two) times daily as needed for cough or to loosen phlegm.   ipratropium-albuterol (DUONEB) 0.5-2.5 (3) MG/3ML SOLN Take 3 mLs by nebulization every 4 (four) hours as needed.   losartan (COZAAR) 50 MG tablet Take 1 tablet (50 mg total)  by mouth daily.   montelukast (SINGULAIR) 10 MG tablet Take 1 tablet (10 mg total) by mouth daily.   predniSONE (DELTASONE) 50 MG tablet Take 1 tablet (50 mg total) by mouth daily with breakfast for 5 days.   rosuvastatin (CRESTOR) 5  MG tablet Take 5 mg by mouth See admin instructions. Take 5 mg by mouth 2 times a week (Wednesday and Thursday)   No facility-administered encounter medications on file as of 07/07/2023.    Allergies as of 07/07/2023 - Review Complete 07/07/2023  Allergen Reaction Noted   Penicillins Shortness Of Breath and Rash 09/06/2013   Alendronate sodium  06/22/2023   Cephalexin Itching 09/06/2013   Penicillin g  06/22/2023   Sulfa antibiotics Itching and Other (See Comments) 09/18/2013   Sulfacetamide sodium  06/22/2023   Other Itching 10/24/2013    Past Medical History:  Diagnosis Date   Age related osteoporosis    Allergic rhinitis    Aortic stenosis    Mild by echo 05/2023   Arthritis    Ascending aorta dilatation (HCC)    2D echo 06/10/2023 with ascending aorta measurement 40 mm   Asthma    Atherosclerosis of native artery of both lower extremities with intermittent claudication (HCC)    CAD (coronary artery disease), native coronary artery    coronary Ca score of 452 with <25% stenosis of prox to mid LAD and 25-49% prox RCA by coronary CTA 7/24   Cataract    Chronic kidney disease, stage 3a (HCC)    COPD (chronic obstructive pulmonary disease) (HCC)    Degenerative joint disease (DJD) of hip    Diabetic renal disease (HCC)    Dyspnea    with exertion   GERD (gastroesophageal reflux disease)    Headache    Hypercholesterolemia    Hypertension    Hypertensive retinopathy    Mitral regurgitation    Moderate to severe by echo 05/2023   Osteoarthritis of hip    Pneumonia    Pre-diabetes    A1c within normal limits last check   Pulmonary hypertension (HCC)    Mild to moderate with PASP 44 mmHg by echo 6-24   PVD (peripheral vascular disease) (HCC)    Sickle cell anemia (HCC)    trait   Sickle cell trait (HCC)    Tricuspid regurgitation    Moderate by echo 06/10/2023    Past Surgical History:  Procedure Laterality Date   ANKLE SURGERY Right 2002   pin   BREAST BIOPSY Right  11/01/2013   Procedure: RIGHT BREAST MASS EXCISION;  Surgeon: Emelia Loron, MD;  Location: [facility] OR;  Service: General;  Laterality: Right;   CATARACT EXTRACTION     ELBOW SURGERY Right    pin   MULTIPLE TOOTH EXTRACTIONS     TOTAL HIP ARTHROPLASTY Right 09/16/2020   Procedure: RIGHT TOTAL HIP ARTHROPLASTY ANTERIOR APPROACH;  Surgeon: Gean Birchwood, MD;  Location: [facility];  Service: Orthopedics;  Laterality: Right;   TUBAL LIGATION      No family history on file.  Social History   Socioeconomic History   Marital status: Widowed    Spouse name: Not on file   Number of children: Not on file   Years of education: Not on file   Highest education level: Not on file  Occupational History   Not on file  Tobacco Use   Smoking status: Never   Smokeless tobacco: Never   Tobacco comments:    occ wine  Vaping Use   Vaping  status: Never Used  Substance and Sexual Activity   Alcohol use: Yes    Comment: occ   Drug use: No   Sexual activity: Not on file  Other Topics Concern   Not on file  Social History Narrative   Not on file   Social Determinants of Health   Financial Resource Strain: Not on file  Food Insecurity: No Food Insecurity (07/05/2023)   Hunger Vital Sign    Worried About Running Out of Food in the Last Year: Never true    Ran Out of Food in the Last Year: Never true  Transportation Needs: No Transportation Needs (07/05/2023)   PRAPARE - Administrator, Civil Service (Medical): No    Lack of Transportation (Non-Medical): No  Physical Activity: Not on file  Stress: Not on file  Social Connections: Not on file  Intimate Partner Violence: Not At Risk (07/05/2023)   Humiliation, Afraid, Rape, and Kick questionnaire    Fear of Current or Ex-Partner: No    Emotionally Abused: No    Physically Abused: No    Sexually Abused: No    Review of systems: Review of Systems  Constitutional: Negative for fever and chills.  HENT: Negative.   Eyes: Negative for  blurred vision.  Respiratory: as per HPI  Cardiovascular: Negative for chest pain and palpitations.  Gastrointestinal: Negative for vomiting, diarrhea, blood per rectum. Genitourinary: Negative for dysuria, urgency, frequency and hematuria.  Musculoskeletal: Negative for myalgias, back pain and joint pain.  Skin: Negative for itching and rash.  Neurological: Negative for dizziness, tremors, focal weakness, seizures and loss of consciousness.  Endo/Heme/Allergies: Negative for environmental allergies.  Psychiatric/Behavioral: Negative for depression, suicidal ideas and hallucinations.  All other systems reviewed and are negative.  Physical Exam: Blood pressure 126/66, pulse (!) 108, temperature 98.5 F (36.9 C), temperature source Oral, height 5' (1.524 m), weight 169 lb 12.8 oz (77 kg), SpO2 98%. Gen:      No acute distress HEENT:  EOMI, sclera anicteric Neck:     No masses; no thyromegaly Lungs:    Clear to auscultation bilaterally; normal respiratory effort CV:         Regular rate and rhythm; no murmurs Abd:      + bowel sounds; soft, non-tender; no palpable masses, no distension Ext:    No edema; adequate peripheral perfusion Skin:      Warm and dry; no rash Neuro: alert and oriented x 3 Psych: normal mood and affect  Data Reviewed: Imaging: CT abdomen pelvis 08/28/2022-mild atelectasis at the base.  CT coronaries 06/29/2023- bibasilar linear atelectasis/scarring. I had reviewed the images personally  PFTs: Spirometry 05/28/2023 FVC 2.52 [147%], FEV1 0.69 [53%], F/F 0.27 Moderate obstructive airway disease  Labs: CBC 04/14/2022 WBC 8.2, eos 6%, absolute eosinophil count 492  FENO 07/07/2023- 54  Assessment:  Severe persistent asthma She is on good therapy with Wixela and Singulair but continues to have persistent symptoms and elevated FeNO.  She may be a candidate for biologic therapy Check CBC differential, IgE Schedule PFTs Follow-up in 1 to 2 months to  reassess  Plan/Recommendations: CBC with differential, IgE PFTs  Chilton Greathouse MD [facility] 07/07/2023, 1:55 PM  CC: Lorenda Ishihara,*

## 2023-07-07 NOTE — Patient Instructions (Addendum)
Will check some labs today including CBC differential, IgE Continue Wixela, Symbicort I will discuss with Dr. Dellis Anes if you are a candidate for any injection therapy Schedule PFTs and follow-up in 1 to 2 months.

## 2023-07-08 DIAGNOSIS — Z7951 Long term (current) use of inhaled steroids: Secondary | ICD-10-CM | POA: Diagnosis not present

## 2023-07-08 DIAGNOSIS — J441 Chronic obstructive pulmonary disease with (acute) exacerbation: Secondary | ICD-10-CM | POA: Diagnosis not present

## 2023-07-08 DIAGNOSIS — Z7982 Long term (current) use of aspirin: Secondary | ICD-10-CM | POA: Diagnosis not present

## 2023-07-08 DIAGNOSIS — Z9181 History of falling: Secondary | ICD-10-CM | POA: Diagnosis not present

## 2023-07-08 DIAGNOSIS — H269 Unspecified cataract: Secondary | ICD-10-CM | POA: Diagnosis not present

## 2023-07-08 DIAGNOSIS — M1611 Unilateral primary osteoarthritis, right hip: Secondary | ICD-10-CM | POA: Diagnosis not present

## 2023-07-08 DIAGNOSIS — Z7952 Long term (current) use of systemic steroids: Secondary | ICD-10-CM | POA: Diagnosis not present

## 2023-07-08 DIAGNOSIS — I10 Essential (primary) hypertension: Secondary | ICD-10-CM | POA: Diagnosis not present

## 2023-07-08 DIAGNOSIS — J9601 Acute respiratory failure with hypoxia: Secondary | ICD-10-CM | POA: Diagnosis not present

## 2023-07-08 DIAGNOSIS — J45901 Unspecified asthma with (acute) exacerbation: Secondary | ICD-10-CM | POA: Diagnosis not present

## 2023-07-08 DIAGNOSIS — K219 Gastro-esophageal reflux disease without esophagitis: Secondary | ICD-10-CM | POA: Diagnosis not present

## 2023-07-09 DIAGNOSIS — Z0000 Encounter for general adult medical examination without abnormal findings: Secondary | ICD-10-CM | POA: Diagnosis not present

## 2023-07-09 LAB — CULTURE, BLOOD (ROUTINE X 2)
Special Requests: ADEQUATE
Special Requests: ADEQUATE

## 2023-07-09 LAB — IGE: IgE (Immunoglobulin E), Serum: 4778 kU/L — ABNORMAL HIGH (ref <=114)

## 2023-07-10 LAB — CULTURE, BLOOD (ROUTINE X 2)
Culture: NO GROWTH
Culture: NO GROWTH

## 2023-07-13 NOTE — Telephone Encounter (Signed)
Called and left a voicemail asking for patient to return call to see how she is doing.  

## 2023-07-16 NOTE — Telephone Encounter (Signed)
I called and spoke with the patient. She stated that she is doing much better with her breathing and is using breathing treatments as needed. I asked if she felt that she needed to come in sooner for a follow up and she stated that she is good for now but will call if she needs anything.

## 2023-07-19 ENCOUNTER — Ambulatory Visit: Payer: Medicare Other | Attending: Cardiology | Admitting: Cardiology

## 2023-07-19 ENCOUNTER — Encounter: Payer: Self-pay | Admitting: Cardiology

## 2023-07-19 VITALS — BP 120/70 | HR 94 | Ht 60.0 in | Wt 166.8 lb

## 2023-07-19 DIAGNOSIS — I2510 Atherosclerotic heart disease of native coronary artery without angina pectoris: Secondary | ICD-10-CM

## 2023-07-19 DIAGNOSIS — R0602 Shortness of breath: Secondary | ICD-10-CM | POA: Diagnosis not present

## 2023-07-19 DIAGNOSIS — Z79899 Other long term (current) drug therapy: Secondary | ICD-10-CM | POA: Diagnosis not present

## 2023-07-19 DIAGNOSIS — I340 Nonrheumatic mitral (valve) insufficiency: Secondary | ICD-10-CM

## 2023-07-19 DIAGNOSIS — M25471 Effusion, right ankle: Secondary | ICD-10-CM | POA: Diagnosis not present

## 2023-07-19 DIAGNOSIS — I10 Essential (primary) hypertension: Secondary | ICD-10-CM | POA: Diagnosis not present

## 2023-07-19 DIAGNOSIS — M25472 Effusion, left ankle: Secondary | ICD-10-CM | POA: Diagnosis not present

## 2023-07-19 MED ORDER — ROSUVASTATIN CALCIUM 5 MG PO TABS
5.0000 mg | ORAL_TABLET | ORAL | 3 refills | Status: DC
Start: 2023-07-19 — End: 2023-08-10

## 2023-07-19 NOTE — Patient Instructions (Signed)
Medication Instructions:  Please INCREASE your crestor to 5 mg every other day.   *If you need a refill on your cardiac medications before your next appointment, please call your pharmacy*   Lab Work: Please schedule a day to complete FASTING labs (lipid panel and ALT) in our lab in 6 weeks.  If you have labs (blood work) drawn today and your tests are completely normal, you will receive your results only by: MyChart Message (if you have MyChart) OR A paper copy in the mail If you have any lab test that is abnormal or we need to change your treatment, we will call you to review the results.   Testing/Procedures: Your physician has requested that you have a TEE. During a TEE, sound waves are used to create images of your heart. It provides your doctor with information about the size and shape of your heart and how well your heart's chambers and valves are working. In this test, a transducer is attached to the end of a flexible tube that's guided down your throat and into your esophagus (the tube leading from you mouth to your stomach) to get a more detailed image of your heart. You are not awake for the procedure. Please see the instruction sheet given to you today. For further information please visit [email or web address]     Follow-Up: At [facility], you and your health needs are our priority.  As part of our continuing mission to provide you with exceptional heart care, we have created designated Provider Care Teams.  These Care Teams include your primary Cardiologist (physician) and Advanced Practice Providers (APPs -  Physician Assistants and Nurse Practitioners) who all work together to provide you with the care you need, when you need it.  We recommend signing up for the patient portal called "MyChart".  Sign up information is provided on this After Visit Summary.  MyChart is used to connect with patients for Virtual Visits (Telemedicine).  Patients are able to view lab/test  results, encounter notes, upcoming appointments, etc.  Non-urgent messages can be sent to your provider as well.   To learn more about what you can do with MyChart, go to [email or web address].    Your next appointment:   6 month(s)  Provider:   Armanda Magic, MD     Other Instructions You will need to stop eating at midnight the night before your TEE (transesophageal echocardiogram). This test will take place at [facility].

## 2023-07-19 NOTE — H&P (View-Only) (Signed)
 Cardiology  Note    Date:  07/19/2023   ID:  Mary Chase, DOB [birth year], MRN [id number]  PCP:  Mary Ishihara, MD  Cardiologist:  Mary Magic, MD   Chief Complaint  Patient presents with   Coronary Artery Disease   Hypertension   Hyperlipidemia   Shortness of Breath   Mitral Regurgitation     History of Present Illness:  Mary Chase is a 88 y.o. female  with a history of PAD, CKD stage III, COPD, diabetes mellitus complicated by renal disease, GERD, hyperlipidemia, hypertension and sickle cell anemia who was initially referred for evaluation of shortness of breath.    She was  hospitalized and discharged on 05/11/2023 after admission for acute hypoxic respiratory failure secondary to severe asthma exacerbation caused by Para Influenza viral infection.  She was started on IV steroids and transition to oral steroids. Her troponin was normal.   She subsequently was referred to cardiology to make sure there was not a cardiac etiology for her shortness of breath.  Apparently she was told years ago that she had an enlarged heart but she has not been seen by a Cardiologist and has not had any echo done. She has chronic SOB due to COPD and asthma but lately has been occurring on a daily basis and much worse with exertion.  This is new compared to when she had her asthma attacks.  Coronary CTA was done 06/29/2023 which demonstrated a coronary calcium score 452 which was 73rd percentile for age, gender and sex matched controls as well as mild nonobstructive CAD with less than 25% stenosis of the proximal to mid LAD, 25 to 49% mixed plaque in the proximal RCA and normal left main and left circumflex.  2D echo 06/04/2023 showed EF 60 to 65% with grade 1 diastolic dysfunction mild pulmonary hypertension with PASP 44.5 mmHg, moderate biatrial enlargement, moderate to severe MR, moderate TR and mild aortic valve stenosis with a mean aortic valve gradient 9 mmHg.  She also had a mildly  dilated ascending aorta at 40 mm.  She was seen by Mary Newcomer, PA to follow-up on her echo results and recommend TEE.  This was discussed with patient and the daughter and initially they were supposed to proceed but then the patient decided she did not want to proceed with this until she talk to me.  She is here today for followup and is doing well.  SHe denies any chest pain or pressure, SOB, DOE, PND, orthopnea, LE edema, dizziness, palpitations or syncope. She is compliant with her meds and is tolerating meds with no SE.    Past Medical History:  Diagnosis Date   Age related osteoporosis    Allergic rhinitis    Aortic stenosis    Mild by echo 05/2023   Arthritis    Ascending aorta dilatation (HCC)    2D echo 06/10/2023 with ascending aorta measurement 40 mm   Asthma    Atherosclerosis of native artery of both lower extremities with intermittent claudication (HCC)    CAD (coronary artery disease), native coronary artery    coronary Ca score of 452 with <25% stenosis of prox to mid LAD and 25-49% prox RCA by coronary CTA 7/24   Cataract    Chronic kidney disease, stage 3a (HCC)    COPD (chronic obstructive pulmonary disease) (HCC)    Degenerative joint disease (DJD) of hip    Diabetic renal disease (HCC)    Dyspnea    with  exertion   GERD (gastroesophageal reflux disease)    Headache    Hypercholesterolemia    Hypertension    Hypertensive retinopathy    Mitral regurgitation    Moderate to severe by echo 05/2023   Osteoarthritis of hip    Pneumonia    Pre-diabetes    A1c within normal limits last check   Pulmonary hypertension (HCC)    Mild to moderate with PASP 44 mmHg by echo 6-24   PVD (peripheral vascular disease) (HCC)    Sickle cell anemia (HCC)    trait   Sickle cell trait (HCC)    Tricuspid regurgitation    Moderate by echo 06/10/2023    Past Surgical History:  Procedure Laterality Date   ANKLE SURGERY Right 2002   pin   BREAST BIOPSY Right 11/01/2013    Procedure: RIGHT BREAST MASS EXCISION;  Surgeon: Mary Loron, MD;  Location: [facility] OR;  Service: General;  Laterality: Right;   CATARACT EXTRACTION     ELBOW SURGERY Right    pin   MULTIPLE TOOTH EXTRACTIONS     TOTAL HIP ARTHROPLASTY Right 09/16/2020   Procedure: RIGHT TOTAL HIP ARTHROPLASTY ANTERIOR APPROACH;  Surgeon: Gean Birchwood, MD;  Location: [facility];  Service: Orthopedics;  Laterality: Right;   TUBAL LIGATION      Current Medications: Current Meds  Medication Sig   acetaminophen (TYLENOL) 500 MG tablet Take 500 mg by mouth every 6 (six) hours as needed.   albuterol (VENTOLIN HFA) 108 (90 Base) MCG/ACT inhaler INHALE 2 PUFFS INTO THE LUNGS EVERY 6 HOURS AS NEEDED FOR WHEEZE (Patient taking differently: Inhale 1 puff into the lungs every 6 (six) hours as needed for wheezing or shortness of breath.)   amLODipine (NORVASC) 10 MG tablet Take 10 mg by mouth daily.   aspirin EC 81 MG tablet Take 1 tablet (81 mg total) by mouth 2 (two) times daily.   Cholecalciferol (VITAMIN D3) 25 MCG (1000 UT) CAPS Take 1,000 Units by mouth daily in the afternoon.   Cyanocobalamin (VITAMIN B-12) 5000 MCG TBDP Take 5,000 mcg by mouth daily.   fluticasone (FLONASE) 50 MCG/ACT nasal spray Place 1 spray into both nostrils daily as needed for allergies or rhinitis. (Patient taking differently: Place 1 spray into both nostrils 2 (two) times daily.)   fluticasone-salmeterol (WIXELA INHUB) 250-50 MCG/ACT AEPB Inhale 1 puff into the lungs in the morning and at bedtime.   furosemide (LASIX) 20 MG tablet Take 1 tablet (20 mg total) by mouth every other day.   ipratropium-albuterol (DUONEB) 0.5-2.5 (3) MG/3ML SOLN Take 3 mLs by nebulization every 4 (four) hours as needed.   losartan (COZAAR) 50 MG tablet Take 1 tablet (50 mg total) by mouth daily.   montelukast (SINGULAIR) 10 MG tablet Take 1 tablet (10 mg total) by mouth daily.   rosuvastatin (CRESTOR) 5 MG tablet Take 5 mg by mouth See admin instructions. Take 5  mg by mouth 2 times a week (Wednesday and Thursday)    Allergies:   Penicillins, Alendronate sodium, Cephalexin, Penicillin g, Sulfa antibiotics, Sulfacetamide sodium, and Other   Social History   Socioeconomic History   Marital status: Widowed    Spouse name: Not on file   Number of children: Not on file   Years of education: Not on file   Highest education level: Not on file  Occupational History   Not on file  Tobacco Use   Smoking status: Never   Smokeless tobacco: Never   Tobacco comments:  occ wine  Vaping Use   Vaping status: Never Used  Substance and Sexual Activity   Alcohol use: Yes    Comment: occ   Drug use: No   Sexual activity: Not on file  Other Topics Concern   Not on file  Social History Narrative   Not on file   Social Determinants of Health   Financial Resource Strain: Not on file  Food Insecurity: No Food Insecurity (07/05/2023)   Hunger Vital Sign    Worried About Running Out of Food in the Last Year: Never true    Ran Out of Food in the Last Year: Never true  Transportation Needs: No Transportation Needs (07/05/2023)   PRAPARE - Administrator, Civil Service (Medical): No    Lack of Transportation (Non-Medical): No  Physical Activity: Not on file  Stress: Not on file  Social Connections: Not on file     Family History:  The patient's family history is not on file.   ROS:   Please see the history of present illness.    ROS All other systems reviewed and are negative.      No data to display             PHYSICAL EXAM:   VS:  BP 120/70 (BP Location: Right Arm, Patient Position: Sitting, Cuff Size: Normal)   Pulse 94   Ht 5' (1.524 m)   Wt 166 lb 12.8 oz (75.7 kg)   SpO2 91%   BMI 32.58 kg/m    GEN: Well nourished, well developed in no acute distress HEENT: Normal NECK: No JVD; No carotid bruits LYMPHATICS: No lymphadenopathy CARDIAC:RRR, no  rubs, gallops.  2/6 SM at LLSB>apex RESPIRATORY:  Clear to  auscultation without rales, wheezing or rhonchi  ABDOMEN: Soft, non-tender, non-distended MUSCULOSKELETAL:  No edema; No deformity  SKIN: Warm and dry NEUROLOGIC:  Alert and oriented x 3 PSYCHIATRIC:  Normal affect  Wt Readings from Last 3 Encounters:  07/19/23 166 lb 12.8 oz (75.7 kg)  07/07/23 169 lb 12.8 oz (77 kg)  07/06/23 168 lb 6.4 oz (76.4 kg)      Studies/Labs Reviewed:    Cardiac Studies & Procedures       ECHOCARDIOGRAM  ECHOCARDIOGRAM COMPLETE 06/04/2023  Narrative ECHOCARDIOGRAM REPORT    Patient Name:   Milo Siegle Ericsson Date of Exam: 06/04/2023 Medical Rec #:  [id number]        Height:       59.5 in Accession #:    [id number]       Weight:       174.2 lb Date of Birth:  Feb 09, 1935       BSA:          1.749 m Patient Age:    87 years         BP:           140/78 mmHg Patient Gender: F                HR:           81 bpm. Exam Location:  [address]  Procedure: 2D Echo, 3D Echo, Cardiac Doppler and Color Doppler  Indications:    R06.00 Dyspnea  History:        Patient has no prior history of Echocardiogram examinations. PAD and COPD, Signs/Symptoms:Shortness of Breath and Fever; Risk Factors:Hypertension and Dyslipidemia. Sickle Cell Trait, Chronic Kidney Disease.  Sonographer:    Farrel Conners RDCS Referring Phys: Quintella Reichert  IMPRESSIONS   1. Left ventricular ejection fraction, by estimation, is 60 to 65%. The left ventricle has normal function. The left ventricle has no regional wall motion abnormalities. Left ventricular diastolic parameters are consistent with Grade I diastolic dysfunction (impaired relaxation). 2. Right ventricular systolic function is normal. The right ventricular size is normal. There is mildly elevated pulmonary artery systolic pressure. The estimated right ventricular systolic pressure is 44.5 mmHg. 3. Left atrial size was moderately dilated. 4. Right atrial size was moderately dilated. 5. The mitral valve is normal  in structure. Moderate to severe mitral valve regurgitation. No evidence of mitral stenosis. 6. Tricuspid valve regurgitation is moderate. 7. The aortic valve is tricuspid. There is mild calcification of the aortic valve. Aortic valve regurgitation is trivial. Mild aortic valve stenosis. Aortic valve area, by VTI measures 1.86 cm. Aortic valve mean gradient measures 9.2 mmHg. Aortic valve Vmax measures 2.07 m/s. 8. Aortic dilatation noted. There is mild dilatation of the ascending aorta, measuring 40 mm. 9. The inferior vena cava is dilated in size with >50% respiratory variability, suggesting right atrial pressure of 8 mmHg.  FINDINGS Left Ventricle: Left ventricular ejection fraction, by estimation, is 60 to 65%. The left ventricle has normal function. The left ventricle has no regional wall motion abnormalities. The left ventricular internal cavity size was normal in size. There is no left ventricular hypertrophy. Left ventricular diastolic parameters are consistent with Grade I diastolic dysfunction (impaired relaxation).  Right Ventricle: The right ventricular size is normal. No increase in right ventricular wall thickness. Right ventricular systolic function is normal. There is mildly elevated pulmonary artery systolic pressure. The tricuspid regurgitant velocity is 3.02 m/s, and with an assumed right atrial pressure of 8 mmHg, the estimated right ventricular systolic pressure is 44.5 mmHg.  Left Atrium: Left atrial size was moderately dilated.  Right Atrium: Right atrial size was moderately dilated.  Pericardium: There is no evidence of pericardial effusion.  Mitral Valve: The mitral valve is normal in structure. Moderate to severe mitral valve regurgitation, with centrally-directed jet. No evidence of mitral valve stenosis.  Tricuspid Valve: The tricuspid valve is normal in structure. Tricuspid valve regurgitation is moderate . No evidence of tricuspid stenosis.  Aortic Valve: The  aortic valve is tricuspid. There is mild calcification of the aortic valve. Aortic valve regurgitation is trivial. Aortic regurgitation PHT measures 298 msec. Mild aortic stenosis is present. Aortic valve mean gradient measures 9.2 mmHg. Aortic valve peak gradient measures 17.2 mmHg. Aortic valve area, by VTI measures 1.86 cm.  Pulmonic Valve: The pulmonic valve was normal in structure. Pulmonic valve regurgitation is trivial. No evidence of pulmonic stenosis.  Aorta: Aortic dilatation noted. There is mild dilatation of the ascending aorta, measuring 40 mm.  Venous: The inferior vena cava is dilated in size with greater than 50% respiratory variability, suggesting right atrial pressure of 8 mmHg.  IAS/Shunts: No atrial level shunt detected by color flow Doppler.   LEFT VENTRICLE PLAX 2D LVIDd:         3.90 cm   Diastology LVIDs:         2.20 cm   LV e' medial:    7.83 cm/s LV PW:         1.00 cm   LV E/e' medial:  14.8 LV IVS:        0.90 cm   LV e' lateral:   6.96 cm/s LVOT diam:     2.20 cm   LV E/e' lateral: 16.6 LV SV:  73 LV SV Index:   42 LVOT Area:     3.80 cm  3D Volume EF: 3D EF:        65 % LV EDV:       113 ml LV ESV:       40 ml LV SV:        73 ml  RIGHT VENTRICLE RV Basal diam:  3.40 cm RV S prime:     14.90 cm/s TAPSE (M-mode): 1.6 cm RVSP:           39.5 mmHg  LEFT ATRIUM             Index        RIGHT ATRIUM           Index LA diam:        4.00 cm 2.29 cm/m   RA Pressure: 3.00 mmHg LA Vol (A2C):   61.9 ml 35.39 ml/m  RA Area:     20.80 cm LA Vol (A4C):   52.3 ml 29.90 ml/m  RA Volume:   64.00 ml  36.59 ml/m LA Biplane Vol: 58.1 ml 33.21 ml/m AORTIC VALVE AV Area (Vmax):    1.77 cm AV Area (Vmean):   1.77 cm AV Area (VTI):     1.86 cm AV Vmax:           207.25 cm/s AV Vmean:          139.750 cm/s AV VTI:            0.391 m AV Peak Grad:      17.2 mmHg AV Mean Grad:      9.2 mmHg LVOT Vmax:         96.70 cm/s LVOT Vmean:        65.000  cm/s LVOT VTI:          0.192 m LVOT/AV VTI ratio: 0.49 AI PHT:            298 msec  AORTA Ao Root diam: 3.40 cm Ao Asc diam:  3.80 cm  MITRAL VALVE                TRICUSPID VALVE MV Area (PHT): cm          TR Peak grad:   36.5 mmHg MV Decel Time: 192 msec     TR Vmax:        302.00 cm/s MR Peak grad: 123.2 mmHg    Estimated RAP:  3.00 mmHg MR Mean grad: 67.0 mmHg     RVSP:           39.5 mmHg MR Vmax:      555.00 cm/s MR Vmean:     378.0 cm/s    SHUNTS MV E velocity: 115.50 cm/s  Systemic VTI:  0.19 m MV A velocity: 127.00 cm/s  Systemic Diam: 2.20 cm MV E/A ratio:  0.91  Arvilla Meres MD Electronically signed by Arvilla Meres MD Signature Date/Time: 06/04/2023/12:42:56 PM    Final     CT SCANS  CT CORONARY MORPH W/CTA COR W/SCORE 06/29/2023  Addendum 06/29/2023 10:33 PM ADDENDUM REPORT: 06/29/2023 22:31  EXAM: OVER-READ INTERPRETATION  CT CHEST  The following report is an over-read performed by radiologist Dr. Arnoldo Morale [facility] on 06/29/2023. This over-read does not include interpretation of cardiac or coronary anatomy or pathology. The coronary CTA interpretation by the cardiologist is attached.  COMPARISON:  Chest radiograph dated 05/04/2023.  FINDINGS: No adenopathy noted.  Bibasilar linear atelectasis/scarring. The visualized  lungs are otherwise clear.  No acute osseous pathology.  Degenerative changes of the spine.  IMPRESSION: No acute extracardiac findings.   Electronically Signed By: Elgie Collard M.D. On: 06/29/2023 22:31  Narrative CLINICAL DATA:  Chest pain  EXAM: Cardiac CTA  MEDICATIONS: Sub lingual nitro. 4mg  x 2  TECHNIQUE: The patient was scanned on a Siemens 192 slice scanner. Gantry rotation speed was 250 msecs. Collimation was 0.6 mm. A 100 kV prospective scan was triggered in the ascending thoracic aorta at 35-75% of the R-R interval. Average HR during the scan was 60 bpm. The 3D data set was  interpreted on a dedicated work station using MPR, MIP and VRT modes. A total of 80cc of contrast was used.  FINDINGS: Non-cardiac: See separate report from [facility].  Pulmonary veins drain normally to the left atrium.  Calcium Score: 452 Agatston units.  Coronary Arteries: Right dominant with no anomalies  LM: No plaque or stenosis.  LAD system: Mixed plaque, mild stenosis in the proximal to mid LAD (1-24%).  Circumflex system: Small LCX, no plaque or stenosis.  RCA system: Mixed plaque in the proximal RCA, mild (25-49%) stenosis.  IMPRESSION: 1. Coronary artery calcium score 452 Agatston units. This places the patient in the 73rd percentile for age and gender, suggesting intermediate risk for future cardiac events.  2.  Mild nonobstructive CAD.  [employer] account executive  Electronically Signed: By: Marca Ancona M.D. On: 06/29/2023 21:22          Recent Labs: 05/10/2023: Magnesium 1.7 06/04/2023: TSH 2.840 07/05/2023: B Natriuretic Peptide 17.4 07/06/2023: ALT 10; BUN 15; Creatinine, Ser 1.07; Potassium 3.4; Sodium 134 07/07/2023: Hemoglobin 11.2; Platelets 277.0   Lipid Panel    Component Value Date/Time   CHOL 177 07/06/2023 0039   TRIG 67 07/06/2023 0039   HDL 70 07/06/2023 0039   CHOLHDL 2.5 07/06/2023 0039   VLDL 13 07/06/2023 0039   LDLCALC 94 07/06/2023 0039     ASSESSMENT:    1. SOB (shortness of breath)   2. Primary hypertension   3. Edema of both ankles   4. Coronary artery disease involving native coronary artery of native heart without angina pectoris   5. Nonrheumatic mitral valve regurgitation       PLAN:  In order of problems listed above:  Dyspnea on exertion/PHTN -Shortness of breath related to underlying lung disease with asthma intermittently requiring steroids -Hemoglobin was normal at 12.4 on 05/10/2023 -Coronary CTA with mild nonobstructive CAD -2D echo with normal LV function 06/10/2023 and grade 1 diastolic dysfunction with  mild pulmonary hypertension PASP 44 mmHg likely related to her asthma -TSH normal on 06/04/2023 -Repeat echo 05/2024 to reassess pulmonary hypertension  2.  Hypertension -BP is controlled on exam today continue prescription drug management -With amlodipine 10 mg daily and losartan 50 mg daily with as needed refills  3.  LE edema -Exacerbated recently by steroids for asthma -Continue drug management with Lasix 20 mg daily with as needed refills -Her BNP has been normal -I have personally reviewed and interpreted outside labs performed by patient's PCP which showed serum creatinine 1.07 potassium 3.4  4.  Coronary artery calcifications -Coronary CTA 2024 demonstrated  coronary calcium score 452 which was 73rd percentile for age, gender and sex matched controls as well as mild nonobstructive CAD with <25% stenosis of the proximal to mid LAD, 25 to 49% mixed plaque in the proximal RCA and normal left main and left circumflex. -She denies any anginal symptoms -Continue prescription  drug management with aspirin 81 mg daily and Crestor   5.  Mitral regurgitation -moderate to severe by rec recent echocardiogram -She was supposed to be set up for TEE but then decided she wanted to talk to me further before undergoing the procedure. -I recommended that she proceed with a TEE to try to better define her her severe mitral regurgitation as that is likely contributing to her worsening shortness of breath -Informed Consent   Shared Decision Making/Informed Consent The risks [esophageal damage, perforation (1:10,000 risk), bleeding, pharyngeal hematoma as well as other potential complications associated with conscious sedation including aspiration, arrhythmia, respiratory failure and death], benefits (treatment guidance and diagnostic support) and alternatives of a transesophageal echocardiogram were discussed in detail with Mary Chase and she is willing to proceed.     6.  Hyperlipidemia -LDL goal less than  70 -I have personally reviewed and interpreted outside labs performed by patient's PCP which showed LDL 94 and HDL 70 on 07/06/2023 -I have instructed her to try to increase her Crestor to 5 mg every other day -Repeat FLP and ALT in 6 weeks -She will let me know if she has any side effects to the higher dose   Followup: 6 months  Medication Adjustments/Labs and Tests Ordered: Current medicines are reviewed at length with the patient today.  Concerns regarding medicines are outlined above.  Medication changes, Labs and Tests ordered today are listed in the Patient Instructions below.  There are no Patient Instructions on file for this visit.   Signed, Mary Magic, MD  07/19/2023 2:44 PM    [facility] [address] [place], [place], [place]  [address] Phone: [phone]; Fax: [phone]

## 2023-07-19 NOTE — Addendum Note (Signed)
Addended by: Luellen Pucker on: 07/19/2023 04:16 PM   Modules accepted: Orders

## 2023-07-19 NOTE — Progress Notes (Signed)
Cardiology  Note    Date:  07/19/2023   ID:  Mary Chase, DOB [birth year], MRN [id number]  PCP:  Lorenda Ishihara, MD  Cardiologist:  Armanda Magic, MD   Chief Complaint  Patient presents with   Coronary Artery Disease   Hypertension   Hyperlipidemia   Shortness of Breath   Mitral Regurgitation     History of Present Illness:  Mary Chase is a 88 y.o. female  with a history of PAD, CKD stage III, COPD, diabetes mellitus complicated by renal disease, GERD, hyperlipidemia, hypertension and sickle cell anemia who was initially referred for evaluation of shortness of breath.    She was  hospitalized and discharged on 05/11/2023 after admission for acute hypoxic respiratory failure secondary to severe asthma exacerbation caused by Para Influenza viral infection.  She was started on IV steroids and transition to oral steroids. Her troponin was normal.   She subsequently was referred to cardiology to make sure there was not a cardiac etiology for her shortness of breath.  Apparently she was told years ago that she had an enlarged heart but she has not been seen by a Cardiologist and has not had any echo done. She has chronic SOB due to COPD and asthma but lately has been occurring on a daily basis and much worse with exertion.  This is new compared to when she had her asthma attacks.  Coronary CTA was done 06/29/2023 which demonstrated a coronary calcium score 452 which was 73rd percentile for age, gender and sex matched controls as well as mild nonobstructive CAD with less than 25% stenosis of the proximal to mid LAD, 25 to 49% mixed plaque in the proximal RCA and normal left main and left circumflex.  2D echo 06/04/2023 showed EF 60 to 65% with grade 1 diastolic dysfunction mild pulmonary hypertension with PASP 44.5 mmHg, moderate biatrial enlargement, moderate to severe MR, moderate TR and mild aortic valve stenosis with a mean aortic valve gradient 9 mmHg.  She also had a mildly  dilated ascending aorta at 40 mm.  She was seen by Tereso Newcomer, PA to follow-up on her echo results and recommend TEE.  This was discussed with patient and the daughter and initially they were supposed to proceed but then the patient decided she did not want to proceed with this until she talk to me.  She is here today for followup and is doing well.  SHe denies any chest pain or pressure, SOB, DOE, PND, orthopnea, LE edema, dizziness, palpitations or syncope. She is compliant with her meds and is tolerating meds with no SE.    Past Medical History:  Diagnosis Date   Age related osteoporosis    Allergic rhinitis    Aortic stenosis    Mild by echo 05/2023   Arthritis    Ascending aorta dilatation (HCC)    2D echo 06/10/2023 with ascending aorta measurement 40 mm   Asthma    Atherosclerosis of native artery of both lower extremities with intermittent claudication (HCC)    CAD (coronary artery disease), native coronary artery    coronary Ca score of 452 with <25% stenosis of prox to mid LAD and 25-49% prox RCA by coronary CTA 7/24   Cataract    Chronic kidney disease, stage 3a (HCC)    COPD (chronic obstructive pulmonary disease) (HCC)    Degenerative joint disease (DJD) of hip    Diabetic renal disease (HCC)    Dyspnea    with  exertion   GERD (gastroesophageal reflux disease)    Headache    Hypercholesterolemia    Hypertension    Hypertensive retinopathy    Mitral regurgitation    Moderate to severe by echo 05/2023   Osteoarthritis of hip    Pneumonia    Pre-diabetes    A1c within normal limits last check   Pulmonary hypertension (HCC)    Mild to moderate with PASP 44 mmHg by echo 6-24   PVD (peripheral vascular disease) (HCC)    Sickle cell anemia (HCC)    trait   Sickle cell trait (HCC)    Tricuspid regurgitation    Moderate by echo 06/10/2023    Past Surgical History:  Procedure Laterality Date   ANKLE SURGERY Right 2002   pin   BREAST BIOPSY Right 11/01/2013    Procedure: RIGHT BREAST MASS EXCISION;  Surgeon: Emelia Loron, MD;  Location: [facility];  Service: General;  Laterality: Right;   CATARACT EXTRACTION     ELBOW SURGERY Right    pin   MULTIPLE TOOTH EXTRACTIONS     TOTAL HIP ARTHROPLASTY Right 09/16/2020   Procedure: RIGHT TOTAL HIP ARTHROPLASTY ANTERIOR APPROACH;  Surgeon: Gean Birchwood, MD;  Location: [facility];  Service: Orthopedics;  Laterality: Right;   TUBAL LIGATION      Current Medications: Current Meds  Medication Sig   acetaminophen (TYLENOL) 500 MG tablet Take 500 mg by mouth every 6 (six) hours as needed.   albuterol (VENTOLIN HFA) 108 (90 Base) MCG/ACT inhaler INHALE 2 PUFFS INTO THE LUNGS EVERY 6 HOURS AS NEEDED FOR WHEEZE (Patient taking differently: Inhale 1 puff into the lungs every 6 (six) hours as needed for wheezing or shortness of breath.)   amLODipine (NORVASC) 10 MG tablet Take 10 mg by mouth daily.   aspirin EC 81 MG tablet Take 1 tablet (81 mg total) by mouth 2 (two) times daily.   Cholecalciferol (VITAMIN D3) 25 MCG (1000 UT) CAPS Take 1,000 Units by mouth daily in the afternoon.   Cyanocobalamin (VITAMIN B-12) 5000 MCG TBDP Take 5,000 mcg by mouth daily.   fluticasone (FLONASE) 50 MCG/ACT nasal spray Place 1 spray into both nostrils daily as needed for allergies or rhinitis. (Patient taking differently: Place 1 spray into both nostrils 2 (two) times daily.)   fluticasone-salmeterol (WIXELA INHUB) 250-50 MCG/ACT AEPB Inhale 1 puff into the lungs in the morning and at bedtime.   furosemide (LASIX) 20 MG tablet Take 1 tablet (20 mg total) by mouth every other day.   ipratropium-albuterol (DUONEB) 0.5-2.5 (3) MG/3ML SOLN Take 3 mLs by nebulization every 4 (four) hours as needed.   losartan (COZAAR) 50 MG tablet Take 1 tablet (50 mg total) by mouth daily.   montelukast (SINGULAIR) 10 MG tablet Take 1 tablet (10 mg total) by mouth daily.   rosuvastatin (CRESTOR) 5 MG tablet Take 5 mg by mouth See admin instructions. Take 5  mg by mouth 2 times a week (Wednesday and Thursday)    Allergies:   Penicillins, Alendronate sodium, Cephalexin, Penicillin g, Sulfa antibiotics, Sulfacetamide sodium, and Other   Social History   Socioeconomic History   Marital status: Widowed    Spouse name: Not on file   Number of children: Not on file   Years of education: Not on file   Highest education level: Not on file  Occupational History   Not on file  Tobacco Use   Smoking status: Never   Smokeless tobacco: Never   Tobacco comments:  occ wine  Vaping Use   Vaping status: Never Used  Substance and Sexual Activity   Alcohol use: Yes    Comment: occ   Drug use: No   Sexual activity: Not on file  Other Topics Concern   Not on file  Social History Narrative   Not on file   Social Determinants of Health   Financial Resource Strain: Not on file  Food Insecurity: No Food Insecurity (07/05/2023)   Hunger Vital Sign    Worried About Running Out of Food in the Last Year: Never true    Ran Out of Food in the Last Year: Never true  Transportation Needs: No Transportation Needs (07/05/2023)   PRAPARE - Administrator, Civil Service (Medical): No    Lack of Transportation (Non-Medical): No  Physical Activity: Not on file  Stress: Not on file  Social Connections: Not on file     Family History:  The patient's family history is not on file.   ROS:   Please see the history of present illness.    ROS All other systems reviewed and are negative.      No data to display             PHYSICAL EXAM:   VS:  BP 120/70 (BP Location: Right Arm, Patient Position: Sitting, Cuff Size: Normal)   Pulse 94   Ht 5' (1.524 m)   Wt 166 lb 12.8 oz (75.7 kg)   SpO2 91%   BMI 32.58 kg/m    GEN: Well nourished, well developed in no acute distress HEENT: Normal NECK: No JVD; No carotid bruits LYMPHATICS: No lymphadenopathy CARDIAC:RRR, no  rubs, gallops.  2/6 SM at LLSB>apex RESPIRATORY:  Clear to  auscultation without rales, wheezing or rhonchi  ABDOMEN: Soft, non-tender, non-distended MUSCULOSKELETAL:  No edema; No deformity  SKIN: Warm and dry NEUROLOGIC:  Alert and oriented x 3 PSYCHIATRIC:  Normal affect  Wt Readings from Last 3 Encounters:  07/19/23 166 lb 12.8 oz (75.7 kg)  07/07/23 169 lb 12.8 oz (77 kg)  07/06/23 168 lb 6.4 oz (76.4 kg)      Studies/Labs Reviewed:    Cardiac Studies & Procedures       ECHOCARDIOGRAM  ECHOCARDIOGRAM COMPLETE 06/04/2023  Narrative ECHOCARDIOGRAM REPORT    Patient Name:   Milo Siegle Ericsson Date of Exam: 06/04/2023 Medical Rec #:  [id number]        Height:       59.5 in Accession #:    [id number]       Weight:       174.2 lb Date of Birth:  Feb 09, 1935       BSA:          1.749 m Patient Age:    87 years         BP:           140/78 mmHg Patient Gender: F                HR:           81 bpm. Exam Location:  [address]  Procedure: 2D Echo, 3D Echo, Cardiac Doppler and Color Doppler  Indications:    R06.00 Dyspnea  History:        Patient has no prior history of Echocardiogram examinations. PAD and COPD, Signs/Symptoms:Shortness of Breath and Fever; Risk Factors:Hypertension and Dyslipidemia. Sickle Cell Trait, Chronic Kidney Disease.  Sonographer:    Farrel Conners RDCS Referring Phys: Quintella Reichert  IMPRESSIONS   1. Left ventricular ejection fraction, by estimation, is 60 to 65%. The left ventricle has normal function. The left ventricle has no regional wall motion abnormalities. Left ventricular diastolic parameters are consistent with Grade I diastolic dysfunction (impaired relaxation). 2. Right ventricular systolic function is normal. The right ventricular size is normal. There is mildly elevated pulmonary artery systolic pressure. The estimated right ventricular systolic pressure is 44.5 mmHg. 3. Left atrial size was moderately dilated. 4. Right atrial size was moderately dilated. 5. The mitral valve is normal  in structure. Moderate to severe mitral valve regurgitation. No evidence of mitral stenosis. 6. Tricuspid valve regurgitation is moderate. 7. The aortic valve is tricuspid. There is mild calcification of the aortic valve. Aortic valve regurgitation is trivial. Mild aortic valve stenosis. Aortic valve area, by VTI measures 1.86 cm. Aortic valve mean gradient measures 9.2 mmHg. Aortic valve Vmax measures 2.07 m/s. 8. Aortic dilatation noted. There is mild dilatation of the ascending aorta, measuring 40 mm. 9. The inferior vena cava is dilated in size with >50% respiratory variability, suggesting right atrial pressure of 8 mmHg.  FINDINGS Left Ventricle: Left ventricular ejection fraction, by estimation, is 60 to 65%. The left ventricle has normal function. The left ventricle has no regional wall motion abnormalities. The left ventricular internal cavity size was normal in size. There is no left ventricular hypertrophy. Left ventricular diastolic parameters are consistent with Grade I diastolic dysfunction (impaired relaxation).  Right Ventricle: The right ventricular size is normal. No increase in right ventricular wall thickness. Right ventricular systolic function is normal. There is mildly elevated pulmonary artery systolic pressure. The tricuspid regurgitant velocity is 3.02 m/s, and with an assumed right atrial pressure of 8 mmHg, the estimated right ventricular systolic pressure is 44.5 mmHg.  Left Atrium: Left atrial size was moderately dilated.  Right Atrium: Right atrial size was moderately dilated.  Pericardium: There is no evidence of pericardial effusion.  Mitral Valve: The mitral valve is normal in structure. Moderate to severe mitral valve regurgitation, with centrally-directed jet. No evidence of mitral valve stenosis.  Tricuspid Valve: The tricuspid valve is normal in structure. Tricuspid valve regurgitation is moderate . No evidence of tricuspid stenosis.  Aortic Valve: The  aortic valve is tricuspid. There is mild calcification of the aortic valve. Aortic valve regurgitation is trivial. Aortic regurgitation PHT measures 298 msec. Mild aortic stenosis is present. Aortic valve mean gradient measures 9.2 mmHg. Aortic valve peak gradient measures 17.2 mmHg. Aortic valve area, by VTI measures 1.86 cm.  Pulmonic Valve: The pulmonic valve was normal in structure. Pulmonic valve regurgitation is trivial. No evidence of pulmonic stenosis.  Aorta: Aortic dilatation noted. There is mild dilatation of the ascending aorta, measuring 40 mm.  Venous: The inferior vena cava is dilated in size with greater than 50% respiratory variability, suggesting right atrial pressure of 8 mmHg.  IAS/Shunts: No atrial level shunt detected by color flow Doppler.   LEFT VENTRICLE PLAX 2D LVIDd:         3.90 cm   Diastology LVIDs:         2.20 cm   LV e' medial:    7.83 cm/s LV PW:         1.00 cm   LV E/e' medial:  14.8 LV IVS:        0.90 cm   LV e' lateral:   6.96 cm/s LVOT diam:     2.20 cm   LV E/e' lateral: 16.6 LV SV:  73 LV SV Index:   42 LVOT Area:     3.80 cm  3D Volume EF: 3D EF:        65 % LV EDV:       113 ml LV ESV:       40 ml LV SV:        73 ml  RIGHT VENTRICLE RV Basal diam:  3.40 cm RV S prime:     14.90 cm/s TAPSE (M-mode): 1.6 cm RVSP:           39.5 mmHg  LEFT ATRIUM             Index        RIGHT ATRIUM           Index LA diam:        4.00 cm 2.29 cm/m   RA Pressure: 3.00 mmHg LA Vol (A2C):   61.9 ml 35.39 ml/m  RA Area:     20.80 cm LA Vol (A4C):   52.3 ml 29.90 ml/m  RA Volume:   64.00 ml  36.59 ml/m LA Biplane Vol: 58.1 ml 33.21 ml/m AORTIC VALVE AV Area (Vmax):    1.77 cm AV Area (Vmean):   1.77 cm AV Area (VTI):     1.86 cm AV Vmax:           207.25 cm/s AV Vmean:          139.750 cm/s AV VTI:            0.391 m AV Peak Grad:      17.2 mmHg AV Mean Grad:      9.2 mmHg LVOT Vmax:         96.70 cm/s LVOT Vmean:        65.000  cm/s LVOT VTI:          0.192 m LVOT/AV VTI ratio: 0.49 AI PHT:            298 msec  AORTA Ao Root diam: 3.40 cm Ao Asc diam:  3.80 cm  MITRAL VALVE                TRICUSPID VALVE MV Area (PHT): cm          TR Peak grad:   36.5 mmHg MV Decel Time: 192 msec     TR Vmax:        302.00 cm/s MR Peak grad: 123.2 mmHg    Estimated RAP:  3.00 mmHg MR Mean grad: 67.0 mmHg     RVSP:           39.5 mmHg MR Vmax:      555.00 cm/s MR Vmean:     378.0 cm/s    SHUNTS MV E velocity: 115.50 cm/s  Systemic VTI:  0.19 m MV A velocity: 127.00 cm/s  Systemic Diam: 2.20 cm MV E/A ratio:  0.91  Arvilla Meres MD Electronically signed by Arvilla Meres MD Signature Date/Time: 06/04/2023/12:42:56 PM    Final     CT SCANS  CT CORONARY MORPH W/CTA COR W/SCORE 06/29/2023  Addendum 06/29/2023 10:33 PM ADDENDUM REPORT: 06/29/2023 22:31  EXAM: OVER-READ INTERPRETATION  CT CHEST  The following report is an over-read performed by radiologist Dr. Arnoldo Morale [facility] on 06/29/2023. This over-read does not include interpretation of cardiac or coronary anatomy or pathology. The coronary CTA interpretation by the cardiologist is attached.  COMPARISON:  Chest radiograph dated 05/04/2023.  FINDINGS: No adenopathy noted.  Bibasilar linear atelectasis/scarring. The visualized  lungs are otherwise clear.  No acute osseous pathology.  Degenerative changes of the spine.  IMPRESSION: No acute extracardiac findings.   Electronically Signed By: Elgie Collard M.D. On: 06/29/2023 22:31  Narrative CLINICAL DATA:  Chest pain  EXAM: Cardiac CTA  MEDICATIONS: Sub lingual nitro. 4mg  x 2  TECHNIQUE: The patient was scanned on a Siemens 192 slice scanner. Gantry rotation speed was 250 msecs. Collimation was 0.6 mm. A 100 kV prospective scan was triggered in the ascending thoracic aorta at 35-75% of the R-R interval. Average HR during the scan was 60 bpm. The 3D data set was  interpreted on a dedicated work station using MPR, MIP and VRT modes. A total of 80cc of contrast was used.  FINDINGS: Non-cardiac: See separate report from [facility].  Pulmonary veins drain normally to the left atrium.  Calcium Score: 452 Agatston units.  Coronary Arteries: Right dominant with no anomalies  LM: No plaque or stenosis.  LAD system: Mixed plaque, mild stenosis in the proximal to mid LAD (1-24%).  Circumflex system: Small LCX, no plaque or stenosis.  RCA system: Mixed plaque in the proximal RCA, mild (25-49%) stenosis.  IMPRESSION: 1. Coronary artery calcium score 452 Agatston units. This places the patient in the 73rd percentile for age and gender, suggesting intermediate risk for future cardiac events.  2.  Mild nonobstructive CAD.  [employer] account executive  Electronically Signed: By: Marca Ancona M.D. On: 06/29/2023 21:22          Recent Labs: 05/10/2023: Magnesium 1.7 06/04/2023: TSH 2.840 07/05/2023: B Natriuretic Peptide 17.4 07/06/2023: ALT 10; BUN 15; Creatinine, Ser 1.07; Potassium 3.4; Sodium 134 07/07/2023: Hemoglobin 11.2; Platelets 277.0   Lipid Panel    Component Value Date/Time   CHOL 177 07/06/2023 0039   TRIG 67 07/06/2023 0039   HDL 70 07/06/2023 0039   CHOLHDL 2.5 07/06/2023 0039   VLDL 13 07/06/2023 0039   LDLCALC 94 07/06/2023 0039     ASSESSMENT:    1. SOB (shortness of breath)   2. Primary hypertension   3. Edema of both ankles   4. Coronary artery disease involving native coronary artery of native heart without angina pectoris   5. Nonrheumatic mitral valve regurgitation       PLAN:  In order of problems listed above:  Dyspnea on exertion/PHTN -Shortness of breath related to underlying lung disease with asthma intermittently requiring steroids -Hemoglobin was normal at 12.4 on 05/10/2023 -Coronary CTA with mild nonobstructive CAD -2D echo with normal LV function 06/10/2023 and grade 1 diastolic dysfunction with  mild pulmonary hypertension PASP 44 mmHg likely related to her asthma -TSH normal on 06/04/2023 -Repeat echo 05/2024 to reassess pulmonary hypertension  2.  Hypertension -BP is controlled on exam today continue prescription drug management -With amlodipine 10 mg daily and losartan 50 mg daily with as needed refills  3.  LE edema -Exacerbated recently by steroids for asthma -Continue drug management with Lasix 20 mg daily with as needed refills -Her BNP has been normal -I have personally reviewed and interpreted outside labs performed by patient's PCP which showed serum creatinine 1.07 potassium 3.4  4.  Coronary artery calcifications -Coronary CTA 2024 demonstrated  coronary calcium score 452 which was 73rd percentile for age, gender and sex matched controls as well as mild nonobstructive CAD with <25% stenosis of the proximal to mid LAD, 25 to 49% mixed plaque in the proximal RCA and normal left main and left circumflex. -She denies any anginal symptoms -Continue prescription  drug management with aspirin 81 mg daily and Crestor   5.  Mitral regurgitation -moderate to severe by rec recent echocardiogram -She was supposed to be set up for TEE but then decided she wanted to talk to me further before undergoing the procedure. -I recommended that she proceed with a TEE to try to better define her her severe mitral regurgitation as that is likely contributing to her worsening shortness of breath -Informed Consent   Shared Decision Making/Informed Consent The risks [esophageal damage, perforation (1:10,000 risk), bleeding, pharyngeal hematoma as well as other potential complications associated with conscious sedation including aspiration, arrhythmia, respiratory failure and death], benefits (treatment guidance and diagnostic support) and alternatives of a transesophageal echocardiogram were discussed in detail with Ms. Lemon and she is willing to proceed.     6.  Hyperlipidemia -LDL goal less than  70 -I have personally reviewed and interpreted outside labs performed by patient's PCP which showed LDL 94 and HDL 70 on 07/06/2023 -I have instructed her to try to increase her Crestor to 5 mg every other day -Repeat FLP and ALT in 6 weeks -She will let me know if she has any side effects to the higher dose   Followup: 6 months  Medication Adjustments/Labs and Tests Ordered: Current medicines are reviewed at length with the patient today.  Concerns regarding medicines are outlined above.  Medication changes, Labs and Tests ordered today are listed in the Patient Instructions below.  There are no Patient Instructions on file for this visit.   Signed, Armanda Magic, MD  07/19/2023 2:44 PM    [facility] [address] [place], [place], [place]  [address] Phone: [phone]; Fax: [phone]

## 2023-07-20 NOTE — Telephone Encounter (Signed)
Thanks for the update!   Joel Gallagher, MD [facility]  

## 2023-07-21 DIAGNOSIS — J9601 Acute respiratory failure with hypoxia: Secondary | ICD-10-CM | POA: Diagnosis not present

## 2023-07-21 DIAGNOSIS — J4489 Other specified chronic obstructive pulmonary disease: Secondary | ICD-10-CM | POA: Diagnosis not present

## 2023-07-28 ENCOUNTER — Telehealth: Payer: Self-pay | Admitting: Cardiology

## 2023-07-28 NOTE — Telephone Encounter (Signed)
Returned patient's call, no answer. Left message with no identifiers asking recipient to call our office.

## 2023-07-28 NOTE — Telephone Encounter (Signed)
Pt c/o medication issue:  1. Name of Medication: Wixela INHUB  2. How are you currently taking this medication (dosage and times per day)?   3. Are you having a reaction (difficulty breathing--STAT)?   4. What is your medication issue? Patient wants to know why  this medicine was discontinued

## 2023-08-02 NOTE — Pre-Procedure Instructions (Signed)
Spoke to patient on phone regarding procedure tomorrow.  Instructed to arrive at 8:30 am, NPO after midnight.  Confirmed patient has a ride home and responsible person to stay with patient for 24 hours after the procedure  Take morning meds except Lasix with a sip of water

## 2023-08-03 ENCOUNTER — Ambulatory Visit (HOSPITAL_COMMUNITY)
Admission: RE | Admit: 2023-08-03 | Discharge: 2023-08-03 | Disposition: A | Discharge status: Home / Self Care | Payer: Medicare Other | Source: Home / Self Care | Attending: Cardiovascular Disease | Admitting: Cardiovascular Disease

## 2023-08-03 ENCOUNTER — Ambulatory Visit (HOSPITAL_COMMUNITY): Payer: Medicare Other | Admitting: Anesthesiology

## 2023-08-03 ENCOUNTER — Other Ambulatory Visit: Payer: Self-pay

## 2023-08-03 ENCOUNTER — Ambulatory Visit (HOSPITAL_BASED_OUTPATIENT_CLINIC_OR_DEPARTMENT_OTHER)
Admission: RE | Admit: 2023-08-03 | Discharge: 2023-08-03 | Disposition: A | Discharge status: Home / Self Care | Payer: Medicare Other | Source: Home / Self Care | Attending: Cardiovascular Disease | Admitting: Cardiovascular Disease

## 2023-08-03 ENCOUNTER — Ambulatory Visit (HOSPITAL_BASED_OUTPATIENT_CLINIC_OR_DEPARTMENT_OTHER): Payer: Medicare Other | Admitting: Anesthesiology

## 2023-08-03 ENCOUNTER — Encounter (HOSPITAL_COMMUNITY): Payer: Self-pay | Admitting: Cardiovascular Disease

## 2023-08-03 ENCOUNTER — Encounter (HOSPITAL_COMMUNITY)
Admission: RE | Disposition: A | Discharge status: Home / Self Care | Payer: Self-pay | Source: Home / Self Care | Attending: Cardiovascular Disease

## 2023-08-03 DIAGNOSIS — I2510 Atherosclerotic heart disease of native coronary artery without angina pectoris: Secondary | ICD-10-CM | POA: Insufficient documentation

## 2023-08-03 DIAGNOSIS — E1122 Type 2 diabetes mellitus with diabetic chronic kidney disease: Secondary | ICD-10-CM | POA: Diagnosis not present

## 2023-08-03 DIAGNOSIS — I340 Nonrheumatic mitral (valve) insufficiency: Secondary | ICD-10-CM | POA: Diagnosis not present

## 2023-08-03 DIAGNOSIS — N1831 Chronic kidney disease, stage 3a: Secondary | ICD-10-CM | POA: Insufficient documentation

## 2023-08-03 DIAGNOSIS — J441 Chronic obstructive pulmonary disease with (acute) exacerbation: Secondary | ICD-10-CM

## 2023-08-03 DIAGNOSIS — I083 Combined rheumatic disorders of mitral, aortic and tricuspid valves: Secondary | ICD-10-CM | POA: Insufficient documentation

## 2023-08-03 DIAGNOSIS — E1151 Type 2 diabetes mellitus with diabetic peripheral angiopathy without gangrene: Secondary | ICD-10-CM | POA: Diagnosis not present

## 2023-08-03 DIAGNOSIS — I272 Pulmonary hypertension, unspecified: Secondary | ICD-10-CM | POA: Insufficient documentation

## 2023-08-03 DIAGNOSIS — J9601 Acute respiratory failure with hypoxia: Secondary | ICD-10-CM | POA: Diagnosis not present

## 2023-08-03 DIAGNOSIS — R600 Localized edema: Secondary | ICD-10-CM | POA: Diagnosis not present

## 2023-08-03 DIAGNOSIS — Z79899 Other long term (current) drug therapy: Secondary | ICD-10-CM | POA: Diagnosis not present

## 2023-08-03 DIAGNOSIS — I129 Hypertensive chronic kidney disease with stage 1 through stage 4 chronic kidney disease, or unspecified chronic kidney disease: Secondary | ICD-10-CM | POA: Diagnosis not present

## 2023-08-03 DIAGNOSIS — D571 Sickle-cell disease without crisis: Secondary | ICD-10-CM | POA: Insufficient documentation

## 2023-08-03 DIAGNOSIS — E785 Hyperlipidemia, unspecified: Secondary | ICD-10-CM | POA: Insufficient documentation

## 2023-08-03 DIAGNOSIS — J449 Chronic obstructive pulmonary disease, unspecified: Secondary | ICD-10-CM | POA: Diagnosis not present

## 2023-08-03 DIAGNOSIS — Z7982 Long term (current) use of aspirin: Secondary | ICD-10-CM | POA: Diagnosis not present

## 2023-08-03 DIAGNOSIS — K219 Gastro-esophageal reflux disease without esophagitis: Secondary | ICD-10-CM | POA: Insufficient documentation

## 2023-08-03 DIAGNOSIS — R0609 Other forms of dyspnea: Secondary | ICD-10-CM | POA: Insufficient documentation

## 2023-08-03 HISTORY — PX: TEE WITHOUT CARDIOVERSION: SHX5443

## 2023-08-03 LAB — ECHO TEE

## 2023-08-03 SURGERY — ECHOCARDIOGRAM, TRANSESOPHAGEAL
Anesthesia: Monitor Anesthesia Care

## 2023-08-03 MED ORDER — SODIUM CHLORIDE 0.9 % IV SOLN: INTRAVENOUS | Status: DC

## 2023-08-03 MED ORDER — PROPOFOL 500 MG/50ML IV EMUL
INTRAVENOUS | Status: DC | PRN
Start: 2023-08-03 — End: 2023-08-03
  Administered 2023-08-03: 25 mg via INTRAVENOUS
  Administered 2023-08-03: 50 mg via INTRAVENOUS
  Administered 2023-08-03: 25 mg via INTRAVENOUS
  Administered 2023-08-03: 125 ug/kg/min via INTRAVENOUS

## 2023-08-03 MED ORDER — BUTAMBEN-TETRACAINE-BENZOCAINE 2-2-14 % EX AERO
INHALATION_SPRAY | CUTANEOUS | Status: DC | PRN
  Administered 2023-08-03: 1 via TOPICAL

## 2023-08-03 NOTE — Discharge Instructions (Signed)
TEE  YOU HAD AN CARDIAC PROCEDURE TODAY: Refer to the procedure report and other information in the discharge instructions given to you for any specific questions about what was found during the examination. If this information does not answer your questions, please call [facility] office at [phone] to clarify.   DIET: Your first meal following the procedure should be a light meal and then it is ok to progress to your normal diet. A half-sandwich or bowl of soup is an example of a good first meal. Heavy or fried foods are harder to digest and may make you feel nauseous or bloated. Drink plenty of fluids but you should avoid alcoholic beverages for 24 hours. If you had a esophageal dilation, please see attached instructions for diet.   ACTIVITY: Your care partner should take you home directly after the procedure. You should plan to take it easy, moving slowly for the rest of the day. You can resume normal activity the day after the procedure however YOU SHOULD NOT DRIVE, use power tools, machinery or perform tasks that involve climbing or major physical exertion for 24 hours (because of the sedation medicines used during the test).   SYMPTOMS TO REPORT IMMEDIATELY: A cardiologist can be reached at any hour. Please call [phone] for any of the following symptoms:  Vomiting of blood or coffee ground material  New, significant abdominal pain  New, significant chest pain or pain under the shoulder blades  Painful or persistently difficult swallowing  New shortness of breath  Black, tarry-looking or red, bloody stools  FOLLOW UP:  Please also call with any specific questions about appointments or follow up tests.   

## 2023-08-03 NOTE — Transfer of Care (Signed)
Immediate Anesthesia Transfer of Care Note  Patient: Mary Chase  Procedure(s) Performed: TRANSESOPHAGEAL ECHOCARDIOGRAM  Patient Location: Cath Lab  Anesthesia Type:MAC  Level of Consciousness: drowsy and patient cooperative  Airway & Oxygen Therapy: Patient Spontanous Breathing and Patient connected to nasal cannula oxygen  Post-op Assessment: Report given to RN and Post -op Vital signs reviewed and stable  Post vital signs: Reviewed and stable  Last Vitals:  Vitals Value Taken Time  BP    Temp    Pulse    Resp    SpO2      Last Pain:  Vitals:   08/03/23 0915  TempSrc:   PainSc: 0-No pain         Complications: No notable events documented.

## 2023-08-03 NOTE — Anesthesia Preprocedure Evaluation (Signed)
Anesthesia Evaluation  Patient identified by MRN, date of birth, ID band Patient awake    Reviewed: Allergy & Precautions, NPO status , Patient's Chart, lab work & pertinent test results  Airway Mallampati: II  TM Distance: >3 FB Neck ROM: Full    Dental no notable dental hx.    Pulmonary shortness of breath, asthma , pneumonia, COPD, Recent URI , Residual Cough   Pulmonary exam normal breath sounds clear to auscultation       Cardiovascular hypertension, Pt. on medications pulmonary hypertension+ CAD and + Peripheral Vascular Disease  Normal cardiovascular exam+ Valvular Problems/Murmurs AS and MR  Rhythm:Regular Rate:Normal  Echo 6.2024  1. Left ventricular ejection fraction, by estimation, is 60 to 65%. The left ventricle has normal function. The left ventricle has no regional wall motion abnormalities. Left ventricular diastolic parameters are consistent with Grade I diastolic dysfunction (impaired relaxation).   2. Right ventricular systolic function is normal. The right ventricular size is normal. There is mildly elevated pulmonary artery systolic pressure. The estimated right ventricular systolic pressure is 44.5 mmHg.   3. Left atrial size was moderately dilated.   4. Right atrial size was moderately dilated.   5. The mitral valve is normal in structure. Moderate to severe mitral valve regurgitation. No evidence of mitral stenosis.   6. Tricuspid valve regurgitation is moderate.   7. The aortic valve is tricuspid. There is mild calcification of the aortic valve. Aortic valve regurgitation is trivial. Mild aortic valve stenosis. Aortic valve area, by VTI measures 1.86 cm. Aortic valve mean gradient measures 9.2 mmHg. Aortic valve  Vmax measures 2.07 m/s.   8. Aortic dilatation noted. There is mild dilatation of the ascending aorta, measuring 40 mm.   9. The inferior vena cava is dilated in size with >50% respiratory variability,  suggesting right atrial pressure of 8 mmHg.      Neuro/Psych  Headaches    GI/Hepatic Neg liver ROS,GERD  Controlled,,  Endo/Other  negative endocrine ROS    Renal/GU Renal disease     Musculoskeletal  (+) Arthritis ,    Abdominal   Peds  Hematology  (+) Blood dyscrasia, Sickle cell anemia   Anesthesia Other Findings   Reproductive/Obstetrics                             Anesthesia Physical Anesthesia Plan  ASA: 4  Anesthesia Plan: MAC   Post-op Pain Management:    Induction: Intravenous  PONV Risk Score and Plan: 2 and Propofol infusion, TIVA and Treatment may vary due to age or medical condition  Airway Management Planned: Natural Airway  Additional Equipment:   Intra-op Plan:   Post-operative Plan:   Informed Consent: I have reviewed the patients History and Physical, chart, labs and discussed the procedure including the risks, benefits and alternatives for the proposed anesthesia with the patient or authorized representative who has indicated his/her understanding and acceptance.     Dental advisory given  Plan Discussed with: CRNA  Anesthesia Plan Comments:         Anesthesia Quick Evaluation

## 2023-08-03 NOTE — Interval H&P Note (Signed)
History and Physical Interval Note:  08/03/2023 9:18 AM  Mary Chase  has presented today for surgery, with the diagnosis of severe mitral regurgitation.  The various methods of treatment have been discussed with the patient and family. After consideration of risks, benefits and other options for treatment, the patient has consented to  Procedure(s): TRANSESOPHAGEAL ECHOCARDIOGRAM (N/A) as a surgical intervention.  The patient's history has been reviewed, patient examined, no change in status, stable for surgery.  I have reviewed the patient's chart and labs.  Questions were answered to the patient's satisfaction.    TEE for MR. NPO.  Gerri Spore T. Flora Lipps, MD, [facility]  [facility]  [address] [place], [place] [address] [phone]  9:19 AM

## 2023-08-03 NOTE — CV Procedure (Signed)
    TRANSESOPHAGEAL ECHOCARDIOGRAM   NAME:  Mary Chase    MRN: [id number] DOB:  23-Apr-1935    ADMIT DATE: 08/03/2023  INDICATIONS: Mitral regurgitation   PROCEDURE:   Informed consent was obtained prior to the procedure. The risks, benefits and alternatives for the procedure were discussed and the patient comprehended these risks.  Risks include, but are not limited to, cough, sore throat, vomiting, nausea, somnolence, esophageal and stomach trauma or perforation, bleeding, low blood pressure, aspiration, pneumonia, infection, trauma to the teeth and death.    Procedural time out performed. The oropharynx was anesthetized with topical 1% benzocaine.    Anesthesia was administered by Dr. Twanna Hy.  The patient was administered 250 mg of propofol and 0 mg of lidocaine to achieve and maintain moderate conscious sedation.  The patient's heart rate, blood pressure, and oxygen saturation are monitored continuously during the procedure. The period of conscious sedation is 13 minutes, of which I was present face-to-face 100% of this time.   The transesophageal probe was inserted in the esophagus and stomach without difficulty and multiple views were obtained.   COMPLICATIONS:    There were no immediate complications.  KEY FINDINGS:  Mild to moderate MR. 3D VCA 0.29 cm2. Systolic dominant flow in all 4 pulmonary veins Normal LV/RV function.  Full report to follow. Further management per primary team.   Gerri Spore T. Flora Lipps, MD, [facility]  [facility]  [address], [address] [place], [place] [address] [phone]  11:21 AM

## 2023-08-04 ENCOUNTER — Encounter (HOSPITAL_COMMUNITY): Payer: Self-pay | Admitting: Cardiovascular Disease

## 2023-08-04 NOTE — Anesthesia Postprocedure Evaluation (Signed)
Anesthesia Post Note  Patient: Mary Chase  Procedure(s) Performed: TRANSESOPHAGEAL ECHOCARDIOGRAM     Patient location during evaluation: Cath Lab Anesthesia Type: MAC Level of consciousness: awake and alert Pain management: pain level controlled Vital Signs Assessment: post-procedure vital signs reviewed and stable Respiratory status: spontaneous breathing Cardiovascular status: stable Anesthetic complications: no   No notable events documented.  Last Vitals:  Vitals:   08/03/23 1135 08/03/23 1145  BP: (!) 140/84 (!) 147/81  Pulse: 86 84  Resp: 13 20  Temp:    SpO2: 96% 97%    Last Pain:  Vitals:   08/03/23 1145  TempSrc:   PainSc: 0-No pain                 Lewie Loron

## 2023-08-09 NOTE — Progress Notes (Unsigned)
Cardiology Office Note:  .   Date:  08/10/2023  ID:  Mary Chase, DOB March 14, 1935, MRN [id number] PCP: Lorenda Ishihara, MD  [facility] Providers Cardiologist:  Armanda Magic, MD    History of Present Illness: .   Mary Chase is a 88 y.o. 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 She is followed by Dr. Mayford Knife, she presents today for follow up.     In 04/2023 she was admitted for acute hypoxic respiratory failure secondary to severe asthma exacerbation caused by parainfluenza viral infection.  She was subsequently referred to cardiology to assess for cardiac etiology for her shortness of breath.  Coronary CTA was done 06/29/2023 which demonstrated a coronary calcium score of 452 which was 73rd percentile for age, gender and sex matched controls as well as mild nonobstructive CAD with less than 25% stenosis of the proximal to mid LAD, 25 to 49% mixed plaque in the proximal RCA and normal left main and left circumflex.  2D echo on 06/04/2023 showed EF 60 to 65% with grade 1 diastolic dysfunction mild pulmonary hypertension with PASP 44.5 mmHg, moderate biatrial enlargement, moderate to severe MR, moderate TR and mild aortic valve stenosis with a mean aortic valve gradient of 9 mmHg.  She also had a mildly dilated ascending aorta at 40 mm. It was recommended that she undergo TEE for further evaluation of mitral valve regurgitation.   On 08/03/23 she underwent TEE that showed mild to moderate MR, systolic dominant flow in all 4 pulmonary veins. Normal LV/RV function. Aortic valve sclerosis without evidence of aortic stenosis.      She presents today for follow up. She reports she is doing well overall, her shortness of breath has significantly improved, she denies chest pain. Reports she did well following TEE, no concerns. Discussed Crestor and what it is used for as well as  reviewed her TEE results and coronary CTA results. She reports decreased exercise do to mobility difficulties from arthritis is both knees but she has been working to increase, tolerating well aside from knee pain. Notes some mild lower extremity edema, possibly related to arthritis.   ROS: Today she denies chest pain, shortness of breath, fatigue, palpitations, melena, hematuria, hemoptysis, diaphoresis, weakness, presyncope, syncope, orthopnea, and PND.   Studies Reviewed: .       Cardiac Studies & Procedures       ECHOCARDIOGRAM  ECHOCARDIOGRAM COMPLETE 06/04/2023  Narrative ECHOCARDIOGRAM REPORT    Patient Name:   Mary Chase Date of Exam: 06/04/2023 Medical Rec #:  [id number]        Height:       59.5 in Accession #:    [id number]       Weight:       174.2 lb Date of Birth:  Jan 06, 1935       BSA:          1.749 m Patient Age:    87 years         BP:           140/78 mmHg Patient Gender: F                HR:           81 bpm. Exam Location:  [address]  Procedure: 2D Echo, 3D Echo, Cardiac Doppler and Color Doppler  Indications:    R06.00 Dyspnea  History:  Patient has no prior history of Echocardiogram examinations. PAD and COPD, Signs/Symptoms:Shortness of Breath and Fever; Risk Factors:Hypertension and Dyslipidemia. Sickle Cell Trait, Chronic Kidney Disease.  Sonographer:    Farrel Conners RDCS Referring Phys: Cornelious Bryant TURNER  IMPRESSIONS   1. Left ventricular ejection fraction, by estimation, is 60 to 65%. The left ventricle has normal function. The left ventricle has no regional wall motion abnormalities. Left ventricular diastolic parameters are consistent with Grade I diastolic dysfunction (impaired relaxation). 2. Right ventricular systolic function is normal. The right ventricular size is normal. There is mildly elevated pulmonary artery systolic pressure. The estimated right ventricular systolic pressure is 44.5 mmHg. 3. Left atrial size was  moderately dilated. 4. Right atrial size was moderately dilated. 5. The mitral valve is normal in structure. Moderate to severe mitral valve regurgitation. No evidence of mitral stenosis. 6. Tricuspid valve regurgitation is moderate. 7. The aortic valve is tricuspid. There is mild calcification of the aortic valve. Aortic valve regurgitation is trivial. Mild aortic valve stenosis. Aortic valve area, by VTI measures 1.86 cm. Aortic valve mean gradient measures 9.2 mmHg. Aortic valve Vmax measures 2.07 m/s. 8. Aortic dilatation noted. There is mild dilatation of the ascending aorta, measuring 40 mm. 9. The inferior vena cava is dilated in size with >50% respiratory variability, suggesting right atrial pressure of 8 mmHg.  FINDINGS Left Ventricle: Left ventricular ejection fraction, by estimation, is 60 to 65%. The left ventricle has normal function. The left ventricle has no regional wall motion abnormalities. The left ventricular internal cavity size was normal in size. There is no left ventricular hypertrophy. Left ventricular diastolic parameters are consistent with Grade I diastolic dysfunction (impaired relaxation).  Right Ventricle: The right ventricular size is normal. No increase in right ventricular wall thickness. Right ventricular systolic function is normal. There is mildly elevated pulmonary artery systolic pressure. The tricuspid regurgitant velocity is 3.02 m/s, and with an assumed right atrial pressure of 8 mmHg, the estimated right ventricular systolic pressure is 44.5 mmHg.  Left Atrium: Left atrial size was moderately dilated.  Right Atrium: Right atrial size was moderately dilated.  Pericardium: There is no evidence of pericardial effusion.  Mitral Valve: The mitral valve is normal in structure. Moderate to severe mitral valve regurgitation, with centrally-directed jet. No evidence of mitral valve stenosis.  Tricuspid Valve: The tricuspid valve is normal in structure.  Tricuspid valve regurgitation is moderate . No evidence of tricuspid stenosis.  Aortic Valve: The aortic valve is tricuspid. There is mild calcification of the aortic valve. Aortic valve regurgitation is trivial. Aortic regurgitation PHT measures 298 msec. Mild aortic stenosis is present. Aortic valve mean gradient measures 9.2 mmHg. Aortic valve peak gradient measures 17.2 mmHg. Aortic valve area, by VTI measures 1.86 cm.  Pulmonic Valve: The pulmonic valve was normal in structure. Pulmonic valve regurgitation is trivial. No evidence of pulmonic stenosis.  Aorta: Aortic dilatation noted. There is mild dilatation of the ascending aorta, measuring 40 mm.  Venous: The inferior vena cava is dilated in size with greater than 50% respiratory variability, suggesting right atrial pressure of 8 mmHg.  IAS/Shunts: No atrial level shunt detected by color flow Doppler.   LEFT VENTRICLE PLAX 2D LVIDd:         3.90 cm   Diastology LVIDs:         2.20 cm   LV e' medial:    7.83 cm/s LV PW:         1.00 cm   LV E/e'  medial:  14.8 LV IVS:        0.90 cm   LV e' lateral:   6.96 cm/s LVOT diam:     2.20 cm   LV E/e' lateral: 16.6 LV SV:         73 LV SV Index:   42 LVOT Area:     3.80 cm  3D Volume EF: 3D EF:        65 % LV EDV:       113 ml LV ESV:       40 ml LV SV:        73 ml  RIGHT VENTRICLE RV Basal diam:  3.40 cm RV S prime:     14.90 cm/s TAPSE (M-mode): 1.6 cm RVSP:           39.5 mmHg  LEFT ATRIUM             Index        RIGHT ATRIUM           Index LA diam:        4.00 cm 2.29 cm/m   RA Pressure: 3.00 mmHg LA Vol (A2C):   61.9 ml 35.39 ml/m  RA Area:     20.80 cm LA Vol (A4C):   52.3 ml 29.90 ml/m  RA Volume:   64.00 ml  36.59 ml/m LA Biplane Vol: 58.1 ml 33.21 ml/m AORTIC VALVE AV Area (Vmax):    1.77 cm AV Area (Vmean):   1.77 cm AV Area (VTI):     1.86 cm AV Vmax:           207.25 cm/s AV Vmean:          139.750 cm/s AV VTI:            0.391 m AV Peak Grad:       17.2 mmHg AV Mean Grad:      9.2 mmHg LVOT Vmax:         96.70 cm/s LVOT Vmean:        65.000 cm/s LVOT VTI:          0.192 m LVOT/AV VTI ratio: 0.49 AI PHT:            298 msec  AORTA Ao Root diam: 3.40 cm Ao Asc diam:  3.80 cm  MITRAL VALVE                TRICUSPID VALVE MV Area (PHT): cm          TR Peak grad:   36.5 mmHg MV Decel Time: 192 msec     TR Vmax:        302.00 cm/s MR Peak grad: 123.2 mmHg    Estimated RAP:  3.00 mmHg MR Mean grad: 67.0 mmHg     RVSP:           39.5 mmHg MR Vmax:      555.00 cm/s MR Vmean:     378.0 cm/s    SHUNTS MV E velocity: 115.50 cm/s  Systemic VTI:  0.19 m MV A velocity: 127.00 cm/s  Systemic Diam: 2.20 cm MV E/A ratio:  0.91  Arvilla Meres MD Electronically signed by Arvilla Meres MD Signature Date/Time: 06/04/2023/12:42:56 PM    Final   TEE  ECHO TEE 08/03/2023  Narrative TRANSESOPHOGEAL ECHO REPORT    Patient Name:   Mary Chase Date of Exam: 08/03/2023 Medical Rec #:  [id number]        Height:  60.0 in Accession #:    [id number]       Weight:       167.0 lb Date of Birth:  06-11-1935       BSA:          1.729 m Patient Age:    87 years         BP:           155/88 mmHg Patient Gender: F                HR:           107 bpm. Exam Location:  Inpatient  Procedure: Transesophageal Echo, 3D Echo, Cardiac Doppler and Color Doppler  Indications:    I34.0 Nonrheumatic mitral (valve) insufficiency  History:        Patient has prior history of Echocardiogram examinations, most recent 06/04/2023. CAD, COPD and Pulmonary HTN, Mitral Valve Disease and Aortic Valve Disease; Risk Factors:Hypertension.  Sonographer:    Darlys Gales Referring Phys: [id number] Ronnald Ramp O'NEAL  PROCEDURE: After discussion of the risks and benefits of a TEE, an informed consent was obtained from the patient. TEE procedure time was 13 minutes. The transesophogeal probe was passed without difficulty through the esophogus of the patient.  Sedation performed by different physician. The patient was monitored while under deep sedation. Anesthestetic sedation was provided intravenously by Anesthesiology: 250mg  of Propofol. Image quality was excellent. The patient's vital signs; including heart rate, blood pressure, and oxygen saturation; remained stable throughout the procedure. The patient developed no complications during the procedure.  IMPRESSIONS   1. Mild to moderate mitral regurgitation. 3D VCA 0.27 cm2, consistent with moderate MR. Systolic dominant flow in all 4 pulmonary veins. The mitral valve is grossly normal. Mild to moderate mitral valve regurgitation. No evidence of mitral stenosis. 2. Left ventricular ejection fraction, by estimation, is 60 to 65%. The left ventricle has normal function. 3. Right ventricular systolic function is normal. The right ventricular size is normal. 4. No left atrial/left atrial appendage thrombus was detected. 5. The aortic valve is tricuspid. There is moderate calcification of the aortic valve. There is moderate thickening of the aortic valve. Aortic valve regurgitation is not visualized. Aortic valve sclerosis/calcification is present, without any evidence of aortic stenosis.  FINDINGS Left Ventricle: Left ventricular ejection fraction, by estimation, is 60 to 65%. The left ventricle has normal function. The left ventricular internal cavity size was normal in size.  Right Ventricle: The right ventricular size is normal. No increase in right ventricular wall thickness. Right ventricular systolic function is normal.  Left Atrium: Left atrial size was normal in size. No left atrial/left atrial appendage thrombus was detected.  Right Atrium: Right atrial size was normal in size.  Pericardium: Trivial pericardial effusion is present.  Mitral Valve: Mild to moderate mitral regurgitation. 3D VCA 0.27 cm2, consistent with moderate MR. Systolic dominant flow in all 4 pulmonary veins. The mitral  valve is grossly normal. Mild to moderate mitral valve regurgitation. No evidence of mitral valve stenosis.  Tricuspid Valve: The tricuspid valve is grossly normal. Tricuspid valve regurgitation is mild . No evidence of tricuspid stenosis.  Aortic Valve: The aortic valve is tricuspid. There is moderate calcification of the aortic valve. There is moderate thickening of the aortic valve. Aortic valve regurgitation is not visualized. Aortic valve sclerosis/calcification is present, without any evidence of aortic stenosis.  Pulmonic Valve: The pulmonic valve was grossly normal. Pulmonic valve regurgitation is trivial. No evidence of pulmonic stenosis.  Aorta: The aortic root and ascending aorta are structurally normal, with no evidence of dilitation. There is minimal (Grade I) layered plaque involving the descending aorta.  Venous: The left upper pulmonary vein, left lower pulmonary vein, right upper pulmonary vein and right lower pulmonary vein are normal.  IAS/Shunts: The atrial septum is grossly normal.   AORTIC VALVE LVOT Vmax:   172.00 cm/s LVOT Vmean:  104.000 cm/s LVOT VTI:    0.274 m  AORTA Ao Root diam: 3.08 cm Ao Asc diam:  3.76 cm   SHUNTS Systemic VTI: 0.27 m  Lennie Odor MD Electronically signed by Lennie Odor MD Signature Date/Time: 08/03/2023/12:35:54 PM    Final    CT SCANS  CT CORONARY MORPH W/CTA COR W/SCORE 06/29/2023  Addendum 06/29/2023 10:33 PM ADDENDUM REPORT: 06/29/2023 22:31  EXAM: OVER-READ INTERPRETATION  CT CHEST  The following report is an over-read performed by radiologist Dr. Arnoldo Morale [facility] on 06/29/2023. This over-read does not include interpretation of cardiac or coronary anatomy or pathology. The coronary CTA interpretation by the cardiologist is attached.  COMPARISON:  Chest radiograph dated 05/04/2023.  FINDINGS: No adenopathy noted.  Bibasilar linear atelectasis/scarring. The visualized lungs  are otherwise clear.  No acute osseous pathology.  Degenerative changes of the spine.  IMPRESSION: No acute extracardiac findings.   Electronically Signed By: Elgie Collard M.D. On: 06/29/2023 22:31  Narrative CLINICAL DATA:  Chest pain  EXAM: Cardiac CTA  MEDICATIONS: Sub lingual nitro. 4mg  x 2  TECHNIQUE: The patient was scanned on a Siemens 192 slice scanner. Gantry rotation speed was 250 msecs. Collimation was 0.6 mm. A 100 kV prospective scan was triggered in the ascending thoracic aorta at 35-75% of the R-R interval. Average HR during the scan was 60 bpm. The 3D data set was interpreted on a dedicated work station using MPR, MIP and VRT modes. A total of 80cc of contrast was used.  FINDINGS: Non-cardiac: See separate report from [facility].  Pulmonary veins drain normally to the left atrium.  Calcium Score: 452 Agatston units.  Coronary Arteries: Right dominant with no anomalies  LM: No plaque or stenosis.  LAD system: Mixed plaque, mild stenosis in the proximal to mid LAD (1-24%).  Circumflex system: Small LCX, no plaque or stenosis.  RCA system: Mixed plaque in the proximal RCA, mild (25-49%) stenosis.  IMPRESSION: 1. Coronary artery calcium score 452 Agatston units. This places the patient in the 73rd percentile for age and gender, suggesting intermediate risk for future cardiac events.  2.  Mild nonobstructive CAD.  [employer] account executive  Electronically Signed: By: Marca Ancona M.D. On: 06/29/2023 21:22           Physical Exam:   VS:  BP 124/76   Pulse 92   Ht 5' (1.524 m)   Wt 167 lb 12.8 oz (76.1 kg)   SpO2 97%   BMI 32.77 kg/m    Wt Readings from Last 3 Encounters:  08/10/23 167 lb 12.8 oz (76.1 kg)  08/03/23 167 lb (75.8 kg)  07/19/23 166 lb 12.8 oz (75.7 kg)    GEN: Well nourished, well developed in no acute distress NECK: No JVD; No carotid bruits CARDIAC: RRR, 2/6 systolic murmur at LLSB, no rubs,  gallops RESPIRATORY:  Clear to auscultation without rales, wheezing or rhonchi  ABDOMEN: Soft, non-tender, non-distended EXTREMITIES:  No edema; No deformity   ASSESSMENT AND PLAN: .    DOE/Pulmonary HTN: Shortness of breath related to underlying lung disease with asthma intermittently  requiring steroids.  Coronary CTA with mild nonobstructive CAD in 06/2023.  2D echo with normal LV function in 05/2023 with grade 1 diastolic dysfunction with mild pulmonary hypertension, likely related to her asthma. Today she reports significant improvement in shortness of breath. Followed by pulmonary. Plans for repeat echo in 05/2024 to reassess pulmonary hypertension, to be ordered on closer follow up.   Hypertension: Blood pressure well controlled today at 124/76. Continue amlodipine and losartan.   Nonobstructive CAD: Coronary CTA in 06/2023 demonstrated coronary calcium score 452 which was 73rd percentile for age, gender and sex matched controls.  Indicated mild nonobstructive CAD with less than 25% stenosis of the proximal to mid LAD, 25 to 49% mixed plaque in the proximal RCA and normal left main and left circumflex. Stable with no anginal symptoms. No indication for ischemic evaluation.  Continue aspirin 81mg  daily and Crestor 5 mg every other day.  Mitral regurgitation: Underwent TEE on 08/03/2023 that indicated mild to moderate MR, 3D VCA 0.29 cm2. Systolic dominant flow in all 4 pulmonary veins and normal LV/RV function. Reports today her shortness of breath has significantly improved. Plans for repeat echo in 05/2024 to reassess pulmonary hypertension, to be ordered on closer follow up.   Hyperlipidemia: Last lipid profile on 07/06/2023 indicated LDL of 94 and HDL of 70.  Her LDL goal is less than 70.  On last visit on 07/19/2023 Dr. Mayford Knife increased her Crestor to 5 mg every other day to have repeat FLP and ALT. Lipid panel and LFTs ordered by Dr. Mayford Knife, due in 3-4 weeks. Crestor refill sent to her home  pharmacy.   Ascending aorta dilatation: On echo 06/04/23 there was mild dilatation noted at 40 mm. TEE 08/03/23 showed structurally normal aortic root and ascending aorta, no evidence of dilatation. Repeat echo in 2025 as above.   Lower extremity edema: She reports ongoing lower extremity edema that worsens throughout the day, notes some improvement with Lasix. She questions if possibly related to her arthritis and knee pain. Reviewed her echocardiogram results with her. Recommended compression stockings and lower extremity elevation. Continue lasix 20mg  every other day.    CKD stage 3a: Most recent creatinine 1.04, eGFR 53. Reviewed prior levels with her, monitored by PCP.        Dispo: Follow up with Dr. Mayford Knife in 6 months.   Signed, Rip Harbour, NP

## 2023-08-10 ENCOUNTER — Encounter: Payer: Self-pay | Admitting: Cardiology

## 2023-08-10 ENCOUNTER — Ambulatory Visit: Payer: Medicare Other | Attending: Physician Assistant | Admitting: Cardiology

## 2023-08-10 VITALS — BP 124/76 | HR 92 | Ht 60.0 in | Wt 167.8 lb

## 2023-08-10 DIAGNOSIS — M25471 Effusion, right ankle: Secondary | ICD-10-CM

## 2023-08-10 DIAGNOSIS — R0602 Shortness of breath: Secondary | ICD-10-CM | POA: Diagnosis not present

## 2023-08-10 DIAGNOSIS — I77810 Thoracic aortic ectasia: Secondary | ICD-10-CM | POA: Diagnosis not present

## 2023-08-10 DIAGNOSIS — I2510 Atherosclerotic heart disease of native coronary artery without angina pectoris: Secondary | ICD-10-CM | POA: Diagnosis not present

## 2023-08-10 DIAGNOSIS — N1831 Chronic kidney disease, stage 3a: Secondary | ICD-10-CM

## 2023-08-10 DIAGNOSIS — I340 Nonrheumatic mitral (valve) insufficiency: Secondary | ICD-10-CM | POA: Diagnosis not present

## 2023-08-10 DIAGNOSIS — M25472 Effusion, left ankle: Secondary | ICD-10-CM | POA: Diagnosis not present

## 2023-08-10 DIAGNOSIS — I10 Essential (primary) hypertension: Secondary | ICD-10-CM | POA: Diagnosis not present

## 2023-08-10 DIAGNOSIS — Z79899 Other long term (current) drug therapy: Secondary | ICD-10-CM | POA: Diagnosis not present

## 2023-08-10 MED ORDER — ROSUVASTATIN CALCIUM 5 MG PO TABS
5.0000 mg | ORAL_TABLET | ORAL | 3 refills | Status: DC
Start: 2023-08-10 — End: 2023-08-11

## 2023-08-10 NOTE — Patient Instructions (Signed)
Medication Instructions:  Your physician recommends that you continue on your current medications as directed. Please refer to the Current Medication list given to you today. *If you need a refill on your cardiac medications before your next appointment, please call your pharmacy*   Lab Work: None ordered If you have labs (blood work) drawn today and your tests are completely normal, you will receive your results only by: [place] (if you have MyChart) OR A paper copy in the mail If you have any lab test that is abnormal or we need to change your treatment, we will call you to review the results.   Testing/Procedures: None ordered   Follow-Up: At [facility], you and your health needs are our priority.  As part of our continuing mission to provide you with exceptional heart care, we have created designated Provider Care Teams.  These Care Teams include your primary Cardiologist (physician) and Advanced Practice Providers (APPs -  Physician Assistants and Nurse Practitioners) who all work together to provide you with the care you need, when you need it.  We recommend signing up for the patient portal called "MyChart".  Sign up information is provided on this After Visit Summary.  MyChart is used to connect with patients for Virtual Visits (Telemedicine).  Patients are able to view lab/test results, encounter notes, upcoming appointments, etc.  Non-urgent messages can be sent to your provider as well.   To learn more about what you can do with MyChart, go to [email or web address].    Your next appointment:   6 month(s)  Provider:   Fransico Him, MD    Other Instructions

## 2023-08-11 ENCOUNTER — Telehealth: Payer: Self-pay | Admitting: Cardiology

## 2023-08-11 DIAGNOSIS — Z79899 Other long term (current) drug therapy: Secondary | ICD-10-CM

## 2023-08-11 MED ORDER — ROSUVASTATIN CALCIUM 5 MG PO TABS
5.0000 mg | ORAL_TABLET | ORAL | 2 refills | Status: DC
Start: 2023-08-11 — End: 2024-04-04

## 2023-08-11 NOTE — Telephone Encounter (Signed)
Pt c/o medication issue:  1. Name of Medication:   rosuvastatin (CRESTOR) 5 MG tablet    2. How are you currently taking this medication (dosage and times per day)?   3. Are you having a reaction (difficulty breathing--STAT)?   4. What is your medication issue? Needs new prescription sent in to pharmacy due to change in how she takes it per last visit.

## 2023-08-11 NOTE — Telephone Encounter (Signed)
As copied below from [facility] NP OV note on 8/20 with the pt.  Crestor was changed to 5 mg every other day with repeat lipids/ALT on 9/9.  Pt states the crestor 5 mg EOD was never sent to her local pharmacy [facility].   Informed the pt that it looks like it was sent to them, but with instruction to "fill later."  Informed the pt that I will resend this medication to [facility] for her to fill. Informed her she can call them today and have them fill this for her.    Advised her to proceed with scheduled lab appt on 9/9.  Pt verbalized understanding and agrees with this plan.      Hyperlipidemia: Last lipid profile on 07/06/2023 indicated LDL of 94 and HDL of 70.  Her LDL goal is less than 70.  On last visit on 07/19/2023 Dr. Mayford Knife increased her Crestor to 5 mg every other day to have repeat FLP and ALT. Lipid panel and LFTs ordered by Dr. Mayford Knife, due in 3-4 weeks. Crestor refill sent to her home pharmacy.

## 2023-08-30 ENCOUNTER — Ambulatory Visit: Payer: Medicare Other | Attending: Cardiology

## 2023-08-30 DIAGNOSIS — Z79899 Other long term (current) drug therapy: Secondary | ICD-10-CM | POA: Diagnosis not present

## 2023-08-31 LAB — LIPID PANEL
Chol/HDL Ratio: 2.1 ratio (ref 0.0–4.4)
Cholesterol, Total: 156 mg/dL (ref 100–199)
HDL: 73 mg/dL (ref >39)
LDL Chol Calc (NIH): 72 mg/dL (ref 0–99)
Triglycerides: 50 mg/dL (ref 0–149)
VLDL Cholesterol Cal: 11 mg/dL (ref 5–40)

## 2023-08-31 LAB — ALT: ALT: 6 IU/L (ref 0–32)

## 2023-09-06 DIAGNOSIS — Z0000 Encounter for general adult medical examination without abnormal findings: Secondary | ICD-10-CM | POA: Diagnosis not present

## 2023-09-06 DIAGNOSIS — Z9989 Dependence on other enabling machines and devices: Secondary | ICD-10-CM | POA: Diagnosis not present

## 2023-09-06 DIAGNOSIS — E1122 Type 2 diabetes mellitus with diabetic chronic kidney disease: Secondary | ICD-10-CM | POA: Diagnosis not present

## 2023-09-06 DIAGNOSIS — Z993 Dependence on wheelchair: Secondary | ICD-10-CM | POA: Diagnosis not present

## 2023-09-06 DIAGNOSIS — J4489 Other specified chronic obstructive pulmonary disease: Secondary | ICD-10-CM | POA: Diagnosis not present

## 2023-09-06 DIAGNOSIS — I10 Essential (primary) hypertension: Secondary | ICD-10-CM | POA: Diagnosis not present

## 2023-09-06 DIAGNOSIS — I70213 Atherosclerosis of native arteries of extremities with intermittent claudication, bilateral legs: Secondary | ICD-10-CM | POA: Diagnosis not present

## 2023-09-06 DIAGNOSIS — H35033 Hypertensive retinopathy, bilateral: Secondary | ICD-10-CM | POA: Diagnosis not present

## 2023-09-06 DIAGNOSIS — E1151 Type 2 diabetes mellitus with diabetic peripheral angiopathy without gangrene: Secondary | ICD-10-CM | POA: Diagnosis not present

## 2023-09-06 DIAGNOSIS — R0600 Dyspnea, unspecified: Secondary | ICD-10-CM | POA: Diagnosis not present

## 2023-09-13 ENCOUNTER — Telehealth: Payer: Self-pay | Admitting: Allergy & Immunology

## 2023-09-13 NOTE — Telephone Encounter (Signed)
Called patient - DOB verified - advised office visit will be needed for Prednisone refill.  Patient last seen: 05/27/23 return in 4 months  Patient verbalized understanding, will call back to schedule sooner appt.

## 2023-09-13 NOTE — Telephone Encounter (Addendum)
Patient would like a call back regarding medication for Prednisone. She states she is taking her breathing treatments, and rescue inhaler since the asthma attack she had 4 days ago. Patient states that she really needs the prednisone until her visit on 09/28/23.  Please call back @ [phone].

## 2023-09-14 NOTE — Telephone Encounter (Signed)
If she is needing prednisone, she needs to be seen sooner. We do not prefer to keep patients on chronic prednisone. Ok to see NP.   Malachi Bonds, MD [facility]

## 2023-09-28 ENCOUNTER — Other Ambulatory Visit: Payer: Self-pay

## 2023-09-28 ENCOUNTER — Ambulatory Visit: Payer: Medicare Other | Admitting: Allergy & Immunology

## 2023-09-28 ENCOUNTER — Encounter: Payer: Self-pay | Admitting: Allergy & Immunology

## 2023-09-28 VITALS — BP 120/68 | HR 86 | Temp 98.4°F | Resp 18 | Wt 166.2 lb

## 2023-09-28 DIAGNOSIS — J330 Polyp of nasal cavity: Secondary | ICD-10-CM

## 2023-09-28 DIAGNOSIS — J4540 Moderate persistent asthma, uncomplicated: Secondary | ICD-10-CM | POA: Diagnosis not present

## 2023-09-28 DIAGNOSIS — J310 Chronic rhinitis: Secondary | ICD-10-CM | POA: Diagnosis not present

## 2023-09-28 MED ORDER — PREDNISONE 10 MG PO TABS: ORAL_TABLET | ORAL | 0 refills | Status: DC

## 2023-09-28 MED ORDER — BREZTRI AEROSPHERE 160-9-4.8 MCG/ACT IN AERO: 2.0000 | INHALATION_SPRAY | Freq: Two times a day (BID) | RESPIRATORY_TRACT | 5 refills | Status: DC

## 2023-09-28 NOTE — Progress Notes (Signed)
FOLLOW UP  Date of Service/Encounter:  09/28/23   Assessment:   Moderate persistent asthma, uncomplicated - with three rounds of prednisone in less than 6 months  Strongly consider Dupixent (have discussed with patient and she is worried about the side effects)   Chronic rhinitis (grasses, weeds, ragweed, trees, molds, dust mites, roach, cat, dog) - s/p years of allergen immunotherapy   Elevated IgE - with normal SPEP and UPEP    Polyp of left nasal cavity - much better with the regular use of fluticasone nasal spray  Plan/Recommendations:   1. Moderate persistent asthma, uncomplicated - Lung testing looked worse today. - We will start you on a prednisone burst today. - Let's push the Wixela and start [employer] contains three medications to help keep your lungs healthy and happy. - Samples provided. - [place] and [place] filled out.  - Daily controller medication(s): Breztri two puffs twice daily with spacer + Singulair (montelukast) 10mg  daily - Prior to physical activity: albuterol 2 puffs 10-15 minutes before physical activity. - Rescue medications: albuterol 4 puffs every 4-6 hours as needed - Asthma control goals:  * Full participation in all desired activities (may need albuterol before activity) * Albuterol use two time or less a week on average (not counting use with activity) * Cough interfering with sleep two time or less a month * Oral steroids no more than once a year * No hospitalizations  2. 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 - Continue with Mucinex as needed.  - We can continue to consider Dupixent for management of your nasal polyps and your asthma.   3. Return in about 3 months (around 12/29/2023).   Subjective:   Mary Chase is a 88 y.o. female presenting today for follow up of  Chief Complaint  Patient presents with   Asthma    Mary Chase has a history of the  following: Patient Active Problem List   Diagnosis Date Noted   Asthma exacerbation 07/05/2023   CKD stage 3a, GFR 45-59 ml/min (HCC) 07/05/2023   CAD (coronary artery disease), native coronary artery 06/30/2023   Edema of both ankles 06/22/2023   Ascending aorta dilatation (HCC) 06/04/2023   Pulmonary hypertension (HCC) 06/04/2023   Mitral regurgitation 06/04/2023   Tricuspid regurgitation 06/04/2023   Aortic stenosis 06/04/2023   COPD exacerbation (HCC) 05/04/2023   Asthma, chronic, unspecified asthma severity, with acute exacerbation 05/03/2023   HTN (hypertension) 05/03/2023   Acute respiratory failure with hypoxia (HCC) 05/03/2023   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   Osteoarthritis of right hip 09/11/2020   Obstructive lung disease (HCC) 08/31/2015   H/O allergic rhinitis 08/31/2015    History obtained from: chart review and patient.  Discussed the use of AI scribe software for clinical note transcription with the patient and/or guardian, who gave verbal consent to proceed.  Mary Chase is a 88 y.o. female presenting for a follow up visit.  She was last seen in June 2024.  At that time, her lung testing looked a bit better.  We continue with Singulair and Wixela 1 puff twice daily.  She has albuterol to use before physical activity.  For her rhinitis, we continue with Flonase as well as Mucinex.  Since last visit, she unfortunately has had a complicated course.    The patient, with a history of asthma, presents with worsening respiratory symptoms over the past two weeks. They have been managing symptoms  at home with breathing treatments every four hours and their regular medication. Despite this, the patient reports their breathing has been "horrible." They last received prednisone approximately three weeks ago from their primary care provider, which they believe may have helped.  The patient has been on Wixela for their asthma, but a recent suggestion was made to switch to  [facility]. However, the patient is unsure if this was due to issues with Wixela or not. They express concern about the cost of Dulera and potential coughing side effects.  It is unclear whether Mary Chase was never picked up.  She tells me that it was around the $100 for the co-pay compared to $47 a month for the [facility].  She really does not want to pay to $100 if I do not think it is worth it.  She has never been on the triple therapy. The patient has been hesitant about starting Dupixent, an injectable medication for their asthma, due to potential side effects and cost.  We gave her a flyer at the last visit and she clearly read through it, as she had questions about multiple side effects of the medication.  She has been seeing Mary Chase, but only once in July. She has appointments with him on the 22nd and 24th of this month.   Otherwise, there have been no changes to her past medical history, surgical history, family history, or social history.    Review of systems otherwise negative other than that mentioned in the HPI.    Objective:   Blood pressure 120/68, pulse 86, temperature 98.4 F (36.9 C), temperature source Temporal, resp. rate 18, weight 166 lb 3.2 oz (75.4 kg), SpO2 96%. Body mass index is 32.46 kg/m.    Physical Exam Vitals reviewed.  Constitutional:      Appearance: Normal appearance. She is well-developed.     Comments: Very pleasant female.  Extremely talkative.  Speaking in full sentences.  HENT:     Head: Normocephalic and atraumatic.     Right Ear: Tympanic membrane, ear canal and external ear normal. No drainage, swelling or tenderness. Tympanic membrane is not injected, scarred, erythematous, retracted or bulging.     Left Ear: Tympanic membrane, ear canal and external ear normal. No drainage, swelling or tenderness. Tympanic membrane is not injected, scarred, erythematous, retracted or bulging.     Nose: Nasal deformity present. No septal deviation, mucosal edema or  rhinorrhea.     Right Turbinates: Enlarged, swollen and pale.     Left Turbinates: Enlarged, swollen and pale.     Right Sinus: No maxillary sinus tenderness or frontal sinus tenderness.     Left Sinus: No maxillary sinus tenderness or frontal sinus tenderness.     Comments: Polyp on the left much smaller, obstructing approximately 25% of the nasal cavity. This has not changed much over the last 3 months since I saw her last time.     Mouth/Throat:     Lips: Pink.     Mouth: Mucous membranes are moist. Mucous membranes are not pale and not dry.     Pharynx: Uvula midline.     Comments: Cobblestoning in the posterior oropharynx.  Eyes:     General:        Right eye: No discharge.        Left eye: No discharge.     Conjunctiva/sclera: Conjunctivae normal.     Right eye: Right conjunctiva is not injected. No chemosis.    Left eye: Left conjunctiva is not injected.  No chemosis.    Pupils: Pupils are equal, round, and reactive to light.  Cardiovascular:     Rate and Rhythm: Normal rate and regular rhythm.     Heart sounds: Normal heart sounds.  Pulmonary:     Effort: Pulmonary effort is normal. No tachypnea, accessory muscle usage or respiratory distress.     Breath sounds: Decreased air movement present. Examination of the right-lower field reveals wheezing. Examination of the left-lower field reveals wheezing. Wheezing present. No rhonchi or rales.     Comments: Decreased air movement at the bases. No crackles noted.  Chest:     Chest wall: No tenderness.  Musculoskeletal:     Cervical back: No rigidity.  Lymphadenopathy:     Head:     Right side of head: No submandibular, tonsillar or occipital adenopathy.     Left side of head: No submandibular, tonsillar or occipital adenopathy.     Cervical: No cervical adenopathy.  Skin:    Coloration: Skin is not pale.     Findings: No abrasion, erythema, petechiae or rash. Rash is not papular, urticarial or vesicular.  Neurological:      Mental Status: She is alert.  Psychiatric:        Behavior: Behavior is cooperative.      Diagnostic studies:    Spirometry: results normal (FEV1: 0.77/59%, FVC: 1.34/79%, FEV1/FVC: 57%).    Spirometry consistent with moderate obstructive disease.  This is lower than last time I saw her.  Allergy Studies: none        Malachi Bonds, MD  [facility]

## 2023-09-28 NOTE — Patient Instructions (Addendum)
1. Moderate persistent asthma, uncomplicated - Lung testing looked worse today. - We will start you on a prednisone burst today. - Let's push the Wixela and start [employer] contains three medications to help keep your lungs healthy and happy. - Samples provided. - [place] and [place] form filled out.  - Daily controller medication(s): Breztri two puffs twice daily with spacer + Singulair (montelukast) 10mg  daily - Prior to physical activity: albuterol 2 puffs 10-15 minutes before physical activity. - Rescue medications: albuterol 4 puffs every 4-6 hours as needed - Asthma control goals:  * Full participation in all desired activities (may need albuterol before activity) * Albuterol use two time or less a week on average (not counting use with activity) * Cough interfering with sleep two time or less a month * Oral steroids no more than once a year * No hospitalizations  2. 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 - Continue with Mucinex as needed.  - We can continue to consider Dupixent for management of your nasal polyps and your asthma.   3. Return in about 3 months (around 12/29/2023).    Please inform us of any Emergency Department visits, hospitalizations, or changes in symptoms. Call us before going to the ED for breathing or allergy symptoms since we might be able to fit you in for a sick visit. Feel free to contact us anytime with any questions, problems, or concerns.  It was a pleasure to see you again today!  Websites that have reliable patient information: 1. American Academy of Asthma, Allergy, and Immunology: www.aaaai.org 2. Food Allergy Research and Education (FARE): foodallergy.org 3. Mothers of Asthmatics: http://www.asthmacommunitynetwork.org 4. American College of Allergy, Asthma, and Immunology: www.acaai.org   COVID-19 Vaccine Information can be found at:  PodExchange.nl For questions related to vaccine distribution or appointments, please email [email or web address] or call [phone].   We realize that you might be concerned about having an allergic reaction to the COVID19 vaccines. To help with that concern, WE ARE OFFERING THE COVID19 VACCINES IN OUR OFFICE! Ask the front desk for dates!     "Like" [place] on Facebook and Instagram for our latest updates!      A healthy democracy works best when [employer] participate! Make sure you are registered to vote! If you have moved or changed any of your contact information, you will need to get this updated before voting!  In some cases, you MAY be able to register to vote online: [email or web address]

## 2023-09-30 ENCOUNTER — Telehealth: Payer: Self-pay

## 2023-09-30 ENCOUNTER — Telehealth: Payer: Self-pay | Admitting: Allergy & Immunology

## 2023-09-30 MED ORDER — BREZTRI AEROSPHERE 160-9-4.8 MCG/ACT IN AERO: 2.0000 | INHALATION_SPRAY | Freq: Two times a day (BID) | RESPIRATORY_TRACT | 11 refills | Status: DC

## 2023-09-30 NOTE — Telephone Encounter (Signed)
Pt is concerned with breztri as the information sheet said it could cause death and it is for copd. I explained to pt we do use this asthma pts and most have seen good control with asthma symptoms on it. She asked what ics laba was I explained it was inhaled corticosteroid and long acting beta agent. She wants a direct call from Dr Dellis Anes before she uses it. I offered to make an appt with him to talk in person but she refused

## 2023-09-30 NOTE — Telephone Encounter (Signed)
Pt request a call back about medication, she states she will not take it until she talks to Dr. Dellis Anes.

## 2023-09-30 NOTE — Telephone Encounter (Signed)
Dr. Dellis Anes started patient on Breztri 2 puffs twice a day. Dr. Dellis Anes would like to get patient enrolled in the AZ&ME Program. Patient has filled out the application and application has been faxed to [phone]. Patient has been notified that the application has been faxed and waiting for approval.

## 2023-09-30 NOTE — Addendum Note (Signed)
Addended by: Dub Mikes on: 09/30/2023 08:15 AM   Modules accepted: Orders

## 2023-10-01 NOTE — Telephone Encounter (Signed)
It is going to be approved for asthma sometime between now and the summer 2025. Many medications are used both in COPD and asthma.   Mary Bonds, MD [facility]

## 2023-10-04 NOTE — Telephone Encounter (Signed)
Lm for pt to call us back about this

## 2023-10-12 ENCOUNTER — Ambulatory Visit: Payer: Medicare Other | Admitting: Pulmonary Disease

## 2023-10-12 DIAGNOSIS — J45901 Unspecified asthma with (acute) exacerbation: Secondary | ICD-10-CM

## 2023-10-12 LAB — PULMONARY FUNCTION TEST
DL/VA % pred: 119 %
DL/VA: 4.97 ml/min/mmHg/L
DLCO cor % pred: 65 %
DLCO cor: 10.56 ml/min/mmHg
DLCO unc % pred: 65 %
DLCO unc: 10.56 ml/min/mmHg
FEF 25-75 Post: 0.49 L/s
FEF 25-75 Pre: 0.31 L/s
FEF2575-%Change-Post: 60 %
FEF2575-%Pred-Post: 52 %
FEF2575-%Pred-Pre: 32 %
FEV1-%Change-Post: 22 %
FEV1-%Pred-Post: 56 %
FEV1-%Pred-Pre: 45 %
FEV1-Post: 0.78 L
FEV1-Pre: 0.64 L
FEV1FVC-%Change-Post: 9 %
FEV1FVC-%Pred-Pre: 76 %
FEV6-%Change-Post: 9 %
FEV6-%Pred-Post: 70 %
FEV6-%Pred-Pre: 64 %
FEV6-Post: 1.25 L
FEV6-Pre: 1.14 L
FEV6FVC-%Change-Post: -1 %
FEV6FVC-%Pred-Post: 105 %
FEV6FVC-%Pred-Pre: 107 %
FVC-%Change-Post: 11 %
FVC-%Pred-Post: 66 %
FVC-%Pred-Pre: 59 %
FVC-Post: 1.27 L
FVC-Pre: 1.14 L
Post FEV1/FVC ratio: 61 %
Post FEV6/FVC ratio: 98 %
Pre FEV1/FVC ratio: 56 %
Pre FEV6/FVC Ratio: 100 %
RV % pred: 138 %
RV: 3.18 L
TLC % pred: 102 %
TLC: 4.54 L

## 2023-10-12 NOTE — Progress Notes (Signed)
Full PFT performed today. °

## 2023-10-12 NOTE — Patient Instructions (Signed)
Full PFT performed today. °

## 2023-10-14 ENCOUNTER — Ambulatory Visit: Payer: Medicare Other | Admitting: Pulmonary Disease

## 2023-10-14 ENCOUNTER — Encounter: Payer: Self-pay | Admitting: Pulmonary Disease

## 2023-10-14 VITALS — BP 122/70 | HR 97 | Ht 60.0 in | Wt 168.8 lb

## 2023-10-14 DIAGNOSIS — J4550 Severe persistent asthma, uncomplicated: Secondary | ICD-10-CM | POA: Diagnosis not present

## 2023-10-14 NOTE — Patient Instructions (Signed)
VISIT SUMMARY:  During your follow-up visit, we discussed your recent hospitalizations due to severe asthma and your current treatment plan. You have started using a new inhaler, Breztri, which seems to be working well for you. We also talked about the possibility of trying biologic injection therapy in the future. Additionally, we addressed your occasional indigestion and nausea.  YOUR PLAN:  -SEVERE ASTHMA: Severe asthma is a chronic condition where the airways in your lungs become inflamed and narrowed, making it difficult to breathe. You have recently switched to a new inhaler, Breztri, which you should continue using at a dose of 2 puffs twice a day. We also discussed the option of trying Dupixent, a biologic therapy, in the future if your hospitalizations continue despite using Breztri.  -GASTROESOPHAGEAL REFLUX DISEASE (GERD): GERD is a condition where stomach acid frequently flows back into the tube connecting your mouth and stomach, causing indigestion and nausea. You should continue using over-the-counter remedies like Tums or Pepcid as needed. If your symptoms become more frequent, we may consider starting a daily medication.  INSTRUCTIONS:  Please follow up in 6 months or sooner if your symptoms worsen.

## 2023-10-14 NOTE — Progress Notes (Signed)
Mary Chase    [id number]    05-13-1935  Primary Care Physician:Varadarajan, Soyla Murphy, MD  Referring Physician: Lorenda Ishihara, MD [address] [place],  [place] [address]  Chief complaint: Consult for asthma  HPI: 88 y.o. who  has a past medical history of Age related osteoporosis, Allergic rhinitis, Aortic stenosis, Arthritis, Ascending aorta dilatation (HCC), Asthma, Atherosclerosis of native artery of both lower extremities with intermittent claudication (HCC), CAD (coronary artery disease), native coronary artery, Cataract, Chronic kidney disease, stage 3a (HCC), COPD (chronic obstructive pulmonary disease) (HCC), Degenerative joint disease (DJD) of hip, Dyspnea, GERD (gastroesophageal reflux disease), Headache, Hypercholesterolemia, Hypertension, Hypertensive retinopathy, Mitral regurgitation, Osteoarthritis of hip, Pneumonia, Pre-diabetes, Pulmonary hypertension (HCC), PVD (peripheral vascular disease) (HCC), Sickle cell anemia (HCC), Sickle cell trait (HCC), and Tricuspid regurgitation.   She has history of allergic asthma and follows with Dr. Dellis Anes with positive RAST panel and significantly elevated IgE.  Maintained on Wixela and Singulair.  Hospitalized in May 2024 with asthma exacerbation secondary to parainfluenza virus.  At that time lisinopril was held and she was started on losartan to help with the cough.  She did not feel well postdischarge and never recovered back to baseline and hospitalized overnight on 7/15 for dyspnea.  Her virus panel at this time were negative.  She was treated with IV steroids, empiric doxycycline with improvement in symptoms  She is also being evaluated by cardiology for severe MR and will need a TEE in the future to evaluate.  Pets: No pets Occupation: Retired [employer] operational officer Exposures: No mold, hot tub, Jacuzzi.  No feather pillows or comforters Smoking history: Never smoker Travel history: No significant  travel history Relevant family history: No family history of lung disease  Interim History Discussed the use of AI scribe software for clinical note transcription with the patient, who gave verbal consent to proceed.  The patient, with a history of severe asthma, presents for a follow-up visit after two recent hospitalizations. They were hospitalized in May and July, with the latter stay being overnight. The May hospitalization was the first in years for their asthma, and they were in the hospital for eight days. They believe the severity of the May episode was due to a concurrent parainfluenza infection.  They have recently been started on a new inhaler, Breztri, by their allergist, Dr. Dellis Anes. They have been using it twice a day, two puffs in the morning and two puffs in the evening, for about a week. They report that it seems to be working well, and they feel it is more effective than their previous inhaler, Vivelle.  They have also been considering biologic injection therapy for their asthma, specifically Dupixent, but they have concerns about the side effects. They have been managing their asthma for a long time and have a good understanding of what works for them. They have also been taking Singulair and have used prednisone to manage an asthma attack at home to avoid hospitalization.  They also report occasional feelings of indigestion or nausea, for which they take over-the-counter medications like Tums or Pepcid.   Outpatient Encounter Medications as of 10/14/2023  Medication Sig   acetaminophen (TYLENOL) 500 MG tablet Take 500 mg by mouth every 6 (six) hours as needed for moderate pain.   albuterol (VENTOLIN HFA) 108 (90 Base) MCG/ACT inhaler INHALE 2 PUFFS INTO THE LUNGS EVERY 6 HOURS AS NEEDED FOR WHEEZE (Patient taking differently: Inhale 1 puff into the lungs every  4 (four) hours as needed for wheezing or shortness of breath.)   amLODipine (NORVASC) 10 MG tablet Take 10 mg by mouth  daily.   aspirin EC 81 MG tablet Take 1 tablet (81 mg total) by mouth 2 (two) times daily.   Budeson-Glycopyrrol-Formoterol (BREZTRI AEROSPHERE) 160-9-4.8 MCG/ACT AERO Inhale 2 puffs into the lungs in the morning and at bedtime.   Cholecalciferol (VITAMIN D3) 25 MCG (1000 UT) CAPS Take 1,000 Units by mouth daily in the afternoon.   Cyanocobalamin (B-12) 5000 MCG CAPS Take 5,000 mcg by mouth daily.   fluticasone (FLONASE) 50 MCG/ACT nasal spray Place 1 spray into both nostrils daily as needed for allergies or rhinitis. (Patient taking differently: Place 1 spray into both nostrils 2 (two) times daily.)   furosemide (LASIX) 20 MG tablet Take 1 tablet (20 mg total) by mouth every other day.   ipratropium-albuterol (DUONEB) 0.5-2.5 (3) MG/3ML SOLN Take 3 mLs by nebulization every 4 (four) hours as needed.   losartan (COZAAR) 50 MG tablet Take 1 tablet (50 mg total) by mouth daily.   montelukast (SINGULAIR) 10 MG tablet Take 1 tablet (10 mg total) by mouth daily.   predniSONE (DELTASONE) 10 MG tablet Take two tablets (20mg ) twice daily for three days, then one tablet (10mg ) twice daily for three days, then STOP.   rosuvastatin (CRESTOR) 5 MG tablet Take 1 tablet (5 mg total) by mouth every other day.   [DISCONTINUED] DULERA 200-5 MCG/ACT AERO Inhale 2 puffs into the lungs daily.   [DISCONTINUED] fluticasone-salmeterol (WIXELA INHUB) 250-50 MCG/ACT AEPB Inhale 1 puff into the lungs in the morning and at bedtime. (Patient not taking: Reported on 09/28/2023)   No facility-administered encounter medications on file as of 10/14/2023.   Physical Exam: Blood pressure 122/70, pulse 97, height 5' (1.524 m), weight 168 lb 12.8 oz (76.6 kg), SpO2 97%. Gen:      No acute distress HEENT:  EOMI, sclera anicteric Neck:     No masses; no thyromegaly Lungs:    Clear to auscultation bilaterally; normal respiratory effort CV:         Regular rate and rhythm; no murmurs Abd:      + bowel sounds; soft, non-tender; no  palpable masses, no distension Ext:    No edema; adequate peripheral perfusion Skin:      Warm and dry; no rash Neuro: alert and oriented x 3 Psych: normal mood and affect   Data Reviewed: Imaging: CT abdomen pelvis 08/28/2022-mild atelectasis at the base.  CT coronaries 06/29/2023- bibasilar linear atelectasis/scarring. I had reviewed the images personally  PFTs: Spirometry 05/28/2023 FVC 2.52 [147%], FEV1 0.69 [53%], F/F 0.27 Moderate obstructive airway disease  10/12/2023 FVC 1.27 [66%], FEV1 0.78 [56%], F/F61, TLC 4.54 [102%], DLCO 10.56 [65%] Mild obstruction, mild diffusion defect  Labs: CBC 04/14/2022 WBC 8.2, eos 6%, absolute eosinophil count 492 CBC 07/07/2023 WBC 16.8, eos 0% IgE 07/07/2023- 4778  FENO 07/07/2023- 54  Assessment:  Severe Asthma Recent hospitalizations in May and July. Transitioned from [place] to [place] (2 puffs BID) with perceived improvement. Mild reduction in lung function on recent PFTs. Discussed potential for biologic therapy (Dupixent), but patient has concerns about side effects and wants to consider further.  -Continue Breztri 2 puffs BID.,  Singulair -Consider Dupixent in the future if hospitalizations recur despite Breztri.  Gastroesophageal Reflux Disease (GERD) Reports intermittent symptoms of indigestion and nausea. Currently using over-the-counter remedies as needed. -Continue over-the-counter remedies as needed for GERD symptoms. Consider daily medication if symptoms become  more frequent.  Follow-up in 6 months or sooner if symptoms worsen.   Plan/Recommendations: Continue Breztri, Singulair  Chilton Greathouse MD [facility] 10/14/2023, 1:29 PM  CC: Lorenda Ishihara

## 2023-11-22 ENCOUNTER — Telehealth: Payer: Self-pay | Admitting: Allergy & Immunology

## 2023-11-22 NOTE — Telephone Encounter (Signed)
Dr. Dellis Anes would these be side effects of Breztri?

## 2023-11-22 NOTE — Telephone Encounter (Signed)
Patient is having issues with new medication (Bloating, and constipation)  BREZTRI AEROSPHERE asking for nurse to call her back

## 2023-11-23 NOTE — Telephone Encounter (Signed)
No - she may have some urinary retention, but bloating and constipation is not common. Can we decrease the dose to one puff BID to see if that helps at all?   Malachi Bonds, MD [facility]

## 2023-11-26 NOTE — Telephone Encounter (Signed)
Noted - thank you!   Coreon Simkins, MD [facility]  

## 2023-12-09 ENCOUNTER — Other Ambulatory Visit: Payer: Self-pay | Admitting: Allergy & Immunology

## 2024-01-01 ENCOUNTER — Other Ambulatory Visit: Payer: Self-pay | Admitting: Allergy & Immunology

## 2024-01-03 ENCOUNTER — Telehealth: Payer: Self-pay | Admitting: Allergy & Immunology

## 2024-01-03 MED ORDER — PREDNISONE 10 MG PO TABS: ORAL_TABLET | ORAL | 0 refills | Status: DC

## 2024-01-03 NOTE — Telephone Encounter (Signed)
 Patient called and asked if Dr.Gallagher can send in a package of Prednisone  as she is short of breath. She stated with the bad weather going on, she feel as if she wont be able to breathe and may have to go to the ER.  Patient wants that sent over to [facility] on [address]  Patient wants a call back at [phone]

## 2024-01-03 NOTE — Addendum Note (Signed)
 Addended by: Alfonse Spruce on: 01/03/2024 09:12 PM   Modules accepted: Orders

## 2024-01-03 NOTE — Telephone Encounter (Signed)
 Can you see about getting pt in to be seen today

## 2024-01-03 NOTE — Telephone Encounter (Signed)
 Can we overbook her earlier? Anything with the Nurse Practitioner? January 21st is too far away.   I did send in prednisone, but I do not want to miss something larger.   Malachi Bonds, MD [facility]

## 2024-01-04 NOTE — Telephone Encounter (Signed)
 Called and spoke to patient and informed her of the message and medication approval. Patient scheduled an appointment with Thurston Hole on Thursday of this week to be seen.

## 2024-01-04 NOTE — Telephone Encounter (Signed)
 Thank you :)

## 2024-01-06 ENCOUNTER — Encounter: Payer: Self-pay | Admitting: Family Medicine

## 2024-01-06 ENCOUNTER — Ambulatory Visit: Payer: Medicare Other | Admitting: Family Medicine

## 2024-01-06 VITALS — BP 140/70 | HR 118 | Temp 98.8°F | Resp 16

## 2024-01-06 DIAGNOSIS — J330 Polyp of nasal cavity: Secondary | ICD-10-CM | POA: Diagnosis not present

## 2024-01-06 DIAGNOSIS — J302 Other seasonal allergic rhinitis: Secondary | ICD-10-CM | POA: Diagnosis not present

## 2024-01-06 DIAGNOSIS — J4541 Moderate persistent asthma with (acute) exacerbation: Secondary | ICD-10-CM | POA: Diagnosis not present

## 2024-01-06 DIAGNOSIS — J4489 Other specified chronic obstructive pulmonary disease: Secondary | ICD-10-CM | POA: Diagnosis not present

## 2024-01-06 DIAGNOSIS — J3089 Other allergic rhinitis: Secondary | ICD-10-CM

## 2024-01-06 NOTE — Progress Notes (Addendum)
[address] [place] [address] Dept: [phone]  FOLLOW UP NOTE  Patient ID: Mary Chase, female    DOB: March 30, 1935  Age: 89 y.o. MRN: [id number] Date of Office Visit: 01/06/2024  Assessment  Chief Complaint: Follow-up and Medication Refill  HPI Mary Chase is an 89 year old female who presents to the clinic for follow-up visit.  She was last seen in this clinic on 09/28/2023 for evaluation of asthma, allergic rhinitis, and nasal polyposis.  She is accompanied by her son who assists with history.  At today's visit, she reports that 3 days ago she began to develop shortness of breath, wheezing, and coughing producing clear mucus for which she began using Breztri 1 puff in the morning and 1 puff in the evening as well as starting a prednisone pack that she had on hand at her house.  She is taking prednisone 10 mg 2 tablets twice a day for 3 days and 1 tablet twice a day for 3 days.  She reports her symptoms are beginning to subside.  She denies fever, sweats, chills, or sick contacts.  We had a discussion about the side effects of prednisone and the importance of using Breztri on a regular basis to control asthma and COPD symptoms.  She reports that when she uses Breztri 2 puffs twice a day she begins to feel more short of breath.  She is not interested in me using Breztri more than 1 puff twice a day for control of asthma and COPD symptoms.  She reports that she has tried Trelegy, however, reports that she would like to continue with her current regimen of Breztri.  She has considered using Dupixent injections for asthma control, however, she is not interested in Dupixent at this time as she has recently been diagnosed with mitral valve disorder and does not want to complicate her heart care.  She does report recent bilateral lower extremity swelling which resolves when she elevates her legs.  She denies recent weight gain and continues Lasix per previous order.  Allergic rhinitis  is reported as moderately well-controlled with occasional symptoms including rhinorrhea, nasal congestion, sneezing, and postnasal drainage.  She continues Flonase daily and is not currently using an antihistamine or nasal saline rinse.  She has completed several years of allergen immunotherapy containing dust mite and tree pollen.  She reports a significant decrease in her symptoms of allergic rhinitis after completing allergen immunotherapy.  Her last maintenance therapy injection was on 07/17/2016.  She is interested in retesting her environmental allergy profile at today's visit with the possibility of restarting allergen immunotherapy.  Nasal polyposis is reported as moderately well-controlled with occasional nasal congestion.  She reports that she has lost her sense of smell quite sometime ago, however, continues to be able to taste her food.  She continues Flonase nasal spray daily and is not interested in Dupixent for nasal polyposis at this time.  Her current medications are listed in the chart.  Drug Allergies:  Allergies  Allergen Reactions   Penicillins Shortness Of Breath and Rash   Alendronate Sodium     hair loss, muscle aches, SOB,weight issues   Cephalexin Itching   Sulfa Antibiotics Itching and Other (See Comments)    Swelling in mouth   Sulfacetamide Sodium Itching   Other Itching    Some beans     Physical Exam: BP (!) 140/70   Pulse (!) 118   Temp 98.8 F (37.1 C) (Temporal)   Resp 16  SpO2 96%    Physical Exam Vitals reviewed.  Constitutional:      Appearance: Normal appearance.  HENT:     Head: Normocephalic and atraumatic.     Right Ear: Tympanic membrane normal.     Left Ear: Tympanic membrane normal.     Nose:     Comments: Bilateral naris slightly erythematous with thin clear nasal drainage noted.    Mouth/Throat:     Pharynx: Oropharynx is clear.  Eyes:     Conjunctiva/sclera: Conjunctivae normal.  Cardiovascular:     Rate and Rhythm: Normal rate  and regular rhythm.     Heart sounds: Murmur heard.  Pulmonary:     Effort: Pulmonary effort is normal.     Breath sounds: Normal breath sounds.     Comments: Scattered bilateral rhonchi which cleared with cough Musculoskeletal:        General: Normal range of motion.     Cervical back: Normal range of motion and neck supple.  Skin:    General: Skin is warm and dry.  Neurological:     Mental Status: She is alert and oriented to person, place, and time.  Psychiatric:        Mood and Affect: Mood normal.        Behavior: Behavior normal.        Thought Content: Thought content normal.        Judgment: Judgment normal.     Diagnostics: FVC 1.30 which is 76% of predicted value, FEV1 0.65 which is 50% of predicted value.  Spirometry indicates airway obstruction.  Postbronchodilator FVC 1.31, FEV1 0.64.  Postbronchodilator spirometry indicates no improvement in FEV1 follow-up of  Assessment and Plan: 1. Moderate persistent asthma with acute exacerbation   2. Seasonal and perennial allergic rhinitis   3. Polyp of nasal cavity   4. Asthma-COPD overlap syndrome (HCC)     No orders of the defined types were placed in this encounter.   Patient Instructions  Asthma/COPD overlap syndrome Your lung testing was not great today Continue your current prednisone taper Continue Breztri 1 puff in the morning and 1 puff in the evening.  If your asthma symptoms worsen begin Breztri 2 puffs in the morning and 2 puffs in the evening Continue albuterol 2 puffs every 4 hours as needed for cough or wheeze OR Instead use albuterol 0.083% solution via nebulizer one unit vial every 4 hours as needed for cough or wheeze Consider Dupixent injections for asthma control  Allergic rhinitis Continue allergen avoidance measures directed toward ragweed pollen and tree pollen as listed below Continue Flonase 2 sprays in each nostril once a day if needed for stuffy nose Consider saline nasal rinses as needed  for nasal symptoms. Use this before any medicated nasal sprays for best result A lab has been ordered to help [place] evaluate your environmental allergies. We will call you when the results become available  Nasal polyposis Continue Flonase 2 sprays in each nostril once a day to control nasal polyps Consider Dupixent injections to control nasal polyps  Call the clinic if this treatment plan is not working well for you.  Follow up in 1 month or sooner if needed.   Return in about 4 weeks (around 02/03/2024), or if symptoms worsen or fail to improve.    Thank you for the opportunity to care for this patient.  Please do not hesitate to contact me with questions.  Thermon Leyland, FNP [facility]

## 2024-01-06 NOTE — Patient Instructions (Addendum)
Asthma/COPD overlap syndrome Your lung testing was not great today Continue your current prednisone taper Continue Breztri 1 puff in the morning and 1 puff in the evening.  If your asthma symptoms worsen begin Breztri 2 puffs in the morning and 2 puffs in the evening Continue albuterol 2 puffs every 4 hours as needed for cough or wheeze OR Instead use albuterol 0.083% solution via nebulizer one unit vial every 4 hours as needed for cough or wheeze Consider Dupixent injections for asthma control  Allergic rhinitis Continue allergen avoidance measures directed toward ragweed pollen and tree pollen as listed below Continue Flonase 2 sprays in each nostril once a day if needed for stuffy nose Consider saline nasal rinses as needed for nasal symptoms. Use this before any medicated nasal sprays for best result A lab has been ordered to help [place] evaluate your environmental allergies. We will call you when the results become available  Nasal polyposis Continue Flonase 2 sprays in each nostril once a day to control nasal polyps Consider Dupixent injections to control nasal polyps  Call the clinic if this treatment plan is not working well for you.  Follow up in 1 month or sooner if needed.  Reducing Pollen Exposure The American Academy of Allergy, Asthma and Immunology suggests the following steps to reduce your exposure to pollen during allergy seasons. Do not hang sheets or clothing out to dry; pollen may collect on these items. Do not mow lawns or spend time around freshly cut grass; mowing stirs up pollen. Keep windows closed at night.  Keep car windows closed while driving. Minimize morning activities outdoors, a time when pollen counts are usually at their highest. Stay indoors as much as possible when pollen counts or humidity is high and on windy days when pollen tends to remain in the air longer. Use air conditioning when possible.  Many air conditioners have filters that trap the pollen  spores. Use a HEPA room air filter to remove pollen form the indoor air you breathe.

## 2024-01-08 LAB — ALLERGENS, ZONE 2
Alternaria Alternata IgE: 0.14 kU/L — AB
Amer Sycamore IgE Qn: 0.36 kU/L — AB
Aspergillus Fumigatus IgE: 0.12 kU/L — AB
Bahia Grass IgE: 0.19 kU/L — AB
Bermuda Grass IgE: 0.13 kU/L — AB
Cat Dander IgE: <0.1 kU/L
Cedar, Mountain IgE: 0.36 kU/L — AB
Cladosporium Herbarum IgE: 0.29 kU/L — AB
Cockroach, American IgE: 0.16 kU/L — AB
Common Silver Birch IgE: 0.31 kU/L — AB
D Farinae IgE: 0.57 kU/L — AB
D Pteronyssinus IgE: 0.44 kU/L — AB
Dog Dander IgE: <0.1 kU/L
Elm, American IgE: 0.4 kU/L — AB
Hickory, White IgE: 0.14 kU/L — AB
Johnson Grass IgE: 0.23 kU/L — AB
Maple/Box Elder IgE: 0.4 kU/L — AB
Mucor Racemosus IgE: 0.19 kU/L — AB
Mugwort IgE Qn: 0.14 kU/L — AB
Nettle IgE: 0.49 kU/L — AB
Oak, White IgE: 0.26 kU/L — AB
Penicillium Chrysogen IgE: <0.1 kU/L
Pigweed, Rough IgE: 0.2 kU/L — AB
Plantain, English IgE: 0.33 kU/L — AB
Ragweed, Short IgE: 0.1 kU/L — AB
Sheep Sorrel IgE Qn: <0.1 kU/L
Stemphylium Herbarum IgE: 0.11 kU/L — AB
Sweet gum IgE RAST Ql: 0.17 kU/L — AB
Timothy Grass IgE: 0.27 kU/L — AB
White Mulberry IgE: 0.33 kU/L — AB

## 2024-01-11 ENCOUNTER — Ambulatory Visit: Payer: Medicare Other | Admitting: Allergy & Immunology

## 2024-01-17 ENCOUNTER — Other Ambulatory Visit: Payer: Self-pay | Admitting: Allergy & Immunology

## 2024-01-17 NOTE — Progress Notes (Signed)
Can you please let this patient know that her environmental allergy testing was low level positive to dust mites, pollens, mold, and cockroach. Thank you

## 2024-01-18 ENCOUNTER — Telehealth: Payer: Self-pay | Admitting: Allergy & Immunology

## 2024-01-18 MED ORDER — ALBUTEROL SULFATE (2.5 MG/3ML) 0.083% IN NEBU: 2.5000 mg | INHALATION_SOLUTION | RESPIRATORY_TRACT | 1 refills | Status: DC | PRN

## 2024-01-18 NOTE — Telephone Encounter (Signed)
Patient called stating she needs a refill on her Duoneb sent to [facility] on [address].

## 2024-01-18 NOTE — Telephone Encounter (Signed)
Called and spoke to patient and informed her that the duoneb would need to be approved by [facility] Hole as she nor Dr. Dellis Anes never prescribed her Duoneb. Duoneb was ordered by another physician. Patient agreed to try the albuterol that was originally ordered by Thurston Hole. I have sent in the Albuterol nebulizing liquid.

## 2024-02-01 DIAGNOSIS — R7309 Other abnormal glucose: Secondary | ICD-10-CM | POA: Diagnosis not present

## 2024-02-01 DIAGNOSIS — H04123 Dry eye syndrome of bilateral lacrimal glands: Secondary | ICD-10-CM | POA: Diagnosis not present

## 2024-02-01 DIAGNOSIS — H353121 Nonexudative age-related macular degeneration, left eye, early dry stage: Secondary | ICD-10-CM | POA: Diagnosis not present

## 2024-02-01 DIAGNOSIS — H35033 Hypertensive retinopathy, bilateral: Secondary | ICD-10-CM | POA: Diagnosis not present

## 2024-02-08 ENCOUNTER — Ambulatory Visit: Payer: Medicare Other | Admitting: Allergy & Immunology

## 2024-02-08 ENCOUNTER — Encounter: Payer: Self-pay | Admitting: Allergy & Immunology

## 2024-02-08 ENCOUNTER — Other Ambulatory Visit: Payer: Self-pay

## 2024-02-08 VITALS — BP 124/70 | HR 92 | Resp 18

## 2024-02-08 DIAGNOSIS — R0602 Shortness of breath: Secondary | ICD-10-CM | POA: Diagnosis not present

## 2024-02-08 DIAGNOSIS — J330 Polyp of nasal cavity: Secondary | ICD-10-CM

## 2024-02-08 DIAGNOSIS — J4540 Moderate persistent asthma, uncomplicated: Secondary | ICD-10-CM | POA: Diagnosis not present

## 2024-02-08 DIAGNOSIS — J302 Other seasonal allergic rhinitis: Secondary | ICD-10-CM | POA: Diagnosis not present

## 2024-02-08 DIAGNOSIS — J3089 Other allergic rhinitis: Secondary | ICD-10-CM | POA: Diagnosis not present

## 2024-02-08 MED ORDER — TRELEGY ELLIPTA 200-62.5-25 MCG/ACT IN AEPB: 1.0000 | INHALATION_SPRAY | Freq: Every day | RESPIRATORY_TRACT | 5 refills | Status: DC

## 2024-02-08 MED ORDER — ALBUTEROL SULFATE (2.5 MG/3ML) 0.083% IN NEBU: 2.5000 mg | INHALATION_SOLUTION | RESPIRATORY_TRACT | 1 refills | Status: DC | PRN

## 2024-02-08 MED ORDER — PREDNISONE 10 MG PO TABS: ORAL_TABLET | ORAL | 0 refills | Status: DC

## 2024-02-08 MED ORDER — ALBUTEROL SULFATE HFA 108 (90 BASE) MCG/ACT IN AERS: 4.0000 | INHALATION_SPRAY | RESPIRATORY_TRACT | 1 refills | Status: DC | PRN

## 2024-02-08 NOTE — Progress Notes (Unsigned)
FOLLOW UP  Date of Service/Encounter:  02/08/24   Assessment:   Moderate persistent asthma, uncomplicated - with three rounds of prednisone in less than 6 months   Strongly consider Dupixent (have discussed with patient and she is worried about the side effects), seems open to [employer] (provided handout today)   Chronic rhinitis (grasses, weeds, ragweed, trees, molds, dust mites, roach, cat, dog) - s/p years of allergen immunotherapy   Elevated IgE - with normal SPEP and UPEP    Polyp of left nasal cavity - much better with the regular use of fluticasone nasal spray  Plan/Recommendations:   1. Moderate persistent asthma, uncomplicated - Lung testing looked worse today. - STOP the BREZTRI. - START TRELEGY one puff once daily. - Start the long low-dose prednisone taper provided.  - Consider Tezspire or Harrington Challenger for long-term control to keep you off of prednisone. - Prednisone has a number of side effects that we would rather than have to risk.  - Get a chest X-ray to make sure that we are not missing anything else.  - Daily controller medication(s): Trelegy one puff once daily + Singulair (montelukast) 10mg  daily - Prior to physical activity: albuterol 2 puffs 10-15 minutes before physical activity. - Rescue medications: albuterol 4 puffs every 4-6 hours as needed - Asthma control goals:  * Full participation in all desired activities (may need albuterol before activity) * Albuterol use two time or less a week on average (not counting use with activity) * Cough interfering with sleep two time or less a month * Oral steroids no more than once a year * No hospitalizations  2. 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 - Continue with Mucinex as needed.   3. Return in about 6 weeks (around 03/21/2024). You can have the follow up appointment with Dr. Dellis Anes or a Nurse Practicioner (our Nurse Practitioners are  excellent and always have Physician oversight!).   Subjective:   Mary Chase is a 89 y.o. female presenting today for follow up of No chief complaint on file.   Mary Chase has a history of the following: Patient Active Problem List   Diagnosis Date Noted   Seasonal and perennial allergic rhinitis 01/06/2024   Polyp of nasal cavity 01/06/2024   Asthma-COPD overlap syndrome (HCC) 01/06/2024   Moderate persistent asthma with acute exacerbation 07/05/2023   CKD stage 3a, GFR 45-59 ml/min (HCC) 07/05/2023   CAD (coronary artery disease), native coronary artery 06/30/2023   Edema of both ankles 06/22/2023   Ascending aorta dilatation (HCC) 06/04/2023   Pulmonary hypertension (HCC) 06/04/2023   Mitral regurgitation 06/04/2023   Tricuspid regurgitation 06/04/2023   Aortic stenosis 06/04/2023   COPD exacerbation (HCC) 05/04/2023   Asthma, chronic, unspecified asthma severity, with acute exacerbation 05/03/2023   HTN (hypertension) 05/03/2023   Acute respiratory failure with hypoxia (HCC) 05/03/2023   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   Osteoarthritis of right hip 09/11/2020   Obstructive lung disease (HCC) 08/31/2015   H/O allergic rhinitis 08/31/2015    History obtained from: chart review and patient.  Discussed the use of AI scribe software for clinical note transcription with the patient and/or guardian, who gave verbal consent to proceed.  Mary Chase is a 89 y.o. female presenting for a follow up visit.  She was last seen in January 2025.  At that time, we continued her on a prednisone taper.  We continue with Breztri 1 puff twice daily.  Dupixent injections were discussed for better control.  For her allergic rhinitis, she continued on Flonase.  She got lab work that showed environmental allergies including positives to dust mites, pollens, mold, and cockroach.  Since the last visit, she has done well.   Mary Chase is an 89 year old female with respiratory  issues who presents with worsening breathing difficulties.   She experiences significant breathing difficulties, describing her breathing as 'a mess' and 'horrible'. Symptoms include tiredness, weakness, and sneezing. Her breathing was so poor today that she required a wheelchair. She has a lack of appetite, often not eating until late at night when her son returns from work. No fever, but she feels very hot at times during the night, with temperatures around 97-98 degrees.  She has been using Breztri, one puff twice a day, but reports a 'weird reaction' to it, including chest heaviness and back pain. She previously used [place] but is unsure why it was changed. She also mentions using Singulair, which she was told could cause coughing, and notes increased coughing since starting it. She has a history of using prednisone, which she finds effective but is concerned about its impact on her bone structure. She last used prednisone in January 2025, completing a six-day course. She has not had a recent chest x-ray, with the last one being in July of the previous year. She has not seen her lung doctor this year. She has not had a chest CT in several years.   She has a history of heart issues, specifically a small leak, and expresses concern about medications that could exacerbate this condition. She has never felt this poorly before and notes that even prednisone did not help as much as it usually does. Her last visit with Cardiology in August 2024 where it is noted that she has non-obstructing CAD as well as mild mitral regurgitation in combination with hypertension and hyperlipidemia. She also has CKD stage 3a and ascending aorta dilation.   She has a history of nasal polyps and uses nasal sprays, which she feels have helped reduce her visibility. She also mentions a history of allergy shots, which she stopped in 2017, and notes that her allergies seem to have changed since then.     She has been doing  albuterol nebs every 4 hours. She does not feel that the Markus Daft is fully doing it. She is using one puff BID instead of two puffs BID. She felt that she was having some side effects of the two puffs BID dosing, which is why she decreased it.   Allergic Rhinitis Symptom History: She remains on the Flonase which seems to be helping with the nasal polyps and congestion. She does feel that she can breathe better through her nostrils. She also remains on the Mucinex.   Otherwise, there have been no changes to her past medical history, surgical history, family history, or social history.    Review of systems otherwise negative other than that mentioned in the HPI.    Objective:   Blood pressure 124/70, pulse 92, resp. rate 18, SpO2 96%. There is no height or weight on file to calculate BMI.    Physical Exam Vitals reviewed.  Constitutional:      Appearance: Normal appearance. She is well-developed.     Comments: Very pleasant female. Seems a bit more reserved.   HENT:     Head: Normocephalic and atraumatic.     Right Ear: Tympanic membrane, ear canal and external ear normal.  No drainage, swelling or tenderness. Tympanic membrane is not injected, scarred, erythematous, retracted or bulging.     Left Ear: Tympanic membrane, ear canal and external ear normal. No drainage, swelling or tenderness. Tympanic membrane is not injected, scarred, erythematous, retracted or bulging.     Nose: Nasal deformity present. No septal deviation, mucosal edema or rhinorrhea.     Right Turbinates: Enlarged, swollen and pale.     Left Turbinates: Enlarged, swollen and pale.     Right Sinus: No maxillary sinus tenderness or frontal sinus tenderness.     Left Sinus: No maxillary sinus tenderness or frontal sinus tenderness.     Comments: Polyp on the left much smaller, obstructing approximately 25% of the nasal cavity. This has not changed much over the last 3 months since I saw her last time.     Mouth/Throat:      Lips: Pink.     Mouth: Mucous membranes are moist. Mucous membranes are not pale and not dry.     Pharynx: Uvula midline.     Comments: Cobblestoning in the posterior oropharynx.  Eyes:     General: Allergic shiner present.        Right eye: No discharge.        Left eye: No discharge.     Conjunctiva/sclera: Conjunctivae normal.     Right eye: Right conjunctiva is not injected. No chemosis.    Left eye: Left conjunctiva is not injected. No chemosis.    Pupils: Pupils are equal, round, and reactive to light.  Cardiovascular:     Rate and Rhythm: Normal rate and regular rhythm.     Heart sounds: Normal heart sounds.  Pulmonary:     Effort: Pulmonary effort is normal. No tachypnea, accessory muscle usage or respiratory distress.     Breath sounds: Decreased air movement present. Examination of the right-lower field reveals wheezing. Examination of the left-lower field reveals wheezing. Wheezing present. No rhonchi or rales.     Comments: Decreased air movement at the bases. No crackles noted. She does seem more fatigued than previous exams.  Chest:     Chest wall: No tenderness.  Musculoskeletal:     Cervical back: No rigidity.  Lymphadenopathy:     Head:     Right side of head: No submandibular, tonsillar or occipital adenopathy.     Left side of head: No submandibular, tonsillar or occipital adenopathy.     Cervical: No cervical adenopathy.  Skin:    Coloration: Skin is not pale.     Findings: No abrasion, erythema, petechiae or rash. Rash is not papular, urticarial or vesicular.  Neurological:     Mental Status: She is alert.  Psychiatric:        Behavior: Behavior is cooperative.      Diagnostic studies:    Spirometry: results abnormal (FEV1: 0.43/33%, FVC: 0.92/54%, FEV1/FVC: 47%).    Spirometry consistent with mixed obstructive and restrictive disease. This is lower previous spirometric findings.    Allergy Studies: none      Malachi Bonds, MD  [facility]

## 2024-02-08 NOTE — Patient Instructions (Addendum)
1. Moderate persistent asthma, uncomplicated - Lung testing looked worse today. - STOP the BREZTRI. - START TRELEGY one puff once daily. - Start the long low-dose prednisone taper provided.  - Consider Tezspire or Harrington Challenger for long-term control to keep you off of prednisone. - Get a chest X-ray to make sure that we are not missing anything else.  - Daily controller medication(s): Trelegy one puff once daily + Singulair (montelukast) 10mg  daily - Prior to physical activity: albuterol 2 puffs 10-15 minutes before physical activity. - Rescue medications: albuterol 4 puffs every 4-6 hours as needed - Asthma control goals:  * Full participation in all desired activities (may need albuterol before activity) * Albuterol use two time or less a week on average (not counting use with activity) * Cough interfering with sleep two time or less a month * Oral steroids no more than once a year * No hospitalizations  2. 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 - Continue with Mucinex as needed.   3. Return in about 6 weeks (around 03/21/2024). You can have the follow up appointment with Dr. Dellis Anes or a Nurse Practicioner (our Nurse Practitioners are excellent and always have Physician oversight!).    Please inform us of any Emergency Department visits, hospitalizations, or changes in symptoms. Call us before going to the ED for breathing or allergy symptoms since we might be able to fit you in for a sick visit. Feel free to contact us anytime with any questions, problems, or concerns.  It was a pleasure to see you again today!  Websites that have reliable patient information: 1. American Academy of Asthma, Allergy, and Immunology: www.aaaai.org 2. Food Allergy Research and Education (FARE): foodallergy.org 3. Mothers of Asthmatics: http://www.asthmacommunitynetwork.org 4. American College of Allergy, Asthma, and Immunology:  www.acaai.org      "Like" [place] on Facebook and Instagram for our latest updates!      A healthy democracy works best when [employer] participate! Make sure you are registered to vote! If you have moved or changed any of your contact information, you will need to get this updated before voting! Scan the QR codes below to learn more!

## 2024-02-10 ENCOUNTER — Telehealth: Payer: Self-pay

## 2024-02-10 ENCOUNTER — Encounter: Payer: Self-pay | Admitting: Allergy & Immunology

## 2024-02-10 NOTE — Telephone Encounter (Signed)
Patient returned call stating she is following Dr. Ellouise Newer instructions and taking meds prescribed patient is feeling much better  her breathing has improved she did call in reference to the CXR but they was closed due to the weather will call again on 02-11-2024  to schedule

## 2024-02-10 NOTE — Telephone Encounter (Signed)
Alfonse Spruce, MD  [facility] Can someone call the patient to see how she is feeling? Please remind her to get the CXR as well.  Malachi Bonds, MD [facility]  Lm for pt to call us back about this

## 2024-02-14 ENCOUNTER — Other Ambulatory Visit: Payer: Self-pay | Admitting: Family Medicine

## 2024-02-22 ENCOUNTER — Telehealth: Payer: Self-pay | Admitting: Allergy & Immunology

## 2024-02-22 NOTE — Telephone Encounter (Signed)
 Patient called requesting a sample of Trelegy as it is working well for her.

## 2024-02-22 NOTE — Telephone Encounter (Signed)
 Called patient - DOB/NEED DPR verified - advised 2 Trelegy 200 samples, [employer] assistance application and DPR will be on [address] side - ready for p/u.  Patient verbalized understanding to all, no further questions.

## 2024-02-23 NOTE — Telephone Encounter (Signed)
 Samples have been picked up by patient's son.

## 2024-02-29 ENCOUNTER — Other Ambulatory Visit: Payer: Self-pay | Admitting: Allergy & Immunology

## 2024-03-03 ENCOUNTER — Ambulatory Visit
Admission: RE | Admit: 2024-03-03 | Discharge: 2024-03-03 | Disposition: A | Discharge status: Home / Self Care | Source: Ambulatory Visit | Attending: Allergy & Immunology | Admitting: Allergy & Immunology

## 2024-03-03 DIAGNOSIS — R0602 Shortness of breath: Secondary | ICD-10-CM | POA: Diagnosis not present

## 2024-03-03 DIAGNOSIS — J849 Interstitial pulmonary disease, unspecified: Secondary | ICD-10-CM | POA: Diagnosis not present

## 2024-03-03 DIAGNOSIS — J449 Chronic obstructive pulmonary disease, unspecified: Secondary | ICD-10-CM | POA: Diagnosis not present

## 2024-03-03 NOTE — Telephone Encounter (Signed)
 Patient completed/returned  [employer] Patient [employer] and exercise psychologist.  Forwarding updated message to provider to print prescription to fax with application.

## 2024-03-07 DIAGNOSIS — M810 Age-related osteoporosis without current pathological fracture: Secondary | ICD-10-CM | POA: Diagnosis not present

## 2024-03-07 DIAGNOSIS — I129 Hypertensive chronic kidney disease with stage 1 through stage 4 chronic kidney disease, or unspecified chronic kidney disease: Secondary | ICD-10-CM | POA: Diagnosis not present

## 2024-03-07 DIAGNOSIS — I70213 Atherosclerosis of native arteries of extremities with intermittent claudication, bilateral legs: Secondary | ICD-10-CM | POA: Diagnosis not present

## 2024-03-07 DIAGNOSIS — I10 Essential (primary) hypertension: Secondary | ICD-10-CM | POA: Diagnosis not present

## 2024-03-07 DIAGNOSIS — Z23 Encounter for immunization: Secondary | ICD-10-CM | POA: Diagnosis not present

## 2024-03-07 DIAGNOSIS — J45901 Unspecified asthma with (acute) exacerbation: Secondary | ICD-10-CM | POA: Diagnosis not present

## 2024-03-07 DIAGNOSIS — E1122 Type 2 diabetes mellitus with diabetic chronic kidney disease: Secondary | ICD-10-CM | POA: Diagnosis not present

## 2024-03-07 DIAGNOSIS — H35033 Hypertensive retinopathy, bilateral: Secondary | ICD-10-CM | POA: Diagnosis not present

## 2024-03-07 DIAGNOSIS — N1831 Chronic kidney disease, stage 3a: Secondary | ICD-10-CM | POA: Diagnosis not present

## 2024-03-08 ENCOUNTER — Encounter: Payer: Self-pay | Admitting: Allergy & Immunology

## 2024-03-21 ENCOUNTER — Encounter: Payer: Self-pay | Admitting: Allergy & Immunology

## 2024-03-21 ENCOUNTER — Ambulatory Visit (INDEPENDENT_AMBULATORY_CARE_PROVIDER_SITE_OTHER): Payer: Medicare Other | Admitting: Allergy & Immunology

## 2024-03-21 ENCOUNTER — Other Ambulatory Visit: Payer: Self-pay

## 2024-03-21 VITALS — BP 128/74 | HR 96 | Temp 98.3°F | Resp 12

## 2024-03-21 DIAGNOSIS — R0602 Shortness of breath: Secondary | ICD-10-CM

## 2024-03-21 DIAGNOSIS — J4489 Other specified chronic obstructive pulmonary disease: Secondary | ICD-10-CM | POA: Diagnosis not present

## 2024-03-21 DIAGNOSIS — J849 Interstitial pulmonary disease, unspecified: Secondary | ICD-10-CM

## 2024-03-21 DIAGNOSIS — J310 Chronic rhinitis: Secondary | ICD-10-CM

## 2024-03-21 MED ORDER — TRELEGY ELLIPTA 200-62.5-25 MCG/ACT IN AEPB: 1.0000 | INHALATION_SPRAY | Freq: Every day | RESPIRATORY_TRACT | 11 refills | Status: DC

## 2024-03-21 MED ORDER — TRELEGY ELLIPTA 200-62.5-25 MCG/ACT IN AEPB: 1.0000 | INHALATION_SPRAY | Freq: Every day | RESPIRATORY_TRACT | Status: DC

## 2024-03-21 NOTE — Telephone Encounter (Signed)
 Patient came in for an office visit and requested an update on the patient assistance application. Has this been taking care of?

## 2024-03-21 NOTE — Progress Notes (Unsigned)
 Medication Samples have been provided to the patient.  Drug name: 1 puff       Strength: 276mcg/62.25mcg/25mcg        Qty: 2  LOT: [id number]  Exp.Date: 04/19/2025  Dosing instructions: one puff once daily  The patient has been instructed regarding the correct time, dose, and frequency of taking this medication, including desired effects and most common side effects.   Dub Mikes 4:16 PM 03/21/2024

## 2024-03-21 NOTE — Progress Notes (Unsigned)
 FOLLOW UP  Date of Service/Encounter:  03/21/24   Assessment:   Moderate persistent asthma, uncomplicated - with three rounds of prednisone in less than 6 months   Strongly consider Dupixent (have discussed with patient and she is worried about the side effects), seems open to [employer] (provided handout today)   Chronic rhinitis (grasses, weeds, ragweed, trees, molds, dust mites, roach, cat, dog) - s/p years of allergen immunotherapy   Elevated IgE - with normal SPEP and UPEP    Polyp of left nasal cavity - much better with the regular use of fluticasone nasal spray  Plan/Recommendations:   Assessment and Plan Assessment & Plan      There are no Patient Instructions on file for this visit.   Subjective:   Mary Chase is a 89 y.o. female presenting today for follow up of  Chief Complaint  Patient presents with   Asthma    Mary Chase has a history of the following: Patient Active Problem List   Diagnosis Date Noted   Seasonal and perennial allergic rhinitis 01/06/2024   Polyp of nasal cavity 01/06/2024   Asthma-COPD overlap syndrome (HCC) 01/06/2024   Moderate persistent asthma with acute exacerbation 07/05/2023   CKD stage 3a, GFR 45-59 ml/min (HCC) 07/05/2023   CAD (coronary artery disease), native coronary artery 06/30/2023   Edema of both ankles 06/22/2023   Ascending aorta dilatation (HCC) 06/04/2023   Pulmonary hypertension (HCC) 06/04/2023   Mitral regurgitation 06/04/2023   Tricuspid regurgitation 06/04/2023   Aortic stenosis 06/04/2023   COPD exacerbation (HCC) 05/04/2023   Asthma, chronic, unspecified asthma severity, with acute exacerbation 05/03/2023   HTN (hypertension) 05/03/2023   Acute respiratory failure with hypoxia (HCC) 05/03/2023   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   Osteoarthritis of right hip 09/11/2020   Obstructive lung disease (HCC) 08/31/2015   H/O allergic rhinitis 08/31/2015    History obtained from: chart  review and {Persons; PED relatives w/patient:19415::"patient"}.  Discussed the use of AI scribe software for clinical note transcription with the patient and/or guardian, who gave verbal consent to proceed.  Mary Chase is a 89 y.o. female presenting for {Blank single:19197::"a food challenge","a drug challenge","skin testing","a sick visit","an evaluation of ***","a follow up visit"}.  Last saw her in February 2025.  At that time, lung testing looked a little bit worse.  We stopped the [facility] and started Trelegy 200 mcg 1 puff once daily.  We also started her on a low-dose prednisone taper and talked about starting an injectable medicine for long-term control.  We did get a chest x-ray which was normal.  For her rhinitis, we continue with Flonase and Mucinex.  Since last visit, she has mostly done well.   Discussed the use of AI scribe software for clinical note transcription with the patient, who gave verbal consent to proceed.  History of Present Illness     Asthma/Respiratory Symptom History: ***  Allergic Rhinitis Symptom History: ***  Food Allergy Symptom History: ***  Skin Symptom History: ***  GERD Symptom History: ***  Infection Symptom History: ***  Otherwise, there have been no changes to her past medical history, surgical history, family history, or social history.    Review of systems otherwise negative other than that mentioned in the HPI.    Objective:   Blood pressure 128/74, pulse 96, temperature 98.3 F (36.8 C), temperature source Temporal, resp. rate 12, SpO2 98%. There is no height or weight on file to calculate BMI.    Physical Exam  Diagnostic studies:    Spirometry: results abnormal (FEV1: 0.77/60%, FVC: 1.47/87%, FEV1/FVC: 52%).    Spirometry consistent with mild obstructive disease.  This is much better than that obtained at the last visit.  Her FEV1 increased from 0.64 L to 0.77 L.  Her FEV1 increased from 0.92 L up to 1.47 L.  {Blank  single:19197::"Albuterol/Atrovent nebulizer","Xopenex/Atrovent nebulizer","Albuterol nebulizer","Albuterol four puffs via MDI","Xopenex four puffs via MDI"} treatment given in clinic with {Blank single:19197::"significant improvement in FEV1 per ATS criteria","significant improvement in FVC per ATS criteria","significant improvement in FEV1 and FVC per ATS criteria","improvement in FEV1, but not significant per ATS criteria","improvement in FVC, but not significant per ATS criteria","improvement in FEV1 and FVC, but not significant per ATS criteria","no improvement"}.  Allergy Studies: {Blank single:19197::"none","deferred due to recent antihistamine use","deferred due to insurance stipulations that require a separate visit for testing","labs sent instead"," "}    {Blank single:19197::"Allergy testing results were read and interpreted by myself, documented by clinical staff."," "}      Malachi Bonds, MD  [facility]

## 2024-03-21 NOTE — Telephone Encounter (Signed)
 Spoke with patient, informed her that application was sent in and I apologized for the delay. Patient was understanding and was very appreciative for everyone who has help with this matter. Patient was also given the number to [employer] patient assistance program and she will call if she doesn't hear back from them within 72 hours.

## 2024-03-21 NOTE — Patient Instructions (Addendum)
 1. Moderate persistent asthma, uncomplicated - Lung testing looked a bit better today. - Another sample of Trelegy provided. - I will try to figure out the free Trelegy program.  - Consider Tezspire or Harrington Challenger for long-term control to keep you off of prednisone. - We will get another chest X-ray to follow up on the interstitial lung disease.     - Daily controller medication(s): Trelegy one puff once daily + Singulair (montelukast) 10mg  daily - Prior to physical activity: albuterol 2 puffs 10-15 minutes before physical activity. - Rescue medications: albuterol 4 puffs every 4-6 hours as needed - Asthma control goals:  * Full participation in all desired activities (may need albuterol before activity) * Albuterol use two time or less a week on average (not counting use with activity) * Cough interfering with sleep two time or less a month * Oral steroids no more than once a year * No hospitalizations  2. 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 - Continue with Mucinex as needed.   3. Return in about 2 months (around 05/21/2024). Get the chest X-ray BEFORE our next appointment! The order is in there.    Call your orthopedic surgeon: Dr. Gean Birchwood: [phone]   Please inform us of any Emergency Department visits, hospitalizations, or changes in symptoms. Call us before going to the ED for breathing or allergy symptoms since we might be able to fit you in for a sick visit. Feel free to contact us anytime with any questions, problems, or concerns.  It was a pleasure to see you again today!  Websites that have reliable patient information: 1. American Academy of Asthma, Allergy, and Immunology: www.aaaai.org 2. Food Allergy Research and Education (FARE): foodallergy.org 3. Mothers of Asthmatics: http://www.asthmacommunitynetwork.org 4. American College of Allergy, Asthma, and Immunology: www.acaai.org      "Like" [place]  on Facebook and Instagram for our latest updates!      A healthy democracy works best when [employer] participate! Make sure you are registered to vote! If you have moved or changed any of your contact information, you will need to get this updated before voting! Scan the QR codes below to learn more!

## 2024-03-21 NOTE — Telephone Encounter (Signed)
 Printed prescription. Apologies for the delay.   Malachi Bonds, MD [facility]

## 2024-03-21 NOTE — Addendum Note (Signed)
 Addended by: Alfonse Spruce on: 03/21/2024 05:23 PM   Modules accepted: Orders

## 2024-03-28 ENCOUNTER — Telehealth: Payer: Self-pay

## 2024-03-28 ENCOUNTER — Encounter: Payer: Self-pay | Admitting: Cardiology

## 2024-03-28 NOTE — Telephone Encounter (Signed)
-----   Message from [facility] sent at 03/27/2024  5:42 PM EDT ----- I think a follow-up CT is reasonable if the x-ray continues to show interstitial changes.  Do you want [place] to order? We will make an appointment in clinic for follow-up. ----- Message ----- From: Alfonse Spruce, MD Sent: 03/23/2024   8:43 AM EDT To: Chilton Greathouse, MD  Hey there! Mary Chase's CXR was funky earlier this year. I am following up to see what it looks like now.  Clinically, she is doing much much better.  If the chest x-ray shows continued interstitial lung disease, I was going to get a high-resolution chest CT.  I think she needs an appointment with you anyway.

## 2024-03-28 NOTE — Telephone Encounter (Signed)
 Spoke to patient and scheduled appt for 05/29/2024, as this was first available.   Routing to Dr. Isaiah Serge as an Lorain Childes. Nothing further needed.

## 2024-04-01 ENCOUNTER — Other Ambulatory Visit: Payer: Self-pay | Admitting: Cardiology

## 2024-04-01 DIAGNOSIS — Z79899 Other long term (current) drug therapy: Secondary | ICD-10-CM

## 2024-04-04 ENCOUNTER — Telehealth: Payer: Self-pay | Admitting: Allergy & Immunology

## 2024-04-04 NOTE — Telephone Encounter (Signed)
 I called the patient to see if she called the number to [employer] patient assistance program she was given by a nurse on 03/21/24. I left a call back number for her to get back with us  about her concerns.

## 2024-04-04 NOTE — Telephone Encounter (Signed)
 Patient called stating she has a prescription for Trelegy at her pharmacy but it costs 300 dollars in order to pick up. Patient is wanting to know what she can do to be able to get her medication.

## 2024-04-08 ENCOUNTER — Other Ambulatory Visit: Payer: Self-pay | Admitting: Allergy & Immunology

## 2024-04-13 ENCOUNTER — Telehealth: Payer: Self-pay

## 2024-04-13 DIAGNOSIS — R0602 Shortness of breath: Secondary | ICD-10-CM

## 2024-04-13 NOTE — Telephone Encounter (Signed)
 Patient called in - DOB verified - requesting Prednisone  - Sxs: some shortness of breath on/off that began on Monday, 04/10/24.   Prednisone  last prescribed: 01/03/24, 02/08/24, 09/28/23  Reviewed provider directions from 03/21/24 office visit:  - Daily controller medication(s): Trelegy 200mcg one puff once daily + Singulair  (montelukast ) 10mg  daily - Prior to physical activity: albuterol  2 puffs 10-15 minutes before physical activity. - Rescue medications: albuterol  4 puffs every 4-6 hours as needed   Patient has not had chest xray done yet.  Patient stated she has been following provider instructions with a little relief - Prednisone  helped her a lot the last time she was prescribed it.  Patient stated she is not able to in - not feeling to well - will go to an UC to be seen and have chest xray done.  Forwarding message to provider as update.

## 2024-04-14 NOTE — Addendum Note (Signed)
 Addended by: Rochester Chuck on: 04/14/2024 04:33 PM   Modules accepted: Orders

## 2024-04-14 NOTE — Telephone Encounter (Signed)
 She definitely needs a CXR. I ordered one for [facility].   I think she definitely should consider a biologic. We have discussed Dupixent in the past, but we could also do Tezspire or Fasenra.  Drexel Gentles, MD [facility] 

## 2024-04-17 NOTE — Telephone Encounter (Signed)
 Pt made aware address sent through MyChart at patients request.

## 2024-04-21 ENCOUNTER — Emergency Department (HOSPITAL_COMMUNITY)

## 2024-04-21 ENCOUNTER — Inpatient Hospital Stay (HOSPITAL_COMMUNITY)
Admission: EM | Admit: 2024-04-21 | Discharge: 2024-04-27 | DRG: 190 | Disposition: A | Discharge status: Home Health | Attending: Internal Medicine | Admitting: Internal Medicine

## 2024-04-21 ENCOUNTER — Encounter (HOSPITAL_COMMUNITY): Payer: Self-pay

## 2024-04-21 ENCOUNTER — Other Ambulatory Visit: Payer: Self-pay

## 2024-04-21 DIAGNOSIS — I350 Nonrheumatic aortic (valve) stenosis: Secondary | ICD-10-CM | POA: Diagnosis not present

## 2024-04-21 DIAGNOSIS — I071 Rheumatic tricuspid insufficiency: Secondary | ICD-10-CM | POA: Diagnosis not present

## 2024-04-21 DIAGNOSIS — Z7951 Long term (current) use of inhaled steroids: Secondary | ICD-10-CM

## 2024-04-21 DIAGNOSIS — Z9889 Other specified postprocedural states: Secondary | ICD-10-CM

## 2024-04-21 DIAGNOSIS — R918 Other nonspecific abnormal finding of lung field: Secondary | ICD-10-CM | POA: Diagnosis not present

## 2024-04-21 DIAGNOSIS — J45901 Unspecified asthma with (acute) exacerbation: Secondary | ICD-10-CM | POA: Diagnosis not present

## 2024-04-21 DIAGNOSIS — I70213 Atherosclerosis of native arteries of extremities with intermittent claudication, bilateral legs: Secondary | ICD-10-CM | POA: Diagnosis not present

## 2024-04-21 DIAGNOSIS — R0902 Hypoxemia: Secondary | ICD-10-CM | POA: Diagnosis not present

## 2024-04-21 DIAGNOSIS — Z96641 Presence of right artificial hip joint: Secondary | ICD-10-CM | POA: Diagnosis not present

## 2024-04-21 DIAGNOSIS — J4541 Moderate persistent asthma with (acute) exacerbation: Secondary | ICD-10-CM | POA: Diagnosis not present

## 2024-04-21 DIAGNOSIS — E7800 Pure hypercholesterolemia, unspecified: Secondary | ICD-10-CM | POA: Diagnosis present

## 2024-04-21 DIAGNOSIS — Z1152 Encounter for screening for COVID-19: Secondary | ICD-10-CM

## 2024-04-21 DIAGNOSIS — E66811 Obesity, class 1: Secondary | ICD-10-CM | POA: Diagnosis not present

## 2024-04-21 DIAGNOSIS — I130 Hypertensive heart and chronic kidney disease with heart failure and stage 1 through stage 4 chronic kidney disease, or unspecified chronic kidney disease: Secondary | ICD-10-CM | POA: Diagnosis present

## 2024-04-21 DIAGNOSIS — R062 Wheezing: Secondary | ICD-10-CM | POA: Diagnosis not present

## 2024-04-21 DIAGNOSIS — R0602 Shortness of breath: Principal | ICD-10-CM

## 2024-04-21 DIAGNOSIS — J441 Chronic obstructive pulmonary disease with (acute) exacerbation: Principal | ICD-10-CM | POA: Diagnosis present

## 2024-04-21 DIAGNOSIS — M25511 Pain in right shoulder: Secondary | ICD-10-CM | POA: Diagnosis present

## 2024-04-21 DIAGNOSIS — K8020 Calculus of gallbladder without cholecystitis without obstruction: Secondary | ICD-10-CM | POA: Diagnosis not present

## 2024-04-21 DIAGNOSIS — J9601 Acute respiratory failure with hypoxia: Secondary | ICD-10-CM | POA: Diagnosis not present

## 2024-04-21 DIAGNOSIS — Z888 Allergy status to other drugs, medicaments and biological substances status: Secondary | ICD-10-CM

## 2024-04-21 DIAGNOSIS — I3139 Other pericardial effusion (noninflammatory): Secondary | ICD-10-CM | POA: Diagnosis not present

## 2024-04-21 DIAGNOSIS — Z7982 Long term (current) use of aspirin: Secondary | ICD-10-CM

## 2024-04-21 DIAGNOSIS — E876 Hypokalemia: Secondary | ICD-10-CM | POA: Diagnosis present

## 2024-04-21 DIAGNOSIS — R7303 Prediabetes: Secondary | ICD-10-CM | POA: Diagnosis present

## 2024-04-21 DIAGNOSIS — I340 Nonrheumatic mitral (valve) insufficiency: Secondary | ICD-10-CM | POA: Diagnosis not present

## 2024-04-21 DIAGNOSIS — J9811 Atelectasis: Secondary | ICD-10-CM | POA: Diagnosis present

## 2024-04-21 DIAGNOSIS — H35039 Hypertensive retinopathy, unspecified eye: Secondary | ICD-10-CM | POA: Diagnosis not present

## 2024-04-21 DIAGNOSIS — I10 Essential (primary) hypertension: Secondary | ICD-10-CM | POA: Diagnosis present

## 2024-04-21 DIAGNOSIS — Z79899 Other long term (current) drug therapy: Secondary | ICD-10-CM

## 2024-04-21 DIAGNOSIS — I2510 Atherosclerotic heart disease of native coronary artery without angina pectoris: Secondary | ICD-10-CM | POA: Diagnosis present

## 2024-04-21 DIAGNOSIS — R059 Cough, unspecified: Secondary | ICD-10-CM | POA: Diagnosis not present

## 2024-04-21 DIAGNOSIS — Z881 Allergy status to other antibiotic agents status: Secondary | ICD-10-CM

## 2024-04-21 DIAGNOSIS — Z9109 Other allergy status, other than to drugs and biological substances: Secondary | ICD-10-CM

## 2024-04-21 DIAGNOSIS — I5033 Acute on chronic diastolic (congestive) heart failure: Secondary | ICD-10-CM | POA: Diagnosis not present

## 2024-04-21 DIAGNOSIS — N179 Acute kidney failure, unspecified: Secondary | ICD-10-CM | POA: Diagnosis not present

## 2024-04-21 DIAGNOSIS — R0989 Other specified symptoms and signs involving the circulatory and respiratory systems: Secondary | ICD-10-CM | POA: Diagnosis not present

## 2024-04-21 DIAGNOSIS — J984 Other disorders of lung: Secondary | ICD-10-CM | POA: Diagnosis not present

## 2024-04-21 DIAGNOSIS — I272 Pulmonary hypertension, unspecified: Secondary | ICD-10-CM | POA: Diagnosis present

## 2024-04-21 DIAGNOSIS — J45909 Unspecified asthma, uncomplicated: Secondary | ICD-10-CM | POA: Diagnosis not present

## 2024-04-21 DIAGNOSIS — R0609 Other forms of dyspnea: Secondary | ICD-10-CM | POA: Diagnosis not present

## 2024-04-21 DIAGNOSIS — M1611 Unilateral primary osteoarthritis, right hip: Secondary | ICD-10-CM | POA: Diagnosis present

## 2024-04-21 DIAGNOSIS — Z6832 Body mass index (BMI) 32.0-32.9, adult: Secondary | ICD-10-CM

## 2024-04-21 DIAGNOSIS — I77810 Thoracic aortic ectasia: Secondary | ICD-10-CM | POA: Diagnosis present

## 2024-04-21 DIAGNOSIS — N1831 Chronic kidney disease, stage 3a: Secondary | ICD-10-CM | POA: Diagnosis present

## 2024-04-21 DIAGNOSIS — Z880 Allergy status to penicillin: Secondary | ICD-10-CM

## 2024-04-21 DIAGNOSIS — J189 Pneumonia, unspecified organism: Principal | ICD-10-CM | POA: Diagnosis present

## 2024-04-21 DIAGNOSIS — Z882 Allergy status to sulfonamides status: Secondary | ICD-10-CM

## 2024-04-21 DIAGNOSIS — D573 Sickle-cell trait: Secondary | ICD-10-CM | POA: Diagnosis present

## 2024-04-21 DIAGNOSIS — M25512 Pain in left shoulder: Secondary | ICD-10-CM | POA: Diagnosis present

## 2024-04-21 DIAGNOSIS — K219 Gastro-esophageal reflux disease without esophagitis: Secondary | ICD-10-CM | POA: Diagnosis present

## 2024-04-21 DIAGNOSIS — M810 Age-related osteoporosis without current pathological fracture: Secondary | ICD-10-CM | POA: Diagnosis present

## 2024-04-21 DIAGNOSIS — Z9849 Cataract extraction status, unspecified eye: Secondary | ICD-10-CM

## 2024-04-21 DIAGNOSIS — Z9851 Tubal ligation status: Secondary | ICD-10-CM

## 2024-04-21 LAB — COMPREHENSIVE METABOLIC PANEL WITH GFR
ALT: 7 U/L (ref 0–44)
AST: 18 U/L (ref 15–41)
Albumin: 3.4 g/dL — ABNORMAL LOW (ref 3.5–5.0)
Alkaline Phosphatase: 73 U/L (ref 38–126)
Anion gap: 11 (ref 5–15)
BUN: 5 mg/dL — ABNORMAL LOW (ref 8–23)
CO2: 23 mmol/L (ref 22–32)
Calcium: 8.9 mg/dL (ref 8.9–10.3)
Chloride: 101 mmol/L (ref 98–111)
Creatinine, Ser: 0.93 mg/dL (ref 0.44–1.00)
GFR, Estimated: 60 mL/min — ABNORMAL LOW (ref >60)
Glucose, Bld: 141 mg/dL — ABNORMAL HIGH (ref 70–99)
Potassium: 2.7 mmol/L — CL (ref 3.5–5.1)
Sodium: 135 mmol/L (ref 135–145)
Total Bilirubin: 1.2 mg/dL (ref 0.0–1.2)
Total Protein: 8.2 g/dL — ABNORMAL HIGH (ref 6.5–8.1)

## 2024-04-21 LAB — CBC WITH DIFFERENTIAL/PLATELET
Abs Immature Granulocytes: 0.03 10*3/uL (ref 0.00–0.07)
Basophils Absolute: 0.1 10*3/uL (ref 0.0–0.1)
Basophils Relative: 1 %
Eosinophils Absolute: 0.6 10*3/uL — ABNORMAL HIGH (ref 0.0–0.5)
Eosinophils Relative: 6 %
HCT: 36.1 % (ref 36.0–46.0)
Hemoglobin: 12.1 g/dL (ref 12.0–15.0)
Immature Granulocytes: 0 %
Lymphocytes Relative: 11 %
Lymphs Abs: 1.1 10*3/uL (ref 0.7–4.0)
MCH: 31.5 pg (ref 26.0–34.0)
MCHC: 33.5 g/dL (ref 30.0–36.0)
MCV: 94 fL (ref 80.0–100.0)
Monocytes Absolute: 0.3 10*3/uL (ref 0.1–1.0)
Monocytes Relative: 3 %
Neutro Abs: 8 10*3/uL — ABNORMAL HIGH (ref 1.7–7.7)
Neutrophils Relative %: 79 %
Platelets: 260 10*3/uL (ref 150–400)
RBC: 3.84 MIL/uL — ABNORMAL LOW (ref 3.87–5.11)
RDW: 13.2 % (ref 11.5–15.5)
WBC: 10.1 10*3/uL (ref 4.0–10.5)
nRBC: 0 % (ref 0.0–0.2)

## 2024-04-21 LAB — BRAIN NATRIURETIC PEPTIDE: B Natriuretic Peptide: 34 pg/mL (ref 0.0–100.0)

## 2024-04-21 MED ORDER — METHYLPREDNISOLONE SODIUM SUCC 125 MG IJ SOLR: 125.0000 mg | Freq: Once | INTRAMUSCULAR | Status: DC

## 2024-04-21 MED ORDER — ALBUTEROL SULFATE (2.5 MG/3ML) 0.083% IN NEBU
2.5000 mg | INHALATION_SOLUTION | Freq: Once | RESPIRATORY_TRACT | Status: AC
  Administered 2024-04-21: 2.5 mg via RESPIRATORY_TRACT
  Filled 2024-04-21: qty 3

## 2024-04-21 MED ORDER — ACETAMINOPHEN 500 MG PO TABS
1000.0000 mg | ORAL_TABLET | Freq: Once | ORAL | Status: AC
  Administered 2024-04-21: 1000 mg via ORAL
  Filled 2024-04-21: qty 2

## 2024-04-21 MED ORDER — POTASSIUM CHLORIDE CRYS ER 20 MEQ PO TBCR
40.0000 meq | EXTENDED_RELEASE_TABLET | Freq: Once | ORAL | Status: AC
  Administered 2024-04-22: 40 meq via ORAL
  Filled 2024-04-21: qty 2

## 2024-04-21 NOTE — ED Provider Notes (Signed)
 Signout from previous provider, please see her note for complete H&P.  This is a 89 year old female with certainly history of asthma and COPD who presented with complaints of progressive worsening shortness of breath ongoing for the past 3 weeks.  She was notable to have wheezing and shortness of breath with cough.  She did receive Solu-Medrol  via EMS prior to arrival.  She also received nebulizing treatment.  Workup thus far is remarkable for potassium of 2.7, will provide supplementation, will check mag lithium level.  Her BNP is normal.  Her white count is normal.  Respiratory panel is currently pending.  Initial chest x-ray showed evidence of cardiomegaly with pulmonary vascular congestion and by basilar atelectasis or infiltrates.  Currently his symptom is not consistent with an infectious etiology.  A chest CT angiogram has been ordered to rule out PE.  Chest CT angiogram showed no evidence of PE or dissection.  There are some ill-defined airspace disease noted on CT suggestive of atelectasis versus pneumonia.  Also evidence of cholelithiasis but no obvious signs of cholecystitis.  On reassessment, patient still has some expiratory wheezes and still is tachycardic.  We will provide additional DuoNeb, will initiate IV antibiotic including Levaquin.  Despite receiving multiple breathing treatments, patient still symptomatic and tests tachycardic.  When ambulating O2 sats at 93% but patient was symptomatic.  I have replenish her hypokalemia as well as hypomagnesemia with supplementation.  I appreciate consultation from [facility] hospitalist Dr. Del Favia who will admit patient for further management of her asthma exacerbation in the setting of pneumonia.  .Critical Care  Performed by: Debbra Fairy, PA-C Authorized by: Debbra Fairy, PA-C   Critical care provider statement:    Critical care time (minutes):  30   Critical care was time spent personally by me on the following activities:  Development of  treatment plan with patient or surrogate, discussions with consultants, evaluation of patient's response to treatment, examination of patient, ordering and review of laboratory studies, ordering and review of radiographic studies, ordering and performing treatments and interventions, pulse oximetry, re-evaluation of patient's condition and review of old charts    BP 133/68 (BP Location: Right Arm)   Pulse (!) 109   Temp 98.7 F (37.1 C) (Oral)   Resp (!) 33   Ht 5' (1.524 m)   Wt 76.6 kg   SpO2 93%   BMI 32.98 kg/m   Results for orders placed or performed during the hospital encounter of 04/21/24  Resp panel by RT-PCR (RSV, Flu A&B, Covid) Anterior Nasal Swab   Collection Time: 04/21/24  9:51 PM   Specimen: Anterior Nasal Swab  Result Value Ref Range   SARS Coronavirus 2 by RT PCR NEGATIVE NEGATIVE   Influenza A by PCR NEGATIVE NEGATIVE   Influenza B by PCR NEGATIVE NEGATIVE   Resp Syncytial Virus by PCR NEGATIVE NEGATIVE  CBC with Differential   Collection Time: 04/21/24 10:40 PM  Result Value Ref Range   WBC 10.1 4.0 - 10.5 K/uL   RBC 3.84 (L) 3.87 - 5.11 MIL/uL   Hemoglobin 12.1 12.0 - 15.0 g/dL   HCT 16.1 09.6 - 04.5 %   MCV 94.0 80.0 - 100.0 fL   MCH 31.5 26.0 - 34.0 pg   MCHC 33.5 30.0 - 36.0 g/dL   RDW 40.9 81.1 - 91.4 %   Platelets 260 150 - 400 K/uL   nRBC 0.0 0.0 - 0.2 %   Neutrophils Relative % 79 %   Neutro Abs 8.0 (H) 1.7 -  7.7 K/uL   Lymphocytes Relative 11 %   Lymphs Abs 1.1 0.7 - 4.0 K/uL   Monocytes Relative 3 %   Monocytes Absolute 0.3 0.1 - 1.0 K/uL   Eosinophils Relative 6 %   Eosinophils Absolute 0.6 (H) 0.0 - 0.5 K/uL   Basophils Relative 1 %   Basophils Absolute 0.1 0.0 - 0.1 K/uL   Immature Granulocytes 0 %   Abs Immature Granulocytes 0.03 0.00 - 0.07 K/uL  Comprehensive metabolic panel   Collection Time: 04/21/24 10:40 PM  Result Value Ref Range   Sodium 135 135 - 145 mmol/L   Potassium 2.7 (LL) 3.5 - 5.1 mmol/L   Chloride 101 98 - 111  mmol/L   CO2 23 22 - 32 mmol/L   Glucose, Bld 141 (H) 70 - 99 mg/dL   BUN 5 (L) 8 - 23 mg/dL   Creatinine, Ser 1.61 0.44 - 1.00 mg/dL   Calcium  8.9 8.9 - 10.3 mg/dL   Total Protein 8.2 (H) 6.5 - 8.1 g/dL   Albumin 3.4 (L) 3.5 - 5.0 g/dL   AST 18 15 - 41 U/L   ALT 7 0 - 44 U/L   Alkaline Phosphatase 73 38 - 126 U/L   Total Bilirubin 1.2 0.0 - 1.2 mg/dL   GFR, Estimated 60 (L) >60 mL/min   Anion gap 11 5 - 15  Brain natriuretic peptide   Collection Time: 04/21/24 10:40 PM  Result Value Ref Range   B Natriuretic Peptide 34.0 0.0 - 100.0 pg/mL  Magnesium    Collection Time: 04/21/24 10:40 PM  Result Value Ref Range   Magnesium  1.3 (L) 1.7 - 2.4 mg/dL   CT Angio Chest PE W and/or Wo Contrast Result Date: 04/22/2024 CLINICAL DATA:  Progressively worsening shortness of breath with abdominal pain, acute, nonlocalized. EXAM: CT ANGIOGRAPHY CHEST CT ABDOMEN AND PELVIS WITH CONTRAST TECHNIQUE: Multidetector CT imaging of the chest was performed using the standard protocol during bolus administration of intravenous contrast. Multiplanar CT image reconstructions and MIPs were obtained to evaluate the vascular anatomy. Multidetector CT imaging of the abdomen and pelvis was performed using the standard protocol during bolus administration of intravenous contrast. RADIATION DOSE REDUCTION: This exam was performed according to the departmental dose-optimization program which includes automated exposure control, adjustment of the mA and/or kV according to patient size and/or use of iterative reconstruction technique. CONTRAST:  75mL OMNIPAQUE  IOHEXOL  350 MG/ML SOLN COMPARISON:  Portable chest yesterday, chest PA Lat 03/03/2024, partial chest CT for coronary CTA 06/29/2023, CT abdomen pelvis with contrast 08/28/2022. FINDINGS: CTA CHEST FINDINGS Cardiovascular: Arterial opacification is diagnostic. No arterial embolus or dilatation is seen. There is mild cardiomegaly with small pericardial effusion again noted.  There are minimal three-vessel coronary calcifications. The pulmonary veins are normal caliber. There is mild aortic tortuosity, calcific plaques in the aortic arch. There is no aneurysm, stenosis or dissection. The great vessels are clear. Mediastinum/Nodes: There are mildly prominent mediastinal and hilar lymph nodes which were present on the prior study and unchanged. There is a subcarinal lymph node measuring 1.3 cm in short axis, left hilar lymph nodes up to 1.2 cm in short axis, right hilar nodes to 1.1 cm. Above the plane of the prior study there is a similar mildly prominent AP window node measuring 1.1 cm. There is no bulky or encasing intrathoracic adenopathy. No enlarged axillary space nodes. There is no thyroid  nodule. Thoracic trachea and main bronchi are clear. Unremarkable thoracic esophagus. Lungs/Pleura: Diffuse bronchial thickening. There is scattered bilateral  posterior basal subsegmental bronchial plugging not seen previously. There is ill-defined airspace disease in a small area in the anterior segment of the right upper lobe, probably due to pneumonia. There is increased consolidation in the dorsal lingular base which could be atelectasis or pneumonia. There are coarse atelectatic changes in both lower lobes. No other focal infiltrates are seen. There is no pleural effusion or visible nodule. Musculoskeletal: Osteopenia and advanced degenerative change thoracic spine, moderately advanced shoulder arthrosis. There are benign calcifications in the left breast. No masses seen in the visualized chest wall. Review of the MIP images confirms the above findings. CT ABDOMEN and PELVIS FINDINGS Hepatobiliary: No focal liver abnormality is seen. There are either several noncalcified stones in the gallbladder versus sludge balls visible. The gallbladder is mildly dilated but without wall thickening or pericholecystic edema. Pancreas: Unremarkable. No pancreatic ductal dilatation or surrounding  inflammatory changes. Spleen: No abnormality. Adrenals/Urinary Tract: Thinning of the bilateral renal cortex appears similar. No adrenal mass. Again noted is a 1 cm Bosniak 1 cyst in the inferior pole of the right kidney, Hounsfield density is -0.8. No follow-up imaging is recommended. There is no renal mass enhancement bilaterally. Perinephric stranding is chronic and unchanged. There is no urinary stone or obstruction. The right side of the bladder is obscured by spray artifact from a right hip arthroplasty. The bladder is unremarkable where visible. Stomach/Bowel: No dilatation or wall thickening, including the appendix. Moderate fecal stasis. Scattered colonic diverticulosis without evidence of diverticulitis. Vascular/Lymphatic: Aortic atherosclerosis. No enlarged abdominal or pelvic lymph nodes. Reproductive: 2.3 cm transmural fibroid again noted right body of uterus. Calcified fibroid in the fundus. Adnexal mass is seen. Other: No abdominal wall hernia or abnormality. No abdominopelvic ascites. Musculoskeletal: Advanced degenerative change lumbar spine with osteopenia. Severe acquired spinal canal stenosis at L2-3, L3-4, L4-5 with multilevel acquired lumbar foraminal stenosis. Right hip arthroplasty. Advanced left hip DJD Review of the MIP images confirms the above findings. IMPRESSION: 1. No evidence of arterial embolus or aortic dissection. 2. Cardiomegaly with small pericardial effusion. 3. Aortic and coronary artery atherosclerosis. 4. Diffuse bronchial thickening with scattered bilateral posterior basal subsegmental bronchial plugging. 5. Ill-defined airspace disease in the anterior segment of the right upper lobe, probably due to pneumonia. 6. Increased consolidation in the dorsal lingular base which could be atelectasis or pneumonia. Short interval follow-up chest CT recommended to ensure clearing. 7. Constipation and diverticulosis. 8. Cholelithiasis versus sludge balls in the gallbladder with mild  gallbladder dilatation but no wall thickening or pericholecystic edema. 9. Fibroid uterus. 10. Osteopenia and degenerative change. Severe acquired spinal canal stenosis at L2-3, L3-4, L4-5 with multilevel acquired lumbar foraminal stenosis. Aortic Atherosclerosis (ICD10-I70.0). Electronically Signed   By: Denman Fischer M.D.   On: 04/22/2024 01:52   CT ABDOMEN PELVIS W CONTRAST Result Date: 04/22/2024 CLINICAL DATA:  Progressively worsening shortness of breath with abdominal pain, acute, nonlocalized. EXAM: CT ANGIOGRAPHY CHEST CT ABDOMEN AND PELVIS WITH CONTRAST TECHNIQUE: Multidetector CT imaging of the chest was performed using the standard protocol during bolus administration of intravenous contrast. Multiplanar CT image reconstructions and MIPs were obtained to evaluate the vascular anatomy. Multidetector CT imaging of the abdomen and pelvis was performed using the standard protocol during bolus administration of intravenous contrast. RADIATION DOSE REDUCTION: This exam was performed according to the departmental dose-optimization program which includes automated exposure control, adjustment of the mA and/or kV according to patient size and/or use of iterative reconstruction technique. CONTRAST:  75mL OMNIPAQUE  IOHEXOL  350 MG/ML SOLN  COMPARISON:  Portable chest yesterday, chest PA Lat 03/03/2024, partial chest CT for coronary CTA 06/29/2023, CT abdomen pelvis with contrast 08/28/2022. FINDINGS: CTA CHEST FINDINGS Cardiovascular: Arterial opacification is diagnostic. No arterial embolus or dilatation is seen. There is mild cardiomegaly with small pericardial effusion again noted. There are minimal three-vessel coronary calcifications. The pulmonary veins are normal caliber. There is mild aortic tortuosity, calcific plaques in the aortic arch. There is no aneurysm, stenosis or dissection. The great vessels are clear. Mediastinum/Nodes: There are mildly prominent mediastinal and hilar lymph nodes which were  present on the prior study and unchanged. There is a subcarinal lymph node measuring 1.3 cm in short axis, left hilar lymph nodes up to 1.2 cm in short axis, right hilar nodes to 1.1 cm. Above the plane of the prior study there is a similar mildly prominent AP window node measuring 1.1 cm. There is no bulky or encasing intrathoracic adenopathy. No enlarged axillary space nodes. There is no thyroid  nodule. Thoracic trachea and main bronchi are clear. Unremarkable thoracic esophagus. Lungs/Pleura: Diffuse bronchial thickening. There is scattered bilateral posterior basal subsegmental bronchial plugging not seen previously. There is ill-defined airspace disease in a small area in the anterior segment of the right upper lobe, probably due to pneumonia. There is increased consolidation in the dorsal lingular base which could be atelectasis or pneumonia. There are coarse atelectatic changes in both lower lobes. No other focal infiltrates are seen. There is no pleural effusion or visible nodule. Musculoskeletal: Osteopenia and advanced degenerative change thoracic spine, moderately advanced shoulder arthrosis. There are benign calcifications in the left breast. No masses seen in the visualized chest wall. Review of the MIP images confirms the above findings. CT ABDOMEN and PELVIS FINDINGS Hepatobiliary: No focal liver abnormality is seen. There are either several noncalcified stones in the gallbladder versus sludge balls visible. The gallbladder is mildly dilated but without wall thickening or pericholecystic edema. Pancreas: Unremarkable. No pancreatic ductal dilatation or surrounding inflammatory changes. Spleen: No abnormality. Adrenals/Urinary Tract: Thinning of the bilateral renal cortex appears similar. No adrenal mass. Again noted is a 1 cm Bosniak 1 cyst in the inferior pole of the right kidney, Hounsfield density is -0.8. No follow-up imaging is recommended. There is no renal mass enhancement bilaterally.  Perinephric stranding is chronic and unchanged. There is no urinary stone or obstruction. The right side of the bladder is obscured by spray artifact from a right hip arthroplasty. The bladder is unremarkable where visible. Stomach/Bowel: No dilatation or wall thickening, including the appendix. Moderate fecal stasis. Scattered colonic diverticulosis without evidence of diverticulitis. Vascular/Lymphatic: Aortic atherosclerosis. No enlarged abdominal or pelvic lymph nodes. Reproductive: 2.3 cm transmural fibroid again noted right body of uterus. Calcified fibroid in the fundus. Adnexal mass is seen. Other: No abdominal wall hernia or abnormality. No abdominopelvic ascites. Musculoskeletal: Advanced degenerative change lumbar spine with osteopenia. Severe acquired spinal canal stenosis at L2-3, L3-4, L4-5 with multilevel acquired lumbar foraminal stenosis. Right hip arthroplasty. Advanced left hip DJD Review of the MIP images confirms the above findings. IMPRESSION: 1. No evidence of arterial embolus or aortic dissection. 2. Cardiomegaly with small pericardial effusion. 3. Aortic and coronary artery atherosclerosis. 4. Diffuse bronchial thickening with scattered bilateral posterior basal subsegmental bronchial plugging. 5. Ill-defined airspace disease in the anterior segment of the right upper lobe, probably due to pneumonia. 6. Increased consolidation in the dorsal lingular base which could be atelectasis or pneumonia. Short interval follow-up chest CT recommended to ensure clearing. 7. Constipation and diverticulosis.  8. Cholelithiasis versus sludge balls in the gallbladder with mild gallbladder dilatation but no wall thickening or pericholecystic edema. 9. Fibroid uterus. 10. Osteopenia and degenerative change. Severe acquired spinal canal stenosis at L2-3, L3-4, L4-5 with multilevel acquired lumbar foraminal stenosis. Aortic Atherosclerosis (ICD10-I70.0). Electronically Signed   By: Denman Fischer M.D.   On:  04/22/2024 01:52   DG Chest Port 1 View Result Date: 04/21/2024 CLINICAL DATA:  Cough, asthma, right-sided pain EXAM: PORTABLE CHEST 1 VIEW COMPARISON:  03/03/2024 FINDINGS: Stable cardiomegaly. Pulmonary vascular congestion. Bibasilar atelectasis or infiltrates. No pleural effusion or pneumothorax. Advanced arthritis both shoulders. IMPRESSION: 1. Cardiomegaly with pulmonary vascular congestion. 2. Bibasilar atelectasis or infiltrates. Electronically Signed   By: Rozell Cornet M.D.   On: 04/21/2024 22:09       Debbra Fairy, PA-C 04/22/24 1610    Eve Hinders, MD 04/22/24 [phone]

## 2024-04-21 NOTE — ED Notes (Signed)
 Called and placed PT on monitor with CCMD.

## 2024-04-21 NOTE — ED Provider Notes (Signed)
 [facility] [place] [facility] AT [facility] Provider Note   CSN: [id number] Arrival date & time: 04/21/24  2114     History  Chief Complaint  Patient presents with   Shortness of Breath   HPI  Mary Chase is a 89 y.o. female with PMHx asthma/COPD, CAD, GERD, HTN presents with progressively worsening shortness of breath.  Patient states that over the last 3 weeks, she has had increased wheezing and shortness of breath with cough.  She has been trying her nebulizer treatments with minimal relief.  Denies any fevers.  Of note, she also has been having right-sided flank pain over the last week and a half.  Denies any dysuria or abdominal pain.  Denies any hematuria.  Denies any history of kidney stones. Of note, EMS administered 125 mg of Solu-Medrol  as well as nebulizer treatments and route.   Shortness of Breath      Home Medications Prior to Admission medications   Medication Sig Start Date End Date Taking? Authorizing Provider  acetaminophen  (TYLENOL ) 500 MG tablet Take 500 mg by mouth every 6 (six) hours as needed for moderate pain.    [provider]  albuterol  (PROVENTIL ) (2.5 MG/3ML) 0.083% nebulizer solution TAKE 3 ML (2.5 MG TOTAL) BY NEBULIZATION EVERY 4 HOURS AS NEEDED FOR WHEEZING OR SHORTNESS OF BREATH 02/14/24   Rochester Chuck, MD  albuterol  (VENTOLIN  HFA) 108 (90 Base) MCG/ACT inhaler INHALE 4 PUFFS INTO THE LUNGS EVERY 4 (FOUR) HOURS AS NEEDED FOR WHEEZING OR SHORTNESS OF BREATH. 04/10/24   Rochester Chuck, MD  amLODipine  (NORVASC ) 10 MG tablet Take 10 mg by mouth daily. 11/23/22   [provider]  aspirin  EC 81 MG tablet Take 1 tablet (81 mg total) by mouth 2 (two) times daily. 09/16/20   Sheron Dixons, PA-C  Cholecalciferol (VITAMIN D3) 25 MCG (1000 UT) CAPS Take 1,000 Units by mouth daily in the afternoon.    [provider]  Cyanocobalamin (B-12) 5000 MCG CAPS Take 5,000 mcg by mouth daily.    [provider]  fluticasone  (FLONASE ) 50 MCG/ACT nasal spray PLACE 1 SPRAY INTO BOTH NOSTRILS DAILY AS NEEDED FOR ALLERGIES OR RHINITIS. 01/03/24   Rochester Chuck, MD  Fluticasone -Umeclidin-Vilant (TRELEGY ELLIPTA ) 200-62.5-25 MCG/ACT AEPB Inhale 1 puff into the lungs daily. 03/21/24   Rochester Chuck, MD  furosemide  (LASIX ) 20 MG tablet Take 1 tablet (20 mg total) by mouth every other day. 06/22/23   Marlyse Single T, PA-C  ipratropium-albuterol  (DUONEB) 0.5-2.5 (3) MG/3ML SOLN Take 3 mLs by nebulization every 4 (four) hours as needed. 07/06/23   Daren Eck, DO  losartan  (COZAAR ) 50 MG tablet Take 1 tablet (50 mg total) by mouth daily. 05/11/23   Elgergawy, Ardia Kraft, MD  montelukast  (SINGULAIR ) 10 MG tablet Take 1 tablet (10 mg total) by mouth daily. 09/10/22   Rochester Chuck, MD  rosuvastatin  (CRESTOR ) 5 MG tablet TAKE 1 TABLET BY MOUTH EVERY OTHER DAY 04/04/24   Jacqueline Matsu, MD      Allergies    Penicillins, Alendronate sodium, Cephalexin, Sulfa antibiotics, Sulfacetamide sodium, and Other    Review of Systems   Review of Systems  Respiratory:  Positive for shortness of breath.     Physical Exam Updated Vital Signs BP 130/75 (BP Location: Right Arm)   Pulse (!) 105   Temp 97.7 F (36.5 C)   Resp (!) 23   Ht 5' (1.524 m)   Wt 76.6 kg  SpO2 100%   BMI 32.98 kg/m  Physical Exam Vitals and nursing note reviewed.  Constitutional:      General: She is not in acute distress.    Appearance: She is well-developed.  HENT:     Head: Normocephalic and atraumatic.     Right Ear: External ear normal.     Left Ear: External ear normal.     Nose: Nose normal.     Mouth/Throat:     Mouth: Mucous membranes are moist.  Eyes:     Conjunctiva/sclera: Conjunctivae normal.  Cardiovascular:     Rate and Rhythm: Normal rate and regular rhythm.     Heart sounds: No murmur heard. Pulmonary:     Effort: Pulmonary effort is normal.     Breath sounds: Normal breath sounds.      Comments: Mild end expiratory wheezing with no crackles or rhonchi Abdominal:     Palpations: Abdomen is soft.     Tenderness: There is no abdominal tenderness. There is no right CVA tenderness, left CVA tenderness, guarding or rebound.  Musculoskeletal:        General: No swelling.     Cervical back: Neck supple.     Right lower leg: No edema.     Left lower leg: No edema.     Comments: Reproducible pain to palpation to the thoracic right lateral back  Skin:    General: Skin is warm and dry.     Capillary Refill: Capillary refill takes less than 2 seconds.  Neurological:     Mental Status: She is alert.  Psychiatric:        Mood and Affect: Mood normal.     ED Results / Procedures / Treatments   Labs (all labs ordered are listed, but only abnormal results are displayed) Labs Reviewed  CBC WITH DIFFERENTIAL/PLATELET - Abnormal; Notable for the following components:      Result Value   RBC 3.84 (*)    Neutro Abs 8.0 (*)    Eosinophils Absolute 0.6 (*)    All other components within normal limits  RESP PANEL BY RT-PCR (RSV, FLU A&B, COVID)  [id number]  BRAIN NATRIURETIC PEPTIDE  COMPREHENSIVE METABOLIC PANEL WITH GFR  URINALYSIS, W/ REFLEX TO CULTURE (INFECTION SUSPECTED)  CBC WITH DIFFERENTIAL/PLATELET    EKG None  Radiology DG Chest Port 1 View Result Date: 04/21/2024 CLINICAL DATA:  Cough, asthma, right-sided pain EXAM: PORTABLE CHEST 1 VIEW COMPARISON:  03/03/2024 FINDINGS: Stable cardiomegaly. Pulmonary vascular congestion. Bibasilar atelectasis or infiltrates. No pleural effusion or pneumothorax. Advanced arthritis both shoulders. IMPRESSION: 1. Cardiomegaly with pulmonary vascular congestion. 2. Bibasilar atelectasis or infiltrates. Electronically Signed   By: Rozell Cornet M.D.   On: 04/21/2024 22:09    Procedures Procedures    Medications Ordered in ED Medications  albuterol  (PROVENTIL ) (2.5 MG/3ML) 0.083% nebulizer solution 2.5 mg (2.5 mg Nebulization Given  04/21/24 2222)  acetaminophen  (TYLENOL ) tablet 1,000 mg (1,000 mg Oral Given 04/21/24 2217)    ED Course/ Medical Decision Making/ A&P                                 Medical Decision Making Amount and/or Complexity of Data Reviewed Labs: ordered. Radiology: ordered.  Risk OTC drugs. Prescription drug management.   Patient is alert, afebrile, and hemodynamically stable in no acute distress.  Physical exam as noted above.  Differential includes asthma/COPD exacerbation, CHF, PE, ureterolithiasis, pneumonia, amongst other diagnoses.  I  ordered patient additional nebulizer treatment of albuterol  as well as Tylenol  for pain control.  Labs, EKG, urine, CT PE study of the chest as well as CT abdomen pelvis was ordered.  I personally interpreted patient's chest x-ray, which demonstrated bilateral lower lobe opacities.  Remainder of workup is pending at this time.  Patient care was handed off to [facility].  Please see his note for the remainder of patient's care.  Patient seen in conjunction with Dr. Delana Favors, who agreed with the above work-up and plan of care.        Final Clinical Impression(s) / ED Diagnoses Final diagnoses:  Shortness of breath    Rx / DC Orders ED Discharge Orders     None         Lorain Robson, MD 04/21/24 2347    Dalene Duck, MD 04/25/24 1524

## 2024-04-21 NOTE — ED Provider Notes (Incomplete)
 Pt here with sob for several weeks likely asthma exacerbation.  Chest CTA ordered to r/o PE.  If negative, anticipate going home with prednisone .    Signout from previous provider, please see her note for complete H&P.  This is a 89 year old female with certainly history of asthma and COPD who presented with complaints of progressive worsening shortness of breath ongoing for the past 3 weeks.  She was notable to have wheezing and shortness of breath with cough.  She did receive Solu-Medrol  via EMS prior to arrival.  She also received nebulizing treatment.  Workup thus far is remarkable for potassium of 2.7, will provide supplementation, will check mag lithium level.  Her BNP is normal.  Her white count is normal.  Respiratory panel is currently pending.  Initial chest x-ray showed evidence of cardiomegaly with pulmonary vascular congestion and by basilar atelectasis or infiltrates.  Currently his symptom is not consistent with an infectious etiology.  A chest CT angiogram has been ordered to rule out PE.

## 2024-04-21 NOTE — ED Triage Notes (Signed)
 PT BIB [facility] from home after having increasing SOB.PT's nebulizer treatments have become less effective. Wheezing and rhonci heard in all lung fields but improved after EMS gave 2 duo neb and 125 solu-medrol . PT is also c/o a cough with green phlegm and bilateral \\flank  pain when she breathes or coughs.

## 2024-04-22 ENCOUNTER — Emergency Department (HOSPITAL_COMMUNITY)

## 2024-04-22 DIAGNOSIS — I3139 Other pericardial effusion (noninflammatory): Secondary | ICD-10-CM | POA: Diagnosis not present

## 2024-04-22 DIAGNOSIS — R918 Other nonspecific abnormal finding of lung field: Secondary | ICD-10-CM | POA: Diagnosis not present

## 2024-04-22 DIAGNOSIS — I350 Nonrheumatic aortic (valve) stenosis: Secondary | ICD-10-CM

## 2024-04-22 DIAGNOSIS — J45901 Unspecified asthma with (acute) exacerbation: Secondary | ICD-10-CM | POA: Diagnosis present

## 2024-04-22 DIAGNOSIS — N179 Acute kidney failure, unspecified: Secondary | ICD-10-CM | POA: Diagnosis not present

## 2024-04-22 DIAGNOSIS — M1611 Unilateral primary osteoarthritis, right hip: Secondary | ICD-10-CM

## 2024-04-22 DIAGNOSIS — K8020 Calculus of gallbladder without cholecystitis without obstruction: Secondary | ICD-10-CM | POA: Diagnosis present

## 2024-04-22 DIAGNOSIS — I10 Essential (primary) hypertension: Secondary | ICD-10-CM

## 2024-04-22 DIAGNOSIS — J441 Chronic obstructive pulmonary disease with (acute) exacerbation: Secondary | ICD-10-CM

## 2024-04-22 DIAGNOSIS — Z1152 Encounter for screening for COVID-19: Secondary | ICD-10-CM | POA: Diagnosis not present

## 2024-04-22 DIAGNOSIS — I340 Nonrheumatic mitral (valve) insufficiency: Secondary | ICD-10-CM | POA: Diagnosis present

## 2024-04-22 DIAGNOSIS — Z6832 Body mass index (BMI) 32.0-32.9, adult: Secondary | ICD-10-CM | POA: Diagnosis not present

## 2024-04-22 DIAGNOSIS — E66811 Obesity, class 1: Secondary | ICD-10-CM

## 2024-04-22 DIAGNOSIS — I2510 Atherosclerotic heart disease of native coronary artery without angina pectoris: Secondary | ICD-10-CM

## 2024-04-22 DIAGNOSIS — N1831 Chronic kidney disease, stage 3a: Secondary | ICD-10-CM | POA: Diagnosis not present

## 2024-04-22 DIAGNOSIS — I5033 Acute on chronic diastolic (congestive) heart failure: Secondary | ICD-10-CM | POA: Diagnosis not present

## 2024-04-22 DIAGNOSIS — H35039 Hypertensive retinopathy, unspecified eye: Secondary | ICD-10-CM | POA: Diagnosis present

## 2024-04-22 DIAGNOSIS — E7800 Pure hypercholesterolemia, unspecified: Secondary | ICD-10-CM | POA: Diagnosis present

## 2024-04-22 DIAGNOSIS — I071 Rheumatic tricuspid insufficiency: Secondary | ICD-10-CM | POA: Diagnosis present

## 2024-04-22 DIAGNOSIS — I70213 Atherosclerosis of native arteries of extremities with intermittent claudication, bilateral legs: Secondary | ICD-10-CM | POA: Diagnosis present

## 2024-04-22 DIAGNOSIS — J9811 Atelectasis: Secondary | ICD-10-CM | POA: Diagnosis present

## 2024-04-22 DIAGNOSIS — J984 Other disorders of lung: Secondary | ICD-10-CM | POA: Diagnosis not present

## 2024-04-22 DIAGNOSIS — I130 Hypertensive heart and chronic kidney disease with heart failure and stage 1 through stage 4 chronic kidney disease, or unspecified chronic kidney disease: Secondary | ICD-10-CM | POA: Diagnosis present

## 2024-04-22 DIAGNOSIS — E876 Hypokalemia: Secondary | ICD-10-CM | POA: Diagnosis present

## 2024-04-22 DIAGNOSIS — J9601 Acute respiratory failure with hypoxia: Secondary | ICD-10-CM | POA: Diagnosis present

## 2024-04-22 DIAGNOSIS — I272 Pulmonary hypertension, unspecified: Secondary | ICD-10-CM | POA: Diagnosis present

## 2024-04-22 DIAGNOSIS — Z96641 Presence of right artificial hip joint: Secondary | ICD-10-CM | POA: Diagnosis present

## 2024-04-22 DIAGNOSIS — R0602 Shortness of breath: Secondary | ICD-10-CM | POA: Diagnosis present

## 2024-04-22 DIAGNOSIS — R0609 Other forms of dyspnea: Secondary | ICD-10-CM | POA: Diagnosis not present

## 2024-04-22 LAB — URINALYSIS, W/ REFLEX TO CULTURE (INFECTION SUSPECTED)
Bacteria, UA: NONE SEEN
Bilirubin Urine: NEGATIVE
Glucose, UA: NEGATIVE mg/dL
Ketones, ur: NEGATIVE mg/dL
Leukocytes,Ua: NEGATIVE
Nitrite: NEGATIVE
Protein, ur: 100 mg/dL — AB
Specific Gravity, Urine: >1.046 — ABNORMAL HIGH (ref 1.005–1.030)
pH: 5 (ref 5.0–8.0)

## 2024-04-22 LAB — BASIC METABOLIC PANEL WITH GFR
Anion gap: 12 (ref 5–15)
BUN: 13 mg/dL (ref 8–23)
CO2: 24 mmol/L (ref 22–32)
Calcium: 9.3 mg/dL (ref 8.9–10.3)
Chloride: 101 mmol/L (ref 98–111)
Creatinine, Ser: 1.24 mg/dL — ABNORMAL HIGH (ref 0.44–1.00)
GFR, Estimated: 42 mL/min — ABNORMAL LOW
Glucose, Bld: 185 mg/dL — ABNORMAL HIGH (ref 70–99)
Potassium: 3.5 mmol/L (ref 3.5–5.1)
Sodium: 137 mmol/L (ref 135–145)

## 2024-04-22 LAB — MAGNESIUM
Magnesium: 1.3 mg/dL — ABNORMAL LOW (ref 1.7–2.4)
Magnesium: 2 mg/dL (ref 1.7–2.4)

## 2024-04-22 LAB — RESP PANEL BY RT-PCR (RSV, FLU A&B, COVID)  RVPGX2
Influenza A by PCR: NEGATIVE
Influenza B by PCR: NEGATIVE
Resp Syncytial Virus by PCR: NEGATIVE
SARS Coronavirus 2 by RT PCR: NEGATIVE

## 2024-04-22 MED ORDER — AZITHROMYCIN 500 MG PO TABS
500.0000 mg | ORAL_TABLET | Freq: Every day | ORAL | Status: DC
  Administered 2024-04-23 – 2024-04-27 (×5): 500 mg via ORAL
  Filled 2024-04-22 (×5): qty 1

## 2024-04-22 MED ORDER — POTASSIUM CHLORIDE 10 MEQ/100ML IV SOLN: 10.0000 meq | INTRAVENOUS | Status: AC

## 2024-04-22 MED ORDER — ONDANSETRON HCL 4 MG/2ML IJ SOLN: 4.0000 mg | Freq: Four times a day (QID) | INTRAMUSCULAR | Status: DC | PRN

## 2024-04-22 MED ORDER — ALBUTEROL SULFATE (2.5 MG/3ML) 0.083% IN NEBU
2.5000 mg | INHALATION_SOLUTION | RESPIRATORY_TRACT | Status: DC | PRN
  Administered 2024-04-22 – 2024-04-26 (×4): 2.5 mg via RESPIRATORY_TRACT
  Filled 2024-04-22 (×4): qty 3

## 2024-04-22 MED ORDER — LEVOFLOXACIN IN D5W 750 MG/150ML IV SOLN
750.0000 mg | Freq: Once | INTRAVENOUS | Status: AC
  Administered 2024-04-22: 750 mg via INTRAVENOUS
  Filled 2024-04-22: qty 150

## 2024-04-22 MED ORDER — BUDESONIDE 0.25 MG/2ML IN SUSP
0.2500 mg | Freq: Two times a day (BID) | RESPIRATORY_TRACT | Status: DC
  Administered 2024-04-22 – 2024-04-27 (×10): 0.25 mg via RESPIRATORY_TRACT
  Filled 2024-04-22 (×10): qty 2

## 2024-04-22 MED ORDER — ALBUTEROL SULFATE (2.5 MG/3ML) 0.083% IN NEBU
2.5000 mg | INHALATION_SOLUTION | Freq: Once | RESPIRATORY_TRACT | Status: AC
  Administered 2024-04-22: 2.5 mg via RESPIRATORY_TRACT
  Filled 2024-04-22: qty 3

## 2024-04-22 MED ORDER — ACETAMINOPHEN 650 MG RE SUPP: 650.0000 mg | Freq: Four times a day (QID) | RECTAL | Status: DC | PRN

## 2024-04-22 MED ORDER — MONTELUKAST SODIUM 10 MG PO TABS
10.0000 mg | ORAL_TABLET | Freq: Every day | ORAL | Status: DC
  Administered 2024-04-22 – 2024-04-27 (×6): 10 mg via ORAL
  Filled 2024-04-22 (×6): qty 1

## 2024-04-22 MED ORDER — ROSUVASTATIN CALCIUM 5 MG PO TABS
5.0000 mg | ORAL_TABLET | ORAL | Status: DC
  Administered 2024-04-22 – 2024-04-26 (×3): 5 mg via ORAL
  Filled 2024-04-22 (×5): qty 1

## 2024-04-22 MED ORDER — ENOXAPARIN SODIUM 40 MG/0.4ML IJ SOSY: 40.0000 mg | PREFILLED_SYRINGE | INTRAMUSCULAR | Status: DC

## 2024-04-22 MED ORDER — MAGNESIUM SULFATE 2 GM/50ML IV SOLN
2.0000 g | Freq: Once | INTRAVENOUS | Status: AC
  Administered 2024-04-22: 2 g via INTRAVENOUS
  Filled 2024-04-22: qty 50

## 2024-04-22 MED ORDER — ASPIRIN 81 MG PO TBEC
81.0000 mg | DELAYED_RELEASE_TABLET | Freq: Two times a day (BID) | ORAL | Status: DC
  Administered 2024-04-22 – 2024-04-27 (×11): 81 mg via ORAL
  Filled 2024-04-22 (×11): qty 1

## 2024-04-22 MED ORDER — POTASSIUM CHLORIDE CRYS ER 20 MEQ PO TBCR
40.0000 meq | EXTENDED_RELEASE_TABLET | Freq: Once | ORAL | Status: AC
  Administered 2024-04-22: 40 meq via ORAL
  Filled 2024-04-22: qty 2

## 2024-04-22 MED ORDER — ONDANSETRON HCL 4 MG PO TABS
4.0000 mg | ORAL_TABLET | Freq: Four times a day (QID) | ORAL | Status: DC | PRN
  Filled 2024-04-22: qty 1

## 2024-04-22 MED ORDER — IPRATROPIUM-ALBUTEROL 0.5-2.5 (3) MG/3ML IN SOLN
3.0000 mL | Freq: Four times a day (QID) | RESPIRATORY_TRACT | Status: DC
  Administered 2024-04-22 – 2024-04-26 (×16): 3 mL via RESPIRATORY_TRACT
  Filled 2024-04-22 (×16): qty 3

## 2024-04-22 MED ORDER — IOHEXOL 350 MG/ML SOLN
75.0000 mL | Freq: Once | INTRAVENOUS | Status: AC | PRN
  Administered 2024-04-22: 75 mL via INTRAVENOUS

## 2024-04-22 MED ORDER — AMLODIPINE BESYLATE 10 MG PO TABS
10.0000 mg | ORAL_TABLET | Freq: Every day | ORAL | Status: DC
  Administered 2024-04-22 – 2024-04-27 (×6): 10 mg via ORAL
  Filled 2024-04-22 (×6): qty 1

## 2024-04-22 MED ORDER — LOSARTAN POTASSIUM 50 MG PO TABS
50.0000 mg | ORAL_TABLET | Freq: Every day | ORAL | Status: DC
  Administered 2024-04-22 – 2024-04-27 (×6): 50 mg via ORAL
  Filled 2024-04-22 (×6): qty 1

## 2024-04-22 MED ORDER — METHYLPREDNISOLONE SODIUM SUCC 40 MG IJ SOLR
40.0000 mg | Freq: Two times a day (BID) | INTRAMUSCULAR | Status: DC
  Administered 2024-04-22 – 2024-04-26 (×10): 40 mg via INTRAVENOUS
  Filled 2024-04-22 (×9): qty 1

## 2024-04-22 MED ORDER — ACETAMINOPHEN 325 MG PO TABS: 650.0000 mg | ORAL_TABLET | Freq: Four times a day (QID) | ORAL | Status: DC | PRN

## 2024-04-22 MED ORDER — ALBUTEROL SULFATE (2.5 MG/3ML) 0.083% IN NEBU
10.0000 mg/h | INHALATION_SOLUTION | Freq: Once | RESPIRATORY_TRACT | Status: AC
  Administered 2024-04-22: 10 mg/h via RESPIRATORY_TRACT
  Filled 2024-04-22: qty 12

## 2024-04-22 NOTE — Assessment & Plan Note (Addendum)
 Asthma/COPD exacerbation.  Bronchodilator therapy with duoneb every 6 hrs and albuterol  q 2 hrs prn Inhaled corticosteroid therapy with bid budesonide Systemic corticosteroids with methylprednisolone  40 mg IV bid Airway clearing techniques with flutter valve and incentive spirometer.  Out of bed to chair tid with meals., PT and OT.  Continue oxymetry monitoring and supplemental 02 per [place] to keep 02 saturation 88% or greater.  Antibiotic therapy with Azithromycin po for 5 days.    Patient with improved ventilation and wheezing today.

## 2024-04-22 NOTE — Assessment & Plan Note (Signed)
 No chest pain, no acute coronary syndrome Continue with statin therapy, patient not on aspirin 

## 2024-04-22 NOTE — Assessment & Plan Note (Signed)
 Hypokalemia and hypomagnesemia AKI  Today renal function with serum cr at 1,17 with K at 4,7 and serum bicarbonate at 24  Na 137 and Mg 2.2   Plan to continue close follow up renal function and electrolytes.  Resume loop diuretic today

## 2024-04-22 NOTE — Assessment & Plan Note (Signed)
Calculated BMI is 32.9  

## 2024-04-22 NOTE — Evaluation (Signed)
 Physical Therapy Evaluation Patient Details Name: Mary Chase MRN: [id number] DOB: [birth year] Today's Date: 04/22/2024  History of Present Illness  89 y.o. female presents to [facility] 04/21/24 for worsening dyspnea, coughing, and wheezing. Admitted with COPD/asthma exacerbation. Chest x-ray showed evidence of cardiomegaly with pulmonary vascular congestion and by basilar atelectasis or infiltrates. PMHx: asthma/ COPD, aortic stenosis, CAD, CKD stage 3a,  dyslipidemia and sickle call trait, R hip OA, cataract, mitral regurgitation   Clinical Impression  Pt in bed upon arrival and agreeable to PT eval. PTA, pt was ModI with a rollator for short distances. In today's session, pt was limited by feeling SOB with VSS on RA. She was agreeable to sit on the EOB with supervision needed for safety. Noticeable increased WOB with pt declining further transfer at this time. Pt was agreeable to ambulate and work on stair negotiation in future sessions. Pt has intermittent assist available from family upon d/c home. Pt currently with functional limitations due to the deficits listed below (see PT Problem List). Pt would benefit from acute skilled PT to address functional impairments. Pt declines post-acute PT services. Acute PT to follow.         If plan is discharge home, recommend the following: A little help with walking and/or transfers;A little help with bathing/dressing/bathroom;Assist for transportation;Help with stairs or ramp for entrance;Assistance with cooking/housework   Can travel by private vehicle    Yes    Equipment Recommendations None recommended by PT     Functional Status Assessment Patient has had a recent decline in their functional status and demonstrates the ability to make significant improvements in function in a reasonable and predictable amount of time.     Precautions / Restrictions Precautions Precautions: Fall Restrictions Weight Bearing Restrictions Per Provider Order: No       Mobility  Bed Mobility Overal bed mobility: Needs Assistance Bed Mobility: Supine to Sit, Sit to Supine    Supine to sit: Supervision, HOB elevated Sit to supine: Supervision, HOB elevated   General bed mobility comments: supervision for safety    Transfers    General transfer comment: pt declined      Balance Overall balance assessment: Needs assistance Sitting-balance support: No upper extremity supported, Feet supported Sitting balance-Leahy Scale: Fair Sitting balance - Comments: Able to sit for ~10 min while answering questions        Pertinent Vitals/Pain Pain Assessment Pain Assessment: Faces Faces Pain Scale: Hurts little more Pain Location: chest Pain Descriptors / Indicators: Aching, Discomfort Pain Intervention(s): Limited activity within patient's tolerance, Monitored during session, Repositioned    Home Living Family/patient expects to be discharged to:: Private residence Living Arrangements: Children (son) Available Help at Discharge: Family;Available PRN/intermittently Type of Home: House Home Access: Stairs to enter Entrance Stairs-Rails: Right;Left;Can reach both Entrance Stairs-Number of Steps: 4   Home Layout: One level Home Equipment: Grab bars - tub/shower;BSC/3in1;Rolling Walker (2 wheels);Rollator (4 wheels);Wheelchair - manual (has upright RW)      Prior Function Prior Level of Function : Needs assist      Mobility Comments: ModI with rollator for short distances due to feeling SOB ADLs Comments: will get into the tub if she has family close. Independent with ADLs     Extremity/Trunk Assessment   Upper Extremity Assessment Upper Extremity Assessment: Defer to OT evaluation    Lower Extremity Assessment Lower Extremity Assessment: Generalized weakness (grossly 4/5)    Cervical / Trunk Assessment Cervical / Trunk Assessment: Normal  Communication   Communication Communication:  No apparent difficulties    Cognition  Arousal: Alert Behavior During Therapy: WFL for tasks assessed/performed   PT - Cognitive impairments: No apparent impairments    Following commands: Intact       Cueing Cueing Techniques: Verbal cues     General Comments General comments (skin integrity, edema, etc.): VSS on RA, felt SOB with increased WOB     PT Assessment Patient needs continued PT services  PT Problem List Decreased strength;Decreased balance;Decreased activity tolerance;Decreased mobility;Cardiopulmonary status limiting activity       PT Treatment Interventions DME instruction;Gait training;Functional mobility training;Stair training;Therapeutic activities;Balance training;Therapeutic exercise;Neuromuscular re-education;Patient/family education    PT Goals (Current goals can be found in the Care Plan section)  Acute Rehab PT Goals Patient Stated Goal: to get better PT Goal Formulation: With patient Time For Goal Achievement: 05/06/24 Potential to Achieve Goals: Good    Frequency Min 2X/week        AM-PAC PT "6 Clicks" Mobility  Outcome Measure Help needed turning from your back to your side while in a flat bed without using bedrails?: A Little Help needed moving from lying on your back to sitting on the side of a flat bed without using bedrails?: A Little Help needed moving to and from a bed to a chair (including a wheelchair)?: A Little Help needed standing up from a chair using your arms (e.g., wheelchair or bedside chair)?: A Little Help needed to walk in hospital room?: A Little Help needed climbing 3-5 steps with a railing? : A Lot 6 Click Score: 17    End of Session   Activity Tolerance: Other (comment) (felt SOB) Patient left: in bed;with call bell/phone within reach Nurse Communication: Mobility status PT Visit Diagnosis: Other abnormalities of gait and mobility (R26.89);Muscle weakness (generalized) (M62.81)    Time: 1340-1407 PT Time Calculation (min) (ACUTE ONLY): 27  min   Charges:   PT Evaluation $PT Eval Low Complexity: 1 Low PT Treatments $Therapeutic Activity: 8-22 mins PT General Charges $$ ACUTE PT VISIT: 1 Visit        Orysia Blas, PT, DPT Secure Chat Preferred  Rehab Office [phone]   Alissa April Adela Ades 04/22/2024, 2:21 PM

## 2024-04-22 NOTE — H&P (Signed)
 History and Physical    Patient: Mary Chase ZOX:[id number] DOB: 10/22/1935 DOA: 04/21/2024 DOS: the patient was seen and examined on 04/22/2024 PCP: Arva Lathe, MD  Patient coming from: Home  Chief Complaint:  Chief Complaint  Patient presents with   Shortness of Breath   HPI: SHERITTA FIEST is a 89 y.o. female with medical history significant of asthma/ COPD, aortic stenosis, coronary artery disease, CKD,  dyslipidemia and sickle call trait who presented with dyspnea.  Reported worsening dyspnea for the last 3 weeks, initially on exertion and then progress to having symptoms with minimal efforts. Dyspnea was associated with increased mucous production, coughing and wheezing. No fever or chills, no nausea or vomiting.  No sick contacts.  No PND, orthopnea or lower extremity edema.  She has been using her nebulized and inhaled medications at home with no improvement in her symptoms.  On the day of admission her daughter noted her very deconditioned and having increased work of breathing. EMS was called she was found in respiratory distress. She got bronchodilator and was transported to the ED.   At home she uses a walker for ambulation and has no supplemental 02.   Review of Systems: As mentioned in the history of present illness. All other systems reviewed and are negative. Past Medical History:  Diagnosis Date   Age related osteoporosis    Allergic rhinitis    Aortic stenosis    Mild by echo 05/2023   Arthritis    Ascending aorta dilatation (HCC)    2D echo 06/10/2023 with ascending aorta measurement 40 mm   Asthma    Atherosclerosis of native artery of both lower extremities with intermittent claudication (HCC)    CAD (coronary artery disease), native coronary artery    coronary Ca score of 452 with <25% stenosis of prox to mid LAD and 25-49% prox RCA by coronary CTA 7/24   Cataract    Chronic kidney disease, stage 3a (HCC)    COPD (chronic obstructive  pulmonary disease) (HCC)    Degenerative joint disease (DJD) of hip    Dyspnea    with exertion   GERD (gastroesophageal reflux disease)    Headache    Hypercholesterolemia    Hypertension    Hypertensive retinopathy    Mitral regurgitation    Moderate to severe by echo 05/2023   Osteoarthritis of hip    Pneumonia    Pre-diabetes    A1c within normal limits last check   Pulmonary hypertension (HCC)    Mild to moderate with PASP 44 mmHg by echo 6-24   PVD (peripheral vascular disease) (HCC)    Sickle cell anemia (HCC)    trait   Sickle cell trait (HCC)    Tricuspid regurgitation    Moderate by echo 06/10/2023   Past Surgical History:  Procedure Laterality Date   ANKLE SURGERY Right 2002   pin   BREAST BIOPSY Right 11/01/2013   Procedure: RIGHT BREAST MASS EXCISION;  Surgeon: Enid Harry, MD;  Location: [facility] OR;  Service: General;  Laterality: Right;   CATARACT EXTRACTION     ELBOW SURGERY Right    pin   MULTIPLE TOOTH EXTRACTIONS     TEE WITHOUT CARDIOVERSION N/A 08/03/2023   Procedure: TRANSESOPHAGEAL ECHOCARDIOGRAM;  Surgeon: Harrold Lincoln, MD;  Location[facility] INVASIVE CV LAB;  Service: Cardiovascular;  Laterality: N/A;   TOTAL HIP ARTHROPLASTY Right 09/16/2020   Procedure: RIGHT TOTAL HIP ARTHROPLASTY ANTERIOR APPROACH;  Surgeon: Wendolyn Hamburger, MD;  Location: [facility] ORS;  Service: Orthopedics;  Laterality: Right;   TUBAL LIGATION     Social History:  reports that she has never smoked. She has never used smokeless tobacco. She reports current alcohol use. She reports that she does not use drugs.  Allergies  Allergen Reactions   Penicillins Shortness Of Breath and Rash   Alendronate Sodium     hair loss, muscle aches, SOB,weight issues   Cephalexin Itching   Sulfa Antibiotics Itching and Other (See Comments)    Swelling in mouth   Sulfacetamide Sodium Itching   Other Itching    Some beans     History reviewed. No pertinent family history.  Prior to Admission  medications   Medication Sig Start Date End Date Taking? Authorizing Provider  acetaminophen  (TYLENOL ) 500 MG tablet Take 500 mg by mouth every 6 (six) hours as needed for moderate pain.    [provider]  albuterol  (PROVENTIL ) (2.5 MG/3ML) 0.083% nebulizer solution TAKE 3 ML (2.5 MG TOTAL) BY NEBULIZATION EVERY 4 HOURS AS NEEDED FOR WHEEZING OR SHORTNESS OF BREATH 02/14/24   Rochester Chuck, MD  albuterol  (VENTOLIN  HFA) 108 (90 Base) MCG/ACT inhaler INHALE 4 PUFFS INTO THE LUNGS EVERY 4 (FOUR) HOURS AS NEEDED FOR WHEEZING OR SHORTNESS OF BREATH. 04/10/24   Rochester Chuck, MD  amLODipine  (NORVASC ) 10 MG tablet Take 10 mg by mouth daily. 11/23/22   [provider]  aspirin  EC 81 MG tablet Take 1 tablet (81 mg total) by mouth 2 (two) times daily. 09/16/20   Sheron Dixons, PA-C  Cholecalciferol (VITAMIN D3) 25 MCG (1000 UT) CAPS Take 1,000 Units by mouth daily in the afternoon.    [provider]  Cyanocobalamin (B-12) 5000 MCG CAPS Take 5,000 mcg by mouth daily.    [provider]  fluticasone  (FLONASE ) 50 MCG/ACT nasal spray PLACE 1 SPRAY INTO BOTH NOSTRILS DAILY AS NEEDED FOR ALLERGIES OR RHINITIS. 01/03/24   Rochester Chuck, MD  Fluticasone -Umeclidin-Vilant (TRELEGY ELLIPTA ) 200-62.5-25 MCG/ACT AEPB Inhale 1 puff into the lungs daily. 03/21/24   Rochester Chuck, MD  furosemide  (LASIX ) 20 MG tablet Take 1 tablet (20 mg total) by mouth every other day. 06/22/23   Marlyse Single T, PA-C  ipratropium-albuterol  (DUONEB) 0.5-2.5 (3) MG/3ML SOLN Take 3 mLs by nebulization every 4 (four) hours as needed. 07/06/23   Daren Eck, DO  losartan  (COZAAR ) 50 MG tablet Take 1 tablet (50 mg total) by mouth daily. 05/11/23   Elgergawy, Ardia Kraft, MD  montelukast  (SINGULAIR ) 10 MG tablet Take 1 tablet (10 mg total) by mouth daily. 09/10/22   Rochester Chuck, MD  rosuvastatin  (CRESTOR ) 5 MG tablet TAKE 1 TABLET BY MOUTH EVERY OTHER DAY 04/04/24   Jacqueline Matsu, MD    Physical Exam: Vitals:   04/22/24 7829 04/22/24 0544 04/22/24 0631 04/22/24 0742  BP:   121/69 117/69  Pulse:   (!) 108 (!) 105  Resp:    17  Temp:   98.8 F (37.1 C) 99.1 F (37.3 C)  TempSrc:   Oral Oral  SpO2: 93% 93% 95% 91%  Weight:      Height:       Neurology awake and alert ENT with mild pallor Cardiovascular with S1 and S2 present and regular with no gallops, rubs or murmurs No JVD Respiratory with positive inspiratory and expiratory wheezing, positive bilateral rhonchi, with no significant rales. Positive increased work of breathing and speaking in short sentences.  No accessory muscle use Abdomen with no distention  No lower extremity edema   Data Reviewed:   Na 135, K 2,7 Cl 101, bicarbonate 23 glucose 141 bun 5 cr 0,93  Mg 1.3  Wbc 10,1 hgb 12.1 plt 260  Sars covid 19 negative Influenza negative  RSV negative  Urine analysis SG > 1.046 protein 100, negative leukocytes, negative hgb   Chest radiograph with cardiomegaly, with mild hilar vascular congestion and bibasilar atelectasis.  CT chest/ abdomen/ pelvis, no evidence of arterial embolus or aortic dissection. Cardiomegaly with small pericardial effusion.  Diffuse bronchial thickening with scattered bilateral posterior basal subsegmental bronchi plugging.  Ill defined airspace disease in the anterior segment of the right upper lobe.  Cholelithiasis.   Assessment and Plan: COPD exacerbation (HCC) Asthma exacerbation.  Plan to continue aggressive bronchodilator therapy with duoneb every 6 hrs and albuterol  q 2 hrs prn Inhaled corticosteroid therapy with bid budesonide Systemic corticosteroids with methylprednisolone  40 mg IV bid Airway clearing techniques with flutter valve and incentive spirometer.  Out of bed to chair tid with meals., PT and OT.  Continue oxymetry monitoring and supplemental 02 per [place] to keep 02 saturation 88% or greater.  Antibiotic therapy with Azithromycin, per imaging  review I doubt bacterial pneumonia, will hold on ceftriaxone for now.   HTN (hypertension) Continue blood pressure control with amlodipine  and losartan .   CAD (coronary artery disease), native coronary artery No chest pain, no acute coronary syndrome Continue with statin therapy, patient not on aspirin    CKD stage 3a, GFR 45-59 ml/min (HCC) Hypokalemia and hypomagnesemia   Renal function is stable today.   Plan to recheck electrolytes today, target K at 4 and Mg at 2.   Aortic stenosis No signs of acute heart failure Continue blood pressure control with losartan  and amlodipine .  Will hold on furosemide  for now, patient is euvolemic.   Osteoarthritis of right hip PT and OT   Obesity, class 1 Calculated BMI is 32.9       Advance Care Planning:   Code Status: Full Code   Consults: none   Family Communication: no family at the bedside   Severity of Illness: The appropriate patient status for this patient is OBSERVATION. Observation status is judged to be reasonable and necessary in order to provide the required intensity of service to ensure the patient's safety. The patient's presenting symptoms, physical exam findings, and initial radiographic and laboratory data in the context of their medical condition is felt to place them at decreased risk for further clinical deterioration. Furthermore, it is anticipated that the patient will be medically stable for discharge from the hospital within 2 midnights of admission.   Author: Albertus Alt, MD 04/22/2024 8:20 AM  For on call review [email or web address].

## 2024-04-22 NOTE — Assessment & Plan Note (Signed)
Continue blood pressure control with amlodipine and losartan Hold on furosemide for now.

## 2024-04-22 NOTE — Assessment & Plan Note (Signed)
-   PT and OT

## 2024-04-22 NOTE — Assessment & Plan Note (Signed)
 Today patient with persistent dyspnea, despite bronchodilator therapy.  She has moderate JVD  Plan for one dose of IV furosemide  40 mg and will recheck echocardiogram.  Continue blood pressure control with amlodipine  and losartan .

## 2024-04-23 DIAGNOSIS — I2510 Atherosclerotic heart disease of native coronary artery without angina pectoris: Secondary | ICD-10-CM | POA: Diagnosis not present

## 2024-04-23 DIAGNOSIS — I5033 Acute on chronic diastolic (congestive) heart failure: Secondary | ICD-10-CM

## 2024-04-23 DIAGNOSIS — J441 Chronic obstructive pulmonary disease with (acute) exacerbation: Secondary | ICD-10-CM | POA: Diagnosis not present

## 2024-04-23 DIAGNOSIS — N1831 Chronic kidney disease, stage 3a: Secondary | ICD-10-CM | POA: Diagnosis not present

## 2024-04-23 DIAGNOSIS — I10 Essential (primary) hypertension: Secondary | ICD-10-CM | POA: Diagnosis not present

## 2024-04-23 LAB — BASIC METABOLIC PANEL WITH GFR
Anion gap: 11 (ref 5–15)
BUN: 16 mg/dL (ref 8–23)
CO2: 24 mmol/L (ref 22–32)
Calcium: 9.5 mg/dL (ref 8.9–10.3)
Chloride: 104 mmol/L (ref 98–111)
Creatinine, Ser: 1.19 mg/dL — ABNORMAL HIGH (ref 0.44–1.00)
GFR, Estimated: 45 mL/min — ABNORMAL LOW (ref >60)
Glucose, Bld: 151 mg/dL — ABNORMAL HIGH (ref 70–99)
Potassium: 4.4 mmol/L (ref 3.5–5.1)
Sodium: 139 mmol/L (ref 135–145)

## 2024-04-23 LAB — MAGNESIUM: Magnesium: 2.2 mg/dL (ref 1.7–2.4)

## 2024-04-23 MED ORDER — FUROSEMIDE 10 MG/ML IJ SOLN
40.0000 mg | Freq: Once | INTRAMUSCULAR | Status: AC
  Administered 2024-04-23: 40 mg via INTRAVENOUS
  Filled 2024-04-23: qty 4

## 2024-04-23 MED ORDER — GUAIFENESIN ER 600 MG PO TB12
600.0000 mg | ORAL_TABLET | Freq: Two times a day (BID) | ORAL | Status: DC
  Administered 2024-04-23 – 2024-04-27 (×9): 600 mg via ORAL
  Filled 2024-04-23 (×9): qty 1

## 2024-04-23 MED ORDER — POLYETHYLENE GLYCOL 3350 17 G PO PACK
17.0000 g | PACK | Freq: Every day | ORAL | Status: DC
  Administered 2024-04-23 – 2024-04-26 (×4): 17 g via ORAL
  Filled 2024-04-23 (×4): qty 1

## 2024-04-23 NOTE — Hospital Course (Signed)
 Mrs. Mary Chase was admitted to the hospital with the working diagnosis of COPD exacerbation.   89 y.o. female with medical history significant of asthma/ COPD, aortic stenosis, coronary artery disease, CKD,  dyslipidemia and sickle call trait who presented with dyspnea.  Reported worsening dyspnea for the last 3 weeks, initially on exertion and then progress to having symptoms with minimal efforts. Dyspnea was associated with increased mucous production, coughing and wheezing. On her initial physical examination her blood pressure was 121/69, HR 105 and RR 17, 02 saturation 91%  Cardiovascular with S1 and S2 present and regular with no gallops, rubs or murmurs. No JVD. Respiratory with positive inspiratory and expiratory wheezing, positive bilateral rhonchi, with no significant rales. Positive increased work of breathing and speaking in short sentences. No accessory muscle use. Abdomen with no distention. No lower extremity edema   Na 135, K 2,7 Cl 101, bicarbonate 23 glucose 141 bun 5 cr 0,93  Mg 1.3  Wbc 10,1 hgb 12.1 plt 260  Sars covid 19 negative Influenza negative  RSV negative  Urine analysis SG > 1.046 protein 100, negative leukocytes, negative hgb    Chest radiograph with cardiomegaly, with mild hilar vascular congestion and bibasilar atelectasis.  CT chest/ abdomen/ pelvis, no evidence of arterial embolus or aortic dissection. Cardiomegaly with small pericardial effusion.  Diffuse bronchial thickening with scattered bilateral posterior basal subsegmental bronchi plugging.  Ill defined airspace disease in the anterior segment of the right upper lobe.  Cholelithiasis.   05/04 patient was placed on steroids and bronchodilator therapy. One dose of IV furosemide  due to persistent dyspnea.  05/05 dyspnea has been improved but not back to her baseline.  05/06 clinically improving but not yet back to baseline, resume oral loop diuretic

## 2024-04-23 NOTE — Progress Notes (Signed)
 Progress Note   Patient: Mary Chase ZOX:[id number] DOB: 1935-04-02 DOA: 04/21/2024     1 DOS: the patient was seen and examined on 04/23/2024   Brief hospital course: Mrs. Atchley was admitted to the hospital with the working diagnosis of COPD exacerbation.   89 y.o. female with medical history significant of asthma/ COPD, aortic stenosis, coronary artery disease, CKD,  dyslipidemia and sickle call trait who presented with dyspnea.  Reported worsening dyspnea for the last 3 weeks, initially on exertion and then progress to having symptoms with minimal efforts. Dyspnea was associated with increased mucous production, coughing and wheezing. On her initial physical examination her blood pressure was 121/69, HR 105 and RR 17, 02 saturation 91%  Cardiovascular with S1 and S2 present and regular with no gallops, rubs or murmurs. No JVD. Respiratory with positive inspiratory and expiratory wheezing, positive bilateral rhonchi, with no significant rales. Positive increased work of breathing and speaking in short sentences. No accessory muscle use. Abdomen with no distention. No lower extremity edema   Na 135, K 2,7 Cl 101, bicarbonate 23 glucose 141 bun 5 cr 0,93  Mg 1.3  Wbc 10,1 hgb 12.1 plt 260  Sars covid 19 negative Influenza negative  RSV negative  Urine analysis SG > 1.046 protein 100, negative leukocytes, negative hgb    Chest radiograph with cardiomegaly, with mild hilar vascular congestion and bibasilar atelectasis.  CT chest/ abdomen/ pelvis, no evidence of arterial embolus or aortic dissection. Cardiomegaly with small pericardial effusion.  Diffuse bronchial thickening with scattered bilateral posterior basal subsegmental bronchi plugging.  Ill defined airspace disease in the anterior segment of the right upper lobe.  Cholelithiasis.   05/04 patient was placed on steroids and bronchodilator therapy. One dose of IV furosemide  due to persistent dyspnea.   Assessment and Plan: * COPD  exacerbation (HCC) Asthma/COPD exacerbation.  Bronchodilator therapy with duoneb every 6 hrs and albuterol  q 2 hrs prn Inhaled corticosteroid therapy with bid budesonide Systemic corticosteroids with methylprednisolone  40 mg IV bid Airway clearing techniques with flutter valve and incentive spirometer.  Out of bed to chair tid with meals., PT and OT.  Continue oxymetry monitoring and supplemental 02 per [place] to keep 02 saturation 88% or greater.  Antibiotic therapy with Azithromycin po for 5 days.    HTN (hypertension) Continue blood pressure control with amlodipine  and losartan .   CAD (coronary artery disease), native coronary artery No chest pain, no acute coronary syndrome Continue with statin therapy, patient not on aspirin    CKD stage 3a, GFR 45-59 ml/min (HCC) Hypokalemia and hypomagnesemia AKI  Follow up renal function and electrolytes with serum cr at 1,19 with K at 4.4 and serum bicarbonate at 24  Na 139 and Mg 2.2   Plan to continue close follow up renal function and electrolytes.   Acute on chronic diastolic CHF (congestive heart failure) (HCC) Today patient with persistent dyspnea, despite bronchodilator therapy.  She has moderate JVD  Plan for one dose of IV furosemide  40 mg and will recheck echocardiogram.  Continue blood pressure control with amlodipine  and losartan .   Osteoarthritis of right hip PT and OT   Obesity, class 1 Calculated BMI is 32.9         Subjective: Patient continue to have significant dyspnea today with no improvement since yesterday. No chest pain.   Physical Exam: Vitals:   04/23/24 0245 04/23/24 0437 04/23/24 0727 04/23/24 0910  BP:  130/88 137/80   Pulse:   (!) 112   Resp:  16 18   Temp:  98.7 F (37.1 C)    TempSrc:  Oral    SpO2: 92% 93% 100% 99%  Weight:      Height:       Neurology awake and alert ENT with mild pallor Cardiovascular with S1 and S2 present and regular with no gallops, rubs or murmurs Moderate JVD No  lower extremity edema Respiratory with bilateral rales and wheezing with no rhonchi, mild increased work of breathing  Abdomen with no distention  Data Reviewed:    Family Communication: no family at the bedside   Disposition: Status is: Inpatient Remains inpatient appropriate because: respiratory failure   Planned Discharge Destination: Home     Author: Albertus Alt, MD 04/23/2024 9:50 AM  For on call review [email or web address].

## 2024-04-23 NOTE — Progress Notes (Signed)
 Occupational Therapy Evaluation Patient Details Name: Mary Chase MRN: [id number] DOB: 01-04-1935 Today's Date: 04/23/2024   History of Present Illness   89 y.o. female presents to [facility] 04/21/24 for worsening dyspnea, coughing, and wheezing. Admitted with COPD/asthma exacerbation. Chest x-ray showed evidence of cardiomegaly with pulmonary vascular congestion and by basilar atelectasis or infiltrates. PMHx: asthma/ COPD, aortic stenosis, CAD, CKD stage 3a,  dyslipidemia and sickle call trait, R hip OA, cataract, mitral regurgitation     Clinical Impressions Prior to this admission, patient living with her son, and able to ambulate with her rollator. Patient independent with ADLs, but only showers when someone is close and has needed more assist recently with IADLs. Currently, patient on 3L O2 with HR at 113 at rest. Patient min A for ADL management (lower body dressing) but able to ambulate to bathroom at [facility] with VSS on 3L. Patient requiring prolonged seated rest break, with HR noted to be as a high at 128. Patient would benefit from [facility], but patient is declining services. OT will continue to follow acutely to work on deficits listed.     If plan is discharge home, recommend the following:   A little help with walking and/or transfers;A little help with bathing/dressing/bathroom;Assistance with cooking/housework;Assist for transportation;Help with stairs or ramp for entrance     Functional Status Assessment   Patient has had a recent decline in their functional status and demonstrates the ability to make significant improvements in function in a reasonable and predictable amount of time.     Equipment Recommendations   None recommended by OT     Recommendations for Other Services         Precautions/Restrictions   Precautions Precautions: Fall Restrictions Weight Bearing Restrictions Per Provider Order: No     Mobility Bed Mobility Overal bed mobility: Needs  Assistance             General bed mobility comments: up in recliner upon arrival    Transfers Overall transfer level: Needs assistance Equipment used: Rolling walker (2 wheels) Transfers: Sit to/from Stand             General transfer comment: [facility] for safety and O2 line management      Balance Overall balance assessment: Needs assistance Sitting-balance support: No upper extremity supported, Feet supported Sitting balance-Leahy Scale: Fair     Standing balance support: Bilateral upper extremity supported, Reliant on assistive device for balance, During functional activity Standing balance-Leahy Scale: Poor Standing balance comment: leans over RW                           ADL either performed or assessed with clinical judgement   ADL Overall ADL's : Needs assistance/impaired Eating/Feeding: Set up;Sitting   Grooming: Wash/dry hands;Wash/dry face;Set up;Sitting   Upper Body Bathing: Set up;Sitting   Lower Body Bathing: Minimal assistance;Moderate assistance;Sit to/from stand;Sitting/lateral leans   Upper Body Dressing : Set up;Sitting   Lower Body Dressing: Moderate assistance;Minimal assistance;Sit to/from stand;Sitting/lateral leans   Toilet Transfer: Contact guard assist;Ambulation;Regular Toilet;Rolling walker (2 wheels)   Toileting- Clothing Manipulation and Hygiene: Set up;Sit to/from stand;Sitting/lateral lean       Functional mobility during ADLs: Minimal assistance;Cueing for sequencing;Cueing for safety General ADL Comments: Prior to this admission, patient living with her son, and able to ambulate with her rollator. Patient independent with ADLs, but only showers when someone is close and has needed more assist recently with IADLs. Currently, patient on 3L  O2 with HR at 113 at rest. Patient min A for ADL management (lower body dressing) but able to ambulate to bathroom at [facility] with VSS on 3L. Patient requiring prolonged seated rest break,  with HR noted to be as a high at 128. Patient would benefit from [facility], but patient is declining services. OT will continue to follow acutely to work on deficits listed.     Vision Baseline Vision/History: 1 Wears glasses Ability to See in Adequate Light: 0 Adequate Patient Visual Report: No change from baseline Vision Assessment?: Wears glasses for reading;No apparent visual deficits     Perception Perception: Not tested       Praxis Praxis: Not tested       Pertinent Vitals/Pain Pain Assessment Pain Assessment: Faces Faces Pain Scale: Hurts a little bit Pain Location: hoarse and breathing is more difficult, VSS Pain Descriptors / Indicators: Aching, Discomfort Pain Intervention(s): Limited activity within patient's tolerance, Monitored during session, Repositioned     Extremity/Trunk Assessment Upper Extremity Assessment Upper Extremity Assessment: Overall WFL for tasks assessed;Right hand dominant   Lower Extremity Assessment Lower Extremity Assessment: Defer to PT evaluation   Cervical / Trunk Assessment Cervical / Trunk Assessment: Normal   Communication Communication Communication: No apparent difficulties   Cognition Arousal: Alert Behavior During Therapy: WFL for tasks assessed/performed Cognition: No apparent impairments                               Following commands: Intact       Cueing  General Comments   Cueing Techniques: Verbal cues  increased WOB with minimal ambulation on 3L   Exercises     Shoulder Instructions      Home Living Family/patient expects to be discharged to:: Private residence Living Arrangements: Children (son) Available Help at Discharge: Family;Available PRN/intermittently Type of Home: House Home Access: Stairs to enter [employer] of Steps: 4 Entrance Stairs-Rails: Right;Left;Can reach both Home Layout: One level     Bathroom Shower/Tub: Tub/shower unit (jacuzzi tub)   Bathroom Toilet:  Standard Bathroom Accessibility: Yes   Home Equipment: Grab bars - tub/shower;BSC/3in1;Rolling Walker (2 wheels);Rollator (4 wheels);Wheelchair - manual (has upright RW)          Prior Functioning/Environment Prior Level of Function : Needs assist             Mobility Comments: ModI with rollator for short distances due to feeling SOB ADLs Comments: will get into the tub if she has family close. Independent with ADLs, manages her own medications    OT Problem List: Decreased activity tolerance;Impaired balance (sitting and/or standing);Decreased strength;Cardiopulmonary status limiting activity   OT Treatment/Interventions: Self-care/ADL training;Therapeutic exercise;Energy conservation;DME and/or AE instruction;Manual therapy;Therapeutic activities;Patient/family education;Balance training      OT Goals(Current goals can be found in the care plan section)   Acute Rehab OT Goals Patient Stated Goal: to get better OT Goal Formulation: With patient Time For Goal Achievement: 05/07/24 Potential to Achieve Goals: Good   OT Frequency:  Min 2X/week    Co-evaluation              AM-PAC OT "6 Clicks" Daily Activity     Outcome Measure Help from another person eating meals?: A Little Help from another person taking care of personal grooming?: A Little Help from another person toileting, which includes using toliet, bedpan, or urinal?: A Little Help from another person bathing (including washing, rinsing, drying)?: A Lot Help from another  person to put on and taking off regular upper body clothing?: A Little Help from another person to put on and taking off regular lower body clothing?: A Lot 6 Click Score: 16   End of Session Equipment Utilized During Treatment: Rolling walker (2 wheels);Oxygen Nurse Communication: Mobility status  Activity Tolerance: Patient tolerated treatment well Patient left: in chair;with call bell/phone within reach  OT Visit Diagnosis:  Unsteadiness on feet (R26.81);Other abnormalities of gait and mobility (R26.89);Muscle weakness (generalized) (M62.81)                Time: 4696-2952 OT Time Calculation (min): 30 min Charges:  OT General Charges $OT Visit: 1 Visit OT Evaluation $OT Eval Moderate Complexity: 1 Mod OT Treatments $Self Care/Home Management : 8-22 mins  Mollie Anger E. Taya Ashbaugh, OTR/L Acute Rehabilitation Services [phone]   Vincent Greek 04/23/2024, 12:55 PM

## 2024-04-24 ENCOUNTER — Inpatient Hospital Stay (HOSPITAL_COMMUNITY)

## 2024-04-24 DIAGNOSIS — R0609 Other forms of dyspnea: Secondary | ICD-10-CM | POA: Diagnosis not present

## 2024-04-24 DIAGNOSIS — N1831 Chronic kidney disease, stage 3a: Secondary | ICD-10-CM | POA: Diagnosis not present

## 2024-04-24 DIAGNOSIS — I10 Essential (primary) hypertension: Secondary | ICD-10-CM | POA: Diagnosis not present

## 2024-04-24 DIAGNOSIS — J441 Chronic obstructive pulmonary disease with (acute) exacerbation: Secondary | ICD-10-CM | POA: Diagnosis not present

## 2024-04-24 DIAGNOSIS — I2510 Atherosclerotic heart disease of native coronary artery without angina pectoris: Secondary | ICD-10-CM | POA: Diagnosis not present

## 2024-04-24 LAB — BASIC METABOLIC PANEL WITH GFR
Anion gap: 9 (ref 5–15)
BUN: 24 mg/dL — ABNORMAL HIGH (ref 8–23)
CO2: 24 mmol/L (ref 22–32)
Calcium: 9.1 mg/dL (ref 8.9–10.3)
Chloride: 103 mmol/L (ref 98–111)
Creatinine, Ser: 1.27 mg/dL — ABNORMAL HIGH (ref 0.44–1.00)
GFR, Estimated: 41 mL/min — ABNORMAL LOW (ref >60)
Glucose, Bld: 161 mg/dL — ABNORMAL HIGH (ref 70–99)
Potassium: 4.7 mmol/L (ref 3.5–5.1)
Sodium: 136 mmol/L (ref 135–145)

## 2024-04-24 LAB — ECHOCARDIOGRAM COMPLETE
AR max vel: 1.04 cm2
AV Area VTI: 1.01 cm2
AV Area mean vel: 0.97 cm2
AV Mean grad: 16 mmHg
AV Peak grad: 28.1 mmHg
Ao pk vel: 2.65 m/s
Area-P 1/2: 5.52 cm2
Height: 60 in
MV M vel: 5.16 m/s
MV Peak grad: 106.5 mmHg
S' Lateral: 2 cm
Weight: 2701.96 [oz_av]

## 2024-04-24 LAB — MAGNESIUM: Magnesium: 2.2 mg/dL (ref 1.7–2.4)

## 2024-04-24 MED ORDER — DICLOFENAC SODIUM 1 % EX GEL
2.0000 g | Freq: Four times a day (QID) | CUTANEOUS | Status: DC
Start: 2024-04-24 — End: 2024-04-27
  Administered 2024-04-24 – 2024-04-26 (×11): 2 g via TOPICAL
  Filled 2024-04-24: qty 100

## 2024-04-24 NOTE — Progress Notes (Signed)
  Echocardiogram 2D Echocardiogram has been performed.  Mary Chase 04/24/2024, 1:52 PM

## 2024-04-24 NOTE — Progress Notes (Signed)
 Mobility Specialist: Progress Note   04/24/24 1109  Mobility  Activity Ambulated with assistance in room  Level of Assistance Standby assist, set-up cues, supervision of patient - no hands on  Assistive Device Front wheel walker  Distance Ambulated (ft) 5 ft  Activity Response Tolerated fair  Mobility Referral Yes  Mobility visit 1 Mobility  Mobility Specialist Start Time (ACUTE ONLY) [id number]  Mobility Specialist Stop Time (ACUTE ONLY) [id number]  Mobility Specialist Time Calculation (min) (ACUTE ONLY) 15 min    During Mobility: SpO2 98-99% 3LO2, HR 127  Pt was agreeable to mobility session after max encouragement - received in chair. Only agreeable to take a few steps forward and backward despite admittedly being able to go further distances such as the BR and back with little to no assist. SV throughout. Feeling SOB and fatigued once returning to the chair. SpO2 98-99% 3LO2. Also demonstrated chair level exercises (seated marches, knee kicks, toe taps) without assist. Left in chair with all needs met, call bell in reach.   Deloria Fetch Mobility Specialist Please contact via SecureChat or Rehab office at [phone]

## 2024-04-24 NOTE — Plan of Care (Signed)

## 2024-04-24 NOTE — Progress Notes (Signed)
 Physical Therapy Treatment Patient Details Name: Mary Chase MRN: [id number] DOB: 1935/05/09 Today's Date: 04/24/2024   History of Present Illness 89 y.o. female presents to [facility] 04/21/24 for worsening dyspnea, coughing, and wheezing. Admitted with COPD/asthma exacerbation. Chest x-ray showed evidence of cardiomegaly with pulmonary vascular congestion and by basilar atelectasis or infiltrates. PMHx: asthma/ COPD, aortic stenosis, CAD, CKD stage 3a, dyslipidemia and sickle call trait, R hip OA, cataract, mitral regurgitation    PT Comments  Pt received in supine, c/o nausea and constipation, but reluctant to participate in OOB mobility. SpO2 WFL at rest on RA but HR elevated at rest ~115 bpm, pt reports she sat up in chair earlier in the day. PTA discussed benefits of mobility/risks of immobility with pt, including benefits of OOB to improve intestinal motility so she can have BM, however pt defers due to time of day and states "at home I make my own schedule". Pt agreeable to PTA coming by sometime between 10am-2pm following day to work on stair negotiation and gait, pt requesting +2 staff for safety with stair training due to fear of falls. Pt instructed on IS use with demo back ~643mL and pt encouraged to continue supine LE ROM/exercises to reduce risk of DVT when in bed. Pt continues to benefit from PT services to progress toward functional mobility goals.     If plan is discharge home, recommend the following: A little help with walking and/or transfers;A little help with bathing/dressing/bathroom;Assist for transportation;Help with stairs or ramp for entrance;Assistance with cooking/housework   Can travel by private vehicle        Equipment Recommendations  None recommended by PT    Recommendations for Other Services       Precautions / Restrictions Precautions Precautions: Fall Recall of Precautions/Restrictions: Impaired Precaution/Restrictions Comments: Decreased insight risks of  immobility/benefits of mobility; watch O2 Restrictions Weight Bearing Restrictions Per Provider Order: No     Mobility  Bed Mobility Overal bed mobility: Needs Assistance             General bed mobility comments: pt refusing    Transfers Overall transfer level: Needs assistance                 General transfer comment: pt refusing    Ambulation/Gait                   Stairs             Wheelchair Mobility     Tilt Bed    Modified Rankin (Stroke Patients Only)       Balance Overall balance assessment: Needs assistance     Sitting balance - Comments: pt refusing       Standing balance comment: pt refusing                            Communication Communication Communication: No apparent difficulties  Cognition Arousal: Alert Behavior During Therapy: WFL for tasks assessed/performed   PT - Cognitive impairments: No apparent impairments                       PT - Cognition Comments: reluctant to participate; pt also verbalizes fear of falls while refusing EOB/OOB. Following commands: Intact      Cueing Cueing Techniques: Verbal cues  Exercises Other Exercises Other Exercises: IS for teachback pt achieves 600 mL, encouraged hourly use Other Exercises: Discussion on supine HEP including heel slides  ankle pumps, and IS use; pt receptive Other Exercises: pt defers to attempt transfer to EOB for seated LE therex    General Comments General comments (skin integrity, edema, etc.): SpO2 WFL on RA at rest, 93-95%; HR tachy at rest ~114 bpm      Pertinent Vitals/Pain Pain Assessment Pain Assessment: Faces Faces Pain Scale: Hurts a little bit Pain Location: hoarse, heartburn/nausea Pain Descriptors / Indicators: Discomfort, Sore, Grimacing, Guarding Pain Intervention(s): Limited activity within patient's tolerance, Monitored during session, Other (comment) (pt refusing OOB; hand is on her abdomen)    Home  Living                          Prior Function            PT Goals (current goals can now be found in the care plan section) Acute Rehab PT Goals Patient Stated Goal: To go home PT Goal Formulation: With patient Time For Goal Achievement: 05/06/24 Progress towards PT goals: Not progressing toward goals - comment (refusing to participate in OOB or EOB)    Frequency    Min 2X/week      PT Plan      Co-evaluation              AM-PAC PT "6 Clicks" Mobility   Outcome Measure  Help needed turning from your back to your side while in a flat bed without using bedrails?: A Little Help needed moving from lying on your back to sitting on the side of a flat bed without using bedrails?: A Little Help needed moving to and from a bed to a chair (including a wheelchair)?: A Little Help needed standing up from a chair using your arms (e.g., wheelchair or bedside chair)?: A Little Help needed to walk in hospital room?: A Little Help needed climbing 3-5 steps with a railing? : Total (pt refusing to attempt today) 6 Click Score: 16    End of Session   Activity Tolerance: Other (comment) (pt self-limiting and c/o nausea, discussion on benefits of standing to help with c/o constipation but pt not amenable to EOB/OOB) Patient left: in bed;with call bell/phone within reach;with bed alarm set Nurse Communication: Mobility status;Other (comment) (pt refusing OOB, c/o nausea and constipation) PT Visit Diagnosis: Other abnormalities of gait and mobility (R26.89);Muscle weakness (generalized) (M62.81)     Time: 9528-4132 PT Time Calculation (min) (ACUTE ONLY): 13 min  Charges:    $Therapeutic Activity: 8-22 mins PT General Charges $$ ACUTE PT VISIT: 1 Visit                     Mary Hack P., PTA Acute Rehabilitation Services Secure Chat Preferred 9a-5:30pm Office: [phone]    Mary Chase [facility] 04/24/2024, 6:10 PM

## 2024-04-24 NOTE — Progress Notes (Signed)
 Progress Note   Patient: Mary Chase WUJ:[id number] DOB: 11/08/1935 DOA: 04/21/2024     2 DOS: the patient was seen and examined on 04/24/2024   Brief hospital course: Mrs. Diggins was admitted to the hospital with the working diagnosis of COPD exacerbation.   89 y.o. female with medical history significant of asthma/ COPD, aortic stenosis, coronary artery disease, CKD,  dyslipidemia and sickle call trait who presented with dyspnea.  Reported worsening dyspnea for the last 3 weeks, initially on exertion and then progress to having symptoms with minimal efforts. Dyspnea was associated with increased mucous production, coughing and wheezing. On her initial physical examination her blood pressure was 121/69, HR 105 and RR 17, 02 saturation 91%  Cardiovascular with S1 and S2 present and regular with no gallops, rubs or murmurs. No JVD. Respiratory with positive inspiratory and expiratory wheezing, positive bilateral rhonchi, with no significant rales. Positive increased work of breathing and speaking in short sentences. No accessory muscle use. Abdomen with no distention. No lower extremity edema   Na 135, K 2,7 Cl 101, bicarbonate 23 glucose 141 bun 5 cr 0,93  Mg 1.3  Wbc 10,1 hgb 12.1 plt 260  Sars covid 19 negative Influenza negative  RSV negative  Urine analysis SG > 1.046 protein 100, negative leukocytes, negative hgb    Chest radiograph with cardiomegaly, with mild hilar vascular congestion and bibasilar atelectasis.  CT chest/ abdomen/ pelvis, no evidence of arterial embolus or aortic dissection. Cardiomegaly with small pericardial effusion.  Diffuse bronchial thickening with scattered bilateral posterior basal subsegmental bronchi plugging.  Ill defined airspace disease in the anterior segment of the right upper lobe.  Cholelithiasis.   05/04 patient was placed on steroids and bronchodilator therapy. One dose of IV furosemide  due to persistent dyspnea.  05/05 dyspnea has been improved  but not back to her baseline.   Assessment and Plan: * COPD exacerbation (HCC) Asthma/COPD exacerbation.  Bronchodilator therapy with duoneb every 6 hrs and albuterol  q 2 hrs prn Inhaled corticosteroid therapy with bid budesonide Systemic corticosteroids with methylprednisolone  40 mg IV bid Airway clearing techniques with flutter valve and incentive spirometer.  Out of bed to chair tid with meals., PT and OT.  Continue oxymetry monitoring and supplemental 02 per [place] to keep 02 saturation 88% or greater.  Antibiotic therapy with Azithromycin po for 5 days.    Patient with improved ventilation and wheezing today.   HTN (hypertension) Continue blood pressure control with amlodipine  and losartan .   CAD (coronary artery disease), native coronary artery No chest pain, no acute coronary syndrome Continue with statin therapy, patient not on aspirin    CKD stage 3a, GFR 45-59 ml/min (HCC) Hypokalemia and hypomagnesemia AKI  Renal function today with serum cr 1,27 with K at 4,7 and serum bicarbonate at 24  Na 136 and Mg 2.2   Plan to continue close follow up renal function and electrolytes.  Hold on loop diuretic for now.   Acute on chronic diastolic CHF (congestive heart failure) (HCC) Pending echocardiogram.  Volume status has improved.   Continue blood pressure control with amlodipine  and losartan .  Hold on loop diuretic for today   Osteoarthritis of right hip PT and OT   Obesity, class 1 Calculated BMI is 32.9       Subjective: patient with improvement in her dyspnea but not yet back to baseline, no chest pain, no PND or orthopnea. Positive bilateral shoulder pain   Physical Exam: Vitals:   04/23/24 2253 04/23/24 2254 04/24/24 9562  04/24/24 0818  BP: 121/73 121/73  133/76  Pulse: (!) 103 (!) 103 (!) 106 (!) 108  Resp:   (!) 21 16  Temp: 98.8 F (37.1 C) 98.8 F (37.1 C)  97.8 F (36.6 C)  TempSrc: Oral Oral  Oral  SpO2: 100% 100% 100% 100%  Weight:      Height:        Neurology awake and alert ENT with mild pallor Cardiovascular with S1 and S2 present and regular with no gallops, rubs or murmurs Respiratory with positive expiratory wheezing with no rales or rhonchi. Improved ventilation from yesterday  Abdomen with no distention  No lower extremity edema  Data Reviewed:    Family Communication: no family at the bedside   Disposition: Status is: Inpatient Remains inpatient appropriate because: COPD exacerbation   Planned Discharge Destination: Home    Author: Albertus Alt, MD 04/24/2024 10:54 AM  For on call review [email or web address].

## 2024-04-25 DIAGNOSIS — I2510 Atherosclerotic heart disease of native coronary artery without angina pectoris: Secondary | ICD-10-CM | POA: Diagnosis not present

## 2024-04-25 DIAGNOSIS — J441 Chronic obstructive pulmonary disease with (acute) exacerbation: Secondary | ICD-10-CM | POA: Diagnosis not present

## 2024-04-25 DIAGNOSIS — I10 Essential (primary) hypertension: Secondary | ICD-10-CM | POA: Diagnosis not present

## 2024-04-25 DIAGNOSIS — I5033 Acute on chronic diastolic (congestive) heart failure: Secondary | ICD-10-CM | POA: Diagnosis not present

## 2024-04-25 LAB — BASIC METABOLIC PANEL WITH GFR
Anion gap: 10 (ref 5–15)
BUN: 26 mg/dL — ABNORMAL HIGH (ref 8–23)
CO2: 24 mmol/L (ref 22–32)
Calcium: 9.3 mg/dL (ref 8.9–10.3)
Chloride: 103 mmol/L (ref 98–111)
Creatinine, Ser: 1.17 mg/dL — ABNORMAL HIGH (ref 0.44–1.00)
GFR, Estimated: 45 mL/min — ABNORMAL LOW (ref >60)
Glucose, Bld: 155 mg/dL — ABNORMAL HIGH (ref 70–99)
Potassium: 4.7 mmol/L (ref 3.5–5.1)
Sodium: 137 mmol/L (ref 135–145)

## 2024-04-25 LAB — MAGNESIUM: Magnesium: 2.2 mg/dL (ref 1.7–2.4)

## 2024-04-25 MED ORDER — EMPAGLIFLOZIN 10 MG PO TABS
10.0000 mg | ORAL_TABLET | Freq: Every day | ORAL | Status: DC
  Administered 2024-04-25 – 2024-04-27 (×3): 10 mg via ORAL
  Filled 2024-04-25 (×3): qty 1

## 2024-04-25 MED ORDER — FUROSEMIDE 20 MG PO TABS
20.0000 mg | ORAL_TABLET | Freq: Every day | ORAL | Status: DC
  Administered 2024-04-25 – 2024-04-26 (×2): 20 mg via ORAL
  Filled 2024-04-25 (×2): qty 1

## 2024-04-25 MED ORDER — SPIRONOLACTONE 12.5 MG HALF TABLET
12.5000 mg | ORAL_TABLET | Freq: Every day | ORAL | Status: DC
  Administered 2024-04-25 – 2024-04-27 (×3): 12.5 mg via ORAL
  Filled 2024-04-25 (×3): qty 1

## 2024-04-25 NOTE — Progress Notes (Signed)
 Occupational Therapy Treatment Patient Details Name: Mary Chase MRN: [id number] DOB: [birth year] Today's Date: 04/25/2024   History of present illness 89 y.o. female presents to [facility] 04/21/24 for worsening dyspnea, coughing, and wheezing. Admitted with COPD/asthma exacerbation. Chest x-ray showed evidence of cardiomegaly with pulmonary vascular congestion and by basilar atelectasis or infiltrates. PMHx: asthma/ COPD, aortic stenosis, CAD, CKD stage 3a, dyslipidemia and sickle call trait, R hip OA, cataract, mitral regurgitation   OT comments  Pt reports ind at baseline but mobility/ADLs have gotten progressively more difficulty and pt has mainly been confined to her bedroom at home. Pt currently needing set up - CGA for ADLs, supervision for bed mobility and CGA for transfers with RW. Pt needing ~10 min seated rest with activity, VSS throughout on supplemental O2. Pt educated on PLB and reports 3/10 exertion with mobilizing. Pt presenting with impairments listed below, will follow acutely. Recommend HHOT at d/c, though pt may decline services.      If plan is discharge home, recommend the following:  A little help with walking and/or transfers;A little help with bathing/dressing/bathroom;Assistance with cooking/housework;Assist for transportation;Help with stairs or ramp for entrance   Equipment Recommendations  None recommended by OT    Recommendations for Other Services PT consult    Precautions / Restrictions Precautions Precautions: Fall Recall of Precautions/Restrictions: Impaired Precaution/Restrictions Comments: Decreased insight risks of immobility/benefits of mobility; watch O2 Restrictions Weight Bearing Restrictions Per Provider Order: No       Mobility Bed Mobility Overal bed mobility: Needs Assistance Bed Mobility: Supine to Sit, Sit to Supine     Supine to sit: Supervision, HOB elevated          Transfers Overall transfer level: Needs assistance Equipment  used: Rolling walker (2 wheels) Transfers: Sit to/from Stand Sit to Stand: Contact guard assist                 Balance Overall balance assessment: Needs assistance Sitting-balance support: No upper extremity supported, Feet supported Sitting balance-Leahy Scale: Fair     Standing balance support: Bilateral upper extremity supported, Reliant on assistive device for balance, During functional activity Standing balance-Leahy Scale: Poor                             ADL either performed or assessed with clinical judgement   ADL Overall ADL's : Needs assistance/impaired Eating/Feeding: Set up;Sitting   Grooming: Wash/dry hands;Standing                   Toilet Transfer: Contact guard assist;Ambulation;Rolling walker (2 wheels)   Toileting- Clothing Manipulation and Hygiene: Contact guard assist       Functional mobility during ADLs: Contact guard assist;Rolling walker (2 wheels)      Extremity/Trunk Assessment Upper Extremity Assessment Upper Extremity Assessment: Overall WFL for tasks assessed   Lower Extremity Assessment Lower Extremity Assessment: Defer to PT evaluation        Vision   Vision Assessment?: Wears glasses for reading;No apparent visual deficits   Perception Perception Perception: Not tested   Praxis Praxis Praxis: Not tested   Communication Communication Communication: No apparent difficulties   Cognition Arousal: Alert Behavior During Therapy: WFL for tasks assessed/performed Cognition: No apparent impairments             OT - Cognition Comments: understands importance of mobility, but decr awareness of overall deficits  Following commands: Intact        Cueing   Cueing Techniques: Verbal cues  Exercises      Shoulder Instructions       General Comments VSS on supplemental O2    Pertinent Vitals/ Pain       Pain Assessment Pain Assessment: Faces Pain Score: 2  Faces Pain  Scale: Hurts a little bit Pain Location: hoarse, heartburn/nausea Pain Descriptors / Indicators: Discomfort, Sore, Grimacing, Guarding Pain Intervention(s): Limited activity within patient's tolerance, Monitored during session, Repositioned  Home Living                                          Prior Functioning/Environment              Frequency  Min 2X/week        Progress Toward Goals  OT Goals(current goals can now be found in the care plan section)  Progress towards OT goals: Progressing toward goals  Acute Rehab OT Goals Patient Stated Goal: none stated OT Goal Formulation: With patient Time For Goal Achievement: 05/07/24 Potential to Achieve Goals: Good ADL Goals Pt Will Perform Lower Body Bathing: with modified independence;sitting/lateral leans;sit to/from stand Pt Will Perform Lower Body Dressing: with modified independence;sit to/from stand;sitting/lateral leans Pt Will Transfer to Toilet: with modified independence;ambulating Pt Will Perform Toileting - Clothing Manipulation and hygiene: with modified independence;sitting/lateral leans;sit to/from stand Pt/caregiver will Perform Home Exercise Program: Both right and left upper extremity;With written HEP provided;With theraband;Increased strength Additional ADL Goal #1: Patient will be able to complete functional task in standing for 3 minutes prior to needing seated rest break in order to increase activity tolerance.  Plan      Co-evaluation                 AM-PAC OT "6 Clicks" Daily Activity     Outcome Measure   Help from another person eating meals?: A Little Help from another person taking care of personal grooming?: A Little Help from another person toileting, which includes using toliet, bedpan, or urinal?: A Little Help from another person bathing (including washing, rinsing, drying)?: A Lot Help from another person to put on and taking off regular upper body clothing?: A  Little Help from another person to put on and taking off regular lower body clothing?: A Lot 6 Click Score: 16    End of Session Equipment Utilized During Treatment: Gait belt;Rolling walker (2 wheels)  OT Visit Diagnosis: Unsteadiness on feet (R26.81);Other abnormalities of gait and mobility (R26.89);Muscle weakness (generalized) (M62.81)   Activity Tolerance Patient tolerated treatment well   Patient Left in chair;with call bell/phone within reach;with chair alarm set   Nurse Communication Mobility status        Time: 4098-1191 OT Time Calculation (min): 18 min  Charges: OT General Charges $OT Visit: 1 Visit OT Treatments $Self Care/Home Management : 8-22 mins  Nihal Doan K, OTD, OTR/L SecureChat Preferred Acute Rehab [phone]   Mathis Som K Koonce 04/25/2024, 2:42 PM

## 2024-04-25 NOTE — Progress Notes (Signed)
 Progress Note   Patient: Mary Chase ZOX:[id number] DOB: [birth year] DOA: 04/21/2024     3 DOS: the patient was seen and examined on 04/25/2024   Brief hospital course: Mrs. Rossa was admitted to the hospital with the working diagnosis of COPD exacerbation.   89 y.o. female with medical history significant of asthma/ COPD, aortic stenosis, coronary artery disease, CKD,  dyslipidemia and sickle call trait who presented with dyspnea.  Reported worsening dyspnea for the last 3 weeks, initially on exertion and then progress to having symptoms with minimal efforts. Dyspnea was associated with increased mucous production, coughing and wheezing. On her initial physical examination her blood pressure was 121/69, HR 105 and RR 17, 02 saturation 91%  Cardiovascular with S1 and S2 present and regular with no gallops, rubs or murmurs. No JVD. Respiratory with positive inspiratory and expiratory wheezing, positive bilateral rhonchi, with no significant rales. Positive increased work of breathing and speaking in short sentences. No accessory muscle use. Abdomen with no distention. No lower extremity edema   Na 135, K 2,7 Cl 101, bicarbonate 23 glucose 141 bun 5 cr 0,93  Mg 1.3  Wbc 10,1 hgb 12.1 plt 260  Sars covid 19 negative Influenza negative  RSV negative  Urine analysis SG > 1.046 protein 100, negative leukocytes, negative hgb    Chest radiograph with cardiomegaly, with mild hilar vascular congestion and bibasilar atelectasis.  CT chest/ abdomen/ pelvis, no evidence of arterial embolus or aortic dissection. Cardiomegaly with small pericardial effusion.  Diffuse bronchial thickening with scattered bilateral posterior basal subsegmental bronchi plugging.  Ill defined airspace disease in the anterior segment of the right upper lobe.  Cholelithiasis.   05/04 patient was placed on steroids and bronchodilator therapy. One dose of IV furosemide  due to persistent dyspnea.  05/05 dyspnea has been improved  but not back to her baseline.  05/06 clinically improving but not yet back to baseline, resume oral loop diuretic   Assessment and Plan: * COPD exacerbation (HCC) Asthma/COPD exacerbation.  Bronchodilator therapy with duoneb every 6 hrs and albuterol  q 2 hrs prn Inhaled corticosteroid therapy with bid budesonide Systemic corticosteroids with methylprednisolone  40 mg IV bid Airway clearing techniques with flutter valve and incentive spirometer.  Out of bed to chair tid with meals., PT and OT.  Continue oxymetry monitoring and supplemental 02 per [place] to keep 02 saturation 88% or greater.  Antibiotic therapy with Azithromycin po for 5 days.    Patient with improved ventilation and wheezing today.   Acute on chronic diastolic CHF (congestive heart failure) (HCC) Echocardiogram with preserved LV systolic function with EF 60 to 65%, RV systolic function preserved, RVSP 57.0 mmHg, LA and RA with mild dilatation, mild to moderate mitral valve regurgitation. Mild to moderate aortic stenosis. Trivial pericardial effusion.   Acute on chronic core pulmonale Pulmonary hypertension   Systolic blood pressure 120 mmHg range.   Plan to resume diuresis with furosemide  20 mg daily.  Add spironolactone and SGLT 2 inh  Continue blood pressure control with amlodipine  and losartan .   HTN (hypertension) Continue blood pressure control with amlodipine  and losartan .   CAD (coronary artery disease), native coronary artery No chest pain, no acute coronary syndrome Continue with statin therapy, patient not on aspirin    CKD stage 3a, GFR 45-59 ml/min (HCC) Hypokalemia and hypomagnesemia AKI  Today renal function with serum cr at 1,17 with K at 4,7 and serum bicarbonate at 24  Na 137 and Mg 2.2   Plan to continue close follow  up renal function and electrolytes.  Resume loop diuretic today   Osteoarthritis of right hip PT and OT   Obesity, class 1 Calculated BMI is 32.9        Subjective:  Patient with improvement in dyspnea but not back to baseline, no chest pain, no nausea or vomiting   Physical Exam: Vitals:   04/24/24 1802 04/24/24 2134 04/25/24 0531 04/25/24 0739  BP: (!) 140/81 137/76 127/77   Pulse: (!) 110 (!) 105 100 (!) 116  Resp: 19 20 18 20   Temp: 98.3 F (36.8 C) 98.4 F (36.9 C) (!) 97.5 F (36.4 C)   TempSrc:  Oral Oral   SpO2: 92% 100% 100% 97%  Weight:      Height:       Neurology awake and alert ENT with mild pallor with no icterus Cardiovascular with S1 and S2 present and regular with no gallops or rubs, positive systolic murmur at the base No JVD No lower extremity edema Respiratory with bilateral wheezing and scattered rhonchi, no rales, prolonged expiratory phase Abdomen with no distention   Data Reviewed:   Family Communication: no family at the bedside   Disposition: Status is: Inpatient Remains inpatient appropriate because: respiratory support   Planned Discharge Destination: Home     Author: Albertus Alt, MD 04/25/2024 11:45 AM  For on call review [email or web address].

## 2024-04-25 NOTE — Progress Notes (Signed)
 Patient pulse rate and RR was elevated, Md made aware and Md  gave new orders for lasix 

## 2024-04-25 NOTE — Plan of Care (Signed)

## 2024-04-25 NOTE — Progress Notes (Addendum)
 Physical Therapy Treatment Patient Details Name: Mary Chase MRN: [id number] DOB: March 19, 1935 Today's Date: 04/25/2024   History of Present Illness 89 y.o. female presents to [facility] 04/21/24 for worsening dyspnea, coughing, and wheezing. Admitted with COPD/asthma exacerbation. Chest x-ray showed evidence of cardiomegaly with pulmonary vascular congestion and by basilar atelectasis or infiltrates. PMHx: asthma/ COPD, aortic stenosis, CAD, CKD stage 3a, dyslipidemia and sickle call trait, R hip OA, cataract, mitral regurgitation.    PT Comments  Pt received in supine, agreeable to therapy session with encouragement, initially sleeping but easily awoken. Pt needing increased time to initiate and perform functional mobility tasks, and c/o significant dyspnea on exertion, 3-4/4 DOE with ~10 mins seated break after mobilizing very short household distances due to increased work of breathing. Pt also with increased secretions or wheezing type sounds with upright postures today.Pt needing up to CGA for functional mobility tasks with RW support this date. Pt encouraged to sit up more frequently during the day to work on improved pulmonary clearance. Pt appreciative of staff assist and agreeable to sit up in recliner at least 1 hour at end of session, RN notified, chair alarm on for pt safety. HR ~128 bpm with exertion and SpO2 93% and greater on 3L/min O2 [place] with exertional tasks.    If plan is discharge home, recommend the following: A little help with walking and/or transfers;A little help with bathing/dressing/bathroom;Assist for transportation;Help with stairs or ramp for entrance;Assistance with cooking/housework   Can travel by private vehicle        Equipment Recommendations  None recommended by PT (consider pressure relief cushion for her chair at home)    Recommendations for Other Services       Precautions / Restrictions Precautions Precautions: Fall Recall of Precautions/Restrictions:  Impaired Precaution/Restrictions Comments: Decreased insight risks of immobility/benefits of mobility; watch O2 Restrictions Weight Bearing Restrictions Per Provider Order: No     Mobility  Bed Mobility Overal bed mobility: Needs Assistance Bed Mobility: Supine to Sit     Supine to sit: Supervision, HOB elevated     General bed mobility comments: increased time to initiate; use of bed features to perform    Transfers Overall transfer level: Needs assistance Equipment used: Rolling walker (2 wheels) Transfers: Sit to/from Stand Sit to Stand: Contact guard assist           General transfer comment: EOB>RW and lower toilet surface<>RW    Ambulation/Gait Ambulation/Gait assistance: Contact guard assist Gait Distance (Feet): 15 Feet (51ft to bathroom, then 23ft to chair near sink, then 45ft to recliner.) Assistive device: Rolling walker (2 wheels) Gait Pattern/deviations: Step-through pattern, Shuffle, Trunk flexed       General Gait Details: increased work of breathing with exertion; SpO2 WFL on 3L O2 [place] however pt c/o feeling unable to catch her breath; HR ~128 bpm with exertion, decreased to ~110-120 bpm sitting in chair. Needs ~5 mins in bathroom to urinate/catch her breath, then ~10 mins seated break after walking from bathroom to sink to wash hands. Then performed ~46ft from chair, stepped to/from platform step in room, and agreeable to sit up in recliner.   Stairs Stairs: Yes Stairs assistance: Contact guard assist Stair Management: Forwards, Step to pattern, With walker Number of Stairs: 1 General stair comments: pt performs foot taps with each leg prior to stepping up to ensure she is able, then stepping up onto/down from 7" platform step in room without LOB or buckling.   Wheelchair Mobility     Tilt  Bed    Modified Rankin (Stroke Patients Only)       Balance Overall balance assessment: Needs assistance Sitting-balance support: No upper extremity  supported, Feet supported Sitting balance-Leahy Scale: Fair     Standing balance support: Bilateral upper extremity supported, Reliant on assistive device for balance, During functional activity Standing balance-Leahy Scale: Poor Standing balance comment: RW reliant                            Communication Communication Communication: No apparent difficulties  Cognition Arousal: Alert Behavior During Therapy: WFL for tasks assessed/performed   PT - Cognitive impairments: No apparent impairments                       PT - Cognition Comments: reluctant to participate in OOB but appreciative of therapist assist once she is up. Following commands: Intact      Cueing Cueing Techniques: Verbal cues  Exercises Other Exercises Other Exercises: Encouraged IS use and supine/seated LE ROM as tolerated, pt reports she has been to tired in AM to perform.    General Comments General comments (skin integrity, edema, etc.): SpO2 93-95% on 3L O2 [place] with exertional tasks; HR to 128 bpm with exertion.      Pertinent Vitals/Pain Pain Assessment Pain Assessment: Faces Faces Pain Scale: Hurts a little bit Pain Location: hoarse throat, generalized discomfort but mostly r/t breathing Pain Descriptors / Indicators: Discomfort, Sore, Grimacing, Guarding Pain Intervention(s): Limited activity within patient's tolerance, Monitored during session, Repositioned    Home Living                          Prior Function            PT Goals (current goals can now be found in the care plan section) Acute Rehab PT Goals Patient Stated Goal: To go home PT Goal Formulation: With patient Time For Goal Achievement: 05/06/24 Progress towards PT goals: Progressing toward goals    Frequency    Min 2X/week      PT Plan      Co-evaluation              AM-PAC PT "6 Clicks" Mobility   Outcome Measure  Help needed turning from your back to your side while in a  flat bed without using bedrails?: None Help needed moving from lying on your back to sitting on the side of a flat bed without using bedrails?: A Little Help needed moving to and from a bed to a chair (including a wheelchair)?: A Little Help needed standing up from a chair using your arms (e.g., wheelchair or bedside chair)?: A Little Help needed to walk in hospital room?: A Little Help needed climbing 3-5 steps with a railing? : A Little 6 Click Score: 19    End of Session Equipment Utilized During Treatment: Gait belt;Oxygen Activity Tolerance: Patient limited by fatigue;Treatment limited secondary to medical complications (Comment);Other (comment) (limited by dypsnea on exertion.) Patient left: in chair;with call bell/phone within reach;with chair alarm set Nurse Communication: Mobility status;Other (comment) (pt requests breathing tx) PT Visit Diagnosis: Other abnormalities of gait and mobility (R26.89);Muscle weakness (generalized) (M62.81)     Time: 4098-1191 PT Time Calculation (min) (ACUTE ONLY): 18 min  Charges:    $Gait Training: 8-22 mins PT General Charges $$ ACUTE PT VISIT: 1 Visit  Llana Rile., PTA Acute Rehabilitation Services Secure Chat Preferred 9a-5:30pm Office: [phone]    Mariel Shope [facility] 04/25/2024, 5:00 PM

## 2024-04-25 NOTE — Progress Notes (Signed)
 Transition of Care [facility]) - Inpatient Brief Assessment   Patient Details  Name: Mary Chase MRN: [id number] Date of Birth: 1935-01-23  [facility] CM/SW Contact:    Dane Dung, RN Phone Number: 04/25/2024, 4:31 PM   Clinical Narrative: CM met with the patient at the bedside to discuss TOC needs.  The patient lives with her son at the home and plans to return home with home health services.  Patient was provided choice regarding home health services and patient prefers [facility].  I called [facility] and  the agency accepted her for PT services.  HH order placed.   Transition of Care Asessment: Insurance and Status: (P) Insurance coverage has been reviewed Patient has primary care physician: (P) Yes Home environment has been reviewed: (P) from home with son Prior level of function:: (P) RW Prior/Current Home Services: (P) No current home services Social Drivers of Health Review: (P) SDOH reviewed needs interventions Readmission risk has been reviewed: (P) Yes Transition of care needs: (P) transition of care needs identified, TOC will continue to follow

## 2024-04-25 NOTE — Care Management Important Message (Signed)
 Important Message  Patient Details  Name: Mary Chase MRN: [id number] Date of Birth: 09/04/1935   Important Message Given:  Yes - Medicare IM     Wynonia Hedges 04/25/2024, 3:32 PM

## 2024-04-25 NOTE — Progress Notes (Signed)
 PROGRESS NOTE    Mary Chase  WJX:[id number] DOB: 1935-08-09 DOA: 04/21/2024 PCP: Arva Lathe, MD  89/F w asthma/ COPD, aortic stenosis, CAD, CKD,  dyslipidemia and sickle call trait who presented with dyspnea.  -worsening dyspnea for the last 3 weeks, initially on exertion and then progressed to having symptoms with minimal efforts. - bun 5 cr 0,93 Mg 1.3  -CXR w cardiomegaly, with mild hilar vascular congestion CT chest/ abdomen/ pelvis, No PE. Cardiomegaly with small pericardial effusion. Diffuse bronchial thickening with scattered bilateral posterior basal subsegmental bronchi plugging.  Ill defined airspace disease. Cholelithiasis.  -5/04 patient was placed on steroids and bronchodilator therapy. One dose of IV furosemide  due to persistent dyspnea.  -5/05 dyspnea has been improved but not back to her baseline.  -5/06 clinically improving but not yet back to baseline, resume oral loop diuretic   Subjective: Feels fair, breathing improving, still with some shortness of breath with minimal activity  Assessment and Plan:   COPD exacerbation (HCC) Asthma/COPD exacerbation. - Flu/COVID/RSV negative  -currently on IV Solu-Medrol , azithromycin - Continue DuoNebs, pulmonary toilet, flutter valve, out of bed to chair  - Switch to oral prednisone  tomorrow - Discharge planning, home soon  Acute on chronic diastolic CHF Pulm HTN -Echo with EF 60 to 65%, RVpreserved, mild to moderate mitral valve regurgitation. Mild to moderate aortic stenosis. - Single dose of IV Lasix  X1 today -Continue losartan , Aldactone, Jardiance  HTN (hypertension) Continue  amlodipine  and losartan .   CAD (coronary artery disease), native coronary artery No chest pain, no acute coronary syndrome Continue with statin therapy, patient not on aspirin    CKD stage 3a, GFR 45-59 ml/min (HCC) Hypokalemia and hypomagnesemia AKI stable  Osteoarthritis of right hip PT and OT   Obesity, class  1 Calculated BMI is 32.9   DVT prophylaxis: Add Lovenox  Code Status: Full code Family Communication: None present Disposition Plan:   Consultants:    Procedures:   Antimicrobials:    Objective: Vitals:   04/25/24 0739 04/25/24 1154 04/25/24 1418 04/25/24 1718  BP:  136/83  136/79  Pulse: (!) 116 (!) 102 (!) 103 (!) 106  Resp: 20  (!) 23 17  Temp:      TempSrc:      SpO2: 97% 100% 97% 100%  Weight:      Height:       No intake or output data in the 24 hours ending 04/25/24 1725 Filed Weights   04/21/24 2126  Weight: 76.6 kg    Examination:  Gen: Awake, Alert, Oriented X 3,  HEENT: + JVD Lungs: Few basilar Rales, and scattered wheezes CVS: S1S2/RRR Abd: soft, Non tender, non distended, BS present Extremities: Trace edema Skin: no new rashes on exposed skin    Data Reviewed:   CBC: Recent Labs  Lab 04/21/24 2240  WBC 10.1  NEUTROABS 8.0*  HGB 12.1  HCT 36.1  MCV 94.0  PLT 260   Basic Metabolic Panel: Recent Labs  Lab 04/21/24 2240 04/22/24 1435 04/23/24 0524 04/24/24 0415 04/25/24 0556  NA 135 137 139 136 137  K 2.7* 3.5 4.4 4.7 4.7  CL 101 101 104 103 103  CO2 23 24 24 24 24   GLUCOSE 141* 185* 151* 161* 155*  BUN 5* 13 16 24* 26*  CREATININE 0.93 1.24* 1.19* 1.27* 1.17*  CALCIUM  8.9 9.3 9.5 9.1 9.3  MG 1.3* 2.0 2.2 2.2 2.2   GFR: Estimated Creatinine Clearance: 31.5 mL/min (A) (by C-G formula based on SCr of 1.17 mg/dL (  H)). Liver Function Tests: Recent Labs  Lab 04/21/24 2240  AST 18  ALT 7  ALKPHOS 73  BILITOT 1.2  PROT 8.2*  ALBUMIN 3.4*   No results for input(s): "LIPASE", "AMYLASE" in the last 168 hours. No results for input(s): "AMMONIA" in the last 168 hours. Coagulation Profile: No results for input(s): "INR", "PROTIME" in the last 168 hours. Cardiac Enzymes: No results for input(s): "CKTOTAL", "CKMB", "CKMBINDEX", "TROPONINI" in the last 168 hours. BNP (last 3 results) No results for input(s): "PROBNP" in the last  8760 hours. HbA1C: No results for input(s): "HGBA1C" in the last 72 hours. CBG: No results for input(s): "GLUCAP" in the last 168 hours. Lipid Profile: No results for input(s): "CHOL", "HDL", "LDLCALC", "TRIG", "CHOLHDL", "LDLDIRECT" in the last 72 hours. Thyroid  Function Tests: No results for input(s): "TSH", "T4TOTAL", "FREET4", "T3FREE", "THYROIDAB" in the last 72 hours. Anemia Panel: No results for input(s): "VITAMINB12", "FOLATE", "FERRITIN", "TIBC", "IRON", "RETICCTPCT" in the last 72 hours. Urine analysis:    Component Value Date/Time   COLORURINE YELLOW 04/22/2024 0536   APPEARANCEUR CLEAR 04/22/2024 0536   LABSPEC >1.046 (H) 04/22/2024 0536   PHURINE 5.0 04/22/2024 0536   GLUCOSEU NEGATIVE 04/22/2024 0536   HGBUR SMALL (A) 04/22/2024 0536   BILIRUBINUR NEGATIVE 04/22/2024 0536   KETONESUR NEGATIVE 04/22/2024 0536   PROTEINUR 100 (A) 04/22/2024 0536   NITRITE NEGATIVE 04/22/2024 0536   LEUKOCYTESUR NEGATIVE 04/22/2024 0536   Sepsis Labs: @LABRCNTIP (procalcitonin:4,lacticidven:4)  ) Recent Results (from the past 240 hours)  Resp panel by RT-PCR (RSV, Flu A&B, Covid) Anterior Nasal Swab     Status: None   Collection Time: 04/21/24  9:51 PM   Specimen: Anterior Nasal Swab  Result Value Ref Range Status   SARS Coronavirus 2 by RT PCR NEGATIVE NEGATIVE Final   Influenza A by PCR NEGATIVE NEGATIVE Final   Influenza B by PCR NEGATIVE NEGATIVE Final    Comment: (NOTE) The Xpert Xpress SARS-CoV-2/FLU/RSV plus assay is intended as an aid in the diagnosis of influenza from Nasopharyngeal swab specimens and should not be used as a sole basis for treatment. Nasal washings and aspirates are unacceptable for Xpert Xpress SARS-CoV-2/FLU/RSV testing.  Fact Sheet for Patients: BloggerCourse.com  Fact Sheet for Healthcare Providers: SeriousBroker.it  This test is not yet approved or cleared by the [place]  FDA and has been  authorized for detection and/or diagnosis of SARS-CoV-2 by FDA under an Emergency Use Authorization (EUA). This EUA will remain in effect (meaning this test can be used) for the duration of the COVID-19 declaration under Section 564(b)(1) of the Act, 21 U.S.C. section 360bbb-3(b)(1), unless the authorization is terminated or revoked.     Resp Syncytial Virus by PCR NEGATIVE NEGATIVE Final    Comment: (NOTE) Fact Sheet for Patients: BloggerCourse.com  Fact Sheet for Healthcare Providers: SeriousBroker.it  This test is not yet approved or cleared by the [place]  FDA and has been authorized for detection and/or diagnosis of SARS-CoV-2 by FDA under an Emergency Use Authorization (EUA). This EUA will remain in effect (meaning this test can be used) for the duration of the COVID-19 declaration under Section 564(b)(1) of the Act, 21 U.S.C. section 360bbb-3(b)(1), unless the authorization is terminated or revoked.  Performed at [facility], [address] [place], [place] [address]      Radiology Studies: ECHOCARDIOGRAM COMPLETE Result Date: 04/24/2024    ECHOCARDIOGRAM REPORT   Patient Name:   SHONTEL BERANEK Date of Exam: 04/24/2024 Medical Rec #:  [id number]  Height:       60.0 in Accession #:    [id number]       Weight:       168.9 lb Date of Birth:  01/08/1935       BSA:          1.737 m Patient Age:    88 years         BP:           133/76 mmHg Patient Gender: F                HR:           107 bpm. Exam Location:  Inpatient Procedure: 2D Echo, Cardiac Doppler and Color Doppler (Both Spectral and Color            Flow Doppler were utilized during procedure). Indications:    Dyspnea R06.00  History:        Patient has prior history of Echocardiogram examinations, most                 recent 06/04/2023. CHF, CAD, Pulmonary HTN, COPD and CKD, stage                 3; Risk Factors:Hypertension.  Sonographer:    Terrilee Few  RCS Referring Phys: [id number] MAURICIO DANIEL ARRIEN IMPRESSIONS  1. Left ventricular ejection fraction, by estimation, is 60 to 65%. The left ventricle has normal function. The left ventricle has no regional wall motion abnormalities. Left ventricular diastolic parameters were normal.  2. Right ventricular systolic function is normal. The right ventricular size is normal. There is moderately elevated pulmonary artery systolic pressure. The estimated right ventricular systolic pressure is 57.0 mmHg.  3. Left atrial size was mildly dilated.  4. Right atrial size was mildly dilated.  5. The mitral valve is normal in structure. Mild to moderate mitral valve regurgitation. No evidence of mitral stenosis.  6. Aortic stenosis mild by gradients, but low flow by stroke volume, so likely underestimated. Moderate stenosis by valve area and DI. The aortic valve is tricuspid. There is moderate calcification of the aortic valve. There is moderate thickening of the aortic valve. Aortic valve regurgitation is not visualized. Mild to moderate aortic valve stenosis. Aortic valve mean gradient measures 16.0 mmHg.  7. Aortic dilatation noted. There is borderline dilatation of the ascending aorta, measuring 38 mm.  8. The inferior vena cava is dilated in size with <50% respiratory variability, suggesting right atrial pressure of 15 mmHg. Comparison(s): Prior images reviewed side by side. Conclusion(s)/Recommendation(s): Normal LVEF, normal diastolic parameters on current study, moderate MR, mild-moderate AS, elevated RA and RV pressures. FINDINGS  Left Ventricle: Left ventricular ejection fraction, by estimation, is 60 to 65%. The left ventricle has normal function. The left ventricle has no regional wall motion abnormalities. The left ventricular internal cavity size was normal in size. There is  no left ventricular hypertrophy. Left ventricular diastolic parameters were normal. Right Ventricle: The right ventricular size is normal. No  increase in right ventricular wall thickness. Right ventricular systolic function is normal. There is moderately elevated pulmonary artery systolic pressure. The tricuspid regurgitant velocity is 3.24 m/s, and with an assumed right atrial pressure of 15 mmHg, the estimated right ventricular systolic pressure is 57.0 mmHg. Left Atrium: Left atrial size was mildly dilated. Right Atrium: Right atrial size was mildly dilated. Pericardium: Trivial pericardial effusion is present. Mitral Valve: The mitral valve is normal in structure. Mild to moderate mitral valve regurgitation. No  evidence of mitral valve stenosis. Tricuspid Valve: The tricuspid valve is normal in structure. Tricuspid valve regurgitation is mild . No evidence of tricuspid stenosis. Aortic Valve: Aortic stenosis mild by gradients, but low flow by stroke volume, so likely underestimated. Moderate stenosis by valve area and DI. The aortic valve is tricuspid. There is moderate calcification of the aortic valve. There is moderate thickening of the aortic valve. Aortic valve regurgitation is not visualized. Mild to moderate aortic stenosis is present. Aortic valve mean gradient measures 16.0 mmHg. Aortic valve peak gradient measures 28.1 mmHg. Aortic valve area, by VTI measures 1.01 cm. Pulmonic Valve: The pulmonic valve was not well visualized. Pulmonic valve regurgitation is not visualized. No evidence of pulmonic stenosis. Aorta: Aortic dilatation noted. There is borderline dilatation of the ascending aorta, measuring 38 mm. Venous: The inferior vena cava is dilated in size with less than 50% respiratory variability, suggesting right atrial pressure of 15 mmHg. IAS/Shunts: The atrial septum is grossly normal.  LEFT VENTRICLE PLAX 2D LVIDd:         3.20 cm   Diastology LVIDs:         2.00 cm   LV e' medial:    8.16 cm/s LV PW:         1.00 cm   LV E/e' medial:  13.8 LV IVS:        1.20 cm   LV e' lateral:   9.03 cm/s LVOT diam:     1.80 cm   LV E/e'  lateral: 12.5 LV SV:         50 LV SV Index:   29 LVOT Area:     2.54 cm  RIGHT VENTRICLE             IVC RV S prime:     23.90 cm/s  IVC diam: 2.50 cm TAPSE (M-mode): 2.3 cm LEFT ATRIUM             Index        RIGHT ATRIUM           Index LA diam:        3.40 cm 1.96 cm/m   RA Area:     17.50 cm LA Vol (A2C):   42.8 ml 24.66 ml/m  RA Volume:   47.50 ml  27.34 ml/m LA Vol (A4C):   35.3 ml 20.32 ml/m LA Biplane Vol: 39.5 ml 22.74 ml/m  AORTIC VALVE AV Area (Vmax):    1.04 cm AV Area (Vmean):   0.97 cm AV Area (VTI):     1.01 cm AV Vmax:           265.00 cm/s AV Vmean:          187.333 cm/s AV VTI:            0.493 m AV Peak Grad:      28.1 mmHg AV Mean Grad:      16.0 mmHg LVOT Vmax:         108.00 cm/s LVOT Vmean:        71.300 cm/s LVOT VTI:          0.195 m LVOT/AV VTI ratio: 0.40  AORTA Ao Root diam: 3.20 cm Ao Asc diam:  3.80 cm MITRAL VALVE                TRICUSPID VALVE MV Area (PHT): 5.52 cm     TR Peak grad:   42.0 mmHg MV Decel Time: 138 msec  TR Vmax:        324.00 cm/s MR Peak grad: 106.5 mmHg MR Vmax:      516.00 cm/s   SHUNTS MV E velocity: 112.50 cm/s  Systemic VTI:  0.20 m MV A velocity: 147.00 cm/s  Systemic Diam: 1.80 cm MV E/A ratio:  0.77 Sheryle Donning MD Electronically signed by Sheryle Donning MD Signature Date/Time: 04/24/2024/6:22:49 PM    Final      Scheduled Meds:  amLODipine   10 mg Oral Daily   aspirin  EC  81 mg Oral BID   azithromycin  500 mg Oral Daily   budesonide (PULMICORT) nebulizer solution  0.25 mg Nebulization BID   diclofenac Sodium  2 g Topical QID   empagliflozin  10 mg Oral Daily   furosemide   20 mg Oral Daily   guaiFENesin   600 mg Oral BID   ipratropium-albuterol   3 mL Nebulization Q6H   losartan   50 mg Oral Daily   methylPREDNISolone  (SOLU-MEDROL ) injection  40 mg Intravenous Q12H   montelukast   10 mg Oral Daily   polyethylene glycol  17 g Oral Daily   rosuvastatin   5 mg Oral QODAY   spironolactone  12.5 mg Oral Daily    Continuous Infusions:   LOS: 3 days    Time spent:    Deforest Fast, MD [facility]   04/25/2024, 5:25 PM

## 2024-04-26 DIAGNOSIS — J441 Chronic obstructive pulmonary disease with (acute) exacerbation: Secondary | ICD-10-CM | POA: Diagnosis not present

## 2024-04-26 LAB — BASIC METABOLIC PANEL WITH GFR
Anion gap: 8 (ref 5–15)
BUN: 22 mg/dL (ref 8–23)
CO2: 28 mmol/L (ref 22–32)
Calcium: 9.3 mg/dL (ref 8.9–10.3)
Chloride: 100 mmol/L (ref 98–111)
Creatinine, Ser: 1.23 mg/dL — ABNORMAL HIGH (ref 0.44–1.00)
GFR, Estimated: 43 mL/min — ABNORMAL LOW (ref >60)
Glucose, Bld: 241 mg/dL — ABNORMAL HIGH (ref 70–99)
Potassium: 4.9 mmol/L (ref 3.5–5.1)
Sodium: 136 mmol/L (ref 135–145)

## 2024-04-26 MED ORDER — FUROSEMIDE 10 MG/ML IJ SOLN
40.0000 mg | Freq: Once | INTRAMUSCULAR | Status: AC
  Administered 2024-04-26: 40 mg via INTRAVENOUS
  Filled 2024-04-26: qty 4

## 2024-04-26 MED ORDER — IPRATROPIUM-ALBUTEROL 0.5-2.5 (3) MG/3ML IN SOLN
3.0000 mL | Freq: Two times a day (BID) | RESPIRATORY_TRACT | Status: DC
  Administered 2024-04-26 – 2024-04-27 (×2): 3 mL via RESPIRATORY_TRACT
  Filled 2024-04-26 (×2): qty 3

## 2024-04-26 MED ORDER — METHYLPREDNISOLONE SODIUM SUCC 40 MG IJ SOLR
40.0000 mg | Freq: Two times a day (BID) | INTRAMUSCULAR | Status: AC
  Administered 2024-04-26: 40 mg via INTRAVENOUS
  Filled 2024-04-26: qty 1

## 2024-04-26 MED ORDER — PREDNISONE 20 MG PO TABS
40.0000 mg | ORAL_TABLET | Freq: Every day | ORAL | Status: DC
  Administered 2024-04-27: 40 mg via ORAL
  Filled 2024-04-26: qty 2

## 2024-04-26 MED ORDER — LACTULOSE 10 GM/15ML PO SOLN
20.0000 g | Freq: Two times a day (BID) | ORAL | Status: DC
  Administered 2024-04-26 (×2): 20 g via ORAL
  Filled 2024-04-26 (×3): qty 30

## 2024-04-26 NOTE — Progress Notes (Signed)
 Mobility Specialist: Progress Note   04/26/24 1229  Mobility  Activity Ambulated with assistance in hallway  Level of Assistance Standby assist, set-up cues, supervision of patient - no hands on  Assistive Device Four wheel walker  Distance Ambulated (ft) 250 ft  Activity Response Tolerated well  Mobility Referral Yes  Mobility visit 1 Mobility  Mobility Specialist Start Time (ACUTE ONLY) 0841  Mobility Specialist Stop Time (ACUTE ONLY) 0850  Mobility Specialist Time Calculation (min) (ACUTE ONLY) 9 min    Pre-Mobility: SpO2 98% RA During Mobility: SpO2 92-94% RA Post-Mobility: SpO2 96% RA  Pt was agreeable to mobility session - received in chair. SV throughout. Requested to try w/o supplemental O2. SpO2 >90% on RA throughout. Took 1x seated break d/t fatigue and SOB. Returned to room without fault. Left in chair with all needs met, call bell in reach.   Deloria Fetch Mobility Specialist Please contact via SecureChat or Rehab office at [phone]

## 2024-04-26 NOTE — Plan of Care (Signed)

## 2024-04-26 NOTE — Progress Notes (Signed)
 Patient pulse rate was elevated, no acute distress noted, Md was made aware, no new orders.

## 2024-04-27 ENCOUNTER — Other Ambulatory Visit (HOSPITAL_COMMUNITY): Payer: Self-pay

## 2024-04-27 DIAGNOSIS — J441 Chronic obstructive pulmonary disease with (acute) exacerbation: Secondary | ICD-10-CM | POA: Diagnosis not present

## 2024-04-27 LAB — BASIC METABOLIC PANEL WITH GFR
Anion gap: 9 (ref 5–15)
BUN: 20 mg/dL (ref 8–23)
CO2: 30 mmol/L (ref 22–32)
Calcium: 9.8 mg/dL (ref 8.9–10.3)
Chloride: 98 mmol/L (ref 98–111)
Creatinine, Ser: 1.09 mg/dL — ABNORMAL HIGH (ref 0.44–1.00)
GFR, Estimated: 49 mL/min — ABNORMAL LOW (ref >60)
Glucose, Bld: 97 mg/dL (ref 70–99)
Potassium: 4.8 mmol/L (ref 3.5–5.1)
Sodium: 137 mmol/L (ref 135–145)

## 2024-04-27 MED ORDER — SPIRONOLACTONE 25 MG PO TABS
12.5000 mg | ORAL_TABLET | Freq: Every day | ORAL | 1 refills | Status: DC
  Filled 2024-04-27: qty 30, 60d supply, fill #0
  Filled 2024-05-29: qty 30, 60d supply, fill #1
  Filled ????-??-??: fill #1

## 2024-04-27 MED ORDER — FUROSEMIDE 40 MG PO TABS
40.0000 mg | ORAL_TABLET | Freq: Every day | ORAL | 1 refills | Status: DC
  Filled 2024-04-27: qty 30, 30d supply, fill #0

## 2024-04-27 MED ORDER — PREDNISONE 20 MG PO TABS
ORAL_TABLET | ORAL | 1 refills | Status: DC
  Filled 2024-04-27: qty 6, 4d supply, fill #0

## 2024-04-27 MED ORDER — EMPAGLIFLOZIN 10 MG PO TABS
10.0000 mg | ORAL_TABLET | Freq: Every day | ORAL | 1 refills | Status: DC
Start: 2024-04-27 — End: 2024-05-31
  Filled 2024-04-27: qty 30, 30d supply, fill #0
  Filled 2024-05-29: qty 30, 30d supply, fill #1

## 2024-04-27 NOTE — Progress Notes (Signed)
 Physical Therapy Treatment Patient Details Name: Mary Chase MRN: [id number] DOB: 20-Apr-1935 Today's Date: 04/27/2024   History of Present Illness 89 y.o. female adm 04/21/24 for worsening dyspnea, wheezing with COPD exacerbation. PMHx: asthma/ COPD, aortic stenosis, CAD, CKD, HLD, sickle call trait, OA, cataract, mitral regurgitation.    PT Comments  Pt eager to get dressed and ready to go home. Pt denied attempting stairs this session and states family can pull her to the stairs and with rail she does "just fine". Pt able to improve transfers and gait trial this date on RA.     If plan is discharge home, recommend the following: A little help with walking and/or transfers;A little help with bathing/dressing/bathroom;Assist for transportation;Help with stairs or ramp for entrance;Assistance with cooking/housework   Can travel by private vehicle        Equipment Recommendations  None recommended by PT    Recommendations for Other Services       Precautions / Restrictions Precautions Precautions: Fall Recall of Precautions/Restrictions: Intact     Mobility  Bed Mobility Overal bed mobility: Modified Independent Bed Mobility: Supine to Sit     Supine to sit: Supervision, HOB elevated     General bed mobility comments: use of rail and HOB 30 degrees with increased time to pivot to EOB    Transfers Overall transfer level: Modified independent                 General transfer comment: pt able to rise from bed and from chair without armrests without assist, increased time    Ambulation/Gait Ambulation/Gait assistance: Contact guard assist Gait Distance (Feet): 75 Feet Assistive device: Rolling walker (2 wheels) Gait Pattern/deviations: Step-through pattern, Shuffle, Trunk flexed   Gait velocity interpretation: 1.31 - 2.62 ft/sec, indicative of limited community ambulator   General Gait Details: trunk flexed with tendency to keep Rt hand on grip and left  forearm on RW. pt fatigued at 75' needing seated rest but maintained SPO2 >93% throughout on RA   Stairs Stairs:  (pt denied attempting)           Wheelchair Mobility     Tilt Bed    Modified Rankin (Stroke Patients Only)       Balance Overall balance assessment: Needs assistance Sitting-balance support: No upper extremity supported, Feet supported Sitting balance-Leahy Scale: Fair Sitting balance - Comments: EOB without support   Standing balance support: Bilateral upper extremity supported, Reliant on assistive device for balance, During functional activity Standing balance-Leahy Scale: Poor Standing balance comment: RW reliant                            Communication Communication Communication: No apparent difficulties  Cognition Arousal: Alert Behavior During Therapy: WFL for tasks assessed/performed   PT - Cognitive impairments: No apparent impairments                         Following commands: Intact      Cueing Cueing Techniques: Verbal cues  Exercises      General Comments        Pertinent Vitals/Pain Pain Assessment Pain Assessment: No/denies pain    Home Living                          Prior Function            PT Goals (current goals can now  be found in the care plan section) Progress towards PT goals: Progressing toward goals    Frequency    Min 2X/week      PT Plan      Co-evaluation              AM-PAC PT "6 Clicks" Mobility   Outcome Measure  Help needed turning from your back to your side while in a flat bed without using bedrails?: None Help needed moving from lying on your back to sitting on the side of a flat bed without using bedrails?: None Help needed moving to and from a bed to a chair (including a wheelchair)?: None Help needed standing up from a chair using your arms (e.g., wheelchair or bedside chair)?: A Little Help needed to walk in hospital room?: A Little Help  needed climbing 3-5 steps with a railing? : A Little 6 Click Score: 21    End of Session   Activity Tolerance: Patient tolerated treatment well Patient left: in chair;with call bell/phone within reach Nurse Communication: Mobility status PT Visit Diagnosis: Other abnormalities of gait and mobility (R26.89);Muscle weakness (generalized) (M62.81)     Time: 4098-1191 PT Time Calculation (min) (ACUTE ONLY): 22 min  Charges:    $Therapeutic Activity: 8-22 mins PT General Charges $$ ACUTE PT VISIT: 1 Visit                     Annis Baseman, PT Acute Rehabilitation Services Office: [phone]    Jackey Mary Madelin Weseman 04/27/2024, 11:47 AM

## 2024-04-27 NOTE — Plan of Care (Signed)

## 2024-04-29 NOTE — Discharge Summary (Signed)
 Physician Discharge Summary  Mary Chase Police ZOX:[id number] DOB: January 27, 1935 DOA: 04/21/2024  PCP: Arva Lathe, MD  Admit date: 04/21/2024 Discharge date: 04/27/2024  Time spent:  Recommendations for Outpatient Follow-up:  PCP in 1 week [facility] in 2 weeks  Discharge Diagnoses:  Principal Problem:   COPD exacerbation (HCC) Active Problems:   Acute on chronic diastolic CHF (congestive heart failure) (HCC)   HTN (hypertension)   CAD (coronary artery disease), native coronary artery   CKD stage 3a, GFR 45-59 ml/min (HCC)   Osteoarthritis of right hip   Obesity, class 1   Discharge Condition: improved  Diet recommendation: low sodium, heart healthy  Filed Weights   04/21/24 2126  Weight: 76.6 kg    History of present illness:  89/F w asthma/ COPD, aortic stenosis, CAD, CKD,  dyslipidemia and sickle call trait who presented with dyspnea.  -worsening dyspnea for the last 3 weeks, initially on exertion and then progressed to having symptoms with minimal efforts. - bun 5 cr 0,93 Mg 1.3  -CXR w cardiomegaly, with mild hilar vascular congestion CT chest/ abdomen/ pelvis, No PE. Cardiomegaly with small pericardial effusion. Diffuse bronchial thickening with scattered bilateral posterior basal subsegmental bronchi plugging.  Ill defined airspace disease. Cholelithiasis.  -5/04 patient was placed on steroids and bronchodilator therapy. One dose of IV furosemide  due to persistent dyspnea.  -5/05 dyspnea has been improved but not back to her baseline.  -5/06 clinically improving but not yet back to baseline, resume oral loop diuretic  Hospital Course:   COPD exacerbation (HCC) Asthma/COPD exacerbation. - Flu/COVID/RSV negative  - treated w/ IV Solu-Medrol , azithromycin , DuoNebs, pulmonary toilet, flutter valve - Switched to oral prednisone  taper - improved and stable, DC home today -FU with PCP in 1 week   Acute on chronic diastolic CHF Pulm HTN -Echo  with EF 60 to 65%, RVpreserved, mild to moderate mitral valve regurgitation. Mild to moderate aortic stenosis. -sp few doses if IV lasix , now resume lasix  40mg  po daily -Continue losartan , Aldactone , Jardiance    HTN (hypertension) Continue  amlodipine  and losartan .    CAD (coronary artery disease), native coronary artery No chest pain, no acute coronary syndrome Continue with statin therapy,  add asa   CKD stage 3a, GFR 45-59 ml/min (HCC) Hypokalemia and hypomagnesemia AKI stable   Osteoarthritis of right hip PT and OT > Home health recommended and will be set up at DC   Obesity, class 1 Calculated BMI is 32.9   Discharge Exam: Vitals:   04/27/24 0859 04/27/24 1139  BP:    Pulse:    Resp:    Temp:    SpO2: 100% 93%   Gen: Awake, Alert, Oriented X 3,  HEENT: no  JVD Lungs: poor air movt CVS: S1S2/RRR Abd: soft, Non tender, non distended, BS present Extremities: Trace edema Skin: no new rashes on exposed skin       Discharge Instructions   Discharge Instructions     Diet - low sodium heart healthy   Complete by: As directed    Increase activity slowly   Complete by: As directed       Allergies as of 04/27/2024       Reactions   Penicillins Shortness Of Breath, Rash   Alendronate Sodium    hair loss, muscle aches, SOB,weight issues   Cephalexin Itching   Sulfa Antibiotics Itching, Other (See Comments)   Swelling in mouth   Sulfacetamide Sodium Itching   Other Itching   Some beans  Medication List     TAKE these medications    acetaminophen  500 MG tablet Commonly known as: TYLENOL  Take 500 mg by mouth in the morning and at bedtime.   albuterol  (2.5 MG/3ML) 0.083% nebulizer solution Commonly known as: PROVENTIL  TAKE 3 ML (2.5 MG TOTAL) BY NEBULIZATION EVERY 4 HOURS AS NEEDED FOR WHEEZING OR SHORTNESS OF BREATH   albuterol  108 (90 Base) MCG/ACT inhaler Commonly known as: VENTOLIN  HFA INHALE 4 PUFFS INTO THE LUNGS EVERY 4 (FOUR) HOURS  AS NEEDED FOR WHEEZING OR SHORTNESS OF BREATH.   amLODipine  10 MG tablet Commonly known as: NORVASC  Take 10 mg by mouth daily.   aspirin  EC 81 MG tablet Take 1 tablet (81 mg total) by mouth 2 (two) times daily.   B-12 5000 MCG Caps Take 5,000 mcg by mouth daily.   fluticasone  50 MCG/ACT nasal spray Commonly known as: FLONASE  PLACE 1 SPRAY INTO BOTH NOSTRILS DAILY AS NEEDED FOR ALLERGIES OR RHINITIS.   furosemide  40 MG tablet Commonly known as: LASIX  Take 1 tablet (40 mg total) by mouth daily. What changed:  medication strength how much to take when to take this   ipratropium-albuterol  0.5-2.5 (3) MG/3ML Soln Commonly known as: DUONEB Take 3 mLs by nebulization every 4 (four) hours as needed.   Jardiance  10 MG Tabs tablet Generic drug: empagliflozin  Take 1 tablet (10 mg total) by mouth daily.   losartan  50 MG tablet Commonly known as: COZAAR  Take 1 tablet (50 mg total) by mouth daily.   montelukast  10 MG tablet Commonly known as: SINGULAIR  Take 1 tablet (10 mg total) by mouth daily.   predniSONE  20 MG tablet Commonly known as: DELTASONE  Take 2 tablets (40mg  total) by mouth once daily for 2 days, then 1 tablet (20mg  total) once daily for 2 days, then STOP.   rosuvastatin  5 MG tablet Commonly known as: CRESTOR  TAKE 1 TABLET BY MOUTH EVERY OTHER DAY   spironolactone  25 MG tablet Commonly known as: ALDACTONE  Take 0.5 tablets (12.5 mg total) by mouth daily.   Trelegy Ellipta  200-62.5-25 MCG/ACT Aepb Generic drug: Fluticasone -Umeclidin-Vilant Inhale 1 puff into the lungs daily.   Vitamin D3 25 MCG (1000 UT) Caps Take 1,000 Units by mouth daily in the afternoon.       Allergies  Allergen Reactions   Penicillins Shortness Of Breath and Rash   Alendronate Sodium     hair loss, muscle aches, SOB,weight issues   Cephalexin Itching   Sulfa Antibiotics Itching and Other (See Comments)    Swelling in mouth   Sulfacetamide Sodium Itching   Other Itching    Some  beans     Follow-up Information     [facility].   Specialty: Home Health Services Why: [facility] will provide home health services.  They will call you in the next 7-10 days to set up services. Contact information: [address] [place] [address] [phone]                  The results of significant diagnostics from this hospitalization (including imaging, microbiology, ancillary and laboratory) are listed below for reference.    Significant Diagnostic Studies: ECHOCARDIOGRAM COMPLETE Result Date: 04/24/2024    ECHOCARDIOGRAM REPORT   Patient Name:   Mary Chase Date of Exam: 04/24/2024 Medical Rec #:  [id number]        Height:       60.0 in Accession #:    [id number]  Weight:       168.9 lb Date of Birth:  [birth year]       BSA:          1.737 m Patient Age:    88 years         BP:           133/76 mmHg Patient Gender: F                HR:           107 bpm. Exam Location:  Inpatient Procedure: 2D Echo, Cardiac Doppler and Color Doppler (Both Spectral and Color            Flow Doppler were utilized during procedure). Indications:    Dyspnea R06.00  History:        Patient has prior history of Echocardiogram examinations, most                 recent 06/04/2023. CHF, CAD, Pulmonary HTN, COPD and CKD, stage                 3; Risk Factors:Hypertension.  Sonographer:    Terrilee Few RCS Referring Phys: [id number] MAURICIO DANIEL ARRIEN IMPRESSIONS  1. Left ventricular ejection fraction, by estimation, is 60 to 65%. The left ventricle has normal function. The left ventricle has no regional wall motion abnormalities. Left ventricular diastolic parameters were normal.  2. Right ventricular systolic function is normal. The right ventricular size is normal. There is moderately elevated pulmonary artery systolic pressure. The estimated right ventricular systolic pressure is 57.0 mmHg.  3. Left atrial size was mildly dilated.  4. Right atrial  size was mildly dilated.  5. The mitral valve is normal in structure. Mild to moderate mitral valve regurgitation. No evidence of mitral stenosis.  6. Aortic stenosis mild by gradients, but low flow by stroke volume, so likely underestimated. Moderate stenosis by valve area and DI. The aortic valve is tricuspid. There is moderate calcification of the aortic valve. There is moderate thickening of the aortic valve. Aortic valve regurgitation is not visualized. Mild to moderate aortic valve stenosis. Aortic valve mean gradient measures 16.0 mmHg.  7. Aortic dilatation noted. There is borderline dilatation of the ascending aorta, measuring 38 mm.  8. The inferior vena cava is dilated in size with <50% respiratory variability, suggesting right atrial pressure of 15 mmHg. Comparison(s): Prior images reviewed side by side. Conclusion(s)/Recommendation(s): Normal LVEF, normal diastolic parameters on current study, moderate MR, mild-moderate AS, elevated RA and RV pressures. FINDINGS  Left Ventricle: Left ventricular ejection fraction, by estimation, is 60 to 65%. The left ventricle has normal function. The left ventricle has no regional wall motion abnormalities. The left ventricular internal cavity size was normal in size. There is  no left ventricular hypertrophy. Left ventricular diastolic parameters were normal. Right Ventricle: The right ventricular size is normal. No increase in right ventricular wall thickness. Right ventricular systolic function is normal. There is moderately elevated pulmonary artery systolic pressure. The tricuspid regurgitant velocity is 3.24 m/s, and with an assumed right atrial pressure of 15 mmHg, the estimated right ventricular systolic pressure is 57.0 mmHg. Left Atrium: Left atrial size was mildly dilated. Right Atrium: Right atrial size was mildly dilated. Pericardium: Trivial pericardial effusion is present. Mitral Valve: The mitral valve is normal in structure. Mild to moderate mitral  valve regurgitation. No evidence of mitral valve stenosis. Tricuspid Valve: The tricuspid valve is normal in structure. Tricuspid valve regurgitation is mild . No  evidence of tricuspid stenosis. Aortic Valve: Aortic stenosis mild by gradients, but low flow by stroke volume, so likely underestimated. Moderate stenosis by valve area and DI. The aortic valve is tricuspid. There is moderate calcification of the aortic valve. There is moderate thickening of the aortic valve. Aortic valve regurgitation is not visualized. Mild to moderate aortic stenosis is present. Aortic valve mean gradient measures 16.0 mmHg. Aortic valve peak gradient measures 28.1 mmHg. Aortic valve area, by VTI measures 1.01 cm. Pulmonic Valve: The pulmonic valve was not well visualized. Pulmonic valve regurgitation is not visualized. No evidence of pulmonic stenosis. Aorta: Aortic dilatation noted. There is borderline dilatation of the ascending aorta, measuring 38 mm. Venous: The inferior vena cava is dilated in size with less than 50% respiratory variability, suggesting right atrial pressure of 15 mmHg. IAS/Shunts: The atrial septum is grossly normal.  LEFT VENTRICLE PLAX 2D LVIDd:         3.20 cm   Diastology LVIDs:         2.00 cm   LV e' medial:    8.16 cm/s LV PW:         1.00 cm   LV E/e' medial:  13.8 LV IVS:        1.20 cm   LV e' lateral:   9.03 cm/s LVOT diam:     1.80 cm   LV E/e' lateral: 12.5 LV SV:         50 LV SV Index:   29 LVOT Area:     2.54 cm  RIGHT VENTRICLE             IVC RV S prime:     23.90 cm/s  IVC diam: 2.50 cm TAPSE (M-mode): 2.3 cm LEFT ATRIUM             Index        RIGHT ATRIUM           Index LA diam:        3.40 cm 1.96 cm/m   RA Area:     17.50 cm LA Vol (A2C):   42.8 ml 24.66 ml/m  RA Volume:   47.50 ml  27.34 ml/m LA Vol (A4C):   35.3 ml 20.32 ml/m LA Biplane Vol: 39.5 ml 22.74 ml/m  AORTIC VALVE AV Area (Vmax):    1.04 cm AV Area (Vmean):   0.97 cm AV Area (VTI):     1.01 cm AV Vmax:            265.00 cm/s AV Vmean:          187.333 cm/s AV VTI:            0.493 m AV Peak Grad:      28.1 mmHg AV Mean Grad:      16.0 mmHg LVOT Vmax:         108.00 cm/s LVOT Vmean:        71.300 cm/s LVOT VTI:          0.195 m LVOT/AV VTI ratio: 0.40  AORTA Ao Root diam: 3.20 cm Ao Asc diam:  3.80 cm MITRAL VALVE                TRICUSPID VALVE MV Area (PHT): 5.52 cm     TR Peak grad:   42.0 mmHg MV Decel Time: 138 msec     TR Vmax:        324.00 cm/s MR Peak grad: 106.5 mmHg MR Vmax:  516.00 cm/s   SHUNTS MV E velocity: 112.50 cm/s  Systemic VTI:  0.20 m MV A velocity: 147.00 cm/s  Systemic Diam: 1.80 cm MV E/A ratio:  0.77 Sheryle Donning MD Electronically signed by Sheryle Donning MD Signature Date/Time: 04/24/2024/6:22:49 PM    Final    CT Angio Chest PE W and/or Wo Contrast Result Date: 04/22/2024 CLINICAL DATA:  Progressively worsening shortness of breath with abdominal pain, acute, nonlocalized. EXAM: CT ANGIOGRAPHY CHEST CT ABDOMEN AND PELVIS WITH CONTRAST TECHNIQUE: Multidetector CT imaging of the chest was performed using the standard protocol during bolus administration of intravenous contrast. Multiplanar CT image reconstructions and MIPs were obtained to evaluate the vascular anatomy. Multidetector CT imaging of the abdomen and pelvis was performed using the standard protocol during bolus administration of intravenous contrast. RADIATION DOSE REDUCTION: This exam was performed according to the departmental dose-optimization program which includes automated exposure control, adjustment of the mA and/or kV according to patient size and/or use of iterative reconstruction technique. CONTRAST:  75mL OMNIPAQUE  IOHEXOL  350 MG/ML SOLN COMPARISON:  Portable chest yesterday, chest PA Lat 03/03/2024, partial chest CT for coronary CTA 06/29/2023, CT abdomen pelvis with contrast 08/28/2022. FINDINGS: CTA CHEST FINDINGS Cardiovascular: Arterial opacification is diagnostic. No arterial embolus or dilatation is  seen. There is mild cardiomegaly with small pericardial effusion again noted. There are minimal three-vessel coronary calcifications. The pulmonary veins are normal caliber. There is mild aortic tortuosity, calcific plaques in the aortic arch. There is no aneurysm, stenosis or dissection. The great vessels are clear. Mediastinum/Nodes: There are mildly prominent mediastinal and hilar lymph nodes which were present on the prior study and unchanged. There is a subcarinal lymph node measuring 1.3 cm in short axis, left hilar lymph nodes up to 1.2 cm in short axis, right hilar nodes to 1.1 cm. Above the plane of the prior study there is a similar mildly prominent AP window node measuring 1.1 cm. There is no bulky or encasing intrathoracic adenopathy. No enlarged axillary space nodes. There is no thyroid  nodule. Thoracic trachea and main bronchi are clear. Unremarkable thoracic esophagus. Lungs/Pleura: Diffuse bronchial thickening. There is scattered bilateral posterior basal subsegmental bronchial plugging not seen previously. There is ill-defined airspace disease in a small area in the anterior segment of the right upper lobe, probably due to pneumonia. There is increased consolidation in the dorsal lingular base which could be atelectasis or pneumonia. There are coarse atelectatic changes in both lower lobes. No other focal infiltrates are seen. There is no pleural effusion or visible nodule. Musculoskeletal: Osteopenia and advanced degenerative change thoracic spine, moderately advanced shoulder arthrosis. There are benign calcifications in the left breast. No masses seen in the visualized chest wall. Review of the MIP images confirms the above findings. CT ABDOMEN and PELVIS FINDINGS Hepatobiliary: No focal liver abnormality is seen. There are either several noncalcified stones in the gallbladder versus sludge balls visible. The gallbladder is mildly dilated but without wall thickening or pericholecystic edema.  Pancreas: Unremarkable. No pancreatic ductal dilatation or surrounding inflammatory changes. Spleen: No abnormality. Adrenals/Urinary Tract: Thinning of the bilateral renal cortex appears similar. No adrenal mass. Again noted is a 1 cm Bosniak 1 cyst in the inferior pole of the right kidney, Hounsfield density is -0.8. No follow-up imaging is recommended. There is no renal mass enhancement bilaterally. Perinephric stranding is chronic and unchanged. There is no urinary stone or obstruction. The right side of the bladder is obscured by spray artifact from a right hip arthroplasty. The bladder is  unremarkable where visible. Stomach/Bowel: No dilatation or wall thickening, including the appendix. Moderate fecal stasis. Scattered colonic diverticulosis without evidence of diverticulitis. Vascular/Lymphatic: Aortic atherosclerosis. No enlarged abdominal or pelvic lymph nodes. Reproductive: 2.3 cm transmural fibroid again noted right body of uterus. Calcified fibroid in the fundus. Adnexal mass is seen. Other: No abdominal wall hernia or abnormality. No abdominopelvic ascites. Musculoskeletal: Advanced degenerative change lumbar spine with osteopenia. Severe acquired spinal canal stenosis at L2-3, L3-4, L4-5 with multilevel acquired lumbar foraminal stenosis. Right hip arthroplasty. Advanced left hip DJD Review of the MIP images confirms the above findings. IMPRESSION: 1. No evidence of arterial embolus or aortic dissection. 2. Cardiomegaly with small pericardial effusion. 3. Aortic and coronary artery atherosclerosis. 4. Diffuse bronchial thickening with scattered bilateral posterior basal subsegmental bronchial plugging. 5. Ill-defined airspace disease in the anterior segment of the right upper lobe, probably due to pneumonia. 6. Increased consolidation in the dorsal lingular base which could be atelectasis or pneumonia. Short interval follow-up chest CT recommended to ensure clearing. 7. Constipation and  diverticulosis. 8. Cholelithiasis versus sludge balls in the gallbladder with mild gallbladder dilatation but no wall thickening or pericholecystic edema. 9. Fibroid uterus. 10. Osteopenia and degenerative change. Severe acquired spinal canal stenosis at L2-3, L3-4, L4-5 with multilevel acquired lumbar foraminal stenosis. Aortic Atherosclerosis (ICD10-I70.0). Electronically Signed   By: Denman Fischer M.D.   On: 04/22/2024 01:52   CT ABDOMEN PELVIS W CONTRAST Result Date: 04/22/2024 CLINICAL DATA:  Progressively worsening shortness of breath with abdominal pain, acute, nonlocalized. EXAM: CT ANGIOGRAPHY CHEST CT ABDOMEN AND PELVIS WITH CONTRAST TECHNIQUE: Multidetector CT imaging of the chest was performed using the standard protocol during bolus administration of intravenous contrast. Multiplanar CT image reconstructions and MIPs were obtained to evaluate the vascular anatomy. Multidetector CT imaging of the abdomen and pelvis was performed using the standard protocol during bolus administration of intravenous contrast. RADIATION DOSE REDUCTION: This exam was performed according to the departmental dose-optimization program which includes automated exposure control, adjustment of the mA and/or kV according to patient size and/or use of iterative reconstruction technique. CONTRAST:  75mL OMNIPAQUE  IOHEXOL  350 MG/ML SOLN COMPARISON:  Portable chest yesterday, chest PA Lat 03/03/2024, partial chest CT for coronary CTA 06/29/2023, CT abdomen pelvis with contrast 08/28/2022. FINDINGS: CTA CHEST FINDINGS Cardiovascular: Arterial opacification is diagnostic. No arterial embolus or dilatation is seen. There is mild cardiomegaly with small pericardial effusion again noted. There are minimal three-vessel coronary calcifications. The pulmonary veins are normal caliber. There is mild aortic tortuosity, calcific plaques in the aortic arch. There is no aneurysm, stenosis or dissection. The great vessels are clear.  Mediastinum/Nodes: There are mildly prominent mediastinal and hilar lymph nodes which were present on the prior study and unchanged. There is a subcarinal lymph node measuring 1.3 cm in short axis, left hilar lymph nodes up to 1.2 cm in short axis, right hilar nodes to 1.1 cm. Above the plane of the prior study there is a similar mildly prominent AP window node measuring 1.1 cm. There is no bulky or encasing intrathoracic adenopathy. No enlarged axillary space nodes. There is no thyroid  nodule. Thoracic trachea and main bronchi are clear. Unremarkable thoracic esophagus. Lungs/Pleura: Diffuse bronchial thickening. There is scattered bilateral posterior basal subsegmental bronchial plugging not seen previously. There is ill-defined airspace disease in a small area in the anterior segment of the right upper lobe, probably due to pneumonia. There is increased consolidation in the dorsal lingular base which could be atelectasis or pneumonia. There are  coarse atelectatic changes in both lower lobes. No other focal infiltrates are seen. There is no pleural effusion or visible nodule. Musculoskeletal: Osteopenia and advanced degenerative change thoracic spine, moderately advanced shoulder arthrosis. There are benign calcifications in the left breast. No masses seen in the visualized chest wall. Review of the MIP images confirms the above findings. CT ABDOMEN and PELVIS FINDINGS Hepatobiliary: No focal liver abnormality is seen. There are either several noncalcified stones in the gallbladder versus sludge balls visible. The gallbladder is mildly dilated but without wall thickening or pericholecystic edema. Pancreas: Unremarkable. No pancreatic ductal dilatation or surrounding inflammatory changes. Spleen: No abnormality. Adrenals/Urinary Tract: Thinning of the bilateral renal cortex appears similar. No adrenal mass. Again noted is a 1 cm Bosniak 1 cyst in the inferior pole of the right kidney, Hounsfield density is -0.8. No  follow-up imaging is recommended. There is no renal mass enhancement bilaterally. Perinephric stranding is chronic and unchanged. There is no urinary stone or obstruction. The right side of the bladder is obscured by spray artifact from a right hip arthroplasty. The bladder is unremarkable where visible. Stomach/Bowel: No dilatation or wall thickening, including the appendix. Moderate fecal stasis. Scattered colonic diverticulosis without evidence of diverticulitis. Vascular/Lymphatic: Aortic atherosclerosis. No enlarged abdominal or pelvic lymph nodes. Reproductive: 2.3 cm transmural fibroid again noted right body of uterus. Calcified fibroid in the fundus. Adnexal mass is seen. Other: No abdominal wall hernia or abnormality. No abdominopelvic ascites. Musculoskeletal: Advanced degenerative change lumbar spine with osteopenia. Severe acquired spinal canal stenosis at L2-3, L3-4, L4-5 with multilevel acquired lumbar foraminal stenosis. Right hip arthroplasty. Advanced left hip DJD Review of the MIP images confirms the above findings. IMPRESSION: 1. No evidence of arterial embolus or aortic dissection. 2. Cardiomegaly with small pericardial effusion. 3. Aortic and coronary artery atherosclerosis. 4. Diffuse bronchial thickening with scattered bilateral posterior basal subsegmental bronchial plugging. 5. Ill-defined airspace disease in the anterior segment of the right upper lobe, probably due to pneumonia. 6. Increased consolidation in the dorsal lingular base which could be atelectasis or pneumonia. Short interval follow-up chest CT recommended to ensure clearing. 7. Constipation and diverticulosis. 8. Cholelithiasis versus sludge balls in the gallbladder with mild gallbladder dilatation but no wall thickening or pericholecystic edema. 9. Fibroid uterus. 10. Osteopenia and degenerative change. Severe acquired spinal canal stenosis at L2-3, L3-4, L4-5 with multilevel acquired lumbar foraminal stenosis. Aortic  Atherosclerosis (ICD10-I70.0). Electronically Signed   By: Denman Fischer M.D.   On: 04/22/2024 01:52   DG Chest Port 1 View Result Date: 04/21/2024 CLINICAL DATA:  Cough, asthma, right-sided pain EXAM: PORTABLE CHEST 1 VIEW COMPARISON:  03/03/2024 FINDINGS: Stable cardiomegaly. Pulmonary vascular congestion. Bibasilar atelectasis or infiltrates. No pleural effusion or pneumothorax. Advanced arthritis both shoulders. IMPRESSION: 1. Cardiomegaly with pulmonary vascular congestion. 2. Bibasilar atelectasis or infiltrates. Electronically Signed   By: Rozell Cornet M.D.   On: 04/21/2024 22:09    Microbiology: Recent Results (from the past 240 hours)  Resp panel by RT-PCR (RSV, Flu A&B, Covid) Anterior Nasal Swab     Status: None   Collection Time: 04/21/24  9:51 PM   Specimen: Anterior Nasal Swab  Result Value Ref Range Status   SARS Coronavirus 2 by RT PCR NEGATIVE NEGATIVE Final   Influenza A by PCR NEGATIVE NEGATIVE Final   Influenza B by PCR NEGATIVE NEGATIVE Final    Comment: (NOTE) The Xpert Xpress SARS-CoV-2/FLU/RSV plus assay is intended as an aid in the diagnosis of influenza from Nasopharyngeal swab specimens and  should not be used as a sole basis for treatment. Nasal washings and aspirates are unacceptable for Xpert Xpress SARS-CoV-2/FLU/RSV testing.  Fact Sheet for Patients: BloggerCourse.com  Fact Sheet for Healthcare Providers: SeriousBroker.it  This test is not yet approved or cleared by the [place]  FDA and has been authorized for detection and/or diagnosis of SARS-CoV-2 by FDA under an Emergency Use Authorization (EUA). This EUA will remain in effect (meaning this test can be used) for the duration of the COVID-19 declaration under Section 564(b)(1) of the Act, 21 U.S.C. section 360bbb-3(b)(1), unless the authorization is terminated or revoked.     Resp Syncytial Virus by PCR NEGATIVE NEGATIVE Final    Comment:  (NOTE) Fact Sheet for Patients: BloggerCourse.com  Fact Sheet for Healthcare Providers: SeriousBroker.it  This test is not yet approved or cleared by the [place]  FDA and has been authorized for detection and/or diagnosis of SARS-CoV-2 by FDA under an Emergency Use Authorization (EUA). This EUA will remain in effect (meaning this test can be used) for the duration of the COVID-19 declaration under Section 564(b)(1) of the Act, 21 U.S.C. section 360bbb-3(b)(1), unless the authorization is terminated or revoked.  Performed at [facility], [address], [place], [place] [address]      Labs: Basic Metabolic Panel: Recent Labs  Lab 04/22/24 1435 04/23/24 0524 04/24/24 0415 04/25/24 0556 04/26/24 0556 04/27/24 0730  NA 137 139 136 137 136 137  K 3.5 4.4 4.7 4.7 4.9 4.8  CL 101 104 103 103 100 98  CO2 24 24 24 24 28 30   GLUCOSE 185* 151* 161* 155* 241* 97  BUN 13 16 24* 26* 22 20  CREATININE 1.24* 1.19* 1.27* 1.17* 1.23* 1.09*  CALCIUM  9.3 9.5 9.1 9.3 9.3 9.8  MG 2.0 2.2 2.2 2.2  --   --    Liver Function Tests: No results for input(s): "AST", "ALT", "ALKPHOS", "BILITOT", "PROT", "ALBUMIN" in the last 168 hours. No results for input(s): "LIPASE", "AMYLASE" in the last 168 hours. No results for input(s): "AMMONIA" in the last 168 hours. CBC: No results for input(s): "WBC", "NEUTROABS", "HGB", "HCT", "MCV", "PLT" in the last 168 hours. Cardiac Enzymes: No results for input(s): "CKTOTAL", "CKMB", "CKMBINDEX", "TROPONINI" in the last 168 hours. BNP: BNP (last 3 results) Recent Labs    05/08/23 0346 07/05/23 1219 04/21/24 2240  BNP 37.8 17.4 34.0    ProBNP (last 3 results) No results for input(s): "PROBNP" in the last 8760 hours.  CBG: No results for input(s): "GLUCAP" in the last 168 hours.     Signed:  Deforest Fast MD.  [facility] 04/29/2024, 1:32 PM

## 2024-05-02 DIAGNOSIS — Z7982 Long term (current) use of aspirin: Secondary | ICD-10-CM | POA: Diagnosis not present

## 2024-05-02 DIAGNOSIS — E876 Hypokalemia: Secondary | ICD-10-CM | POA: Diagnosis not present

## 2024-05-02 DIAGNOSIS — K219 Gastro-esophageal reflux disease without esophagitis: Secondary | ICD-10-CM | POA: Diagnosis not present

## 2024-05-02 DIAGNOSIS — J45901 Unspecified asthma with (acute) exacerbation: Secondary | ICD-10-CM | POA: Diagnosis not present

## 2024-05-02 DIAGNOSIS — N1831 Chronic kidney disease, stage 3a: Secondary | ICD-10-CM | POA: Diagnosis not present

## 2024-05-02 DIAGNOSIS — H35039 Hypertensive retinopathy, unspecified eye: Secondary | ICD-10-CM | POA: Diagnosis not present

## 2024-05-02 DIAGNOSIS — I272 Pulmonary hypertension, unspecified: Secondary | ICD-10-CM | POA: Diagnosis not present

## 2024-05-02 DIAGNOSIS — I5033 Acute on chronic diastolic (congestive) heart failure: Secondary | ICD-10-CM | POA: Diagnosis not present

## 2024-05-02 DIAGNOSIS — Z7951 Long term (current) use of inhaled steroids: Secondary | ICD-10-CM | POA: Diagnosis not present

## 2024-05-02 DIAGNOSIS — M810 Age-related osteoporosis without current pathological fracture: Secondary | ICD-10-CM | POA: Diagnosis not present

## 2024-05-02 DIAGNOSIS — E7800 Pure hypercholesterolemia, unspecified: Secondary | ICD-10-CM | POA: Diagnosis not present

## 2024-05-02 DIAGNOSIS — I70213 Atherosclerosis of native arteries of extremities with intermittent claudication, bilateral legs: Secondary | ICD-10-CM | POA: Diagnosis not present

## 2024-05-02 DIAGNOSIS — R7303 Prediabetes: Secondary | ICD-10-CM | POA: Diagnosis not present

## 2024-05-02 DIAGNOSIS — I080 Rheumatic disorders of both mitral and aortic valves: Secondary | ICD-10-CM | POA: Diagnosis not present

## 2024-05-02 DIAGNOSIS — Z7984 Long term (current) use of oral hypoglycemic drugs: Secondary | ICD-10-CM | POA: Diagnosis not present

## 2024-05-02 DIAGNOSIS — I2510 Atherosclerotic heart disease of native coronary artery without angina pectoris: Secondary | ICD-10-CM | POA: Diagnosis not present

## 2024-05-02 DIAGNOSIS — I130 Hypertensive heart and chronic kidney disease with heart failure and stage 1 through stage 4 chronic kidney disease, or unspecified chronic kidney disease: Secondary | ICD-10-CM | POA: Diagnosis not present

## 2024-05-02 DIAGNOSIS — J441 Chronic obstructive pulmonary disease with (acute) exacerbation: Secondary | ICD-10-CM | POA: Diagnosis not present

## 2024-05-02 DIAGNOSIS — N179 Acute kidney failure, unspecified: Secondary | ICD-10-CM | POA: Diagnosis not present

## 2024-05-10 DIAGNOSIS — I10 Essential (primary) hypertension: Secondary | ICD-10-CM | POA: Diagnosis not present

## 2024-05-10 DIAGNOSIS — I517 Cardiomegaly: Secondary | ICD-10-CM | POA: Diagnosis not present

## 2024-05-10 DIAGNOSIS — J449 Chronic obstructive pulmonary disease, unspecified: Secondary | ICD-10-CM | POA: Diagnosis not present

## 2024-05-10 DIAGNOSIS — I5032 Chronic diastolic (congestive) heart failure: Secondary | ICD-10-CM | POA: Diagnosis not present

## 2024-05-10 DIAGNOSIS — I70213 Atherosclerosis of native arteries of extremities with intermittent claudication, bilateral legs: Secondary | ICD-10-CM | POA: Diagnosis not present

## 2024-05-12 DIAGNOSIS — I5033 Acute on chronic diastolic (congestive) heart failure: Secondary | ICD-10-CM | POA: Diagnosis not present

## 2024-05-12 DIAGNOSIS — Z7984 Long term (current) use of oral hypoglycemic drugs: Secondary | ICD-10-CM | POA: Diagnosis not present

## 2024-05-12 DIAGNOSIS — H35039 Hypertensive retinopathy, unspecified eye: Secondary | ICD-10-CM | POA: Diagnosis not present

## 2024-05-12 DIAGNOSIS — Z7982 Long term (current) use of aspirin: Secondary | ICD-10-CM | POA: Diagnosis not present

## 2024-05-12 DIAGNOSIS — I70213 Atherosclerosis of native arteries of extremities with intermittent claudication, bilateral legs: Secondary | ICD-10-CM | POA: Diagnosis not present

## 2024-05-12 DIAGNOSIS — Z7951 Long term (current) use of inhaled steroids: Secondary | ICD-10-CM | POA: Diagnosis not present

## 2024-05-12 DIAGNOSIS — N1831 Chronic kidney disease, stage 3a: Secondary | ICD-10-CM | POA: Diagnosis not present

## 2024-05-12 DIAGNOSIS — J441 Chronic obstructive pulmonary disease with (acute) exacerbation: Secondary | ICD-10-CM | POA: Diagnosis not present

## 2024-05-12 DIAGNOSIS — I2510 Atherosclerotic heart disease of native coronary artery without angina pectoris: Secondary | ICD-10-CM | POA: Diagnosis not present

## 2024-05-12 DIAGNOSIS — E7800 Pure hypercholesterolemia, unspecified: Secondary | ICD-10-CM | POA: Diagnosis not present

## 2024-05-12 DIAGNOSIS — I080 Rheumatic disorders of both mitral and aortic valves: Secondary | ICD-10-CM | POA: Diagnosis not present

## 2024-05-12 DIAGNOSIS — K219 Gastro-esophageal reflux disease without esophagitis: Secondary | ICD-10-CM | POA: Diagnosis not present

## 2024-05-12 DIAGNOSIS — I130 Hypertensive heart and chronic kidney disease with heart failure and stage 1 through stage 4 chronic kidney disease, or unspecified chronic kidney disease: Secondary | ICD-10-CM | POA: Diagnosis not present

## 2024-05-12 DIAGNOSIS — J45901 Unspecified asthma with (acute) exacerbation: Secondary | ICD-10-CM | POA: Diagnosis not present

## 2024-05-12 DIAGNOSIS — I272 Pulmonary hypertension, unspecified: Secondary | ICD-10-CM | POA: Diagnosis not present

## 2024-05-12 DIAGNOSIS — M810 Age-related osteoporosis without current pathological fracture: Secondary | ICD-10-CM | POA: Diagnosis not present

## 2024-05-12 DIAGNOSIS — N179 Acute kidney failure, unspecified: Secondary | ICD-10-CM | POA: Diagnosis not present

## 2024-05-12 DIAGNOSIS — R7303 Prediabetes: Secondary | ICD-10-CM | POA: Diagnosis not present

## 2024-05-12 DIAGNOSIS — E876 Hypokalemia: Secondary | ICD-10-CM | POA: Diagnosis not present

## 2024-05-16 ENCOUNTER — Other Ambulatory Visit: Payer: Self-pay

## 2024-05-16 ENCOUNTER — Ambulatory Visit: Attending: Cardiology | Admitting: Cardiology

## 2024-05-16 VITALS — Ht 59.5 in | Wt 159.0 lb

## 2024-05-16 DIAGNOSIS — M25471 Effusion, right ankle: Secondary | ICD-10-CM | POA: Diagnosis not present

## 2024-05-16 DIAGNOSIS — R0602 Shortness of breath: Secondary | ICD-10-CM

## 2024-05-16 DIAGNOSIS — I2510 Atherosclerotic heart disease of native coronary artery without angina pectoris: Secondary | ICD-10-CM | POA: Diagnosis not present

## 2024-05-16 DIAGNOSIS — I340 Nonrheumatic mitral (valve) insufficiency: Secondary | ICD-10-CM | POA: Diagnosis not present

## 2024-05-16 DIAGNOSIS — Z79899 Other long term (current) drug therapy: Secondary | ICD-10-CM | POA: Diagnosis not present

## 2024-05-16 DIAGNOSIS — E785 Hyperlipidemia, unspecified: Secondary | ICD-10-CM | POA: Diagnosis not present

## 2024-05-16 DIAGNOSIS — I10 Essential (primary) hypertension: Secondary | ICD-10-CM

## 2024-05-16 DIAGNOSIS — I272 Pulmonary hypertension, unspecified: Secondary | ICD-10-CM

## 2024-05-16 DIAGNOSIS — I350 Nonrheumatic aortic (valve) stenosis: Secondary | ICD-10-CM | POA: Diagnosis not present

## 2024-05-16 DIAGNOSIS — M25472 Effusion, left ankle: Secondary | ICD-10-CM

## 2024-05-16 NOTE — Progress Notes (Signed)
 Cardiology  Note    Date:  05/16/2024   ID:  Mary Chase, DOB 1935/07/26, MRN [id number]  PCP:  Arva Lathe, MD  Cardiologist:  Gaylyn Keas, MD   Chief Complaint  Patient presents with   Follow-up    Dyspnea on exertion, pulmonary hypertension, hypertension, lower extremity edema, coronary artery calcifications, mitral regurgitation, hyperlipidemia, aortic stenosis     History of Present Illness:  Mary Chase is a 89 y.o. female  with a history of PAD, CKD stage III, COPD, diabetes mellitus complicated by renal disease, GERD, hyperlipidemia, hypertension and sickle cell anemia who was initially referred for evaluation of shortness of breath.    She was  hospitalized and discharged on 05/11/2023 after admission for acute hypoxic respiratory failure secondary to severe asthma exacerbation caused by Para Influenza viral infection.  She was started on IV steroids and transition to oral steroids. Her troponin was normal.   She subsequently was referred to cardiology to make sure there was not a cardiac etiology for her shortness of breath.  Apparently she was told years ago that she had an enlarged heart but she has not been seen by a Cardiologist and has not had any echo done. She has chronic SOB due to COPD and asthma but lately has been occurring on a daily basis and much worse with exertion.  This is new compared to when she had her asthma attacks.  Coronary CTA was done 06/29/2023 which demonstrated a coronary calcium  score 452 which was 73rd percentile for age, gender and sex matched controls as well as mild nonobstructive CAD with less than 25% stenosis of the proximal to mid LAD, 25 to 49% mixed plaque in the proximal RCA and normal left main and left circumflex.  2D echo 06/04/2023 showed EF 60 to 65% with grade 1 diastolic dysfunction mild pulmonary hypertension with PASP 44.5 mmHg, moderate biatrial enlargement, moderate to severe MR, moderate TR and mild aortic  valve stenosis with a mean aortic valve gradient 9 mmHg.  She also had a mildly dilated ascending aorta at 40 mm.  She was seen by Marlyse Single, PA to follow-up on her echo results and recommend TEE.  TEE 07/2023 demonstrated mild to moderate MR with a 3D VCA 0.27 cm consistent with moderate MR with systolic dominant flow in all 4 pulmonary veins.  EF 60 to 65% with normal RV.  Aortic valve moderately calcified with no aortic stenosis.  2D echo 04/24/2024 showed 04/24/2024 with EF 60 to 65%, normal RV size and systolic function with moderate pulmonary hypertension PASP 57 mmHg and mean aortic valve gradient of 16 mmHg with V-max 2.65 m/s in the setting of low SVI of 29 and a DVI of 0.4 more consistent with low-flow low gradient moderate aortic stenosis.  She is here today for followup and is doing well.  She denies any chest pain or pressure, SOB, DOE, PND, orthopnea, LE edema, dizziness, palpitations or syncope. She is compliant with her meds and is tolerating meds with no SE.    Past Medical History:  Diagnosis Date   Age related osteoporosis    Allergic rhinitis    Aortic stenosis    Mild by echo 05/2023   Arthritis    Ascending aorta dilatation (HCC)    2D echo 06/10/2023 with ascending aorta measurement 40 mm   Asthma    Atherosclerosis of native artery of both lower extremities with intermittent claudication (HCC)    CAD (coronary artery disease), native  coronary artery    coronary Ca score of 452 with <25% stenosis of prox to mid LAD and 25-49% prox RCA by coronary CTA 7/24   Cataract    Chronic kidney disease, stage 3a (HCC)    COPD (chronic obstructive pulmonary disease) (HCC)    Degenerative joint disease (DJD) of hip    Dyspnea    with exertion   GERD (gastroesophageal reflux disease)    Headache    Hypercholesterolemia    Hypertension    Hypertensive retinopathy    Mitral regurgitation    Moderate to severe by echo 05/2023   Osteoarthritis of hip    Pneumonia    Pre-diabetes     A1c within normal limits last check   Pulmonary hypertension (HCC)    Mild to moderate with PASP 44 mmHg by echo 6-24   PVD (peripheral vascular disease) (HCC)    Sickle cell anemia (HCC)    trait   Sickle cell trait (HCC)    Tricuspid regurgitation    Moderate by echo 06/10/2023    Past Surgical History:  Procedure Laterality Date   ANKLE SURGERY Right 2002   pin   BREAST BIOPSY Right 11/01/2013   Procedure: RIGHT BREAST MASS EXCISION;  Surgeon: Enid Harry, MD;  Location: [facility] OR;  Service: General;  Laterality: Right;   CATARACT EXTRACTION     ELBOW SURGERY Right    pin   MULTIPLE TOOTH EXTRACTIONS     TEE WITHOUT CARDIOVERSION N/A 08/03/2023   Procedure: TRANSESOPHAGEAL ECHOCARDIOGRAM;  Surgeon: Harrold Lincoln, MD;  Location: [facility] INVASIVE CV LAB;  Service: Cardiovascular;  Laterality: N/A;   TOTAL HIP ARTHROPLASTY Right 09/16/2020   Procedure: RIGHT TOTAL HIP ARTHROPLASTY ANTERIOR APPROACH;  Surgeon: Wendolyn Hamburger, MD;  Location: [facility] ORS;  Service: Orthopedics;  Laterality: Right;   TUBAL LIGATION      Current Medications: Current Meds  Medication Sig   acetaminophen  (TYLENOL ) 500 MG tablet Take 500 mg by mouth in the morning and at bedtime.   albuterol  (PROVENTIL ) (2.5 MG/3ML) 0.083% nebulizer solution TAKE 3 ML (2.5 MG TOTAL) BY NEBULIZATION EVERY 4 HOURS AS NEEDED FOR WHEEZING OR SHORTNESS OF BREATH   albuterol  (VENTOLIN  HFA) 108 (90 Base) MCG/ACT inhaler INHALE 4 PUFFS INTO THE LUNGS EVERY 4 (FOUR) HOURS AS NEEDED FOR WHEEZING OR SHORTNESS OF BREATH.   amLODipine  (NORVASC ) 10 MG tablet Take 10 mg by mouth daily.   aspirin  EC 81 MG tablet Take 1 tablet (81 mg total) by mouth 2 (two) times daily.   Cholecalciferol (VITAMIN D3) 25 MCG (1000 UT) CAPS Take 1,000 Units by mouth daily in the afternoon.   Cyanocobalamin (B-12) 5000 MCG CAPS Take 5,000 mcg by mouth daily.   empagliflozin  (JARDIANCE ) 10 MG TABS tablet Take 1 tablet (10 mg total) by mouth daily.    fluticasone  (FLONASE ) 50 MCG/ACT nasal spray PLACE 1 SPRAY INTO BOTH NOSTRILS DAILY AS NEEDED FOR ALLERGIES OR RHINITIS.   Fluticasone -Umeclidin-Vilant (TRELEGY ELLIPTA ) 200-62.5-25 MCG/ACT AEPB Inhale 1 puff into the lungs daily.   furosemide  (LASIX ) 40 MG tablet Take 1 tablet (40 mg total) by mouth daily. (Patient taking differently: Take 20 mg by mouth daily.)   ipratropium-albuterol  (DUONEB) 0.5-2.5 (3) MG/3ML SOLN Take 3 mLs by nebulization every 4 (four) hours as needed.   losartan  (COZAAR ) 50 MG tablet Take 1 tablet (50 mg total) by mouth daily.   montelukast  (SINGULAIR ) 10 MG tablet Take 1 tablet (10 mg total) by mouth daily.   predniSONE  (DELTASONE ) 20 MG tablet  Take 2 tablets (40mg  total) by mouth once daily for 2 days, then 1 tablet (20mg  total) once daily for 2 days, then STOP.   rosuvastatin  (CRESTOR ) 5 MG tablet TAKE 1 TABLET BY MOUTH EVERY OTHER DAY   spironolactone  (ALDACTONE ) 25 MG tablet Take 0.5 tablets (12.5 mg total) by mouth daily.    Allergies:   Penicillins, Alendronate sodium, Cephalexin, Sulfa antibiotics, Sulfacetamide sodium, and Other   Social History   Socioeconomic History   Marital status: Widowed    Spouse name: Not on file   Number of children: Not on file   Years of education: Not on file   Highest education level: Not on file  Occupational History   Not on file  Tobacco Use   Smoking status: Never   Smokeless tobacco: Never   Tobacco comments:    occ wine  Vaping Use   Vaping status: Never Used  Substance and Sexual Activity   Alcohol use: Yes    Comment: occ   Drug use: No   Sexual activity: Not on file  Other Topics Concern   Not on file  Social History Narrative   Not on file   Social Drivers of Health   Financial Resource Strain: Not on file  Food Insecurity: No Food Insecurity (04/22/2024)   Hunger Vital Sign    Worried About Running Out of Food in the Last Year: Never true    Ran Out of Food in the Last Year: Never true   Transportation Needs: No Transportation Needs (04/22/2024)   PRAPARE - Administrator, Civil Service (Medical): No    Lack of Transportation (Non-Medical): No  Physical Activity: Not on file  Stress: Not on file  Social Connections: Unknown (04/22/2024)   Social Connection and Isolation Panel [NHANES]    Frequency of Communication with Friends and Family: More than three times a week    Frequency of Social Gatherings with Friends and Family: Twice a week    Attends Religious Services: More than 4 times per year    Active Member of [employer] or Organizations: No    Attends Engineer, structural: 1 to 4 times per year    Marital Status: Patient declined     Family History:  The patient's family history is not on file.   ROS:   Please see the history of present illness.    ROS All other systems reviewed and are negative.      No data to display             PHYSICAL EXAM:   VS:  Ht 4' 11.5" (1.511 m)   Wt 159 lb (72.1 kg)   BMI 31.58 kg/m    GEN: Well nourished, well developed in no acute distress HEENT: Normal NECK: No JVD; No carotid bruits LYMPHATICS: No lymphadenopathy CARDIAC:RRR, no murmurs, rubs, gallops RESPIRATORY:  scattered wheezes ABDOMEN: Soft, non-tender, non-distended MUSCULOSKELETAL:  No edema; No deformity  SKIN: Warm and dry NEUROLOGIC:  Alert and oriented x 3 PSYCHIATRIC:  Normal affect  Wt Readings from Last 3 Encounters:  05/16/24 159 lb (72.1 kg)  04/21/24 168 lb 14 oz (76.6 kg)  10/14/23 168 lb 12.8 oz (76.6 kg)      Studies/Labs Reviewed:    Recent Labs: 06/04/2023: TSH 2.840 04/21/2024: ALT 7; B Natriuretic Peptide 34.0; Hemoglobin 12.1; Platelets 260 04/25/2024: Magnesium  2.2 04/27/2024: BUN 20; Creatinine, Ser 1.09; Potassium 4.8; Sodium 137   Lipid Panel    Component Value Date/Time  CHOL 156 08/30/2023 1032   TRIG 50 08/30/2023 1032   HDL 73 08/30/2023 1032   CHOLHDL 2.1 08/30/2023 1032   CHOLHDL 2.5 07/06/2023  0039   VLDL 13 07/06/2023 0039   LDLCALC 72 08/30/2023 1032     ASSESSMENT:    1. SOB (shortness of breath)   2. Pulmonary hypertension, unspecified (HCC)   3. Primary hypertension   4. Edema of both ankles   5. Coronary artery disease involving native coronary artery of native heart without angina pectoris   6. Nonrheumatic mitral valve regurgitation   7. Hyperlipidemia LDL goal <70   8. Nonrheumatic aortic valve stenosis        PLAN:  In order of problems listed above:  Dyspnea on exertion/PHTN/Chronic diastolic CHF -Shortness of breath related to underlying lung disease with asthma intermittently requiring steroids -Coronary CTA with mild nonobstructive CAD -2D echo with normal LV function 06/10/2023 and grade 1 diastolic dysfunction with mild pulmonary hypertension PASP 44 mmHg likely related to her asthma -TSH normal on 06/04/2023 -Repeat echo 04/24/2024 with EF 60 to 65%, normal RV size and systolic function with moderate pulmonary hypertension PASP 57 mmHg - Pulmonary hypertension felt likely related to group 3 due to asthma - recently admitted with COPD exacerbation c/b acute on chronic diastolic CHF -appears euvolemic on exam today -continue Lasix  40mg  daily, Jardiance  10mg  daily and spiro 12.5 mg daily - Will look for other causes of pulmonary hypertension including VQ scan to rule out chronic thromboembolic disease, ESR/CRP - PFTs mildly abnormal from 10/10/2023 with mild obstructive airway disease and mildly reduced DLCO - Check split-night sleep study to rule out obstructive sleep apnea - Since pulmonary hypertension has increased from 44.5 mmHg to 57 mmHg in a year I think we need to repeat 2D echo in 6 months.  If this continues to trend upward will need right heart cath  2.  Hypertension -BP controlled -Continue amlodipine  10 mg daily, losartan  50 mg daily and spironolactone  12.5 mg daily with as needed refills -I have personally reviewed and interpreted outside  labs performed by patient's PCP which showed serum creatinine 1.09 and potassium 4.8 on 04/27/2024  3.  LE edema -Exacerbated recently by steroids for asthma -Continue Lasix  40 mg daily  4.  Coronary artery calcifications -Coronary CTA 2024 demonstrated  coronary calcium  score 452 which was 73rd percentile for age, gender and sex matched controls as well as mild nonobstructive CAD with <25% stenosis of the proximal to mid LAD, 25 to 49% mixed plaque in the proximal RCA and normal left main and left circumflex. -Denies any anginal symptoms -Continue drug management with aspirin  81 mg daily, Crestor  5 mg daily with as needed refills  5.  Mitral regurgitation -Mild to mod by TEE 07/2023 and 2D echo 04/24/2024 -Repeat echo in 1 year    6.  Hyperlipidemia -LDL goal less than 70 -I have personally reviewed and interpreted outside labs performed by patient's PCP which showed LDL 72 and HDL 73 and ALT 6 on 08/30/2023-I have instructed her to try to increase her Crestor  to 5 mg every other day -Continue prescription drug management with Crestor  5 mg daily with as needed refills  7.  Aortic stenosis - TEE 08/10/2023 showed moderate calcification of the aortic valve but felt to be aortic valve sclerosis and no AAS - 2D echo 04/24/2024 showed a mean aortic valve gradient of 16 mmHg with V-max 2.65 m/s in the setting of low SVI of 29 and a DVI of  0.4 more consistent with low-flow low gradient moderate aortic stenosis - Repeat 2D echo in 1 year  Followup: 6 months  Medication Adjustments/Labs and Tests Ordered: Current medicines are reviewed at length with the patient today.  Concerns regarding medicines are outlined above.  Medication changes, Labs and Tests ordered today are listed in the Patient Instructions below.  There are no Patient Instructions on file for this visit.   Signed, Gaylyn Keas, MD  05/16/2024 5:20 PM    [facility] [address] [place], [place], [place]   [address] Phone: [phone]; Fax: [phone]

## 2024-05-16 NOTE — Patient Instructions (Signed)
 Medication Instructions:  Your physician recommends that you continue on your current medications as directed. Please refer to the Current Medication list given to you today.  *If you need a refill on your cardiac medications before your next appointment, please call your pharmacy*  Lab Work: Please complete a Sedimentation rate and a CRP at any [facility] in the next week. You do not need to be fasting for these labs.  If you have labs (blood work) drawn today and your tests are completely normal, you will receive your results only by: MyChart Message (if you have MyChart) OR A paper copy in the mail If you have any lab test that is abnormal or we need to change your treatment, we will call you to review the results.  Testing/Procedures: Your physician has recommended that you have a split night sleep study. This test records several body functions during sleep, including: brain activity, eye movement, oxygen and carbon dioxide blood levels, heart rate and rhythm, breathing rate and rhythm, the flow of air through your mouth and nose, snoring, body muscle movements, and chest and belly movement.   Your physician has requested that you have an echocardiogram. Echocardiography is a painless test that uses sound waves to create images of your heart. It provides your doctor with information about the size and shape of your heart and how well your heart's chambers and valves are working. This procedure takes approximately one hour. There are no restrictions for this procedure. Please do NOT wear cologne, perfume, aftershave, or lotions (deodorant is allowed). Please arrive 15 minutes prior to your appointment time.  Please note: We ask at that you not bring children with you during ultrasound (echo/ vascular) testing. Due to room size and safety concerns, children are not allowed in the ultrasound rooms during exams. Our front office staff cannot provide observation of children in our lobby area while  testing is being conducted. An adult accompanying a patient to their appointment will only be allowed in the ultrasound room at the discretion of the ultrasound technician under special circumstances. We apologize for any inconvenience.  Your physician has ordered a VQ scan. This is a scan that can help detect any blood clots in the lungs. Someone will call you to schedule this test.  Follow-Up: At [facility], you and your health needs are our priority.  As part of our continuing mission to provide you with exceptional heart care, our providers are all part of one team.  This team includes your primary Cardiologist (physician) and Advanced Practice Providers or APPs (Physician Assistants and Nurse Practitioners) who all work together to provide you with the care you need, when you need it.  Your next appointment:   6 month(s)  Provider:   Gaylyn Keas, MD    Please ensure you have completed your echocardiogram BEFORE you see Dr. Micael Adas for follow-up.

## 2024-05-16 NOTE — Addendum Note (Signed)
 Addended by: Cherylyn Cos on: 05/16/2024 05:33 PM   Modules accepted: Orders

## 2024-05-19 ENCOUNTER — Other Ambulatory Visit (HOSPITAL_COMMUNITY): Payer: Self-pay

## 2024-05-19 ENCOUNTER — Other Ambulatory Visit: Payer: Self-pay

## 2024-05-19 ENCOUNTER — Other Ambulatory Visit: Payer: Self-pay | Admitting: Cardiology

## 2024-05-19 DIAGNOSIS — J45901 Unspecified asthma with (acute) exacerbation: Secondary | ICD-10-CM | POA: Diagnosis not present

## 2024-05-19 DIAGNOSIS — E7800 Pure hypercholesterolemia, unspecified: Secondary | ICD-10-CM | POA: Diagnosis not present

## 2024-05-19 DIAGNOSIS — I70213 Atherosclerosis of native arteries of extremities with intermittent claudication, bilateral legs: Secondary | ICD-10-CM | POA: Diagnosis not present

## 2024-05-19 DIAGNOSIS — I5033 Acute on chronic diastolic (congestive) heart failure: Secondary | ICD-10-CM | POA: Diagnosis not present

## 2024-05-19 DIAGNOSIS — I080 Rheumatic disorders of both mitral and aortic valves: Secondary | ICD-10-CM | POA: Diagnosis not present

## 2024-05-19 DIAGNOSIS — Z7951 Long term (current) use of inhaled steroids: Secondary | ICD-10-CM | POA: Diagnosis not present

## 2024-05-19 DIAGNOSIS — N1831 Chronic kidney disease, stage 3a: Secondary | ICD-10-CM | POA: Diagnosis not present

## 2024-05-19 DIAGNOSIS — I2510 Atherosclerotic heart disease of native coronary artery without angina pectoris: Secondary | ICD-10-CM | POA: Diagnosis not present

## 2024-05-19 DIAGNOSIS — J441 Chronic obstructive pulmonary disease with (acute) exacerbation: Secondary | ICD-10-CM | POA: Diagnosis not present

## 2024-05-19 DIAGNOSIS — I130 Hypertensive heart and chronic kidney disease with heart failure and stage 1 through stage 4 chronic kidney disease, or unspecified chronic kidney disease: Secondary | ICD-10-CM | POA: Diagnosis not present

## 2024-05-19 DIAGNOSIS — M810 Age-related osteoporosis without current pathological fracture: Secondary | ICD-10-CM | POA: Diagnosis not present

## 2024-05-19 DIAGNOSIS — Z7984 Long term (current) use of oral hypoglycemic drugs: Secondary | ICD-10-CM | POA: Diagnosis not present

## 2024-05-19 DIAGNOSIS — H35039 Hypertensive retinopathy, unspecified eye: Secondary | ICD-10-CM | POA: Diagnosis not present

## 2024-05-19 DIAGNOSIS — E876 Hypokalemia: Secondary | ICD-10-CM | POA: Diagnosis not present

## 2024-05-19 DIAGNOSIS — K219 Gastro-esophageal reflux disease without esophagitis: Secondary | ICD-10-CM | POA: Diagnosis not present

## 2024-05-19 DIAGNOSIS — I272 Pulmonary hypertension, unspecified: Secondary | ICD-10-CM | POA: Diagnosis not present

## 2024-05-19 DIAGNOSIS — R7303 Prediabetes: Secondary | ICD-10-CM | POA: Diagnosis not present

## 2024-05-19 DIAGNOSIS — Z7982 Long term (current) use of aspirin: Secondary | ICD-10-CM | POA: Diagnosis not present

## 2024-05-19 DIAGNOSIS — N179 Acute kidney failure, unspecified: Secondary | ICD-10-CM | POA: Diagnosis not present

## 2024-05-19 MED ORDER — FUROSEMIDE 40 MG PO TABS
40.0000 mg | ORAL_TABLET | Freq: Every day | ORAL | 3 refills | Status: DC
  Filled 2024-05-19: qty 90, 90d supply, fill #0

## 2024-05-20 ENCOUNTER — Other Ambulatory Visit: Payer: Self-pay | Admitting: Allergy & Immunology

## 2024-05-23 ENCOUNTER — Encounter: Payer: Self-pay | Admitting: Allergy & Immunology

## 2024-05-23 ENCOUNTER — Ambulatory Visit: Admitting: Allergy & Immunology

## 2024-05-23 ENCOUNTER — Other Ambulatory Visit: Payer: Self-pay

## 2024-05-23 VITALS — BP 136/82 | HR 94 | Temp 98.2°F | Resp 18 | Ht 59.0 in | Wt 166.0 lb

## 2024-05-23 DIAGNOSIS — R7303 Prediabetes: Secondary | ICD-10-CM | POA: Diagnosis not present

## 2024-05-23 DIAGNOSIS — I2510 Atherosclerotic heart disease of native coronary artery without angina pectoris: Secondary | ICD-10-CM | POA: Diagnosis not present

## 2024-05-23 DIAGNOSIS — I70213 Atherosclerosis of native arteries of extremities with intermittent claudication, bilateral legs: Secondary | ICD-10-CM | POA: Diagnosis not present

## 2024-05-23 DIAGNOSIS — Z7982 Long term (current) use of aspirin: Secondary | ICD-10-CM | POA: Diagnosis not present

## 2024-05-23 DIAGNOSIS — J330 Polyp of nasal cavity: Secondary | ICD-10-CM

## 2024-05-23 DIAGNOSIS — Z7984 Long term (current) use of oral hypoglycemic drugs: Secondary | ICD-10-CM | POA: Diagnosis not present

## 2024-05-23 DIAGNOSIS — E7800 Pure hypercholesterolemia, unspecified: Secondary | ICD-10-CM | POA: Diagnosis not present

## 2024-05-23 DIAGNOSIS — I130 Hypertensive heart and chronic kidney disease with heart failure and stage 1 through stage 4 chronic kidney disease, or unspecified chronic kidney disease: Secondary | ICD-10-CM | POA: Diagnosis not present

## 2024-05-23 DIAGNOSIS — R768 Other specified abnormal immunological findings in serum: Secondary | ICD-10-CM

## 2024-05-23 DIAGNOSIS — Z7951 Long term (current) use of inhaled steroids: Secondary | ICD-10-CM | POA: Diagnosis not present

## 2024-05-23 DIAGNOSIS — I080 Rheumatic disorders of both mitral and aortic valves: Secondary | ICD-10-CM | POA: Diagnosis not present

## 2024-05-23 DIAGNOSIS — J4489 Other specified chronic obstructive pulmonary disease: Secondary | ICD-10-CM

## 2024-05-23 DIAGNOSIS — N179 Acute kidney failure, unspecified: Secondary | ICD-10-CM | POA: Diagnosis not present

## 2024-05-23 DIAGNOSIS — J302 Other seasonal allergic rhinitis: Secondary | ICD-10-CM | POA: Diagnosis not present

## 2024-05-23 DIAGNOSIS — E876 Hypokalemia: Secondary | ICD-10-CM | POA: Diagnosis not present

## 2024-05-23 DIAGNOSIS — N1831 Chronic kidney disease, stage 3a: Secondary | ICD-10-CM | POA: Diagnosis not present

## 2024-05-23 DIAGNOSIS — J3089 Other allergic rhinitis: Secondary | ICD-10-CM

## 2024-05-23 DIAGNOSIS — K219 Gastro-esophageal reflux disease without esophagitis: Secondary | ICD-10-CM | POA: Diagnosis not present

## 2024-05-23 DIAGNOSIS — M810 Age-related osteoporosis without current pathological fracture: Secondary | ICD-10-CM | POA: Diagnosis not present

## 2024-05-23 DIAGNOSIS — I5033 Acute on chronic diastolic (congestive) heart failure: Secondary | ICD-10-CM | POA: Diagnosis not present

## 2024-05-23 DIAGNOSIS — J441 Chronic obstructive pulmonary disease with (acute) exacerbation: Secondary | ICD-10-CM | POA: Diagnosis not present

## 2024-05-23 DIAGNOSIS — J45901 Unspecified asthma with (acute) exacerbation: Secondary | ICD-10-CM | POA: Diagnosis not present

## 2024-05-23 DIAGNOSIS — I272 Pulmonary hypertension, unspecified: Secondary | ICD-10-CM | POA: Diagnosis not present

## 2024-05-23 DIAGNOSIS — H35039 Hypertensive retinopathy, unspecified eye: Secondary | ICD-10-CM | POA: Diagnosis not present

## 2024-05-23 MED ORDER — FORMOTEROL FUMARATE 20 MCG/2ML IN NEBU: 20.0000 ug | INHALATION_SOLUTION | Freq: Two times a day (BID) | RESPIRATORY_TRACT | 1 refills | Status: DC

## 2024-05-23 MED ORDER — BUDESONIDE 0.5 MG/2ML IN SUSP: 0.5000 mg | Freq: Two times a day (BID) | RESPIRATORY_TRACT | 1 refills | Status: DC

## 2024-05-23 NOTE — Patient Instructions (Addendum)
 1. Moderate persistent asthma, uncomplicated - Lung testing looked a bit better today. - We are going to start all nebulized medication - STOP the Trelegy.  - Daily controller medication(s): Pulmicort  (budesonide ) 0.5 mg + Perforomist  (formoterol ) MIXED TOGETHER TWICE DAILY VIA NEBULIZER + Singulair  (montelukast ) 10mg  daily - Prior to physical activity: albuterol  2 puffs 10-15 minutes before physical activity. - Rescue medications: albuterol  4 puffs every 4-6 hours as needed and albuterol  nebulizer one vial every 4-6 hours as needed - Asthma control goals:  * Full participation in all desired activities (may need albuterol  before activity) * Albuterol  use two time or less a week on average (not counting use with activity) * Cough interfering with sleep two time or less a month * Oral steroids no more than once a year * No hospitalizations - I will talk to Dr. Waylan Haggard about future plans.   2. 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 - Continue with Mucinex  as needed.  3. Low blood pressure at home - I will talk to Dr. Marily Shows about changing blood pressure medications.   4. Return in about 2 weeks (around 06/06/2024). Get the chest X-ray BEFORE our next appointment! The order is in there.     Please inform us  of any Emergency Department visits, hospitalizations, or changes in symptoms. Call us  before going to the ED for breathing or allergy symptoms since we might be able to fit you in for a sick visit. Feel free to contact us  anytime with any questions, problems, or concerns.  It was a pleasure to see you again today!  Websites that have reliable patient information: 1. American Academy of Asthma, Allergy, and Immunology: www.aaaai.org 2. Food Allergy Research and Education (FARE): foodallergy.org 3. Mothers of Asthmatics: http://www.asthmacommunitynetwork.org 4. American College of Allergy, Asthma, and Immunology:  www.acaai.org      "Like" us  on Facebook and Instagram for our latest updates!      A healthy democracy works best when [employer] participate! Make sure you are registered to vote! If you have moved or changed any of your contact information, you will need to get this updated before voting! Scan the QR codes below to learn more!

## 2024-05-23 NOTE — Progress Notes (Signed)
 FOLLOW UP  Date of Service/Encounter:  05/23/24   Assessment:   Moderate persistent asthma, uncomplicated - with three rounds of prednisone  in less than 6 months   Strongly consider Dupixent (have discussed with patient and she is worried about the side effects), seems open to [employer] (provided handout today)   Chronic rhinitis (grasses, weeds, ragweed, trees, molds, dust mites, roach, cat, dog) - s/p years of allergen immunotherapy   Elevated IgE - with normal SPEP and UPEP    Polyp of left nasal cavity - much better with the regular use of fluticasone  nasal spray  Plan/Recommendations:   1. Moderate persistent asthma, uncomplicated - Lung testing looked a bit better today. - We are going to start all nebulized medication - STOP the Trelegy.  - Daily controller medication(s): Pulmicort  (budesonide ) 0.5 mg + Perforomist  (formoterol ) MIXED TOGETHER TWICE DAILY VIA NEBULIZER + Singulair  (montelukast ) 10mg  daily - Prior to physical activity: albuterol  2 puffs 10-15 minutes before physical activity. - Rescue medications: albuterol  4 puffs every 4-6 hours as needed and albuterol  nebulizer one vial every 4-6 hours as needed - Asthma control goals:  * Full participation in all desired activities (may need albuterol  before activity) * Albuterol  use two time or less a week on average (not counting use with activity) * Cough interfering with sleep two time or less a month * Oral steroids no more than once a year * No hospitalizations - I will talk to Dr. Waylan Haggard about future plans.   2. 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 - Continue with Mucinex  as needed.  3. Low blood pressure at home - I will talk to Dr. Marily Shows about changing blood pressure medications.   4. Return in about 2 weeks (around 06/06/2024). Get the chest X-ray BEFORE our next appointment! The order is in there.    Subjective:   Mary Chase is a 89 y.o.  female presenting today for follow up of  Chief Complaint  Patient presents with   Establish Care   Asthma    No issue with asthma has been doing well with medication.    Mary Chase has a history of the following: Patient Active Problem List   Diagnosis Date Noted   Acute asthma exacerbation 04/22/2024   Obesity, class 1 04/22/2024   Seasonal and perennial allergic rhinitis 01/06/2024   Polyp of nasal cavity 01/06/2024   Asthma-COPD overlap syndrome (HCC) 01/06/2024   Moderate persistent asthma with acute exacerbation 07/05/2023   CKD stage 3a, GFR 45-59 ml/min (HCC) 07/05/2023   CAD (coronary artery disease), native coronary artery 06/30/2023   Edema of both ankles 06/22/2023   Ascending aorta dilatation (HCC) 06/04/2023   Pulmonary hypertension (HCC) 06/04/2023   Mitral regurgitation 06/04/2023   Tricuspid regurgitation 06/04/2023   Acute on chronic diastolic CHF (congestive heart failure) (HCC) 06/04/2023   COPD exacerbation (HCC) 05/04/2023   Asthma, chronic, unspecified asthma severity, with acute exacerbation 05/03/2023   HTN (hypertension) 05/03/2023   Acute respiratory failure with hypoxia (HCC) 05/03/2023   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   Osteoarthritis of right hip 09/11/2020   Obstructive lung disease (HCC) 08/31/2015   H/O allergic rhinitis 08/31/2015    History obtained from: chart review and patient and her son.   Discussed the use of AI scribe software for clinical note transcription with the patient and/or guardian, who gave verbal consent to proceed.  Mary Chase is a 89 y.o. female presenting for a  follow up visit.  She was last seen in April 2025.  At this time, like testing looked a bit better.  We gave her another sample of Trelegy.  We talked about adding a biologic for long-term control to keep her off prednisone .  We did get another chest x-ray to look at her interstitial lung disease.  We continue with Singulair  10 mg daily and albuterol  as  needed.  For her rhinitis, we continue with Flonase  as well as Mucinex .  Since last visit, she has not done well. She has been having some more shortness of breath episodes and generally feels bad. She just does not think that she is able to do all of the daily activities of daily living that she was doing in the past.   She is currently taking aspirin  twice a day and has been prescribed multiple medications for her heart and blood pressure, including amlodipine , spironolactone , and furosemide . Her blood pressure has been very low at home, often around 104-107, and she feels weak daily. She is also taking montelukast  for asthma.  She feels that her blood pressure medications might be contributing to her current combination of symptoms, including her fatigue. She is on Lasix  as well as spironolactone  and losartan  as well as amlodipine .   Asthma/Respiratory Symptom History: She was previously on Trelegy, which she stopped after running out of samples. She has sent in paperwork to obtain more but has not received a response. She is uncertain if Trelegy was beneficial as she is on multiple medications and cannot discern which are effective. She has been experiencing worsening shortness of breath, which has not improved despite medication. She becomes breathless after walking short distances, such as from her bed to the bathroom. She was recently hospitalized, with her last admission on Apr 21, 2024, where she underwent a chest angiogram and was treated with steroids and antibiotics, which provided temporary relief. At the hospital, she received two medications in her breathing treatments, whereas at home she uses only albuterol . She is unsure if the hospital treatments included a steroid.  Chest CT (May 3rd) IMPRESSION: 1. No evidence of arterial embolus or aortic dissection. 2. Cardiomegaly with small pericardial effusion. 3. Aortic and coronary artery atherosclerosis. 4. Diffuse bronchial thickening with  scattered bilateral posterior basal subsegmental bronchial plugging. 5. Ill-defined airspace disease in the anterior segment of the right upper lobe, probably due to pneumonia. 6. Increased consolidation in the dorsal lingular base which could be atelectasis or pneumonia. Short interval follow-up chest CT recommended to ensure clearing. 7. Constipation and diverticulosis. 8. Cholelithiasis versus sludge balls in the gallbladder with mild gallbladder dilatation but no wall thickening or pericholecystic edema. 9. Fibroid uterus. 10. Osteopenia and degenerative change. Severe acquired spinal canal stenosis at L2-3, L3-4, L4-5 with multilevel acquired lumbar foraminal stenosis.  She has an appointment with Dr. Irving Mantle on June 9th. Hopefully she will know more at that point in time.   She is concerned about the number of medications she is taking and their effectiveness, stating 'I don't know which one of them is helping me the most.' Her son has observed her weakness and fatigue, which she attributes to her medication regimen. She wants to go through her medications today.    Allergic Rhinitis Symptom History: She remains on the Flonase  and the Mucinex . This seems to be working well with her rhinitis symptoms. She has not been on antibiotics at all for her symptoms.   Otherwise, there have been no changes to her past  medical history, surgical history, family history, or social history.    Review of systems otherwise negative other than that mentioned in the HPI.    Objective:   Blood pressure 136/82, pulse 94, temperature 98.2 F (36.8 C), temperature source Temporal, resp. rate 18, height 4\' 11"  (1.499 m), weight 166 lb (75.3 kg), SpO2 99%. Body mass index is 33.53 kg/m.    Physical Exam Vitals reviewed.  Constitutional:      Appearance: Normal appearance. She is well-developed.     Comments: Very pleasant female. Talkative. Boisterous.   HENT:     Head: Normocephalic and atraumatic.      Right Ear: Tympanic membrane, ear canal and external ear normal. No drainage, swelling or tenderness. Tympanic membrane is not injected, scarred, erythematous, retracted or bulging.     Left Ear: Tympanic membrane, ear canal and external ear normal. No drainage, swelling or tenderness. Tympanic membrane is not injected, scarred, erythematous, retracted or bulging.     Nose: No nasal deformity, septal deviation, mucosal edema or rhinorrhea.     Right Turbinates: Enlarged, swollen and pale.     Left Turbinates: Enlarged, swollen and pale.     Right Sinus: No maxillary sinus tenderness or frontal sinus tenderness.     Left Sinus: No maxillary sinus tenderness or frontal sinus tenderness.     Comments: Chronic rhinitis.     Mouth/Throat:     Lips: Pink.     Mouth: Mucous membranes are moist. Mucous membranes are not pale and not dry.     Pharynx: Uvula midline.     Comments: Cobblestoning in the posterior oropharynx.  Eyes:     General: Allergic shiner present.        Right eye: No discharge.        Left eye: No discharge.     Conjunctiva/sclera: Conjunctivae normal.     Right eye: Right conjunctiva is not injected. No chemosis.    Left eye: Left conjunctiva is not injected. No chemosis.    Pupils: Pupils are equal, round, and reactive to light.  Cardiovascular:     Rate and Rhythm: Normal rate and regular rhythm.     Heart sounds: Normal heart sounds.  Pulmonary:     Effort: Pulmonary effort is normal. No tachypnea, accessory muscle usage or respiratory distress.     Breath sounds: Decreased air movement present. No wheezing, rhonchi or rales.     Comments: Decreased air movement at the bases. No crackles noted. Chest:     Chest wall: No tenderness.  Musculoskeletal:     Cervical back: No rigidity.  Lymphadenopathy:     Head:     Right side of head: No submandibular, tonsillar or occipital adenopathy.     Left side of head: No submandibular, tonsillar or occipital adenopathy.      Cervical: No cervical adenopathy.  Skin:    Coloration: Skin is not pale.     Findings: No abrasion, erythema, petechiae or rash. Rash is not papular, urticarial or vesicular.  Neurological:     Mental Status: She is alert.  Psychiatric:        Behavior: Behavior is cooperative.      Diagnostic studies:    Spirometry: results abnormal (FEV1: 0.52/40%, FVC: 1.20/71%, FEV1/FVC: 43%).    Spirometry consistent with moderate obstructive disease.   Allergy Studies: none      Drexel Gentles, MD  [facility] 

## 2024-05-24 ENCOUNTER — Other Ambulatory Visit: Payer: Self-pay

## 2024-05-24 ENCOUNTER — Emergency Department (HOSPITAL_COMMUNITY)
Admission: EM | Admit: 2024-05-24 | Discharge: 2024-05-24 | Disposition: A | Discharge status: Home / Self Care | Attending: Emergency Medicine | Admitting: Emergency Medicine

## 2024-05-24 ENCOUNTER — Encounter (HOSPITAL_COMMUNITY): Payer: Self-pay

## 2024-05-24 DIAGNOSIS — R069 Unspecified abnormalities of breathing: Secondary | ICD-10-CM | POA: Diagnosis not present

## 2024-05-24 DIAGNOSIS — R000 Tachycardia, unspecified: Secondary | ICD-10-CM | POA: Insufficient documentation

## 2024-05-24 DIAGNOSIS — R0602 Shortness of breath: Secondary | ICD-10-CM | POA: Diagnosis present

## 2024-05-24 DIAGNOSIS — J4521 Mild intermittent asthma with (acute) exacerbation: Secondary | ICD-10-CM

## 2024-05-24 DIAGNOSIS — Z7982 Long term (current) use of aspirin: Secondary | ICD-10-CM | POA: Diagnosis not present

## 2024-05-24 DIAGNOSIS — J449 Chronic obstructive pulmonary disease, unspecified: Secondary | ICD-10-CM | POA: Diagnosis not present

## 2024-05-24 DIAGNOSIS — J45901 Unspecified asthma with (acute) exacerbation: Secondary | ICD-10-CM | POA: Insufficient documentation

## 2024-05-24 LAB — I-STAT VENOUS BLOOD GAS, ED
Acid-Base Excess: 1 mmol/L (ref 0.0–2.0)
Bicarbonate: 28.4 mmol/L — ABNORMAL HIGH (ref 20.0–28.0)
Calcium, Ion: 1.13 mmol/L — ABNORMAL LOW (ref 1.15–1.40)
HCT: 33 % — ABNORMAL LOW (ref 36.0–46.0)
Hemoglobin: 11.2 g/dL — ABNORMAL LOW (ref 12.0–15.0)
O2 Saturation: 97 %
Potassium: 3.8 mmol/L (ref 3.5–5.1)
Sodium: 137 mmol/L (ref 135–145)
TCO2: 30 mmol/L (ref 22–32)
pCO2, Ven: 55.4 mmHg (ref 44–60)
pH, Ven: 7.317 (ref 7.25–7.43)
pO2, Ven: 104 mmHg — ABNORMAL HIGH (ref 32–45)

## 2024-05-24 MED ORDER — FAMOTIDINE IN NACL 20-0.9 MG/50ML-% IV SOLN
20.0000 mg | Freq: Once | INTRAVENOUS | Status: AC
  Administered 2024-05-24: 20 mg via INTRAVENOUS
  Filled 2024-05-24: qty 50

## 2024-05-24 MED ORDER — PREDNISONE 10 MG PO TABS: 40.0000 mg | ORAL_TABLET | Freq: Every day | ORAL | 0 refills | Status: AC

## 2024-05-24 MED ORDER — EPINEPHRINE 0.3 MG/0.3ML IJ SOAJ
0.3000 mg | Freq: Once | INTRAMUSCULAR | Status: AC
  Administered 2024-05-24: 0.3 mg via INTRAMUSCULAR
  Filled 2024-05-24: qty 0.3

## 2024-05-24 MED ORDER — IPRATROPIUM-ALBUTEROL 0.5-2.5 (3) MG/3ML IN SOLN
3.0000 mL | Freq: Once | RESPIRATORY_TRACT | Status: AC
  Administered 2024-05-24: 3 mL via RESPIRATORY_TRACT
  Filled 2024-05-24: qty 3

## 2024-05-24 MED ORDER — DIPHENHYDRAMINE HCL 50 MG/ML IJ SOLN
12.5000 mg | Freq: Once | INTRAMUSCULAR | Status: AC
  Administered 2024-05-24: 12.5 mg via INTRAVENOUS
  Filled 2024-05-24: qty 1

## 2024-05-24 NOTE — ED Triage Notes (Signed)
 Pt bib [facility] coming from home, stating she was around freshly cut grass, starting asthma attack around 6-7pm. Pt reports using at home nebulizer. EMS reports diminished all over with wheezing and increased work of breathing upon arrival. EMS states pt room air SpO2 @83 %. Pt showing labored breathing during triage process. GCS 15.   EMS vitals/pre-tx: Duoneb x2 125 solumedrol 2g mag given  140/76  120 HR 100% duoneb  82% room air 18 left forearm

## 2024-05-24 NOTE — ED Notes (Signed)
 ED Provider at bedside.

## 2024-05-24 NOTE — Discharge Instructions (Signed)
 You have been seen here today for your asthma exacerbation.  You have been prescribed prednisone  to help with your lung inflammation at home.  Please take this daily for the next 5 days.  Take this medication in the morning with breakfast, as taking it at night may make it hard to sleep.   Please use your breathing treatments at home as prescribed.  Please follow-up with your allergist within the next 3 days for recheck of symptoms.   Return to the ER for any difficulty breathing, shortness of breath, tongue or lip swelling, any other new or concerning symptoms

## 2024-05-24 NOTE — ED Provider Notes (Signed)
 [place] EMERGENCY DEPARTMENT AT [facility] Provider Note   CSN: [id number] Arrival date & time: 05/24/24  0157     History  Chief Complaint  Patient presents with   Respiratory Distress    Mary Chase is a 89 y.o. female with pertinent past medical history of COPD, asthma, presents with concern for difficulty breathing after being around cut grass earlier today.  States she started to notice her asthma flareup and tried taking her home inhaler which did not help relieve symptoms.  EMS was called and they gave patient DuoNeb treatments as well as Solu-Medrol  and magnesium .  Patient still reporting shortness of breath upon arrival.    HPI     Home Medications Prior to Admission medications   Medication Sig Start Date End Date Taking? Authorizing Provider  acetaminophen  (TYLENOL ) 500 MG tablet Take 500 mg by mouth in the morning and at bedtime.    [provider]  albuterol  (PROVENTIL ) (2.5 MG/3ML) 0.083% nebulizer solution TAKE 3 ML (2.5 MG TOTAL) BY NEBULIZATION EVERY 4 HOURS AS NEEDED FOR WHEEZING OR SHORTNESS OF BREATH 02/14/24   Rochester Chuck, MD  amLODipine  (NORVASC ) 10 MG tablet Take 10 mg by mouth daily. 11/23/22   [provider]  aspirin  EC 81 MG tablet Take 1 tablet (81 mg total) by mouth 2 (two) times daily. 09/16/20   Sheron Dixons, PA-C  budesonide  (PULMICORT ) 0.5 MG/2ML nebulizer solution Take 2 mLs (0.5 mg total) by nebulization in the morning and at bedtime. 05/23/24 06/22/24  Rochester Chuck, MD  Cholecalciferol (VITAMIN D3) 25 MCG (1000 UT) CAPS Take 1,000 Units by mouth daily in the afternoon.    [provider]  Cyanocobalamin (B-12) 5000 MCG CAPS Take 5,000 mcg by mouth daily.    [provider]  empagliflozin  (JARDIANCE ) 10 MG TABS tablet Take 1 tablet (10 mg total) by mouth daily. 04/27/24   Deforest Fast, MD  fluticasone  (FLONASE ) 50 MCG/ACT nasal spray PLACE 1 SPRAY INTO BOTH NOSTRILS DAILY AS  NEEDED FOR ALLERGIES OR RHINITIS. 01/03/24   Rochester Chuck, MD  Fluticasone -Umeclidin-Vilant (TRELEGY ELLIPTA ) 200-62.5-25 MCG/ACT AEPB Inhale 1 puff into the lungs daily. 03/21/24   Rochester Chuck, MD  formoterol  (PERFOROMIST ) 20 MCG/2ML nebulizer solution Take 2 mLs (20 mcg total) by nebulization 2 (two) times daily. 05/23/24 06/22/24  Rochester Chuck, MD  furosemide  (LASIX ) 20 MG tablet Take 20 mg by mouth daily as needed for edema or fluid.    [provider]  losartan  (COZAAR ) 50 MG tablet Take 1 tablet (50 mg total) by mouth daily. 05/11/23   Elgergawy, Ardia Kraft, MD  montelukast  (SINGULAIR ) 10 MG tablet Take 1 tablet (10 mg total) by mouth daily. 09/10/22   Rochester Chuck, MD  predniSONE  (DELTASONE ) 10 MG tablet Take 4 tablets (40 mg total) by mouth daily for 5 days. 05/24/24 05/29/24 Yes Rexie Catena, PA-C  rosuvastatin  (CRESTOR ) 5 MG tablet TAKE 1 TABLET BY MOUTH EVERY OTHER DAY 04/04/24   Jacqueline Matsu, MD  spironolactone  (ALDACTONE ) 25 MG tablet Take 0.5 tablets (12.5 mg total) by mouth daily. 04/27/24   Deforest Fast, MD      Allergies    Penicillins, Alendronate sodium, Cephalexin, Sulfa antibiotics, Sulfacetamide sodium, and Other    Review of Systems   Review of Systems  Respiratory:  Positive for shortness of breath.     Physical Exam Updated Vital Signs BP 100/62   Pulse (!) 103   Temp 97.7 F (  36.5 C) (Axillary)   Resp 16   Ht 4\' 11"  (1.499 m)   Wt 74.4 kg   SpO2 93%   BMI 33.12 kg/m  Physical Exam Vitals and nursing note reviewed.  Constitutional:      Appearance: Normal appearance.  HENT:     Head: Atraumatic.     Comments: No edema of the lips, tongue, or posterior oropharynx.  Patient swallowing secretions without difficulty Cardiovascular:     Rate and Rhythm: Regular rhythm. Tachycardia present.  Pulmonary:     Effort: Pulmonary effort is normal.     Comments: Patient with diminished lung sounds particularly in the right  lower lobe.  Patient with expiratory wheezing in the lobes bilaterally.  No stridor  Patient able to talk in full sentences without difficulty to provide history. Neurological:     General: No focal deficit present.     Mental Status: She is alert.  Psychiatric:        Mood and Affect: Mood normal.        Behavior: Behavior normal.     ED Results / Procedures / Treatments   Labs (all labs ordered are listed, but only abnormal results are displayed) Labs Reviewed  I-STAT VENOUS BLOOD GAS, ED - Abnormal; Notable for the following components:      Result Value   pO2, Ven 104 (*)    Bicarbonate 28.4 (*)    Calcium , Ion 1.13 (*)    HCT 33.0 (*)    Hemoglobin 11.2 (*)    All other components within normal limits    EKG None  Radiology No results found.  Procedures Procedures    Medications Ordered in ED Medications  EPINEPHrine  (EPI-PEN) injection 0.3 mg (0.3 mg Intramuscular Given 05/24/24 0213)  famotidine (PEPCID) IVPB 20 mg premix (0 mg Intravenous Stopped 05/24/24 0247)  diphenhydrAMINE  (BENADRYL ) injection 12.5 mg (12.5 mg Intravenous Given 05/24/24 0216)  ipratropium-albuterol  (DUONEB) 0.5-2.5 (3) MG/3ML nebulizer solution 3 mL (3 mLs Nebulization Given 05/24/24 0406)    ED Course/ Medical Decision Making/ A&P                                 Medical Decision Making Risk Prescription drug management.     Differential diagnosis includes but is not limited to anaphylaxis, asthma exacerbation, COPD exacerbation  ED Course:  Upon initial evaluation, patient is on breathing treatment.  Able to talk in full sentences on room air, but reporting shortness of breath.  She does have expiratory wheezing in the lungs bilaterally with decreased lung sounds in the right lower lobe.  She did receive Solu-Medrol  and magnesium  by EMS.  Given continued symptoms, will give additional Pepcid, Benadryl , Epinephrine  for symptoms. Patient placed on BiPAP by respiratory.   Labs  Ordered: I Ordered, and personally interpreted labs.  The pertinent results include:   VBG without any acidosis   Cardiac Monitoring: / EKG: The patient was maintained on a cardiac monitor.  I personally viewed and interpreted the cardiac monitored which showed an underlying rhythm of: sinus tachycardia  Medications Given: 125mg  Solumedrol and 2g magnesium  prior to arrival  Upon re-evaluation after epi, famotidine, Benadryl , and patient taken off BiPAP, she states she is feeling much better.  Lungs with improved airflow, but still with some wheezing bilaterally.  Discussed that we can try an additional DuoNeb treatment, she is in agreement. Upon reevaluation after DuoNeb, patient states she is feeling much better. Lungs  with better air flow although still with some wheezing.  She is stating 95% on room air, talking in full sentences without difficulty.  She has slight tachycardia to 103, but feel this is secondary to the steroids and DuoNeb treatments she has received today.  Low concern for PE as symptoms started after being around cut grass, and patient has known history of asthma and COPD exacerbations, and had significant wheezing on exam today.  She already has budesonide  and formoterol  nebulizer she uses at home for maintenance of her COPD and asthma.  Hemoglobin A1c at 5.7 at last check.  Discussed that we can add on oral prednisone  for at home for this acute asthma/COPD exacerbation.  She is in agreement with this plan.  Stable and appropriate for discharge home    Impression: Asthma exacerbation  Disposition:  The patient was discharged home with instructions to take course of prednisone  as prescribed.  Follow-up with her allergist within the next 3 days for recheck of symptoms. Return precautions given.    Record Review: External records from outside source obtained and reviewed including allergy doctor visit where she was started on budesonide /formoterol      This chart was  dictated using voice recognition software, Dragon. Despite the best efforts of this provider to proofread and correct errors, errors may still occur which can change documentation meaning.          Final Clinical Impression(s) / ED Diagnoses Final diagnoses:  Exacerbation of intermittent asthma, unspecified asthma severity    Rx / DC Orders ED Discharge Orders          Ordered    predniSONE  (DELTASONE ) 10 MG tablet  Daily        05/24/24 0506              Rexie Catena, PA-C 05/24/24 4098    Eve Hinders, MD 05/24/24 2302

## 2024-05-25 ENCOUNTER — Telehealth: Payer: Self-pay

## 2024-05-25 NOTE — Telephone Encounter (Signed)
 Patient called in - DOB/DPR verified - stated she was seen [facility] yesterday, 05/24/24 -  Dx: Differential diagnosis includes but is not limited to anaphylaxis, asthma exacerbation, COPD exacerbation  Per ED office notes:   Patient presents with concern for difficulty breathing after being around cut grass earlier today. States she started to notice her asthma flareup and tried taking her home inhaler which did not help relieve symptoms. EMS was called and they gave patient DuoNeb treatments as well as Solu-Medrol  and magnesium . Patient still reporting shortness of breath upon arrival.   Hemoglobin A1c at 5.7 at last check.    Discussed that we can add on oral prednisone  for at home for this acute asthma/COPD exacerbation.  She is in agreement with this plan.   Patient stated she wanted to make sure she could take Prednisone  with medications prescribed on 05/23/24.  Per 05/23/24 office notes:  1. Moderate persistent asthma, uncomplicated - Lung testing looked a bit better today. - We are going to start all nebulized medication - STOP the Trelegy.  - Daily controller medication(s): Pulmicort  (budesonide ) 0.5 mg + Perforomist  (formoterol ) MIXED TOGETHER TWICE DAILY VIA NEBULIZER + Singulair  (montelukast ) 10mg  daily - Prior to physical activity: albuterol  2 puffs 10-15 minutes before physical activity. - Rescue medications: albuterol  4 puffs every 4-6 hours as needed and albuterol  nebulizer one vial every 4-6 hours as needed  Patient advised the Prednisone  was added on to what she was prescribed on 05/23/24 - okay to continue all medications - try to have chest xray done since one wasn't done while she was at ER. Patient stated she was trying to rest but once she's feeling better she will have chest xray done.  Patient advised message would be forwarded to provider as update.  Patient verbalized understanding to all, no further questions.

## 2024-05-29 ENCOUNTER — Encounter: Payer: Self-pay | Admitting: Pulmonary Disease

## 2024-05-29 ENCOUNTER — Other Ambulatory Visit (HOSPITAL_COMMUNITY): Payer: Self-pay

## 2024-05-29 ENCOUNTER — Ambulatory Visit: Admitting: Pulmonary Disease

## 2024-05-29 ENCOUNTER — Telehealth: Payer: Self-pay | Admitting: Cardiology

## 2024-05-29 ENCOUNTER — Telehealth: Payer: Self-pay

## 2024-05-29 VITALS — BP 135/81 | HR 90 | Ht 59.0 in | Wt 167.2 lb

## 2024-05-29 DIAGNOSIS — Z79899 Other long term (current) drug therapy: Secondary | ICD-10-CM | POA: Diagnosis not present

## 2024-05-29 DIAGNOSIS — I272 Pulmonary hypertension, unspecified: Secondary | ICD-10-CM | POA: Diagnosis not present

## 2024-05-29 DIAGNOSIS — J4550 Severe persistent asthma, uncomplicated: Secondary | ICD-10-CM

## 2024-05-29 LAB — SEDIMENTATION RATE: Sed Rate: 62 mm/h — ABNORMAL HIGH (ref 0–40)

## 2024-05-29 NOTE — Telephone Encounter (Signed)
 Pt c/o medication issue:  1. Name of Medication:   empagliflozin  (JARDIANCE ) 10 MG TABS tablet    2. How are you currently taking this medication (dosage and times per day)? As written   3. Are you having a reaction (difficulty breathing--STAT)? No   4. What is your medication issue? Pt would like to know if there is a alternative for this medication dues to her not being able to afford the refills. Please advise

## 2024-05-29 NOTE — Patient Instructions (Addendum)
 VISIT SUMMARY:  Today, you came in for a follow-up visit after recent hospitalizations for asthma, heart failure, and chronic kidney disease. You were accompanied by your daughter, [name]. We discussed your current treatment plan and made some adjustments to better manage your conditions.  YOUR PLAN:  -ASTHMA: Asthma is a condition where your airways become inflamed and narrow, making it hard to breathe. You were recently hospitalized due to an asthma exacerbation. You should continue using Pulmicort  and Perforomist  nebulizers and Singulair . We discussed the possibility of starting a biologic therapy like Dupixent or Tezspire if your asthma is not well-controlled. You may also consider taking over-the-counter Zyrtec for allergies.  -PULMONARY HYPERTENSION: Pulmonary hypertension is high blood pressure in the lungs' arteries, often due to lung issues, sleep apnea, and heart valve problems.  Your cardiologist has planned a VQ scan and a sleep study to further evaluate your condition.  -HEART FAILURE: Heart failure occurs when your heart doesn't pump blood as well as it should. This is contributing to your pulmonary hypertension and shortness of breath. We will repeat an echocardiogram as ordered by your cardiologist to monitor your heart function.  -AORTIC STENOSIS AND MITRAL REGURGITATION: Aortic stenosis and mitral regurgitation are conditions where your heart valves don't work properly, causing blood to back up into your lungs. This contributes to your pulmonary hypertension and shortness of breath. We will continue to monitor these conditions with a repeat echocardiogram.  -CHRONIC KIDNEY DISEASE: Chronic kidney disease means your kidneys are not working as well as they should, which can lead to fluid buildup and worsen your lung symptoms. We will continue to monitor your kidney function.  -GASTROESOPHAGEAL REFLUX DISEASE (GERD): GERD is a condition where stomach acid frequently flows back into the  tube connecting your mouth and stomach, causing heartburn. You should continue taking Tums as needed to manage your symptoms.  INSTRUCTIONS:  Please proceed with the VQ scan, sleep study, and repeat echocardiogram as planned. Continue with your current medications and consider adding over-the-counter Zyrtec for allergies. Follow up with your cardiologist and nephrologist as scheduled.

## 2024-05-29 NOTE — Progress Notes (Signed)
 Mary Chase    [id number]    1935/04/14  Primary Care Physician:Varadarajan, Willena Harp, MD  Referring Physician: Arva Lathe, MD [address] [place],  [place] [address]  Chief complaint: Consult for asthma  HPI: 89 y.o. who  has a past medical history of Age related osteoporosis, Allergic rhinitis, Aortic stenosis, Arthritis, Ascending aorta dilatation (HCC), Asthma, Atherosclerosis of native artery of both lower extremities with intermittent claudication (HCC), CAD (coronary artery disease), native coronary artery, Cataract, Chronic kidney disease, stage 3a (HCC), COPD (chronic obstructive pulmonary disease) (HCC), Degenerative joint disease (DJD) of hip, Dyspnea, GERD (gastroesophageal reflux disease), Headache, Hypercholesterolemia, Hypertension, Hypertensive retinopathy, Mitral regurgitation, Osteoarthritis of hip, Pneumonia, Pre-diabetes, Pulmonary hypertension (HCC), PVD (peripheral vascular disease) (HCC), Sickle cell anemia (HCC), Sickle cell trait (HCC), and Tricuspid regurgitation.   She has history of allergic asthma and follows with Dr. Idolina Maker with positive RAST panel and significantly elevated IgE.  Maintained on Wixela and Singulair .  Hospitalized in May 2024 with asthma exacerbation secondary to parainfluenza virus.  At that time lisinopril  was held and she was started on losartan  to help with the cough.  She did not feel well postdischarge and never recovered back to baseline and hospitalized overnight on 7/15 for dyspnea.  Her virus panel at this time were negative.  She was treated with IV steroids, empiric doxycycline  with improvement in symptoms   Pets: No pets Occupation: Retired [employer] operational officer Exposures: No mold, hot tub, Jacuzzi.  No feather pillows or comforters Smoking history: Never smoker Travel history: No significant travel history Relevant family history: No family history of lung disease  Interim History Discussed the  use of AI scribe software for clinical note transcription with the patient, who gave verbal consent to proceed.  History of Present Illness Mary Chase is an 89 year old female with asthma, heart failure, and chronic kidney disease who presents for follow-up after recent hospitalizations. She is accompanied by her daughter, Mary Chase.  She was hospitalized in early May 2025 for exacerbations of asthma, heart failure, and chronic kidney disease. She also visited the emergency room on June 3rd due to a recurrence of symptoms after seeing her allergy doctor earlier that day and was discharged the next morning.  Her asthma management was recently adjusted. Trelegy was discontinued, and she was started on nebulized Pulmicort  and Perforomist . She continues to take Singulair . There was a discussion about starting Dupixent, but it has not been initiated. She has been on prednisone  intermittently, approximately four to five days every two to three months, and is currently finishing a course. She does not take regular allergy medications like Zyrtec.  She is under the care of a cardiologist for heart failure and is being evaluated for pulmonary hypertension. She has aortic stenosis and mitral regurgitation. A VQ scan, a sleep study, and a repeat ultrasound of the heart have been ordered to further evaluate her condition. She is also scheduled for lab tests today.  She experiences reflux symptoms, which she manages with over-the-counter Tums. These symptoms had previously subsided but have recently returned.  A CT scan from her May hospitalization showed bronchial inflammation and possible consolidation. A follow-up CT is planned in three months to ensure resolution.    Outpatient Encounter Medications as of 05/29/2024  Medication Sig   acetaminophen  (TYLENOL ) 500 MG tablet Take 500 mg by mouth in the morning and at bedtime.   albuterol  (PROVENTIL ) (2.5 MG/3ML) 0.083% nebulizer solution TAKE 3 ML (  2.5 MG  TOTAL) BY NEBULIZATION EVERY 4 HOURS AS NEEDED FOR WHEEZING OR SHORTNESS OF BREATH   amLODipine  (NORVASC ) 10 MG tablet Take 10 mg by mouth daily.   aspirin  EC 81 MG tablet Take 1 tablet (81 mg total) by mouth 2 (two) times daily.   budesonide  (PULMICORT ) 0.5 MG/2ML nebulizer solution Take 2 mLs (0.5 mg total) by nebulization in the morning and at bedtime.   Cholecalciferol (VITAMIN D3) 25 MCG (1000 UT) CAPS Take 1,000 Units by mouth daily in the afternoon.   Cyanocobalamin (B-12) 5000 MCG CAPS Take 5,000 mcg by mouth daily.   empagliflozin  (JARDIANCE ) 10 MG TABS tablet Take 1 tablet (10 mg total) by mouth daily.   fluticasone  (FLONASE ) 50 MCG/ACT nasal spray PLACE 1 SPRAY INTO BOTH NOSTRILS DAILY AS NEEDED FOR ALLERGIES OR RHINITIS.   Fluticasone -Umeclidin-Vilant (TRELEGY ELLIPTA ) 200-62.5-25 MCG/ACT AEPB Inhale 1 puff into the lungs daily.   formoterol  (PERFOROMIST ) 20 MCG/2ML nebulizer solution Take 2 mLs (20 mcg total) by nebulization 2 (two) times daily.   furosemide  (LASIX ) 20 MG tablet Take 20 mg by mouth daily as needed for edema or fluid.   losartan  (COZAAR ) 50 MG tablet Take 1 tablet (50 mg total) by mouth daily.   montelukast  (SINGULAIR ) 10 MG tablet Take 1 tablet (10 mg total) by mouth daily.   predniSONE  (DELTASONE ) 10 MG tablet Take 4 tablets (40 mg total) by mouth daily for 5 days.   rosuvastatin  (CRESTOR ) 5 MG tablet TAKE 1 TABLET BY MOUTH EVERY OTHER DAY   spironolactone  (ALDACTONE ) 25 MG tablet Take 0.5 tablets (12.5 mg total) by mouth daily.   No facility-administered encounter medications on file as of 05/29/2024.   Physical Exam: Blood pressure 135/81, pulse 90, height 4\' 11"  (1.499 m), weight 167 lb 3.2 oz (75.8 kg), SpO2 98%. Gen:      No acute distress HEENT:  EOMI, sclera anicteric Neck:     No masses; no thyromegaly Lungs:    Clear to auscultation bilaterally; normal respiratory effort CV:         Regular rate and rhythm; no murmurs Abd:      + bowel sounds; soft,  non-tender; no palpable masses, no distension Ext:    No edema; adequate peripheral perfusion Neuro: alert and oriented x 3 Psych: normal mood and affect   Data Reviewed: Imaging: CT abdomen pelvis 08/28/2022-mild atelectasis at the base.  CT coronaries 06/29/2023- bibasilar linear atelectasis/scarring.  CTA 04/22/2024-no pulmonary embolism, cardiomegaly with small pericardial effusion, ill-defined airspace disease in the anterior segment of the right upper lobe, consolidation in the lingula base. I had reviewed the images personally  PFTs: Spirometry 05/28/2023 FVC 2.52 [147%], FEV1 0.69 [53%], F/F 0.27 Moderate obstructive airway disease  10/12/2023 FVC 1.27 [66%], FEV1 0.78 [56%], F/F61, TLC 4.54 [102%], DLCO 10.56 [65%] Mild obstruction, mild diffusion defect  Labs: CBC 04/14/2022 WBC 8.2, eos 6%, absolute eosinophil count 492 CBC 07/07/2023 WBC 16.8, eos 0% IgE 07/07/2023- 4778  FENO 07/07/2023- 54  Assessment & Plan Asthma Asthma exacerbation requiring recent hospitalization and emergency room visit.  She was on Trelegy but changed to nebulizers by Dr. Idolina Maker last month.  Current management includes nebulizers (Pulmicort  and Perforomist ) and Singulair . Biologic therapy with Dupixent or tezpire was discussed by Dr. Idolina Maker from allergy and immunology but patient is reluctant to start due to concerns about cost and potential side effects. Emphasized the effectiveness of biologics in preventing hospitalizations and suggested checking insurance coverage for potential cost assistance. Discussed the alternative of  using prednisone  during exacerbations, noting its side effects may be worse than those of biologics.  - Continue Pulmicort  and Perforomist  nebulizers - Continue Singulair  - Consider biologic therapy (Dupixent or Tezspire) if asthma exacerbations are not controlled with current therapy - Consider over-the-counter Zyrtec for allergies  Abnormal CT Previous CT scan showed some  consolidation and interstitial changes likely related to pneumonia Order follow-up CT in 3 months to ensure resolution  Pulmonary hypertension Pulmonary hypertension likely secondary to lung issues, sleep apnea, and heart valve problems. A VQ scan and sleep study are planned by cardiology to further evaluate the condition. - Proceed with VQ scan - Proceed with sleep study  Aortic stenosis and mitral regurgitation Aortic stenosis and mitral regurgitation causing backup of blood into the lungs, contributing to pulmonary hypertension and dyspnea. Explained the valve issues and their impact on pulmonary symptoms. - Repeat echocardiogram as ordered by cardiology  Chronic kidney disease Chronic kidney disease contributing to volume overload and potential exacerbation of pulmonary symptoms.  Gastroesophageal reflux disease (GERD) Intermittent GERD symptoms managed with over-the-counter Tums. - Continue Tums as needed   Plan/Recommendations: Continue Pulmicort  mild performers, Singulair  Follow-up CT scan  Phyllis Breeze MD [facility] 05/29/2024, 9:24 AM  CC: Arva Lathe,*

## 2024-05-29 NOTE — Telephone Encounter (Signed)
 Patient Advocate Encounter   The patient was approved for a [employer] grant that will help cover the cost of JARDIANCE  Total amount awarded, $4,500.  Effective: 04/29/24 - 04/28/25   [id number] [id number]   Pharmacy provided with approval and processing information. Patient informed via Tilman Fonder, CPhT  Pharmacy Patient Advocate Specialist  Direct Number: [phone] Fax: [phone]

## 2024-05-29 NOTE — Telephone Encounter (Signed)
 Enrolled in grant, see separate encounter for details

## 2024-05-29 NOTE — Addendum Note (Signed)
 Addended by: Anthon Baston A on: 05/29/2024 10:21 AM   Modules accepted: Orders

## 2024-05-29 NOTE — Telephone Encounter (Signed)
 Patient called in regarding getting information on Dupixent. I informed her that Tammy, biologic coordinator would reach out to her in regards to getting financial information and getting the process going. Will forward to Tammy. Verbalized understanding.

## 2024-05-30 ENCOUNTER — Telehealth: Payer: Self-pay | Admitting: Cardiology

## 2024-05-30 ENCOUNTER — Ambulatory Visit: Payer: Self-pay | Admitting: Cardiology

## 2024-05-30 LAB — C-REACTIVE PROTEIN: CRP: <1 mg/L (ref 0–10)

## 2024-05-30 NOTE — Telephone Encounter (Signed)
*  STAT* If patient is at the pharmacy, call can be transferred to refill team.   1. Which medications need to be refilled? (please list name of each medication and dose if known)  empagliflozin  (JARDIANCE ) 10 MG TABS tablet  spironolactone  (ALDACTONE ) 25 MG tablet    2. Would you like to learn more about the convenience, safety, & potential cost savings by using the [facility]?     3. Are you open to using the [facility] (Type [facility].  ).   4. Which pharmacy/location (including street and city if local pharmacy) is medication to be sent to? Patient would like for script to go to [facility]   5. Do they need a 30 day or 90 day supply? 90 day  Patient is out of medication.

## 2024-05-30 NOTE — Telephone Encounter (Signed)
 Call to patient to advise that Sedimentation rate is moderately elevated at 62 but CRP is normal. Advised patient to get in with her PCP to review this further as she may need further autoimmune workup.  Patient verbalized understanding, results forwarded to PCP.

## 2024-05-31 ENCOUNTER — Other Ambulatory Visit (HOSPITAL_COMMUNITY): Payer: Self-pay

## 2024-05-31 MED ORDER — EMPAGLIFLOZIN 10 MG PO TABS
10.0000 mg | ORAL_TABLET | Freq: Every day | ORAL | 3 refills | Status: AC
  Filled 2024-05-31: qty 90, 90d supply, fill #0
  Filled 2024-08-23: qty 90, 90d supply, fill #1
  Filled 2024-12-07: qty 90, 90d supply, fill #2

## 2024-05-31 MED ORDER — SPIRONOLACTONE 25 MG PO TABS
12.5000 mg | ORAL_TABLET | Freq: Every day | ORAL | 3 refills | Status: AC
  Filled 2024-05-31 – 2024-06-29 (×2): qty 45, 90d supply, fill #0
  Filled 2024-09-13: qty 45, 90d supply, fill #1
  Filled 2024-12-31: qty 45, 90d supply, fill #2

## 2024-05-31 NOTE — Telephone Encounter (Signed)
 Pt's medications were sent to pt's pharmacy as requested. Confirmation received.

## 2024-06-02 ENCOUNTER — Other Ambulatory Visit (HOSPITAL_COMMUNITY): Payer: Self-pay

## 2024-06-05 ENCOUNTER — Other Ambulatory Visit (HOSPITAL_COMMUNITY): Payer: Self-pay

## 2024-06-06 ENCOUNTER — Ambulatory Visit
Admission: RE | Admit: 2024-06-06 | Discharge: 2024-06-06 | Disposition: A | Discharge status: Home / Self Care | Source: Ambulatory Visit | Attending: Cardiology | Admitting: Cardiology

## 2024-06-06 ENCOUNTER — Ambulatory Visit: Admitting: Allergy & Immunology

## 2024-06-06 ENCOUNTER — Other Ambulatory Visit: Payer: Self-pay

## 2024-06-06 ENCOUNTER — Encounter: Payer: Self-pay | Admitting: Allergy & Immunology

## 2024-06-06 VITALS — BP 122/66 | HR 85 | Temp 98.4°F | Resp 20

## 2024-06-06 DIAGNOSIS — J330 Polyp of nasal cavity: Secondary | ICD-10-CM

## 2024-06-06 DIAGNOSIS — J4489 Other specified chronic obstructive pulmonary disease: Secondary | ICD-10-CM

## 2024-06-06 DIAGNOSIS — R0602 Shortness of breath: Secondary | ICD-10-CM | POA: Diagnosis not present

## 2024-06-06 DIAGNOSIS — R768 Other specified abnormal immunological findings in serum: Secondary | ICD-10-CM | POA: Diagnosis not present

## 2024-06-06 DIAGNOSIS — J302 Other seasonal allergic rhinitis: Secondary | ICD-10-CM | POA: Diagnosis not present

## 2024-06-06 DIAGNOSIS — J3089 Other allergic rhinitis: Secondary | ICD-10-CM | POA: Diagnosis not present

## 2024-06-06 DIAGNOSIS — I272 Pulmonary hypertension, unspecified: Secondary | ICD-10-CM

## 2024-06-06 NOTE — Progress Notes (Signed)
 FOLLOW UP  Date of Service/Encounter:  06/06/24   Assessment:   Moderate persistent asthma, uncomplicated - with three rounds of prednisone  in less than 6 months   Strongly consider Dupixent (have discussed with patient and she is worried about the side effects), seems open to [employer] (provided handout today)   Chronic rhinitis (grasses, weeds, ragweed, trees, molds, dust mites, roach, cat, dog) - s/p years of allergen immunotherapy   Elevated IgE - with normal SPEP and UPEP    Polyp of left nasal cavity - much better with the regular use of fluticasone  nasal spray  Plan/Recommendations:   1. Moderate persistent asthma, uncomplicated - Lung testing not done today. - Chest X-ray looks good, but the OFFICIAL READ is pending.  - I will talk to Dr. Irving Mantle to see what he thinks.  - I would continue with the same recommendations for your maintenance nebulizers (EVERY 12 HOURS).  - Daily controller medication(s): Pulmicort  (budesonide ) 0.5 mg + Perforomist  (formoterol ) MIXED TOGETHER TWICE DAILY VIA NEBULIZER + Singulair  (montelukast ) 10mg  daily - Prior to physical activity: albuterol  2 puffs 10-15 minutes before physical activity. - Rescue medications: albuterol  4 puffs every 4-6 hours as needed and albuterol  nebulizer one vial every 4-6 hours as needed - Asthma control goals:  * Full participation in all desired activities (may need albuterol  before activity) * Albuterol  use two time or less a week on average (not counting use with activity) * Cough interfering with sleep two time or less a month * Oral steroids no more than once a year * No hospitalizations  2. 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 - Continue with Zyrtec (cetirizine) 10mg  daily as needed for runny nose/sneezing.  - Continue with Mucinex  as needed.  3. Low blood pressure at home - Definitely bring this up with your cardiologist or PCP.   4. Return in  about 3 months (around 09/06/2024). LOVED MEETING YOUR FAVORITE DAUGHTER TODAY!   Subjective:   Mary Chase is a 89 y.o. female presenting today for follow up of  Chief Complaint  Patient presents with   Asthma    Doing a lot better since last visit. Thinks this last medication change has made a lot of improvement.     Mary Chase has a history of the following: Patient Active Problem List   Diagnosis Date Noted   Acute asthma exacerbation 04/22/2024   Obesity, class 1 04/22/2024   Seasonal and perennial allergic rhinitis 01/06/2024   Polyp of nasal cavity 01/06/2024   Moderate persistent asthma with acute exacerbation 07/05/2023   CKD stage 3a, GFR 45-59 ml/min (HCC) 07/05/2023   CAD (coronary artery disease), native coronary artery 06/30/2023   Edema of both ankles 06/22/2023   Ascending aorta dilatation (HCC) 06/04/2023   Pulmonary hypertension (HCC) 06/04/2023   Mitral regurgitation 06/04/2023   Tricuspid regurgitation 06/04/2023   Acute on chronic diastolic CHF (congestive heart failure) (HCC) 06/04/2023   Asthma, chronic, unspecified asthma severity, with acute exacerbation 05/03/2023   HTN (hypertension) 05/03/2023   Acute respiratory failure with hypoxia (HCC) 05/03/2023   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   Osteoarthritis of right hip 09/11/2020   H/O allergic rhinitis 08/31/2015    History obtained from: chart review and patient and his lovely daughter, who works at [employer].   Discussed the use of AI scribe software for clinical note transcription with the patient and/or guardian, who gave verbal consent to proceed.  Mary Chase is a 89 y.o. female presenting for a follow up visit.  She was last seen in June or 2025.  At that time, lung testing looked a bit better.  We started her on all nebulized medications including Pulmicort  and Perforomist  twice daily and Singulair  10 mg daily.  For her rhinitis, we will continue with Flonase  and Mucinex .   She had low blood pressure.  We recommended that she talk to her PCP about changing her blood pressure medications.  Since last visit, she has done well.   Asthma/Respiratory Symptom History: Her breathing has improved significantly with the use of nebulized medications, which she uses every twelve hours, replacing her previous medication, Trelegy. She feels more energetic and has experienced an overall improvement in her condition since starting the nebulizer. However, she still experiences some shortness of breath, though not to the extent of having an attack. She recalls a recent visit to the emergency department on the same day as her last appointment, where she was not admitted but received treatment, possibly steroids. She does like the new nebulized medications and feel that they are working well to control her asthma.   Allergic Rhinitis Symptom History: She is also on montelukast  (Singulair ) and has been considering the use of Zyrtec for allergies, although she is not currently experiencing significant allergy symptoms. She has been more active, walking up and down the hall and to the bathroom, and has been giving herself daily assignments to stay active indoors due to the heat outside.  She has a history of low blood pressure, which she attributes to her diuretics. She is currently on Jardiance  for her heart and kidney issues. She recently underwent a chest x-ray; she was unsure of the specific reason for the test, but it may have been related to her shortness of breath and possible blood clot concerns. Her blood pressure has been low frequently.  She reports a bruise on her arm that has been present for a while, which she finds unusual as she has not had blood drawn from that area. She is on aspirin  and has a history of bruising easily. Her son lives with her.   Otherwise, there have been no changes to her past medical history, surgical history, family history, or social  history.    Review of systems otherwise negative other than that mentioned in the HPI.    Objective:   Blood pressure 122/66, pulse 85, temperature 98.4 F (36.9 C), temperature source Temporal, resp. rate 20, SpO2 98%. There is no height or weight on file to calculate BMI.    Physical Exam Vitals reviewed.  Constitutional:      Appearance: Normal appearance. She is well-developed.     Comments: Very pleasant female. Talkative. Boisterous.   HENT:     Head: Normocephalic and atraumatic.     Right Ear: Tympanic membrane, ear canal and external ear normal. No drainage, swelling or tenderness. Tympanic membrane is not injected, scarred, erythematous, retracted or bulging.     Left Ear: Tympanic membrane, ear canal and external ear normal. No drainage, swelling or tenderness. Tympanic membrane is not injected, scarred, erythematous, retracted or bulging.     Nose: No nasal deformity, septal deviation, mucosal edema or rhinorrhea.     Right Turbinates: Enlarged, swollen and pale.     Left Turbinates: Enlarged, swollen and pale.     Right Sinus: No maxillary sinus tenderness or frontal sinus tenderness.     Left Sinus: No maxillary sinus tenderness or frontal sinus  tenderness.     Comments: Chronic rhinitis. No polyps.      Mouth/Throat:     Lips: Pink.     Mouth: Mucous membranes are moist. Mucous membranes are not pale and not dry.     Pharynx: Uvula midline.     Comments: Cobblestoning in the posterior oropharynx.   Eyes:     General: Lids are normal. Allergic shiner present.        Right eye: No discharge.        Left eye: No discharge.     Conjunctiva/sclera: Conjunctivae normal.     Right eye: Right conjunctiva is not injected. No chemosis.    Left eye: Left conjunctiva is not injected. No chemosis.    Pupils: Pupils are equal, round, and reactive to light.    Cardiovascular:     Rate and Rhythm: Normal rate and regular rhythm.     Heart sounds: Normal heart sounds.   Pulmonary:     Effort: Pulmonary effort is normal. No tachypnea, accessory muscle usage or respiratory distress.     Breath sounds: No decreased air movement. No wheezing, rhonchi or rales.     Comments: Decreased air movement at the bases. No crackles noted. Chest:     Chest wall: No tenderness.   Musculoskeletal:     Cervical back: No rigidity.  Lymphadenopathy:     Head:     Right side of head: No submandibular, tonsillar or occipital adenopathy.     Left side of head: No submandibular, tonsillar or occipital adenopathy.     Cervical: No cervical adenopathy.   Skin:    Coloration: Skin is not pale.     Findings: No abrasion, erythema, petechiae or rash. Rash is not papular, urticarial or vesicular.   Neurological:     Mental Status: She is alert.   Psychiatric:        Behavior: Behavior is cooperative.      Diagnostic studies: none     Drexel Gentles, MD  [facility] 

## 2024-06-06 NOTE — Telephone Encounter (Signed)
 L/m for patient to contact me to discuss affordability for Dupixent

## 2024-06-06 NOTE — Patient Instructions (Addendum)
 1. Moderate persistent asthma, uncomplicated - Lung testing not done today. - Chest X-ray looks good, but the OFFICIAL READ is pending.  - I will talk to Dr. Irving Mantle to see what he thinks.  - I would continue with the same recommendations for your maintenance nebulizers (EVERY 12 HOURS).  - Daily controller medication(s): Pulmicort  (budesonide ) 0.5 mg + Perforomist  (formoterol ) MIXED TOGETHER TWICE DAILY VIA NEBULIZER + Singulair  (montelukast ) 10mg  daily - Prior to physical activity: albuterol  2 puffs 10-15 minutes before physical activity. - Rescue medications: albuterol  4 puffs every 4-6 hours as needed and albuterol  nebulizer one vial every 4-6 hours as needed - Asthma control goals:  * Full participation in all desired activities (may need albuterol  before activity) * Albuterol  use two time or less a week on average (not counting use with activity) * Cough interfering with sleep two time or less a month * Oral steroids no more than once a year * No hospitalizations  2. 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 - Continue with Zyrtec (cetirizine) 10mg  daily as needed for runny nose/sneezing.  - Continue with Mucinex  as needed.  3. Low blood pressure at home - Definitely bring this up with your cardiologist or PCP.   4. Return in about 3 months (around 09/06/2024). LOVED MEETING YOUR FAVORITE DAUGHTER TODAY!     Please inform us  of any Emergency Department visits, hospitalizations, or changes in symptoms. Call us  before going to the ED for breathing or allergy symptoms since we might be able to fit you in for a sick visit. Feel free to contact us  anytime with any questions, problems, or concerns.  It was a pleasure to see you again today! I am glad that you are feeling better!   Websites that have reliable patient information: 1. American Academy of Asthma, Allergy, and Immunology: www.aaaai.org 2. Food Allergy Research and  Education (FARE): foodallergy.org 3. Mothers of Asthmatics: http://www.asthmacommunitynetwork.org 4. American College of Allergy, Asthma, and Immunology: www.acaai.org      "Like" us  on Facebook and Instagram for our latest updates!      A healthy democracy works best when [employer] participate! Make sure you are registered to vote! If you have moved or changed any of your contact information, you will need to get this updated before voting! Scan the QR codes below to learn more!

## 2024-06-12 ENCOUNTER — Other Ambulatory Visit: Payer: Self-pay | Admitting: Cardiology

## 2024-06-12 DIAGNOSIS — I272 Pulmonary hypertension, unspecified: Secondary | ICD-10-CM

## 2024-06-14 ENCOUNTER — Other Ambulatory Visit: Payer: Self-pay | Admitting: Allergy & Immunology

## 2024-06-16 ENCOUNTER — Other Ambulatory Visit (HOSPITAL_COMMUNITY): Payer: Self-pay

## 2024-06-16 ENCOUNTER — Ambulatory Visit (HOSPITAL_COMMUNITY)
Admission: RE | Admit: 2024-06-16 | Discharge: 2024-06-16 | Disposition: A | Discharge status: Home / Self Care | Source: Ambulatory Visit | Attending: Cardiology | Admitting: Cardiology

## 2024-06-16 ENCOUNTER — Encounter (HOSPITAL_COMMUNITY)
Admission: RE | Admit: 2024-06-16 | Discharge: 2024-06-16 | Disposition: A | Discharge status: Home / Self Care | Source: Ambulatory Visit | Attending: Cardiology | Admitting: Cardiology

## 2024-06-16 DIAGNOSIS — I771 Stricture of artery: Secondary | ICD-10-CM | POA: Diagnosis not present

## 2024-06-16 DIAGNOSIS — I272 Pulmonary hypertension, unspecified: Secondary | ICD-10-CM | POA: Insufficient documentation

## 2024-06-16 DIAGNOSIS — J9811 Atelectasis: Secondary | ICD-10-CM | POA: Diagnosis not present

## 2024-06-16 DIAGNOSIS — I700 Atherosclerosis of aorta: Secondary | ICD-10-CM | POA: Diagnosis not present

## 2024-06-16 MED ORDER — TECHNETIUM TO 99M ALBUMIN AGGREGATED
4.4000 | Freq: Once | INTRAVENOUS | Status: AC | PRN
Start: 2024-06-16 — End: 2024-06-16
  Administered 2024-06-16: 4.4 via INTRAVENOUS

## 2024-06-19 ENCOUNTER — Ambulatory Visit: Payer: Self-pay | Admitting: Cardiology

## 2024-06-19 DIAGNOSIS — H35039 Hypertensive retinopathy, unspecified eye: Secondary | ICD-10-CM | POA: Diagnosis not present

## 2024-06-19 DIAGNOSIS — I130 Hypertensive heart and chronic kidney disease with heart failure and stage 1 through stage 4 chronic kidney disease, or unspecified chronic kidney disease: Secondary | ICD-10-CM | POA: Diagnosis not present

## 2024-06-19 DIAGNOSIS — I2510 Atherosclerotic heart disease of native coronary artery without angina pectoris: Secondary | ICD-10-CM | POA: Diagnosis not present

## 2024-06-19 DIAGNOSIS — M810 Age-related osteoporosis without current pathological fracture: Secondary | ICD-10-CM | POA: Diagnosis not present

## 2024-06-19 DIAGNOSIS — J441 Chronic obstructive pulmonary disease with (acute) exacerbation: Secondary | ICD-10-CM | POA: Diagnosis not present

## 2024-06-19 DIAGNOSIS — Z7984 Long term (current) use of oral hypoglycemic drugs: Secondary | ICD-10-CM | POA: Diagnosis not present

## 2024-06-19 DIAGNOSIS — N1831 Chronic kidney disease, stage 3a: Secondary | ICD-10-CM | POA: Diagnosis not present

## 2024-06-19 DIAGNOSIS — Z7951 Long term (current) use of inhaled steroids: Secondary | ICD-10-CM | POA: Diagnosis not present

## 2024-06-19 DIAGNOSIS — Z7982 Long term (current) use of aspirin: Secondary | ICD-10-CM | POA: Diagnosis not present

## 2024-06-19 DIAGNOSIS — E7800 Pure hypercholesterolemia, unspecified: Secondary | ICD-10-CM | POA: Diagnosis not present

## 2024-06-19 DIAGNOSIS — K219 Gastro-esophageal reflux disease without esophagitis: Secondary | ICD-10-CM | POA: Diagnosis not present

## 2024-06-19 DIAGNOSIS — I080 Rheumatic disorders of both mitral and aortic valves: Secondary | ICD-10-CM | POA: Diagnosis not present

## 2024-06-19 DIAGNOSIS — E876 Hypokalemia: Secondary | ICD-10-CM | POA: Diagnosis not present

## 2024-06-19 DIAGNOSIS — J45901 Unspecified asthma with (acute) exacerbation: Secondary | ICD-10-CM | POA: Diagnosis not present

## 2024-06-19 DIAGNOSIS — I70213 Atherosclerosis of native arteries of extremities with intermittent claudication, bilateral legs: Secondary | ICD-10-CM | POA: Diagnosis not present

## 2024-06-19 DIAGNOSIS — R7303 Prediabetes: Secondary | ICD-10-CM | POA: Diagnosis not present

## 2024-06-19 DIAGNOSIS — I272 Pulmonary hypertension, unspecified: Secondary | ICD-10-CM | POA: Diagnosis not present

## 2024-06-19 DIAGNOSIS — I5033 Acute on chronic diastolic (congestive) heart failure: Secondary | ICD-10-CM | POA: Diagnosis not present

## 2024-06-19 DIAGNOSIS — N179 Acute kidney failure, unspecified: Secondary | ICD-10-CM | POA: Diagnosis not present

## 2024-06-21 ENCOUNTER — Other Ambulatory Visit: Payer: Self-pay | Admitting: Pulmonary Disease

## 2024-06-21 NOTE — Telephone Encounter (Signed)
 [facility] calling saying this PT may have a PE on VQ scan. Needs ASAP appt. Nothing avail. Please call PT to advise.

## 2024-06-21 NOTE — Telephone Encounter (Signed)
 Dr. Theophilus, please review recent VQ scan and determine if patient needs to be seen sooner then sept?

## 2024-06-21 NOTE — Telephone Encounter (Signed)
-----   Message from Wilbert Bihari sent at 06/19/2024 10:10 AM EDT ----- Please get this patient in with Pulmonary ASAP this week preferably today or tomorrow and preferably with Dr. Byrum for possible chronic PE on VQ scan ----- Message ----- From: Interface, Rad Results In Sent: 06/16/2024   3:16 PM EDT To: Wilbert JONELLE Bihari, MD

## 2024-06-21 NOTE — Telephone Encounter (Signed)
 Call to patient to advise that while CXR was normal, VQ showed possible chronic PE and Dr. Shlomo recommends pulmonology referral ASAP. Patient states she has a pulmonologist but her f/u is not until September. Advised I would talk to Dr. Jiles office to see if they can get her in sooner, patient verbalizes understanding and agrees to plan.   Call placed to Dr. Jiles office to ask that patient be seen ASAP. Spoke with Roslyn at front desk who stated she would forward to Dr. Jiles team for assistance with scheduling patient to be seen.

## 2024-06-22 NOTE — Telephone Encounter (Signed)
 Shlomo Wilbert SAUNDERS, MD to Janit Geni CROME, RN  Claudene Lebron LABOR, CMA  Mannam, Praveen, MD     06/21/24  4:38 PM Please have her start Eliquis 5mg  BID (since it appears to be chronic PE's per report) and stop ASA  Dr. Theophilus, please advise if you will be prescribing the Eliquis?

## 2024-06-26 ENCOUNTER — Telehealth: Payer: Self-pay | Admitting: Allergy & Immunology

## 2024-06-26 DIAGNOSIS — Z7984 Long term (current) use of oral hypoglycemic drugs: Secondary | ICD-10-CM | POA: Diagnosis not present

## 2024-06-26 DIAGNOSIS — N179 Acute kidney failure, unspecified: Secondary | ICD-10-CM | POA: Diagnosis not present

## 2024-06-26 DIAGNOSIS — R7303 Prediabetes: Secondary | ICD-10-CM | POA: Diagnosis not present

## 2024-06-26 DIAGNOSIS — I130 Hypertensive heart and chronic kidney disease with heart failure and stage 1 through stage 4 chronic kidney disease, or unspecified chronic kidney disease: Secondary | ICD-10-CM | POA: Diagnosis not present

## 2024-06-26 DIAGNOSIS — K219 Gastro-esophageal reflux disease without esophagitis: Secondary | ICD-10-CM | POA: Diagnosis not present

## 2024-06-26 DIAGNOSIS — E7800 Pure hypercholesterolemia, unspecified: Secondary | ICD-10-CM | POA: Diagnosis not present

## 2024-06-26 DIAGNOSIS — I2510 Atherosclerotic heart disease of native coronary artery without angina pectoris: Secondary | ICD-10-CM | POA: Diagnosis not present

## 2024-06-26 DIAGNOSIS — E876 Hypokalemia: Secondary | ICD-10-CM | POA: Diagnosis not present

## 2024-06-26 DIAGNOSIS — Z7951 Long term (current) use of inhaled steroids: Secondary | ICD-10-CM | POA: Diagnosis not present

## 2024-06-26 DIAGNOSIS — I70213 Atherosclerosis of native arteries of extremities with intermittent claudication, bilateral legs: Secondary | ICD-10-CM | POA: Diagnosis not present

## 2024-06-26 DIAGNOSIS — I272 Pulmonary hypertension, unspecified: Secondary | ICD-10-CM | POA: Diagnosis not present

## 2024-06-26 DIAGNOSIS — I080 Rheumatic disorders of both mitral and aortic valves: Secondary | ICD-10-CM | POA: Diagnosis not present

## 2024-06-26 DIAGNOSIS — Z7982 Long term (current) use of aspirin: Secondary | ICD-10-CM | POA: Diagnosis not present

## 2024-06-26 DIAGNOSIS — M810 Age-related osteoporosis without current pathological fracture: Secondary | ICD-10-CM | POA: Diagnosis not present

## 2024-06-26 DIAGNOSIS — H35039 Hypertensive retinopathy, unspecified eye: Secondary | ICD-10-CM | POA: Diagnosis not present

## 2024-06-26 DIAGNOSIS — J441 Chronic obstructive pulmonary disease with (acute) exacerbation: Secondary | ICD-10-CM | POA: Diagnosis not present

## 2024-06-26 DIAGNOSIS — N1831 Chronic kidney disease, stage 3a: Secondary | ICD-10-CM | POA: Diagnosis not present

## 2024-06-26 DIAGNOSIS — I5033 Acute on chronic diastolic (congestive) heart failure: Secondary | ICD-10-CM | POA: Diagnosis not present

## 2024-06-26 DIAGNOSIS — J45901 Unspecified asthma with (acute) exacerbation: Secondary | ICD-10-CM | POA: Diagnosis not present

## 2024-06-26 NOTE — Telephone Encounter (Signed)
 Patient has a 90 day supply of budesonide , but states there is another medication that goes with that, so there are two medications that work together and she needs a refill on the second one, not budesonide , but the second medication. [facility] is [facility], but she states they may be out of it and if someone could [facility] around [facility] that might have it. She states she needs it by end of the day.

## 2024-06-27 ENCOUNTER — Other Ambulatory Visit: Payer: Self-pay | Admitting: Allergy & Immunology

## 2024-06-27 ENCOUNTER — Other Ambulatory Visit: Payer: Self-pay | Admitting: Cardiology

## 2024-06-27 DIAGNOSIS — Z79899 Other long term (current) drug therapy: Secondary | ICD-10-CM

## 2024-06-27 NOTE — Telephone Encounter (Signed)
 We can order from our clinic. Please make follow up at next available in clinic

## 2024-06-27 NOTE — Telephone Encounter (Signed)
 Spoke with patient regarding prior message.Patient stated she is taking aspiring 81mg  2 times a day,spironolatone and lasix  20mg  daily PRN. Advised patient that Dr.Mannam wanted to add Eliquis  5mg  BID. Patient stated she has been dizzy when sitting up and getting up and having blurriness in her eyes . Patient did make a f/u up but wanted Dr.Mannam to know about her medication's and her issues before starting another medication.  Dr.Mannam can you please advise

## 2024-06-28 MED ORDER — FORMOTEROL FUMARATE 20 MCG/2ML IN NEBU: 20.0000 ug | INHALATION_SOLUTION | Freq: Two times a day (BID) | RESPIRATORY_TRACT | 0 refills | Status: DC

## 2024-06-28 NOTE — Telephone Encounter (Addendum)
 Spoke with patient, she would like a 90 day supply of her Performomist to have enough to go with the budesonide  this one time. Patient would prefer 30 day supply and not 90 day supply going forward. A 90 day supply of Performomist was sent to the requested pharmacy and patient was ware.

## 2024-06-29 ENCOUNTER — Other Ambulatory Visit (HOSPITAL_COMMUNITY): Payer: Self-pay

## 2024-06-29 MED ORDER — APIXABAN 5 MG PO TABS: 5.0000 mg | ORAL_TABLET | Freq: Two times a day (BID) | ORAL | 0 refills | Status: DC

## 2024-07-04 ENCOUNTER — Encounter (HOSPITAL_COMMUNITY): Payer: Self-pay | Admitting: Emergency Medicine

## 2024-07-04 ENCOUNTER — Ambulatory Visit (INDEPENDENT_AMBULATORY_CARE_PROVIDER_SITE_OTHER)

## 2024-07-04 ENCOUNTER — Ambulatory Visit (HOSPITAL_COMMUNITY)
Admission: EM | Admit: 2024-07-04 | Discharge: 2024-07-04 | Disposition: A | Discharge status: Home / Self Care | Attending: Physician Assistant | Admitting: Physician Assistant

## 2024-07-04 DIAGNOSIS — L03115 Cellulitis of right lower limb: Secondary | ICD-10-CM

## 2024-07-04 DIAGNOSIS — M79671 Pain in right foot: Secondary | ICD-10-CM

## 2024-07-04 MED ORDER — DOXYCYCLINE HYCLATE 100 MG PO CAPS
100.0000 mg | ORAL_CAPSULE | Freq: Two times a day (BID) | ORAL | 0 refills | Status: DC
Start: 2024-07-04 — End: 2024-09-05

## 2024-07-04 NOTE — ED Triage Notes (Signed)
 Pt reports Sunday felt something bit her right foot. Pt has swelling and pain since. Reports bruising on lateral side of foot. Denies injury. Pt reports put ice Sunday and Monday. Rubbed foot with tylenol  pain cream.

## 2024-07-04 NOTE — ED Provider Notes (Signed)
 [facility]    CSN: [id number] Arrival date & time: 07/04/24  1418      History   Chief Complaint Chief Complaint  Patient presents with   Foot Pain    HPI Mary Chase is a 89 y.o. female.   Patient presents today for evaluation of right foot pain and swelling that started 2 days ago.  She reports that she thinks she felt something bite her foot but did not see anything.  She has had swelling since that time.  She does have some pain with weightbearing but is not severe.  She denies any other injury.  She tried using ice and has tried a Tylenol  topical cream without resolution.  She has not had any fever.  She denies any numbness or tingling.  The history is provided by the patient.  Foot Pain Pertinent negatives include no abdominal pain and no shortness of breath.    Past Medical History:  Diagnosis Date   Age related osteoporosis    Allergic rhinitis    Aortic stenosis    Mild by echo 05/2023   Arthritis    Ascending aorta dilatation (HCC)    2D echo 06/10/2023 with ascending aorta measurement 40 mm   Asthma    Atherosclerosis of native artery of both lower extremities with intermittent claudication (HCC)    CAD (coronary artery disease), native coronary artery    coronary Ca score of 452 with <25% stenosis of prox to mid LAD and 25-49% prox RCA by coronary CTA 7/24   Cataract    Chronic kidney disease, stage 3a (HCC)    COPD (chronic obstructive pulmonary disease) (HCC)    Degenerative joint disease (DJD) of hip    Dyspnea    with exertion   GERD (gastroesophageal reflux disease)    Headache    Hypercholesterolemia    Hypertension    Hypertensive retinopathy    Mitral regurgitation    Moderate to severe by echo 05/2023   Osteoarthritis of hip    Pneumonia    Pre-diabetes    A1c within normal limits last check   Pulmonary hypertension (HCC)    Mild to moderate with PASP 44 mmHg by echo 6-24   PVD (peripheral vascular disease) (HCC)    Sickle  cell anemia (HCC)    trait   Sickle cell trait (HCC)    Tricuspid regurgitation    Moderate by echo 06/10/2023    Patient Active Problem List   Diagnosis Date Noted   Acute asthma exacerbation 04/22/2024   Obesity, class 1 04/22/2024   Seasonal and perennial allergic rhinitis 01/06/2024   Polyp of nasal cavity 01/06/2024   Moderate persistent asthma with acute exacerbation 07/05/2023   CKD stage 3a, GFR 45-59 ml/min (HCC) 07/05/2023   CAD (coronary artery disease), native coronary artery 06/30/2023   Edema of both ankles 06/22/2023   Ascending aorta dilatation (HCC) 06/04/2023   Pulmonary hypertension (HCC) 06/04/2023   Mitral regurgitation 06/04/2023   Tricuspid regurgitation 06/04/2023   Acute on chronic diastolic CHF (congestive heart failure) (HCC) 06/04/2023   Asthma, chronic, unspecified asthma severity, with acute exacerbation 05/03/2023   HTN (hypertension) 05/03/2023   Acute respiratory failure with hypoxia (HCC) 05/03/2023   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   Osteoarthritis of right hip 09/11/2020   H/O allergic rhinitis 08/31/2015    Past Surgical History:  Procedure Laterality Date   ANKLE SURGERY Right 2002   pin   BREAST BIOPSY Right 11/01/2013   Procedure: RIGHT  BREAST MASS EXCISION;  Surgeon: Donnice Bury, MD;  Location: [facility];  Service: General;  Laterality: Right;   CATARACT EXTRACTION     ELBOW SURGERY Right    pin   MULTIPLE TOOTH EXTRACTIONS     TEE WITHOUT CARDIOVERSION N/A 08/03/2023   Procedure: TRANSESOPHAGEAL ECHOCARDIOGRAM;  Surgeon: Barbaraann Darryle Ned, MD;  Location: [facility];  Service: Cardiovascular;  Laterality: N/A;   TOTAL HIP ARTHROPLASTY Right 09/16/2020   Procedure: RIGHT TOTAL HIP ARTHROPLASTY ANTERIOR APPROACH;  Surgeon: Liam Lerner, MD;  Location: [facility]S;  Service: Orthopedics;  Laterality: Right;   TUBAL LIGATION      OB History   No obstetric history on file.      Home Medications    Prior to  Admission medications   Medication Sig Start Date End Date Taking? Authorizing Provider  doxycycline  (VIBRAMYCIN ) 100 MG capsule Take 1 capsule (100 mg total) by mouth 2 (two) times daily. 07/04/24  Yes Billy Asberry FALCON, PA-C  acetaminophen  (TYLENOL ) 500 MG tablet Take 500 mg by mouth in the morning and at bedtime.    [provider]  albuterol  (PROVENTIL ) (2.5 MG/3ML) 0.083% nebulizer solution TAKE 3 ML (2.5 MG TOTAL) BY NEBULIZATION EVERY 4 HOURS AS NEEDED FOR WHEEZING OR SHORTNESS OF BREATH 02/14/24   Iva Marty Saltness, MD  albuterol  (VENTOLIN  HFA) 108 (90 Base) MCG/ACT inhaler INHALE 4 PUFFS INTO THE LUNGS EVERY 4 (FOUR) HOURS AS NEEDED FOR WHEEZING OR SHORTNESS OF BREATH. 06/27/24   Iva Marty Saltness, MD  amLODipine  (NORVASC ) 10 MG tablet Take 10 mg by mouth daily. 11/23/22   [provider]  apixaban  (ELIQUIS ) 5 MG TABS tablet Take 1 tablet (5 mg total) by mouth 2 (two) times daily. 06/29/24   Mannam, Praveen, MD  aspirin  EC 81 MG tablet Take 1 tablet (81 mg total) by mouth 2 (two) times daily. 09/16/20   Orlando Camellia POUR, PA-C  budesonide  (PULMICORT ) 0.5 MG/2ML nebulizer solution TAKE 2 MLS (0.5 MG TOTAL) BY NEBULIZATION IN THE MORNING AND AT BEDTIME. 06/14/24 07/14/24  Iva Marty Saltness, MD  Cholecalciferol (VITAMIN D3) 25 MCG (1000 UT) CAPS Take 1,000 Units by mouth daily in the afternoon.    [provider]  Cyanocobalamin (B-12) 5000 MCG CAPS Take 5,000 mcg by mouth daily.    [provider]  empagliflozin  (JARDIANCE ) 10 MG TABS tablet Take 1 tablet (10 mg total) by mouth daily. 05/31/24   Shlomo Wilbert SAUNDERS, MD  fluticasone  (FLONASE ) 50 MCG/ACT nasal spray PLACE 1 SPRAY INTO BOTH NOSTRILS DAILY AS NEEDED FOR ALLERGIES OR RHINITIS. 06/27/24   Iva Marty Saltness, MD  Fluticasone -Umeclidin-Vilant (TRELEGY ELLIPTA ) 200-62.5-25 MCG/ACT AEPB Inhale 1 puff into the lungs daily. Patient not taking: Reported on 06/06/2024 03/21/24   Iva Marty Saltness, MD   formoterol  (PERFOROMIST ) 20 MCG/2ML nebulizer solution Take 2 mLs (20 mcg total) by nebulization 2 (two) times daily. 06/28/24   Iva Marty Saltness, MD  furosemide  (LASIX ) 20 MG tablet TAKE 1 TABLET BY MOUTH EVERY DAY 06/29/24   Shlomo Wilbert SAUNDERS, MD  losartan  (COZAAR ) 50 MG tablet Take 1 tablet (50 mg total) by mouth daily. 05/11/23   Elgergawy, Brayton RAMAN, MD  montelukast  (SINGULAIR ) 10 MG tablet Take 1 tablet (10 mg total) by mouth daily. 09/10/22   Iva Marty Saltness, MD  rosuvastatin  (CRESTOR ) 5 MG tablet TAKE 1 TABLET BY MOUTH EVERY OTHER DAY 06/29/24   Shlomo Wilbert SAUNDERS, MD  spironolactone  (ALDACTONE ) 25 MG tablet Take 1/2 tablet (12.5 mg total)  by mouth daily. 05/31/24   Shlomo Wilbert SAUNDERS, MD    Family History No family history on file.  Social History Social History   Tobacco Use   Smoking status: Never   Smokeless tobacco: Never   Tobacco comments:    occ wine  Vaping Use   Vaping status: Never Used  Substance Use Topics   Alcohol use: Yes    Comment: occ   Drug use: No     Allergies   Penicillins, Alendronate sodium, Cephalexin, Sulfa antibiotics, Sulfacetamide sodium, and Other   Review of Systems Review of Systems  Constitutional:  Negative for chills and fever.  Eyes:  Negative for discharge and redness.  Respiratory:  Negative for shortness of breath.   Gastrointestinal:  Negative for abdominal pain, nausea and vomiting.  Skin:  Positive for color change. Negative for wound.  Neurological:  Negative for numbness.     Physical Exam Triage Vital Signs ED Triage Vitals  Encounter Vitals Group     BP 07/04/24 1439 128/71     Girls Systolic BP Percentile --      Girls Diastolic BP Percentile --      Boys Systolic BP Percentile --      Boys Diastolic BP Percentile --      Pulse Rate 07/04/24 1439 86     Resp 07/04/24 1439 18     Temp 07/04/24 1439 98.2 F (36.8 C)     Temp Source 07/04/24 1439 Oral     SpO2 07/04/24 1439 94 %     Weight --      Height --       Head Circumference --      Peak Flow --      Pain Score 07/04/24 1437 6     Pain Loc --      Pain Education --      Exclude from Growth Chart --    No data found.  Updated Vital Signs BP 128/71 (BP Location: Right Arm)   Pulse 86   Temp 98.2 F (36.8 C) (Oral)   Resp 18   SpO2 94%   Visual Acuity Right Eye Distance:   Left Eye Distance:   Bilateral Distance:    Right Eye Near:   Left Eye Near:    Bilateral Near:     Physical Exam Vitals and nursing note reviewed.  Constitutional:      General: She is not in acute distress.    Appearance: Normal appearance. She is not ill-appearing.  HENT:     Head: Normocephalic and atraumatic.  Eyes:     Conjunctiva/sclera: Conjunctivae normal.  Cardiovascular:     Rate and Rhythm: Normal rate.  Pulmonary:     Effort: Pulmonary effort is normal. No respiratory distress.  Musculoskeletal:     Comments: Normal range of motion of right ankle and toes.  See photo for swelling and bruising appreciated to the lateral right foot  Skin:    Capillary Refill: Normal cap refill to right toes Neurological:     Mental Status: She is alert.     Comments: Gross sensation intact to right toes distally  Psychiatric:        Mood and Affect: Mood normal.        Behavior: Behavior normal.        Thought Content: Thought content normal.      UC Treatments / Results  Labs (all labs ordered are listed, but only abnormal results are displayed) Labs Reviewed - No  data to display  EKG   Radiology DG Foot Complete Right Result Date: 07/04/2024 CLINICAL DATA:  foot pain and swelling (lateral) EXAM: RIGHT FOOT COMPLETE - 3+ VIEW COMPARISON:  None Available. FINDINGS: No acute fracture or dislocation. Cortical lag screws transfixing the medial malleolus. Hallux valgus deformity. A couple of possible bony erosions along the first metatarsal head. Soft tissue swelling about the foot. No radiopaque foreign body. IMPRESSION: 1. Moderate soft  tissue swelling about the foot. No radiographic findings to suggest osteomyelitis, at this time. 2. A couple of possible bony erosions noted along the first metatarsal head, which can be seen in gout. Alternatively, these could be degenerative given the hallux valgus deformity. Electronically Signed   By: Rogelia Yunior Jain M.D.   On: 07/04/2024 15:30    Procedures Procedures (including critical care time)  Medications Ordered in UC Medications - No data to display  Initial Impression / Assessment and Plan / UC Course  I have reviewed the triage vital signs and the nursing notes.  Pertinent labs & imaging results that were available during my care of the patient were reviewed by me and considered in my medical decision making (see chart for details).    X-ray ordered at patient's request without fracture noted.  Suspect likely cellulitis and will treat with doxycycline .  Advised follow-up if no gradual improvement or with any worsening symptoms.  Patient expressed understanding.  Final Clinical Impressions(s) / UC Diagnoses   Final diagnoses:  Right foot pain  Cellulitis of right foot   Discharge Instructions   None    ED Prescriptions     Medication Sig Dispense Auth. Provider   doxycycline  (VIBRAMYCIN ) 100 MG capsule Take 1 capsule (100 mg total) by mouth 2 (two) times daily. 20 capsule Billy Asberry FALCON, PA-C      PDMP not reviewed this encounter.   Billy Asberry FALCON, PA-C 07/04/24 1546

## 2024-07-05 DIAGNOSIS — N179 Acute kidney failure, unspecified: Secondary | ICD-10-CM | POA: Diagnosis not present

## 2024-07-05 DIAGNOSIS — I080 Rheumatic disorders of both mitral and aortic valves: Secondary | ICD-10-CM | POA: Diagnosis not present

## 2024-07-05 DIAGNOSIS — Z7982 Long term (current) use of aspirin: Secondary | ICD-10-CM | POA: Diagnosis not present

## 2024-07-05 DIAGNOSIS — H35039 Hypertensive retinopathy, unspecified eye: Secondary | ICD-10-CM | POA: Diagnosis not present

## 2024-07-05 DIAGNOSIS — K219 Gastro-esophageal reflux disease without esophagitis: Secondary | ICD-10-CM | POA: Diagnosis not present

## 2024-07-05 DIAGNOSIS — I130 Hypertensive heart and chronic kidney disease with heart failure and stage 1 through stage 4 chronic kidney disease, or unspecified chronic kidney disease: Secondary | ICD-10-CM | POA: Diagnosis not present

## 2024-07-05 DIAGNOSIS — I2510 Atherosclerotic heart disease of native coronary artery without angina pectoris: Secondary | ICD-10-CM | POA: Diagnosis not present

## 2024-07-05 DIAGNOSIS — E876 Hypokalemia: Secondary | ICD-10-CM | POA: Diagnosis not present

## 2024-07-05 DIAGNOSIS — J441 Chronic obstructive pulmonary disease with (acute) exacerbation: Secondary | ICD-10-CM | POA: Diagnosis not present

## 2024-07-05 DIAGNOSIS — E7800 Pure hypercholesterolemia, unspecified: Secondary | ICD-10-CM | POA: Diagnosis not present

## 2024-07-05 DIAGNOSIS — I70213 Atherosclerosis of native arteries of extremities with intermittent claudication, bilateral legs: Secondary | ICD-10-CM | POA: Diagnosis not present

## 2024-07-05 DIAGNOSIS — N1831 Chronic kidney disease, stage 3a: Secondary | ICD-10-CM | POA: Diagnosis not present

## 2024-07-05 DIAGNOSIS — Z7951 Long term (current) use of inhaled steroids: Secondary | ICD-10-CM | POA: Diagnosis not present

## 2024-07-05 DIAGNOSIS — Z7984 Long term (current) use of oral hypoglycemic drugs: Secondary | ICD-10-CM | POA: Diagnosis not present

## 2024-07-05 DIAGNOSIS — M810 Age-related osteoporosis without current pathological fracture: Secondary | ICD-10-CM | POA: Diagnosis not present

## 2024-07-05 DIAGNOSIS — R7303 Prediabetes: Secondary | ICD-10-CM | POA: Diagnosis not present

## 2024-07-05 DIAGNOSIS — Z96641 Presence of right artificial hip joint: Secondary | ICD-10-CM | POA: Diagnosis not present

## 2024-07-05 DIAGNOSIS — I272 Pulmonary hypertension, unspecified: Secondary | ICD-10-CM | POA: Diagnosis not present

## 2024-07-05 DIAGNOSIS — I5033 Acute on chronic diastolic (congestive) heart failure: Secondary | ICD-10-CM | POA: Diagnosis not present

## 2024-07-17 ENCOUNTER — Encounter: Payer: Self-pay | Admitting: Pulmonary Disease

## 2024-07-17 ENCOUNTER — Ambulatory Visit: Admitting: Pulmonary Disease

## 2024-07-17 VITALS — BP 114/74 | HR 84 | Ht 60.0 in | Wt 163.8 lb

## 2024-07-17 DIAGNOSIS — G4730 Sleep apnea, unspecified: Secondary | ICD-10-CM | POA: Diagnosis not present

## 2024-07-17 DIAGNOSIS — I272 Pulmonary hypertension, unspecified: Secondary | ICD-10-CM | POA: Diagnosis not present

## 2024-07-17 DIAGNOSIS — J4550 Severe persistent asthma, uncomplicated: Secondary | ICD-10-CM

## 2024-07-17 DIAGNOSIS — I749 Embolism and thrombosis of unspecified artery: Secondary | ICD-10-CM

## 2024-07-17 NOTE — Patient Instructions (Addendum)
 VISIT SUMMARY:  Today, we discussed several health concerns, including your swollen and discolored foot, breathing difficulties, gastrointestinal symptoms, emotional lability, arthritis, and sleep disturbances. We reviewed your current medications and made some recommendations to help manage your symptoms.  YOUR PLAN:  -CHRONIC PULMONARY EMBOLISM: Chronic pulmonary embolism means you have long-standing blood clots in your lungs, which can cause breathing difficulties. Continue taking Eliquis  to prevent new clots and help dissolve existing ones.  Will also order a sleep study  -PULMONARY HYPERTENSION: Pulmonary hypertension is high blood pressure in the lungs, often caused by chronic blood clots. If your condition worsens, we may consider a right heart catheterization to assess the pressure in your lungs.    INSTRUCTIONS:  Please continue taking your medications as prescribed. If your foot does not improve, schedule an appointment with a foot doctor. Monitor your nausea and let us  know if it persists. We will also follow up on scheduling your sleep study.

## 2024-07-17 NOTE — Progress Notes (Signed)
 Mary Chase    [id number]    1935-06-08  Primary Care Physician:Varadarajan, Valery, MD  Referring Physician: Elliot Valery, MD [address] [place],  [place] [address]  Chief complaint: Follow up for asthma  HPI: 89 y.o. who  has a past medical history of Age related osteoporosis, Allergic rhinitis, Aortic stenosis, Arthritis, Ascending aorta dilatation (HCC), Asthma, Atherosclerosis of native artery of both lower extremities with intermittent claudication (HCC), CAD (coronary artery disease), native coronary artery, Cataract, Chronic kidney disease, stage 3a (HCC), COPD (chronic obstructive pulmonary disease) (HCC), Degenerative joint disease (DJD) of hip, Dyspnea, GERD (gastroesophageal reflux disease), Headache, Hypercholesterolemia, Hypertension, Hypertensive retinopathy, Mitral regurgitation, Osteoarthritis of hip, Pneumonia, Pre-diabetes, Pulmonary hypertension (HCC), PVD (peripheral vascular disease) (HCC), Sickle cell anemia (HCC), Sickle cell trait (HCC), and Tricuspid regurgitation.   She has history of allergic asthma and follows with Dr. Iva with positive RAST panel and significantly elevated IgE.  Maintained on Wixela and Singulair .  Hospitalized in May 2024 with asthma exacerbation secondary to parainfluenza virus.  At that time lisinopril  was held and she was started on losartan  to help with the cough.  She did not feel well postdischarge and never recovered back to baseline and hospitalized overnight on 7/15 for dyspnea.  Her virus panel at this time were negative.  She was treated with IV steroids, empiric doxycycline  with improvement in symptoms  She was hospitalized in early May 2025 for exacerbations of asthma, heart failure, and chronic kidney disease. She also visited the emergency room on June 3rd due to a recurrence of symptoms after seeing her allergy doctor earlier that day and was discharged the next morning. A CT scan  from her May hospitalization showed bronchial inflammation and possible consolidation.   Interim History Discussed the use of AI scribe software for clinical note transcription with the patient, who gave verbal consent to proceed.  History of Present Illness  Lower extremity edema and discoloration - Swelling and discoloration of the foot since July 02, 2024, following a possible insect bite - X-ray at urgent care on July 04, 2024, showed no fractures - Swelling worsened after prolonged standing during a family reunion from July 18 to July 09, 2024 - Progressive discoloration despite icing and applying heat  Pulmonary hypertension, chronic thromboembolism - Chronic thromboembolism diagnosis VQ scan for workup of pulmonary hypertension - Currently on Eliquis  for anticoagulation - Sleep study has been ordered but not completed yet  Asthma, dyspnea and respiratory symptoms -Off Trelegy inhaler and uses nebulizers with budesonide  and formoterol , and takes montelukast .  Managed by Dr. Iva - Dupixent has been discussed with her but she does not want to start yet.  Relevant Pulmonary history: Pets: No pets Occupation: Retired [employer] operational officer Exposures: No mold, hot tub, Jacuzzi.  No feather pillows or comforters Smoking history: Never smoker Travel history: No significant travel history Relevant family history: No family history of lung disease  Outpatient Encounter Medications as of 07/17/2024  Medication Sig   acetaminophen  (TYLENOL ) 500 MG tablet Take 500 mg by mouth in the morning and at bedtime.   albuterol  (PROVENTIL ) (2.5 MG/3ML) 0.083% nebulizer solution TAKE 3 ML (2.5 MG TOTAL) BY NEBULIZATION EVERY 4 HOURS AS NEEDED FOR WHEEZING OR SHORTNESS OF BREATH   albuterol  (VENTOLIN  HFA) 108 (90 Base) MCG/ACT inhaler INHALE 4 PUFFS INTO THE LUNGS EVERY 4 (FOUR) HOURS AS NEEDED FOR WHEEZING OR SHORTNESS OF BREATH.   amLODipine  (NORVASC ) 10 MG tablet Take 10 mg  by mouth daily.   apixaban   (ELIQUIS ) 5 MG TABS tablet Take 1 tablet (5 mg total) by mouth 2 (two) times daily.   aspirin  EC 81 MG tablet Take 1 tablet (81 mg total) by mouth 2 (two) times daily.   budesonide  (PULMICORT ) 0.5 MG/2ML nebulizer solution TAKE 2 MLS (0.5 MG TOTAL) BY NEBULIZATION IN THE MORNING AND AT BEDTIME.   Cholecalciferol (VITAMIN D3) 25 MCG (1000 UT) CAPS Take 1,000 Units by mouth daily in the afternoon.   Cyanocobalamin (B-12) 5000 MCG CAPS Take 5,000 mcg by mouth daily.   doxycycline  (VIBRAMYCIN ) 100 MG capsule Take 1 capsule (100 mg total) by mouth 2 (two) times daily.   empagliflozin  (JARDIANCE ) 10 MG TABS tablet Take 1 tablet (10 mg total) by mouth daily.   fluticasone  (FLONASE ) 50 MCG/ACT nasal spray PLACE 1 SPRAY INTO BOTH NOSTRILS DAILY AS NEEDED FOR ALLERGIES OR RHINITIS.   formoterol  (PERFOROMIST ) 20 MCG/2ML nebulizer solution Take 2 mLs (20 mcg total) by nebulization 2 (two) times daily.   furosemide  (LASIX ) 20 MG tablet TAKE 1 TABLET BY MOUTH EVERY DAY   losartan  (COZAAR ) 50 MG tablet Take 1 tablet (50 mg total) by mouth daily.   montelukast  (SINGULAIR ) 10 MG tablet Take 1 tablet (10 mg total) by mouth daily.   rosuvastatin  (CRESTOR ) 5 MG tablet TAKE 1 TABLET BY MOUTH EVERY OTHER DAY   spironolactone  (ALDACTONE ) 25 MG tablet Take 1/2 tablet (12.5 mg total) by mouth daily.   Fluticasone -Umeclidin-Vilant (TRELEGY ELLIPTA ) 200-62.5-25 MCG/ACT AEPB Inhale 1 puff into the lungs daily. (Patient not taking: Reported on 06/06/2024)   No facility-administered encounter medications on file as of 07/17/2024.   Physical Exam: Blood pressure 135/81, pulse 90, height 4' 11 (1.499 m), weight 167 lb 3.2 oz (75.8 kg), SpO2 98%. Gen:      No acute distress HEENT:  EOMI, sclera anicteric Neck:     No masses; no thyromegaly Lungs:    Clear to auscultation bilaterally; normal respiratory effort CV:         Regular rate and rhythm; no murmurs Abd:      + bowel sounds; soft, non-tender; no palpable masses, no  distension Ext:    No edema; adequate peripheral perfusion Neuro: alert and oriented x 3 Psych: normal mood and affect   Data Reviewed: Imaging: CT abdomen pelvis 08/28/2022-mild atelectasis at the base.  CT coronaries 06/29/2023- bibasilar linear atelectasis/scarring.  CTA 04/22/2024-no pulmonary embolism, cardiomegaly with small pericardial effusion, ill-defined airspace disease in the anterior segment of the right upper lobe, consolidation in the lingula base.  VQ scan 06/16/2024-multiple filling defects suggestive of chronic pulmonary embolism I had reviewed the images personally  PFTs: Spirometry 05/28/2023 FVC 2.52 [147%], FEV1 0.69 [53%], F/F 0.27 Moderate obstructive airway disease  10/12/2023 FVC 1.27 [66%], FEV1 0.78 [56%], F/F61, TLC 4.54 [102%], DLCO 10.56 [65%] Mild obstruction, mild diffusion defect  Labs: CBC 04/14/2022 WBC 8.2, eos 6%, absolute eosinophil count 492 CBC 07/07/2023 WBC 16.8, eos 0% IgE 07/07/2023- 4778  FENO 07/07/2023- 54  Cardiac: Echocardiogram 04/24/2024-LVEF 60 to 65%, normal RV systolic function, moderate elevation of PA systolic pressure, estimated RVSP 57 Assessment & Plan Asthma Asthma exacerbation requiring recent hospitalization and emergency room visit.  She was on Trelegy but changed to nebulizers by Dr. Iva last month.  Current management includes nebulizers (Pulmicort  and Perforomist ) and Singulair . Biologic therapy with Dupixent or tezpire was discussed by Dr. Iva from allergy and immunology but patient is reluctant to start due to concerns about cost  and potential side effects. Emphasized the effectiveness of biologics in preventing hospitalizations and suggested checking insurance coverage for potential cost assistance. Discussed the alternative of using prednisone  during exacerbations, noting its side effects may be worse than those of biologics.  - Continue Pulmicort  and Perforomist  nebulizers - Continue Singulair  - Consider  biologic therapy (Dupixent or Tezspire) if asthma exacerbations are not controlled with current therapy - Consider over-the-counter Zyrtec for allergies  Abnormal CT Previous CT scan showed some consolidation and interstitial changes likely related to pneumonia Order follow-up CT in 3 months to ensure resolution  Pulmonary hypertension Pulmonary hypertension likely secondary to chronic thromboembolism, lung issues, sleep apnea, and heart valve problems. to further evaluate the condition. - Continue with Eliquis  anticoagulation - Proceed with sleep study - Consider right heart catheterization if condition worsens. but would like to hold off for now given advanced age  Aortic stenosis and mitral regurgitation Chronic kidney disease Aortic stenosis and mitral regurgitation causing backup of blood into the lungs, contributing to pulmonary hypertension and dyspnea. Explained the valve issues and their impact on pulmonary symptoms. Chronic kidney disease contributing to volume overload and potential exacerbation of pulmonary symptoms.  Gastroesophageal reflux disease (GERD) Intermittent GERD symptoms managed with over-the-counter Tums. - Continue Tums as needed  Foot injury with swelling and discoloration Foot injury with swelling and discoloration, possibly due to a sprain or minor bleeding exacerbated by blood thinner use. X-ray showed no fractures. - Ice the affected foot. - Refer to a foot doctor if no improvement.   Plan/Recommendations: Continue Pulmicort  mild performers, Singulair  Continue Eliquis  anticoagulation Home sleep study Follow-up CT scan  Lonna Coder MD  Pulmonary and Critical Care 07/17/2024, 3:25 PM  CC: Elliot Charm,*

## 2024-07-22 ENCOUNTER — Encounter: Payer: Self-pay | Admitting: Allergy

## 2024-07-22 MED ORDER — PREDNISONE 20 MG PO TABS: 20.0000 mg | ORAL_TABLET | Freq: Every day | ORAL | 0 refills | Status: AC

## 2024-07-25 DIAGNOSIS — I10 Essential (primary) hypertension: Secondary | ICD-10-CM | POA: Diagnosis not present

## 2024-07-25 DIAGNOSIS — J069 Acute upper respiratory infection, unspecified: Secondary | ICD-10-CM | POA: Diagnosis not present

## 2024-07-25 DIAGNOSIS — M810 Age-related osteoporosis without current pathological fracture: Secondary | ICD-10-CM | POA: Diagnosis not present

## 2024-07-25 DIAGNOSIS — I70213 Atherosclerosis of native arteries of extremities with intermittent claudication, bilateral legs: Secondary | ICD-10-CM | POA: Diagnosis not present

## 2024-07-26 DIAGNOSIS — I272 Pulmonary hypertension, unspecified: Secondary | ICD-10-CM | POA: Diagnosis not present

## 2024-07-26 DIAGNOSIS — Z7982 Long term (current) use of aspirin: Secondary | ICD-10-CM | POA: Diagnosis not present

## 2024-07-26 DIAGNOSIS — Z96641 Presence of right artificial hip joint: Secondary | ICD-10-CM | POA: Diagnosis not present

## 2024-07-26 DIAGNOSIS — Z7984 Long term (current) use of oral hypoglycemic drugs: Secondary | ICD-10-CM | POA: Diagnosis not present

## 2024-07-26 DIAGNOSIS — Z7951 Long term (current) use of inhaled steroids: Secondary | ICD-10-CM | POA: Diagnosis not present

## 2024-07-26 DIAGNOSIS — K219 Gastro-esophageal reflux disease without esophagitis: Secondary | ICD-10-CM | POA: Diagnosis not present

## 2024-07-26 DIAGNOSIS — E7800 Pure hypercholesterolemia, unspecified: Secondary | ICD-10-CM | POA: Diagnosis not present

## 2024-07-26 DIAGNOSIS — I080 Rheumatic disorders of both mitral and aortic valves: Secondary | ICD-10-CM | POA: Diagnosis not present

## 2024-07-26 DIAGNOSIS — E876 Hypokalemia: Secondary | ICD-10-CM | POA: Diagnosis not present

## 2024-07-26 DIAGNOSIS — I5033 Acute on chronic diastolic (congestive) heart failure: Secondary | ICD-10-CM | POA: Diagnosis not present

## 2024-07-26 DIAGNOSIS — H35039 Hypertensive retinopathy, unspecified eye: Secondary | ICD-10-CM | POA: Diagnosis not present

## 2024-07-26 DIAGNOSIS — R7303 Prediabetes: Secondary | ICD-10-CM | POA: Diagnosis not present

## 2024-07-26 DIAGNOSIS — N1831 Chronic kidney disease, stage 3a: Secondary | ICD-10-CM | POA: Diagnosis not present

## 2024-07-26 DIAGNOSIS — R059 Cough, unspecified: Secondary | ICD-10-CM | POA: Diagnosis not present

## 2024-07-26 DIAGNOSIS — J45901 Unspecified asthma with (acute) exacerbation: Secondary | ICD-10-CM | POA: Diagnosis not present

## 2024-07-26 DIAGNOSIS — I70213 Atherosclerosis of native arteries of extremities with intermittent claudication, bilateral legs: Secondary | ICD-10-CM | POA: Diagnosis not present

## 2024-07-26 DIAGNOSIS — I130 Hypertensive heart and chronic kidney disease with heart failure and stage 1 through stage 4 chronic kidney disease, or unspecified chronic kidney disease: Secondary | ICD-10-CM | POA: Diagnosis not present

## 2024-07-26 DIAGNOSIS — I2510 Atherosclerotic heart disease of native coronary artery without angina pectoris: Secondary | ICD-10-CM | POA: Diagnosis not present

## 2024-07-26 DIAGNOSIS — J441 Chronic obstructive pulmonary disease with (acute) exacerbation: Secondary | ICD-10-CM | POA: Diagnosis not present

## 2024-07-26 DIAGNOSIS — N179 Acute kidney failure, unspecified: Secondary | ICD-10-CM | POA: Diagnosis not present

## 2024-07-26 DIAGNOSIS — M810 Age-related osteoporosis without current pathological fracture: Secondary | ICD-10-CM | POA: Diagnosis not present

## 2024-07-29 DIAGNOSIS — G4730 Sleep apnea, unspecified: Secondary | ICD-10-CM | POA: Diagnosis not present

## 2024-08-02 ENCOUNTER — Other Ambulatory Visit: Payer: Self-pay | Admitting: Cardiology

## 2024-08-02 NOTE — Telephone Encounter (Signed)
 Prescription refill request for Eliquis  received. Indication:cad Last office visit:5/25 Scr:1.09  5/25 Age: 89 Weight:74.3  kg  Prescription refilled

## 2024-08-08 DIAGNOSIS — M810 Age-related osteoporosis without current pathological fracture: Secondary | ICD-10-CM | POA: Diagnosis not present

## 2024-08-08 DIAGNOSIS — L0291 Cutaneous abscess, unspecified: Secondary | ICD-10-CM | POA: Diagnosis not present

## 2024-08-08 DIAGNOSIS — I10 Essential (primary) hypertension: Secondary | ICD-10-CM | POA: Diagnosis not present

## 2024-08-08 DIAGNOSIS — I70213 Atherosclerosis of native arteries of extremities with intermittent claudication, bilateral legs: Secondary | ICD-10-CM | POA: Diagnosis not present

## 2024-08-08 DIAGNOSIS — I2721 Secondary pulmonary arterial hypertension: Secondary | ICD-10-CM | POA: Diagnosis not present

## 2024-08-08 DIAGNOSIS — I5032 Chronic diastolic (congestive) heart failure: Secondary | ICD-10-CM | POA: Diagnosis not present

## 2024-08-08 DIAGNOSIS — N1831 Chronic kidney disease, stage 3a: Secondary | ICD-10-CM | POA: Diagnosis not present

## 2024-08-10 DIAGNOSIS — E876 Hypokalemia: Secondary | ICD-10-CM | POA: Diagnosis not present

## 2024-08-10 DIAGNOSIS — I5033 Acute on chronic diastolic (congestive) heart failure: Secondary | ICD-10-CM | POA: Diagnosis not present

## 2024-08-10 DIAGNOSIS — K219 Gastro-esophageal reflux disease without esophagitis: Secondary | ICD-10-CM | POA: Diagnosis not present

## 2024-08-10 DIAGNOSIS — M810 Age-related osteoporosis without current pathological fracture: Secondary | ICD-10-CM | POA: Diagnosis not present

## 2024-08-10 DIAGNOSIS — I272 Pulmonary hypertension, unspecified: Secondary | ICD-10-CM | POA: Diagnosis not present

## 2024-08-10 DIAGNOSIS — I080 Rheumatic disorders of both mitral and aortic valves: Secondary | ICD-10-CM | POA: Diagnosis not present

## 2024-08-10 DIAGNOSIS — I70213 Atherosclerosis of native arteries of extremities with intermittent claudication, bilateral legs: Secondary | ICD-10-CM | POA: Diagnosis not present

## 2024-08-10 DIAGNOSIS — H35039 Hypertensive retinopathy, unspecified eye: Secondary | ICD-10-CM | POA: Diagnosis not present

## 2024-08-10 DIAGNOSIS — R7303 Prediabetes: Secondary | ICD-10-CM | POA: Diagnosis not present

## 2024-08-10 DIAGNOSIS — I130 Hypertensive heart and chronic kidney disease with heart failure and stage 1 through stage 4 chronic kidney disease, or unspecified chronic kidney disease: Secondary | ICD-10-CM | POA: Diagnosis not present

## 2024-08-10 DIAGNOSIS — Z7951 Long term (current) use of inhaled steroids: Secondary | ICD-10-CM | POA: Diagnosis not present

## 2024-08-10 DIAGNOSIS — N1831 Chronic kidney disease, stage 3a: Secondary | ICD-10-CM | POA: Diagnosis not present

## 2024-08-10 DIAGNOSIS — Z7984 Long term (current) use of oral hypoglycemic drugs: Secondary | ICD-10-CM | POA: Diagnosis not present

## 2024-08-10 DIAGNOSIS — Z96641 Presence of right artificial hip joint: Secondary | ICD-10-CM | POA: Diagnosis not present

## 2024-08-10 DIAGNOSIS — Z7982 Long term (current) use of aspirin: Secondary | ICD-10-CM | POA: Diagnosis not present

## 2024-08-10 DIAGNOSIS — E7800 Pure hypercholesterolemia, unspecified: Secondary | ICD-10-CM | POA: Diagnosis not present

## 2024-08-10 DIAGNOSIS — N179 Acute kidney failure, unspecified: Secondary | ICD-10-CM | POA: Diagnosis not present

## 2024-08-10 DIAGNOSIS — I2510 Atherosclerotic heart disease of native coronary artery without angina pectoris: Secondary | ICD-10-CM | POA: Diagnosis not present

## 2024-08-10 DIAGNOSIS — J441 Chronic obstructive pulmonary disease with (acute) exacerbation: Secondary | ICD-10-CM | POA: Diagnosis not present

## 2024-08-14 ENCOUNTER — Other Ambulatory Visit: Payer: Self-pay | Admitting: Internal Medicine

## 2024-08-14 DIAGNOSIS — M7989 Other specified soft tissue disorders: Secondary | ICD-10-CM

## 2024-08-15 ENCOUNTER — Other Ambulatory Visit: Payer: Self-pay | Admitting: Internal Medicine

## 2024-08-15 ENCOUNTER — Inpatient Hospital Stay
Admission: RE | Admit: 2024-08-15 | Discharge: 2024-08-15 | Discharge status: Home / Self Care | Source: Ambulatory Visit | Attending: Internal Medicine | Admitting: Internal Medicine

## 2024-08-15 DIAGNOSIS — R2243 Localized swelling, mass and lump, lower limb, bilateral: Secondary | ICD-10-CM | POA: Diagnosis not present

## 2024-08-15 DIAGNOSIS — M7989 Other specified soft tissue disorders: Secondary | ICD-10-CM

## 2024-08-22 ENCOUNTER — Telehealth: Payer: Self-pay

## 2024-08-22 NOTE — Telephone Encounter (Signed)
 Called to know reason for visit tomorrow with Mannam MD. Left voicemail to call back

## 2024-08-23 ENCOUNTER — Ambulatory Visit
Admission: RE | Admit: 2024-08-23 | Discharge: 2024-08-23 | Disposition: A | Discharge status: Home / Self Care | Source: Ambulatory Visit | Attending: Pulmonary Disease

## 2024-08-23 ENCOUNTER — Encounter: Payer: Self-pay | Admitting: Pulmonary Disease

## 2024-08-23 ENCOUNTER — Encounter: Payer: Self-pay | Admitting: Internal Medicine

## 2024-08-23 ENCOUNTER — Other Ambulatory Visit (HOSPITAL_COMMUNITY): Payer: Self-pay

## 2024-08-23 ENCOUNTER — Ambulatory Visit: Admitting: Pulmonary Disease

## 2024-08-23 VITALS — BP 140/84 | HR 86 | Temp 98.7°F | Ht 60.0 in | Wt 160.0 lb

## 2024-08-23 DIAGNOSIS — J4550 Severe persistent asthma, uncomplicated: Secondary | ICD-10-CM | POA: Diagnosis not present

## 2024-08-23 DIAGNOSIS — R918 Other nonspecific abnormal finding of lung field: Secondary | ICD-10-CM | POA: Diagnosis not present

## 2024-08-23 DIAGNOSIS — I272 Pulmonary hypertension, unspecified: Secondary | ICD-10-CM

## 2024-08-23 DIAGNOSIS — I749 Embolism and thrombosis of unspecified artery: Secondary | ICD-10-CM | POA: Diagnosis not present

## 2024-08-23 NOTE — Progress Notes (Signed)
 Mary Chase    [id number]    [birth year]  Primary Care Physician:Varadarajan, Valery, MD  Referring Physician: Elliot Valery, MD [address] [place],  [place] [address]  Chief complaint: Follow up for asthma  HPI: 89 y.o. who  has a past medical history of Age related osteoporosis, Allergic rhinitis, Aortic stenosis, Arthritis, Ascending aorta dilatation (HCC), Asthma, Atherosclerosis of native artery of both lower extremities with intermittent claudication (HCC), CAD (coronary artery disease), native coronary artery, Cataract, Chronic kidney disease, stage 3a (HCC), COPD (chronic obstructive pulmonary disease) (HCC), Degenerative joint disease (DJD) of hip, Dyspnea, GERD (gastroesophageal reflux disease), Headache, Hypercholesterolemia, Hypertension, Hypertensive retinopathy, Mitral regurgitation, Osteoarthritis of hip, Pneumonia, Pre-diabetes, Pulmonary hypertension (HCC), PVD (peripheral vascular disease) (HCC), Sickle cell anemia (HCC), Sickle cell trait (HCC), and Tricuspid regurgitation.   She has history of allergic asthma and follows with Dr. Iva with positive RAST panel and significantly elevated IgE.  Maintained on Wixela and Singulair .  Hospitalized in May 2024 with asthma exacerbation secondary to parainfluenza virus.  At that time lisinopril  was held and she was started on losartan  to help with the cough.  She did not feel well postdischarge and never recovered back to baseline and hospitalized overnight on 7/15 for dyspnea.  Her virus panel at this time were negative.  She was treated with IV steroids, empiric doxycycline  with improvement in symptoms  She was hospitalized in early May 2025 for exacerbations of asthma, heart failure, and chronic kidney disease. She also visited the emergency room on June 3rd due to a recurrence of symptoms after seeing her allergy doctor earlier that day and was discharged the next morning. A CT scan  from her May hospitalization showed bronchial inflammation and possible consolidation.   VQ scan in June 2025 showed chronic thromboembolism eventually started on Eliquis   Interim History Discussed the use of AI scribe software for clinical note transcription with the patient, who gave verbal consent to proceed.  History of Present Illness  TAYLORANNE LEKAS is an 89 year old female with pulmonary hypertension and chronic thromboembolism who presents for follow-up of her respiratory conditions.  Dyspnea and pulmonary hypertension - Pulmonary hypertension and chronic thromboembolism managed with Eliquis . - VQ scan earlier this year indicated chronic thromboembolism. - Shortness of breath with activity, relieved by sitting and taking deep breaths. - Currently using Perforomist  and Pulmicort  nebulizers, switched from Trelegy. - Asthma has improved, with less frequent inhaler use.  Sleep-related breathing disorder - Sleep study revealed very mild sleep apnea with an AHI of 5.6. - Oxygen saturation dropped to 82% during sleep study. - Prefers to manage sleep apnea with weight loss.  Musculoskeletal pain - Slipped a few days ago, resulting in lower back pain. - No fractures sustained. - Continues to feel sore.  Recent respiratory infection - Pneumonia in the upper part of the right lung earlier this year, now improved.  Foot infection - Treated with antibiotics for foot infection, which is slowly improving.  Hypertension and medication management - Blood pressure medication recently adjusted by primary doctor. - Previously taking losartan  and another medication, which was stopped. - Took a full dose of losartan  today due to elevated blood pressure, possibly related to pain from fall. - Continues to take Jardiance , prescribed by cardiologist.  Relevant Pulmonary history: Pets: No pets Occupation: Retired [employer] operational officer Exposures: No mold, hot tub, Jacuzzi.  No feather pillows or  comforters Smoking history: Never smoker Travel history: No significant  travel history Relevant family history: No family history of lung disease  Outpatient Encounter Medications as of 08/23/2024  Medication Sig   acetaminophen  (TYLENOL ) 500 MG tablet Take 500 mg by mouth in the morning and at bedtime.   AIRSUPRA 90-80 MCG/ACT AERO 2 puffs as needed Inhalation Six times a day; Duration: 30 days As needed   albuterol  (PROVENTIL ) (2.5 MG/3ML) 0.083% nebulizer solution TAKE 3 ML (2.5 MG TOTAL) BY NEBULIZATION EVERY 4 HOURS AS NEEDED FOR WHEEZING OR SHORTNESS OF BREATH   albuterol  (VENTOLIN  HFA) 108 (90 Base) MCG/ACT inhaler INHALE 4 PUFFS INTO THE LUNGS EVERY 4 (FOUR) HOURS AS NEEDED FOR WHEEZING OR SHORTNESS OF BREATH.   amLODipine  (NORVASC ) 10 MG tablet Take 10 mg by mouth daily.   apixaban  (ELIQUIS ) 5 MG TABS tablet TAKE 1 TABLET BY MOUTH TWICE A DAY   aspirin  EC 81 MG tablet Take 1 tablet (81 mg total) by mouth 2 (two) times daily.   budesonide  (PULMICORT ) 0.5 MG/2ML nebulizer solution TAKE 2 MLS (0.5 MG TOTAL) BY NEBULIZATION IN THE MORNING AND AT BEDTIME.   Cholecalciferol (VITAMIN D3) 25 MCG (1000 UT) CAPS Take 1,000 Units by mouth daily in the afternoon.   Cyanocobalamin (B-12) 5000 MCG CAPS Take 5,000 mcg by mouth daily.   doxycycline  (VIBRAMYCIN ) 100 MG capsule Take 1 capsule (100 mg total) by mouth 2 (two) times daily.   empagliflozin  (JARDIANCE ) 10 MG TABS tablet Take 1 tablet (10 mg total) by mouth daily.   fluticasone  (FLONASE ) 50 MCG/ACT nasal spray PLACE 1 SPRAY INTO BOTH NOSTRILS DAILY AS NEEDED FOR ALLERGIES OR RHINITIS.   formoterol  (PERFOROMIST ) 20 MCG/2ML nebulizer solution Take 2 mLs (20 mcg total) by nebulization 2 (two) times daily.   furosemide  (LASIX ) 20 MG tablet TAKE 1 TABLET BY MOUTH EVERY DAY   losartan  (COZAAR ) 50 MG tablet Take 1 tablet (50 mg total) by mouth daily.   montelukast  (SINGULAIR ) 10 MG tablet Take 1 tablet (10 mg total) by mouth daily.   rosuvastatin   (CRESTOR ) 5 MG tablet TAKE 1 TABLET BY MOUTH EVERY OTHER DAY   spironolactone  (ALDACTONE ) 25 MG tablet Take 1/2 tablet (12.5 mg total) by mouth daily.   Fluticasone -Umeclidin-Vilant (TRELEGY ELLIPTA ) 200-62.5-25 MCG/ACT AEPB Inhale 1 puff into the lungs daily. (Patient not taking: Reported on 06/06/2024)   No facility-administered encounter medications on file as of 08/23/2024.   Physical Exam: Blood pressure 135/81, pulse 90, height 4' 11 (1.499 m), weight 167 lb 3.2 oz (75.8 kg), SpO2 98%. Gen:      No acute distress HEENT:  EOMI, sclera anicteric Neck:     No masses; no thyromegaly Lungs:    Clear to auscultation bilaterally; normal respiratory effort CV:         Regular rate and rhythm; no murmurs Abd:      + bowel sounds; soft, non-tender; no palpable masses, no distension Ext:    No edema; adequate peripheral perfusion Neuro: alert and oriented x 3 Psych: normal mood and affect   Data Reviewed: Imaging: CT abdomen pelvis 08/28/2022-mild atelectasis at the base.  CT coronaries 06/29/2023- bibasilar linear atelectasis/scarring.  CTA 04/22/2024-no pulmonary embolism, cardiomegaly with small pericardial effusion, ill-defined airspace disease in the anterior segment of the right upper lobe, consolidation in the lingula base.  VQ scan 06/16/2024-multiple filling defects suggestive of chronic pulmonary embolism  CT chest 08/22/2024-airspace density in the right upper lobe appears improved. I had reviewed the images personally  PFTs: Spirometry 05/28/2023 FVC 2.52 [147%], FEV1 0.69 [53%], F/F 0.27  Moderate obstructive airway disease  10/12/2023 FVC 1.27 [66%], FEV1 0.78 [56%], F/F61, TLC 4.54 [102%], DLCO 10.56 [65%] Mild obstruction, mild diffusion defect  Labs: CBC 04/14/2022 WBC 8.2, eos 6%, absolute eosinophil count 492 CBC 07/07/2023 WBC 16.8, eos 0% IgE 07/07/2023- 4778  FENO 07/07/2023- 54  Cardiac: Echocardiogram 04/24/2024-LVEF 60 to 65%, normal RV systolic function, moderate  elevation of PA systolic pressure, estimated RVSP 57 Assessment & Plan Asthma Asthma exacerbation requiring recent hospitalization and emergency room visit.  She was on Trelegy but changed to nebulizers by Dr. Iva last month.  Current management includes nebulizers (Pulmicort  and Perforomist ) and Singulair . Biologic therapy with Dupixent or tezpire was discussed by Dr. Iva from allergy and immunology but patient is reluctant to start due to concerns about cost and potential side effects. Emphasized the effectiveness of biologics in preventing hospitalizations and suggested checking insurance coverage for potential cost assistance. Discussed the alternative of using prednisone  during exacerbations, noting its side effects may be worse than those of biologics.  - Continue Pulmicort  and Perforomist  nebulizers - Continue Singulair  - Consider biologic therapy (Dupixent or Tezspire) if asthma exacerbations are not controlled with current therapy - Consider over-the-counter Zyrtec for allergies  Abnormal CT Previous CT scan showed some consolidation and interstitial changes likely related to pneumonia Follow-up CT scan shows resolution by my review.  Final radiology read is pending  Pulmonary hypertension Chronic pulmonary thromboembolism Pulmonary hypertension likely secondary to chronic thromboembolism, lung issues, sleep apnea, and heart valve problems. to further evaluate the condition. - Continue with Eliquis  anticoagulation   Aortic stenosis and mitral regurgitation Chronic kidney disease Aortic stenosis and mitral regurgitation causing backup of blood into the lungs, contributing to pulmonary hypertension and dyspnea. Explained the valve issues and their impact on pulmonary symptoms. Chronic kidney disease contributing to volume overload and potential exacerbation of pulmonary symptoms.  Gastroesophageal reflux disease (GERD) Intermittent GERD symptoms managed with  over-the-counter Tums. - Continue Tums as needed  Mild obstructive sleep apnea Very mild obstructive sleep apnea with an AHI of 5.6 and oxygen desaturation to 82% during sleep. She prefers weight loss over CPAP for management. - Advise weight loss for management of mild sleep apnea   Plan/Recommendations: Continue Pulmicort  mild performers, Singulair  Continue Eliquis  anticoagulation  Lonna Coder MD [facility] 08/23/2024, 4:23 PM  CC: Elliot Charm,*

## 2024-08-23 NOTE — Patient Instructions (Signed)
  VISIT SUMMARY: Today, you were seen for a follow-up on your respiratory conditions, including pulmonary hypertension, chronic thromboembolism, asthma, and sleep apnea. We also discussed your recent fall and back pain, as well as your ongoing foot infection and hypertension management.  YOUR PLAN: -RECENT FALL WITH LOW BACK PAIN: You experienced a fall recently, which caused lower back pain but no fractures. The soreness is expected to improve with time. Continue to monitor your pain and use over-the-counter pain relief if needed.  -VERY MILD OBSTRUCTIVE SLEEP APNEA: You have very mild sleep apnea, which means your breathing occasionally stops and starts during sleep. You prefer to manage this with weight loss rather than using a CPAP machine. Focus on a healthy diet and regular exercise to help with weight loss.  -ASTHMA: Your asthma has improved, and you are using your inhaler less frequently. Continue using Perforomist  and Pulmicort  nebulizers as prescribed to manage your asthma symptoms.  -CHRONIC THROMBOEMBOLIC PULMONARY HYPERTENSION: This condition involves high blood pressure in the lungs due to blood clots. You are currently taking Eliquis , which is helping to manage this condition. Continue taking Eliquis  as prescribed.  -CHRONIC RIGHT UPPER LOBE LUNG CONSOLIDATION (IMPROVED) AND CHRONIC MILD BIBASILAR ATELECTASIS: Your lung condition has shown improvement, and the mild collapse of the lower parts of your lungs is not a cause for concern. No changes in treatment are needed at this time.  INSTRUCTIONS: Please continue with your current medications and follow the advice provided for each condition. If your back pain worsens or if you have any new symptoms, contact our office. Focus on weight loss to help manage your sleep apnea. Follow up with your primary doctor for blood pressure management and any other concerns.

## 2024-08-25 DIAGNOSIS — H35039 Hypertensive retinopathy, unspecified eye: Secondary | ICD-10-CM | POA: Diagnosis not present

## 2024-08-25 DIAGNOSIS — J441 Chronic obstructive pulmonary disease with (acute) exacerbation: Secondary | ICD-10-CM | POA: Diagnosis not present

## 2024-08-25 DIAGNOSIS — M810 Age-related osteoporosis without current pathological fracture: Secondary | ICD-10-CM | POA: Diagnosis not present

## 2024-08-25 DIAGNOSIS — K219 Gastro-esophageal reflux disease without esophagitis: Secondary | ICD-10-CM | POA: Diagnosis not present

## 2024-08-25 DIAGNOSIS — R7303 Prediabetes: Secondary | ICD-10-CM | POA: Diagnosis not present

## 2024-08-25 DIAGNOSIS — Z96641 Presence of right artificial hip joint: Secondary | ICD-10-CM | POA: Diagnosis not present

## 2024-08-25 DIAGNOSIS — N1831 Chronic kidney disease, stage 3a: Secondary | ICD-10-CM | POA: Diagnosis not present

## 2024-08-25 DIAGNOSIS — I130 Hypertensive heart and chronic kidney disease with heart failure and stage 1 through stage 4 chronic kidney disease, or unspecified chronic kidney disease: Secondary | ICD-10-CM | POA: Diagnosis not present

## 2024-08-25 DIAGNOSIS — I5033 Acute on chronic diastolic (congestive) heart failure: Secondary | ICD-10-CM | POA: Diagnosis not present

## 2024-08-25 DIAGNOSIS — E7800 Pure hypercholesterolemia, unspecified: Secondary | ICD-10-CM | POA: Diagnosis not present

## 2024-08-25 DIAGNOSIS — I272 Pulmonary hypertension, unspecified: Secondary | ICD-10-CM | POA: Diagnosis not present

## 2024-08-25 DIAGNOSIS — Z7982 Long term (current) use of aspirin: Secondary | ICD-10-CM | POA: Diagnosis not present

## 2024-08-25 DIAGNOSIS — N179 Acute kidney failure, unspecified: Secondary | ICD-10-CM | POA: Diagnosis not present

## 2024-08-25 DIAGNOSIS — Z7951 Long term (current) use of inhaled steroids: Secondary | ICD-10-CM | POA: Diagnosis not present

## 2024-08-25 DIAGNOSIS — I2510 Atherosclerotic heart disease of native coronary artery without angina pectoris: Secondary | ICD-10-CM | POA: Diagnosis not present

## 2024-08-25 DIAGNOSIS — I70213 Atherosclerosis of native arteries of extremities with intermittent claudication, bilateral legs: Secondary | ICD-10-CM | POA: Diagnosis not present

## 2024-08-25 DIAGNOSIS — I080 Rheumatic disorders of both mitral and aortic valves: Secondary | ICD-10-CM | POA: Diagnosis not present

## 2024-08-25 DIAGNOSIS — Z7984 Long term (current) use of oral hypoglycemic drugs: Secondary | ICD-10-CM | POA: Diagnosis not present

## 2024-08-25 DIAGNOSIS — E876 Hypokalemia: Secondary | ICD-10-CM | POA: Diagnosis not present

## 2024-08-30 ENCOUNTER — Other Ambulatory Visit: Payer: Self-pay | Admitting: Allergy & Immunology

## 2024-09-05 ENCOUNTER — Other Ambulatory Visit: Payer: Self-pay

## 2024-09-05 ENCOUNTER — Ambulatory Visit: Admitting: Allergy & Immunology

## 2024-09-05 ENCOUNTER — Encounter: Payer: Self-pay | Admitting: Allergy & Immunology

## 2024-09-05 VITALS — BP 96/62 | HR 88 | Temp 98.0°F | Resp 14 | Ht 60.0 in | Wt 160.1 lb

## 2024-09-05 DIAGNOSIS — J302 Other seasonal allergic rhinitis: Secondary | ICD-10-CM

## 2024-09-05 DIAGNOSIS — R768 Other specified abnormal immunological findings in serum: Secondary | ICD-10-CM | POA: Diagnosis not present

## 2024-09-05 DIAGNOSIS — J330 Polyp of nasal cavity: Secondary | ICD-10-CM | POA: Diagnosis not present

## 2024-09-05 DIAGNOSIS — J4489 Other specified chronic obstructive pulmonary disease: Secondary | ICD-10-CM | POA: Diagnosis not present

## 2024-09-05 DIAGNOSIS — J3089 Other allergic rhinitis: Secondary | ICD-10-CM

## 2024-09-05 NOTE — Progress Notes (Unsigned)
 FOLLOW UP  Date of Service/Encounter:  09/05/24   Assessment:   Moderate persistent asthma, uncomplicated - with three rounds of prednisone  in less than 6 months   Strongly consider Dupixent (have discussed with patient and she is worried about the side effects), seems open to [employer] (provided handout today)   Chronic rhinitis (grasses, weeds, ragweed, trees, molds, dust mites, roach, cat, dog) - s/p years of allergen immunotherapy   Elevated IgE - with normal SPEP and UPEP    Polyp of left nasal cavity - much better with the regular use of fluticasone  nasal spray  Plan/Recommendations:   Patient Instructions  1. Moderate persistent asthma, uncomplicated - Lung testing looked better today.  - I would continue with the same recommendations for your maintenance nebulizers (EVERY 12 HOURS).  - START taking: Yupelri  one vial once daily (samples provided). - This can be mixed into your nebulizer container with your other medications.  - Daily controller medication(s): Pulmicort  (budesonide ) 0.5 mg + Perforomist  (formoterol ) MIXED TOGETHER TWICE DAILY VIA NEBULIZER + Singulair  (montelukast ) 10mg  daily - Prior to physical activity: albuterol  2 puffs 10-15 minutes before physical activity. - Rescue medications: albuterol  4 puffs every 4-6 hours as needed and albuterol  nebulizer one vial every 4-6 hours as needed - Asthma control goals:  * Full participation in all desired activities (may need albuterol  before activity) * Albuterol  use two time or less a week on average (not counting use with activity) * Cough interfering with sleep two time or less a month * Oral steroids no more than once a year * No hospitalizations  2. 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  - Consider the Dupixent.  - Continue with Zyrtec (cetirizine) 10mg  daily as needed for runny nose/sneezing.  - Continue with Mucinex  as needed.  3. Return in  about 3 months (around 12/05/2024).     Please inform us  of any Emergency Department visits, hospitalizations, or changes in symptoms. Call us  before going to the ED for breathing or allergy symptoms since we might be able to fit you in for a sick visit. Feel free to contact us  anytime with any questions, problems, or concerns.  It was a pleasure to see you again today!   Websites that have reliable patient information: 1. American Academy of Asthma, Allergy, and Immunology: www.aaaai.org 2. Food Allergy Research and Education (FARE): foodallergy.org 3. Mothers of Asthmatics: http://www.asthmacommunitynetwork.org 4. American College of Allergy, Asthma, and Immunology: www.acaai.org     CLINICAL DATA:  Lower extremity swelling. Strange sensation in BILATERAL upper thighs. Hypertension.   EXAM: NONINVASIVE PHYSIOLOGIC VASCULAR STUDY OF BILATERAL LOWER EXTREMITIES   TECHNIQUE: Non-invasive vascular evaluation of both lower extremities was performed at rest, including calculation of ankle-brachial indices, multiple segmental pressure evaluation, segmental Doppler and segmental pulse volume recording.   COMPARISON:  None available.   FINDINGS: Right Lower Extremity   Resting ABI:  1.03   Resting TBI: 0.97   Segmental Pressures: Normal segmental pressures, no significant (20 mmHg) pressure gradient between adjacent segments.   Great toe pressure: 150 mmHg   Arterial Waveforms: Normal multi-phasic arterial waveforms.   PVRs: Normal PVRs with maintained waveform amplitude, augmentation and quality.   Left Lower Extremity:   Resting ABI: 1.22   Resting TBI: 0.92   Segmental Pressures: Normal segmental pressures, no significant (20 mmHg) pressure gradient between adjacent segments.   Great toe pressure: 142 mmHg   Arterial Waveforms: Normal multi-phasic arterial waveforms.  PVRs: Normal PVRs with maintained waveform amplitude, augmentation and quality.   Other:  Symmetric upper extremity pressures.   Ankle Brachial index   > 1.4 Non diagnostic secondary to incompressible vessel calcifications   1.0-1.4       Normal   0.9-0.99     Borderline PAD   0.8-0.89     Mild PAD   0.5-0.79     Moderate PAD   < 0.5          Severe PAD   Toe Brachial Index   Normal     >0.65   Moderate  0.53-0.64   Severe     <0.23   Toe Pressures   Absolute toe pressure >25mmHg sufficient for wound healing.   Toe pressures <27mmHg = critical limb ischemia.   IMPRESSION: No evidence of significant lower extremity peripheral arterial disease.     Electronically Signed   By: Aliene Lloyd M.D.   On: 08/15/2024        ECHOCARDIOGRAM REPORT       Patient Name:   BRYLEE BERK Date of Exam: 04/24/2024 Medical Rec #:  [id number]        Height:       60.0 in Accession #:    [id number]       Weight:       168.9 lb Date of Birth:  1934-12-24       BSA:          1.737 m Patient Age:    89 years         BP:           133/76 mmHg Patient Gender: F                HR:           107 bpm. Exam Location:  Inpatient  Procedure: 2D Echo, Cardiac Doppler and Color Doppler (Both Spectral and Color            Flow Doppler were utilized during procedure).  Indications:    Dyspnea R06.00   History:        Patient has prior history of Echocardiogram examinations, most                 recent 06/04/2023. CHF, CAD, Pulmonary HTN, COPD and CKD, stage                 3; Risk Factors:Hypertension.   Sonographer:    Thea Norlander RCS Referring Phys: [id number] MAURICIO DANIEL ARRIEN  IMPRESSIONS    1. Left ventricular ejection fraction, by estimation, is 60 to 65%. The left ventricle has normal function. The left ventricle has no regional wall motion abnormalities. Left ventricular diastolic parameters were normal.  2. Right ventricular systolic function is normal. The right ventricular size is normal. There is moderately elevated pulmonary artery  systolic pressure. The estimated right ventricular systolic pressure is 57.0 mmHg.  3. Left atrial size was mildly dilated.  4. Right atrial size was mildly dilated.  5. The mitral valve is normal in structure. Mild to moderate mitral valve regurgitation. No evidence of mitral stenosis.  6. Aortic stenosis mild by gradients, but low flow by stroke volume, so likely underestimated. Moderate stenosis by valve area and DI. The aortic valve is tricuspid. There is moderate calcification of the aortic valve. There is moderate thickening of the aortic valve. Aortic valve regurgitation is not visualized. Mild to moderate aortic valve stenosis. Aortic valve mean gradient measures 16.0  mmHg.  7. Aortic dilatation noted. There is borderline dilatation of the ascending aorta, measuring 38 mm.  8. The inferior vena cava is dilated in size with <50% respiratory variability, suggesting right atrial pressure of 15 mmHg.  Comparison(s): Prior images reviewed side by side.  Conclusion(s)/Recommendation(s): Normal LVEF, normal diastolic parameters on current study, moderate MR, mild-moderate AS, elevated RA and RV pressures.  FINDINGS  Left Ventricle: Left ventricular ejection fraction, by estimation, is 60 to 65%. The left ventricle has normal function. The left ventricle has no regional wall motion abnormalities. The left ventricular internal cavity size was normal in size. There is  no left ventricular hypertrophy. Left ventricular diastolic parameters were normal.  Right Ventricle: The right ventricular size is normal. No increase in right ventricular wall thickness. Right ventricular systolic function is normal. There is moderately elevated pulmonary artery systolic pressure. The tricuspid regurgitant velocity is 3.24 m/s, and with an assumed right atrial pressure of 15 mmHg, the estimated right ventricular systolic pressure is 57.0 mmHg.  Left Atrium: Left atrial size was mildly  dilated.  Right Atrium: Right atrial size was mildly dilated.  Pericardium: Trivial pericardial effusion is present.  Mitral Valve: The mitral valve is normal in structure. Mild to moderate mitral valve regurgitation. No evidence of mitral valve stenosis.  Tricuspid Valve: The tricuspid valve is normal in structure. Tricuspid valve regurgitation is mild . No evidence of tricuspid stenosis.  Aortic Valve: Aortic stenosis mild by gradients, but low flow by stroke volume, so likely underestimated. Moderate stenosis by valve area and DI. The aortic valve is tricuspid. There is moderate calcification of the aortic valve. There is moderate thickening of the aortic valve. Aortic valve regurgitation is not visualized. Mild to moderate aortic stenosis is present. Aortic valve mean gradient measures 16.0 mmHg. Aortic valve peak gradient measures 28.1 mmHg. Aortic valve area, by VTI measures 1.01 cm.  Pulmonic Valve: The pulmonic valve was not well visualized. Pulmonic valve regurgitation is not visualized. No evidence of pulmonic stenosis.  Aorta: Aortic dilatation noted. There is borderline dilatation of the ascending aorta, measuring 38 mm.  Venous: The inferior vena cava is dilated in size with less than 50% respiratory variability, suggesting right atrial pressure of 15 mmHg.  IAS/Shunts: The atrial septum is grossly normal.    LEFT VENTRICLE PLAX 2D LVIDd:         3.20 cm   Diastology LVIDs:         2.00 cm   LV e' medial:    8.16 cm/s LV PW:         1.00 cm   LV E/e' medial:  13.8 LV IVS:        1.20 cm   LV e' lateral:   9.03 cm/s LVOT diam:     1.80 cm   LV E/e' lateral: 12.5 LV SV:         50 LV SV Index:   29 LVOT Area:     2.54 cm    RIGHT VENTRICLE             IVC RV S prime:     23.90 cm/s  IVC diam: 2.50 cm TAPSE (M-mode): 2.3 cm  LEFT ATRIUM             Index        RIGHT ATRIUM           Index LA diam:        3.40 cm 1.96 cm/m   RA Area:  17.50 cm LA  Vol (A2C):   42.8 ml 24.66 ml/m  RA Volume:   47.50 ml  27.34 ml/m LA Vol (A4C):   35.3 ml 20.32 ml/m LA Biplane Vol: 39.5 ml 22.74 ml/m  AORTIC VALVE AV Area (Vmax):    1.04 cm AV Area (Vmean):   0.97 cm AV Area (VTI):     1.01 cm AV Vmax:           265.00 cm/s AV Vmean:          187.333 cm/s AV VTI:            0.493 m AV Peak Grad:      28.1 mmHg AV Mean Grad:      16.0 mmHg LVOT Vmax:         108.00 cm/s LVOT Vmean:        71.300 cm/s LVOT VTI:          0.195 m LVOT/AV VTI ratio: 0.40   AORTA Ao Root diam: 3.20 cm Ao Asc diam:  3.80 cm  MITRAL VALVE                TRICUSPID VALVE MV Area (PHT): 5.52 cm     TR Peak grad:   42.0 mmHg MV Decel Time: 138 msec     TR Vmax:        324.00 cm/s MR Peak grad: 106.5 mmHg MR Vmax:      516.00 cm/s   SHUNTS MV E velocity: 112.50 cm/s  Systemic VTI:  0.20 m MV A velocity: 147.00 cm/s  Systemic Diam: 1.80 cm MV E/A ratio:  0.77  Shelda Bruckner MD Electronically signed by Shelda Bruckner MD Signature Date/Time: 04/24/2024/6:22:49 PM     Subjective:   Mary BROCKS Pridgen is a 89 y.o. female presenting today for follow up of  Chief Complaint  Patient presents with   Follow-up    She would like to know what medication makes her whole body hot ans sweaty. Her asthma is no recently flared up and still SOB.    Mary BROCKS [place] has a history of the following: Patient Active Problem List   Diagnosis Date Noted   Acute asthma exacerbation 04/22/2024   Obesity, class 1 04/22/2024   Seasonal and perennial allergic rhinitis 01/06/2024   Polyp of nasal cavity 01/06/2024   Moderate persistent asthma with acute exacerbation 07/05/2023   CKD stage 3a, GFR 45-59 ml/min (HCC) 07/05/2023   CAD (coronary artery disease), native coronary artery 06/30/2023   Edema of both ankles 06/22/2023   Ascending aorta dilatation (HCC) 06/04/2023   Pulmonary hypertension (HCC) 06/04/2023   Mitral regurgitation 06/04/2023   Tricuspid  regurgitation 06/04/2023   Acute on chronic diastolic CHF (congestive heart failure) (HCC) 06/04/2023   Asthma, chronic, unspecified asthma severity, with acute exacerbation 05/03/2023   HTN (hypertension) 05/03/2023   Acute respiratory failure with hypoxia (HCC) 05/03/2023   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   Osteoarthritis of right hip 09/11/2020   H/O allergic rhinitis 08/31/2015    History obtained from: chart review and {Persons; PED relatives w/patient:19415::patient}.  Discussed the use of AI scribe software for clinical note transcription with the patient and/or guardian, who gave verbal consent to proceed.  Mary Chase is a 89 y.o. female presenting for {Blank single:19197::a food challenge,a drug challenge,skin testing,a sick visit,an evaluation of ***,a follow up visit}.  She was last seen in June 2025.  At that time, lung testing was normal.  We continued with Pulmicort  and Perforomist   twice daily as well as Singulair  10 mg daily.  For her rhinitis, we continue with Flonase  as well as Zyrtec and Mucinex .  Since last visit,  She had an insect bite   Asthma/Respiratory Symptom History: ***  Allergic Rhinitis Symptom History: ***  Food Allergy Symptom History: ***  Skin Symptom History: ***  GERD Symptom History: ***  Infection Symptom History: ***  Otherwise, there have been no changes to her past medical history, surgical history, family history, or social history.    Review of systems otherwise negative other than that mentioned in the HPI.    Objective:   Blood pressure 96/62, pulse 88, temperature 98 F (36.7 C), temperature source Temporal, resp. rate 14, height 5' (1.524 m), weight 160 lb 1.6 oz (72.6 kg), SpO2 96%. Body mass index is 31.27 kg/m.    Physical Exam   Diagnostic studies:    Spirometry: results normal (FEV1: 0.62/48%, FVC: 1.24/74%, FEV1/FVC: 50%).    Spirometry consistent with mild obstructive disease. {Blank  single:19197::Albuterol /Atrovent  nebulizer,Xopenex /Atrovent  nebulizer,Albuterol  nebulizer,Albuterol  four puffs via MDI,Xopenex  four puffs via MDI} treatment given in clinic with {Blank single:19197::significant improvement in FEV1 per ATS criteria,significant improvement in FVC per ATS criteria,significant improvement in FEV1 and FVC per ATS criteria,improvement in FEV1, but not significant per ATS criteria,improvement in FVC, but not significant per ATS criteria,improvement in FEV1 and FVC, but not significant per ATS criteria,no improvement}.  Allergy Studies: {Blank single:19197::none,deferred due to recent antihistamine use,deferred due to insurance stipulations that require a separate visit for testing,labs sent instead, }    {Blank single:19197::Allergy testing results were read and interpreted by myself, documented by clinical staff., }      Marty Shaggy, MD  [facility] 

## 2024-09-05 NOTE — Patient Instructions (Addendum)
 1. Moderate persistent asthma, uncomplicated - Lung testing looked better today.  - I would continue with the same recommendations for your maintenance nebulizers (EVERY 12 HOURS).  - START taking: Yupelri  one vial once daily (samples provided). - This can be mixed into your nebulizer container with your other medications.  - Daily controller medication(s): Pulmicort  (budesonide ) 0.5 mg + Perforomist  (formoterol ) MIXED TOGETHER TWICE DAILY VIA NEBULIZER + Singulair  (montelukast ) 10mg  daily - Prior to physical activity: albuterol  2 puffs 10-15 minutes before physical activity. - Rescue medications: albuterol  4 puffs every 4-6 hours as needed and albuterol  nebulizer one vial every 4-6 hours as needed - Asthma control goals:  * Full participation in all desired activities (may need albuterol  before activity) * Albuterol  use two time or less a week on average (not counting use with activity) * Cough interfering with sleep two time or less a month * Oral steroids no more than once a year * No hospitalizations  2. 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  - Consider the Dupixent.  - Continue with Zyrtec (cetirizine) 10mg  daily as needed for runny nose/sneezing.  - Continue with Mucinex  as needed.  3. Return in about 3 months (around 12/05/2024).     Please inform us  of any Emergency Department visits, hospitalizations, or changes in symptoms. Call us  before going to the ED for breathing or allergy symptoms since we might be able to fit you in for a sick visit. Feel free to contact us  anytime with any questions, problems, or concerns.  It was a pleasure to see you again today!   Websites that have reliable patient information: 1. American Academy of Asthma, Allergy, and Immunology: www.aaaai.org 2. Food Allergy Research and Education (FARE): foodallergy.org 3. Mothers of Asthmatics: http://www.asthmacommunitynetwork.org 4. American  College of Allergy, Asthma, and Immunology: www.acaai.org     CLINICAL DATA:  Lower extremity swelling. Strange sensation in BILATERAL upper thighs. Hypertension.   EXAM: NONINVASIVE PHYSIOLOGIC VASCULAR STUDY OF BILATERAL LOWER EXTREMITIES   TECHNIQUE: Non-invasive vascular evaluation of both lower extremities was performed at rest, including calculation of ankle-brachial indices, multiple segmental pressure evaluation, segmental Doppler and segmental pulse volume recording.   COMPARISON:  None available.   FINDINGS: Right Lower Extremity   Resting ABI:  1.03   Resting TBI: 0.97   Segmental Pressures: Normal segmental pressures, no significant (20 mmHg) pressure gradient between adjacent segments.   Great toe pressure: 150 mmHg   Arterial Waveforms: Normal multi-phasic arterial waveforms.   PVRs: Normal PVRs with maintained waveform amplitude, augmentation and quality.   Left Lower Extremity:   Resting ABI: 1.22   Resting TBI: 0.92   Segmental Pressures: Normal segmental pressures, no significant (20 mmHg) pressure gradient between adjacent segments.   Great toe pressure: 142 mmHg   Arterial Waveforms: Normal multi-phasic arterial waveforms.   PVRs: Normal PVRs with maintained waveform amplitude, augmentation and quality.   Other: Symmetric upper extremity pressures.   Ankle Brachial index   > 1.4 Non diagnostic secondary to incompressible vessel calcifications   1.0-1.4       Normal   0.9-0.99     Borderline PAD   0.8-0.89     Mild PAD   0.5-0.79     Moderate PAD   < 0.5          Severe PAD   Toe Brachial Index   Normal     >0.65   Moderate  0.53-0.64   Severe     <  0.23   Toe Pressures   Absolute toe pressure >50mmHg sufficient for wound healing.   Toe pressures <74mmHg = critical limb ischemia.   IMPRESSION: No evidence of significant lower extremity peripheral arterial disease.     Electronically Signed   By: Aliene Lloyd  M.D.   On: 08/15/2024        ECHOCARDIOGRAM REPORT       Patient Name:   Mary Chase Date of Exam: 04/24/2024 Medical Rec #:  [id number]        Height:       60.0 in Accession #:    [id number]       Weight:       168.9 lb Date of Birth:  March 11, 1935       BSA:          1.737 m Patient Age:    89 years         BP:           133/76 mmHg Patient Gender: F                HR:           107 bpm. Exam Location:  Inpatient  Procedure: 2D Echo, Cardiac Doppler and Color Doppler (Both Spectral and Color            Flow Doppler were utilized during procedure).  Indications:    Dyspnea R06.00   History:        Patient has prior history of Echocardiogram examinations, most                 recent 06/04/2023. CHF, CAD, Pulmonary HTN, COPD and CKD, stage                 3; Risk Factors:Hypertension.   Sonographer:    Thea Norlander RCS Referring Phys: [id number] MAURICIO DANIEL ARRIEN  IMPRESSIONS    1. Left ventricular ejection fraction, by estimation, is 60 to 65%. The left ventricle has normal function. The left ventricle has no regional wall motion abnormalities. Left ventricular diastolic parameters were normal.  2. Right ventricular systolic function is normal. The right ventricular size is normal. There is moderately elevated pulmonary artery systolic pressure. The estimated right ventricular systolic pressure is 57.0 mmHg.  3. Left atrial size was mildly dilated.  4. Right atrial size was mildly dilated.  5. The mitral valve is normal in structure. Mild to moderate mitral valve regurgitation. No evidence of mitral stenosis.  6. Aortic stenosis mild by gradients, but low flow by stroke volume, so likely underestimated. Moderate stenosis by valve area and DI. The aortic valve is tricuspid. There is moderate calcification of the aortic valve. There is moderate thickening of the aortic valve. Aortic valve regurgitation is not visualized. Mild to moderate aortic valve stenosis.  Aortic valve mean gradient measures 16.0 mmHg.  7. Aortic dilatation noted. There is borderline dilatation of the ascending aorta, measuring 38 mm.  8. The inferior vena cava is dilated in size with <50% respiratory variability, suggesting right atrial pressure of 15 mmHg.  Comparison(s): Prior images reviewed side by side.  Conclusion(s)/Recommendation(s): Normal LVEF, normal diastolic parameters on current study, moderate MR, mild-moderate AS, elevated RA and RV pressures.  FINDINGS  Left Ventricle: Left ventricular ejection fraction, by estimation, is 60 to 65%. The left ventricle has normal function. The left ventricle has no regional wall motion abnormalities. The left ventricular internal cavity size was normal in size. There is  no left  ventricular hypertrophy. Left ventricular diastolic parameters were normal.  Right Ventricle: The right ventricular size is normal. No increase in right ventricular wall thickness. Right ventricular systolic function is normal. There is moderately elevated pulmonary artery systolic pressure. The tricuspid regurgitant velocity is 3.24 m/s, and with an assumed right atrial pressure of 15 mmHg, the estimated right ventricular systolic pressure is 57.0 mmHg.  Left Atrium: Left atrial size was mildly dilated.  Right Atrium: Right atrial size was mildly dilated.  Pericardium: Trivial pericardial effusion is present.  Mitral Valve: The mitral valve is normal in structure. Mild to moderate mitral valve regurgitation. No evidence of mitral valve stenosis.  Tricuspid Valve: The tricuspid valve is normal in structure. Tricuspid valve regurgitation is mild . No evidence of tricuspid stenosis.  Aortic Valve: Aortic stenosis mild by gradients, but low flow by stroke volume, so likely underestimated. Moderate stenosis by valve area and DI. The aortic valve is tricuspid. There is moderate calcification of the aortic valve. There is  moderate thickening of the aortic valve. Aortic valve regurgitation is not visualized. Mild to moderate aortic stenosis is present. Aortic valve mean gradient measures 16.0 mmHg. Aortic valve peak gradient measures 28.1 mmHg. Aortic valve area, by VTI measures 1.01 cm.  Pulmonic Valve: The pulmonic valve was not well visualized. Pulmonic valve regurgitation is not visualized. No evidence of pulmonic stenosis.  Aorta: Aortic dilatation noted. There is borderline dilatation of the ascending aorta, measuring 38 mm.  Venous: The inferior vena cava is dilated in size with less than 50% respiratory variability, suggesting right atrial pressure of 15 mmHg.  IAS/Shunts: The atrial septum is grossly normal.    LEFT VENTRICLE PLAX 2D LVIDd:         3.20 cm   Diastology LVIDs:         2.00 cm   LV e' medial:    8.16 cm/s LV PW:         1.00 cm   LV E/e' medial:  13.8 LV IVS:        1.20 cm   LV e' lateral:   9.03 cm/s LVOT diam:     1.80 cm   LV E/e' lateral: 12.5 LV SV:         50 LV SV Index:   29 LVOT Area:     2.54 cm    RIGHT VENTRICLE             IVC RV S prime:     23.90 cm/s  IVC diam: 2.50 cm TAPSE (M-mode): 2.3 cm  LEFT ATRIUM             Index        RIGHT ATRIUM           Index LA diam:        3.40 cm 1.96 cm/m   RA Area:     17.50 cm LA Vol (A2C):   42.8 ml 24.66 ml/m  RA Volume:   47.50 ml  27.34 ml/m LA Vol (A4C):   35.3 ml 20.32 ml/m LA Biplane Vol: 39.5 ml 22.74 ml/m  AORTIC VALVE AV Area (Vmax):    1.04 cm AV Area (Vmean):   0.97 cm AV Area (VTI):     1.01 cm AV Vmax:           265.00 cm/s AV Vmean:          187.333 cm/s AV VTI:            0.493 m  AV Peak Grad:      28.1 mmHg AV Mean Grad:      16.0 mmHg LVOT Vmax:         108.00 cm/s LVOT Vmean:        71.300 cm/s LVOT VTI:          0.195 m LVOT/AV VTI ratio: 0.40   AORTA Ao Root diam: 3.20 cm Ao Asc diam:  3.80 cm  MITRAL VALVE                TRICUSPID VALVE MV Area (PHT): 5.52 cm     TR  Peak grad:   42.0 mmHg MV Decel Time: 138 msec     TR Vmax:        324.00 cm/s MR Peak grad: 106.5 mmHg MR Vmax:      516.00 cm/s   SHUNTS MV E velocity: 112.50 cm/s  Systemic VTI:  0.20 m MV A velocity: 147.00 cm/s  Systemic Diam: 1.80 cm MV E/A ratio:  0.77  Shelda Bruckner MD Electronically signed by Shelda Bruckner MD Signature Date/Time: 04/24/2024/6:22:49 PM

## 2024-09-06 ENCOUNTER — Encounter: Payer: Self-pay | Admitting: Allergy & Immunology

## 2024-09-06 MED ORDER — BUDESONIDE 0.5 MG/2ML IN SUSP: 0.5000 mg | Freq: Two times a day (BID) | RESPIRATORY_TRACT | 1 refills | Status: DC

## 2024-09-06 MED ORDER — YUPELRI 175 MCG/3ML IN SOLN: 175.0000 ug | Freq: Every day | RESPIRATORY_TRACT | 3 refills | Status: DC

## 2024-09-06 MED ORDER — FORMOTEROL FUMARATE 20 MCG/2ML IN NEBU: 20.0000 ug | INHALATION_SOLUTION | Freq: Two times a day (BID) | RESPIRATORY_TRACT | 1 refills | Status: DC

## 2024-09-06 MED ORDER — FLUTICASONE PROPIONATE 50 MCG/ACT NA SUSP: 1.0000 | Freq: Every day | NASAL | 1 refills | Status: AC | PRN

## 2024-09-06 MED ORDER — MONTELUKAST SODIUM 10 MG PO TABS: 10.0000 mg | ORAL_TABLET | Freq: Every day | ORAL | 1 refills | Status: AC

## 2024-09-08 ENCOUNTER — Ambulatory Visit: Payer: Self-pay | Admitting: Pulmonary Disease

## 2024-09-08 NOTE — Addendum Note (Signed)
 Addended by: DANIEL NIVIA DEL on: 09/08/2024 03:56 PM   Modules accepted: Orders

## 2024-09-13 ENCOUNTER — Other Ambulatory Visit (HOSPITAL_COMMUNITY): Payer: Self-pay

## 2024-09-14 ENCOUNTER — Other Ambulatory Visit (HOSPITAL_COMMUNITY): Payer: Self-pay

## 2024-09-15 DIAGNOSIS — I2721 Secondary pulmonary arterial hypertension: Secondary | ICD-10-CM | POA: Diagnosis not present

## 2024-09-15 DIAGNOSIS — Z23 Encounter for immunization: Secondary | ICD-10-CM | POA: Diagnosis not present

## 2024-09-15 DIAGNOSIS — N1831 Chronic kidney disease, stage 3a: Secondary | ICD-10-CM | POA: Diagnosis not present

## 2024-09-15 DIAGNOSIS — Z13220 Encounter for screening for lipoid disorders: Secondary | ICD-10-CM | POA: Diagnosis not present

## 2024-09-15 DIAGNOSIS — I5032 Chronic diastolic (congestive) heart failure: Secondary | ICD-10-CM | POA: Diagnosis not present

## 2024-09-15 DIAGNOSIS — Z0000 Encounter for general adult medical examination without abnormal findings: Secondary | ICD-10-CM | POA: Diagnosis not present

## 2024-09-15 DIAGNOSIS — I70213 Atherosclerosis of native arteries of extremities with intermittent claudication, bilateral legs: Secondary | ICD-10-CM | POA: Diagnosis not present

## 2024-09-15 DIAGNOSIS — M7989 Other specified soft tissue disorders: Secondary | ICD-10-CM | POA: Diagnosis not present

## 2024-09-15 DIAGNOSIS — J45901 Unspecified asthma with (acute) exacerbation: Secondary | ICD-10-CM | POA: Diagnosis not present

## 2024-09-15 DIAGNOSIS — M810 Age-related osteoporosis without current pathological fracture: Secondary | ICD-10-CM | POA: Diagnosis not present

## 2024-09-15 DIAGNOSIS — I10 Essential (primary) hypertension: Secondary | ICD-10-CM | POA: Diagnosis not present

## 2024-09-26 ENCOUNTER — Ambulatory Visit: Admitting: Internal Medicine

## 2024-10-14 ENCOUNTER — Other Ambulatory Visit: Payer: Self-pay | Admitting: Allergy

## 2024-10-14 DIAGNOSIS — J9801 Acute bronchospasm: Secondary | ICD-10-CM | POA: Diagnosis not present

## 2024-10-23 NOTE — Telephone Encounter (Signed)
 I denied the prednisone  refill request. If she thinks that she needs prednisone , we should see her again.   OK to schedule with NP.  Marty Shaggy, MD [facility] 

## 2024-10-26 ENCOUNTER — Other Ambulatory Visit: Payer: Self-pay | Admitting: Allergy & Immunology

## 2024-10-30 ENCOUNTER — Telehealth: Payer: Self-pay | Admitting: Family Medicine

## 2024-10-30 ENCOUNTER — Other Ambulatory Visit: Payer: Self-pay | Admitting: Family Medicine

## 2024-10-30 MED ORDER — ALBUTEROL SULFATE (2.5 MG/3ML) 0.083% IN NEBU: 2.5000 mg | INHALATION_SOLUTION | RESPIRATORY_TRACT | 1 refills | Status: AC | PRN

## 2024-10-30 NOTE — Progress Notes (Signed)
 Albuterol  via nebulizer ordered

## 2024-10-30 NOTE — Telephone Encounter (Signed)
Ordered :) ty

## 2024-10-30 NOTE — Telephone Encounter (Signed)
 PT called for nebulizer/Albuterol  Rx - I advised that inhaler format had been sent on 11/06, but did not see the nebulizer version sent over.  Confirmed [facility], will send back to NP, Ambs for review, PT thanked

## 2024-11-07 ENCOUNTER — Telehealth: Payer: Self-pay | Admitting: *Deleted

## 2024-11-07 NOTE — Telephone Encounter (Signed)
 Patient had mailed outdated dupixent patient assistance form that was d/c 2 years ago. She had called to check on status and l/m regarding same. I called her back and l/m advising the Dupixent [employer] will not take the form she sent and I had mailed the updated form. She will need to get to me asap since beginning 12/1 they will not process any more apps for 2025 PAP

## 2024-11-10 ENCOUNTER — Encounter (HOSPITAL_COMMUNITY): Payer: Self-pay | Admitting: *Deleted

## 2024-11-10 ENCOUNTER — Inpatient Hospital Stay (HOSPITAL_COMMUNITY)
Admission: EM | Admit: 2024-11-10 | Discharge: 2024-11-15 | DRG: 202 | Disposition: A | Discharge status: Home / Self Care | Attending: Internal Medicine | Admitting: Internal Medicine

## 2024-11-10 ENCOUNTER — Emergency Department (HOSPITAL_COMMUNITY)

## 2024-11-10 ENCOUNTER — Other Ambulatory Visit: Payer: Self-pay

## 2024-11-10 DIAGNOSIS — E7800 Pure hypercholesterolemia, unspecified: Secondary | ICD-10-CM | POA: Diagnosis present

## 2024-11-10 DIAGNOSIS — Z79899 Other long term (current) drug therapy: Secondary | ICD-10-CM

## 2024-11-10 DIAGNOSIS — Z7982 Long term (current) use of aspirin: Secondary | ICD-10-CM

## 2024-11-10 DIAGNOSIS — I2510 Atherosclerotic heart disease of native coronary artery without angina pectoris: Secondary | ICD-10-CM | POA: Diagnosis present

## 2024-11-10 DIAGNOSIS — I083 Combined rheumatic disorders of mitral, aortic and tricuspid valves: Secondary | ICD-10-CM | POA: Diagnosis present

## 2024-11-10 DIAGNOSIS — N1831 Chronic kidney disease, stage 3a: Secondary | ICD-10-CM | POA: Diagnosis present

## 2024-11-10 DIAGNOSIS — Z881 Allergy status to other antibiotic agents status: Secondary | ICD-10-CM

## 2024-11-10 DIAGNOSIS — I272 Pulmonary hypertension, unspecified: Secondary | ICD-10-CM | POA: Diagnosis present

## 2024-11-10 DIAGNOSIS — J4541 Moderate persistent asthma with (acute) exacerbation: Principal | ICD-10-CM | POA: Diagnosis present

## 2024-11-10 DIAGNOSIS — E876 Hypokalemia: Secondary | ICD-10-CM | POA: Diagnosis present

## 2024-11-10 DIAGNOSIS — Z7984 Long term (current) use of oral hypoglycemic drugs: Secondary | ICD-10-CM

## 2024-11-10 DIAGNOSIS — J45901 Unspecified asthma with (acute) exacerbation: Secondary | ICD-10-CM | POA: Diagnosis present

## 2024-11-10 DIAGNOSIS — M810 Age-related osteoporosis without current pathological fracture: Secondary | ICD-10-CM | POA: Diagnosis present

## 2024-11-10 DIAGNOSIS — Z7901 Long term (current) use of anticoagulants: Secondary | ICD-10-CM

## 2024-11-10 DIAGNOSIS — J441 Chronic obstructive pulmonary disease with (acute) exacerbation: Secondary | ICD-10-CM | POA: Diagnosis present

## 2024-11-10 DIAGNOSIS — I130 Hypertensive heart and chronic kidney disease with heart failure and stage 1 through stage 4 chronic kidney disease, or unspecified chronic kidney disease: Secondary | ICD-10-CM | POA: Diagnosis present

## 2024-11-10 DIAGNOSIS — K219 Gastro-esophageal reflux disease without esophagitis: Secondary | ICD-10-CM | POA: Diagnosis present

## 2024-11-10 DIAGNOSIS — Z91018 Allergy to other foods: Secondary | ICD-10-CM

## 2024-11-10 DIAGNOSIS — I5032 Chronic diastolic (congestive) heart failure: Secondary | ICD-10-CM | POA: Diagnosis present

## 2024-11-10 DIAGNOSIS — Z96641 Presence of right artificial hip joint: Secondary | ICD-10-CM | POA: Diagnosis present

## 2024-11-10 DIAGNOSIS — Z1152 Encounter for screening for COVID-19: Secondary | ICD-10-CM

## 2024-11-10 DIAGNOSIS — I10 Essential (primary) hypertension: Secondary | ICD-10-CM | POA: Diagnosis present

## 2024-11-10 DIAGNOSIS — I739 Peripheral vascular disease, unspecified: Secondary | ICD-10-CM | POA: Diagnosis present

## 2024-11-10 DIAGNOSIS — Z888 Allergy status to other drugs, medicaments and biological substances status: Secondary | ICD-10-CM

## 2024-11-10 DIAGNOSIS — Z880 Allergy status to penicillin: Secondary | ICD-10-CM

## 2024-11-10 DIAGNOSIS — Z7951 Long term (current) use of inhaled steroids: Secondary | ICD-10-CM

## 2024-11-10 DIAGNOSIS — D571 Sickle-cell disease without crisis: Secondary | ICD-10-CM | POA: Diagnosis present

## 2024-11-10 DIAGNOSIS — Z882 Allergy status to sulfonamides status: Secondary | ICD-10-CM

## 2024-11-10 LAB — BASIC METABOLIC PANEL WITH GFR
Anion gap: 10 (ref 5–15)
BUN: 8 mg/dL (ref 8–23)
CO2: 30 mmol/L (ref 22–32)
Calcium: 9.4 mg/dL (ref 8.9–10.3)
Chloride: 101 mmol/L (ref 98–111)
Creatinine, Ser: 0.94 mg/dL (ref 0.44–1.00)
GFR, Estimated: 59 mL/min — ABNORMAL LOW (ref >60)
Glucose, Bld: 105 mg/dL — ABNORMAL HIGH (ref 70–99)
Potassium: 2.9 mmol/L — ABNORMAL LOW (ref 3.5–5.1)
Sodium: 141 mmol/L (ref 135–145)

## 2024-11-10 LAB — CBC WITH DIFFERENTIAL/PLATELET
Abs Immature Granulocytes: 0.02 K/uL (ref 0.00–0.07)
Basophils Absolute: 0.1 K/uL (ref 0.0–0.1)
Basophils Relative: 1 %
Eosinophils Absolute: 0.8 K/uL — ABNORMAL HIGH (ref 0.0–0.5)
Eosinophils Relative: 12 %
HCT: 36.9 % (ref 36.0–46.0)
Hemoglobin: 11.8 g/dL — ABNORMAL LOW (ref 12.0–15.0)
Immature Granulocytes: 0 %
Lymphocytes Relative: 22 %
Lymphs Abs: 1.5 K/uL (ref 0.7–4.0)
MCH: 31 pg (ref 26.0–34.0)
MCHC: 32 g/dL (ref 30.0–36.0)
MCV: 96.9 fL (ref 80.0–100.0)
Monocytes Absolute: 0.4 K/uL (ref 0.1–1.0)
Monocytes Relative: 6 %
Neutro Abs: 4 K/uL (ref 1.7–7.7)
Neutrophils Relative %: 59 %
Platelets: 222 K/uL (ref 150–400)
RBC: 3.81 MIL/uL — ABNORMAL LOW (ref 3.87–5.11)
RDW: 13.1 % (ref 11.5–15.5)
WBC: 6.9 K/uL (ref 4.0–10.5)
nRBC: 0 % (ref 0.0–0.2)

## 2024-11-10 LAB — RESP PANEL BY RT-PCR (RSV, FLU A&B, COVID)  RVPGX2
Influenza A by PCR: NEGATIVE
Influenza B by PCR: NEGATIVE
Resp Syncytial Virus by PCR: NEGATIVE
SARS Coronavirus 2 by RT PCR: NEGATIVE

## 2024-11-10 MED ORDER — MAGNESIUM SULFATE 2 GM/50ML IV SOLN
2.0000 g | Freq: Once | INTRAVENOUS | Status: AC
  Administered 2024-11-10: 2 g via INTRAVENOUS
  Filled 2024-11-10: qty 50

## 2024-11-10 MED ORDER — IPRATROPIUM-ALBUTEROL 0.5-2.5 (3) MG/3ML IN SOLN: 3.0000 mL | Freq: Once | RESPIRATORY_TRACT | Status: DC

## 2024-11-10 MED ORDER — ALBUTEROL SULFATE (2.5 MG/3ML) 0.083% IN NEBU
10.0000 mg/h | INHALATION_SOLUTION | Freq: Once | RESPIRATORY_TRACT | Status: AC
  Administered 2024-11-10: 10 mg/h via RESPIRATORY_TRACT
  Filled 2024-11-10: qty 12

## 2024-11-10 MED ORDER — METHYLPREDNISOLONE SODIUM SUCC 125 MG IJ SOLR
125.0000 mg | Freq: Once | INTRAMUSCULAR | Status: AC
  Administered 2024-11-10: 125 mg via INTRAVENOUS
  Filled 2024-11-10: qty 2

## 2024-11-10 MED ORDER — IPRATROPIUM BROMIDE 0.02 % IN SOLN
0.5000 mg | Freq: Once | RESPIRATORY_TRACT | Status: AC
  Administered 2024-11-10: 0.5 mg via RESPIRATORY_TRACT
  Filled 2024-11-10: qty 2.5

## 2024-11-10 MED ORDER — ALBUTEROL SULFATE HFA 108 (90 BASE) MCG/ACT IN AERS
2.0000 | INHALATION_SPRAY | Freq: Once | RESPIRATORY_TRACT | Status: AC
  Administered 2024-11-10: 2 via RESPIRATORY_TRACT
  Filled 2024-11-10: qty 6.7

## 2024-11-10 MED ORDER — POTASSIUM CHLORIDE CRYS ER 20 MEQ PO TBCR
40.0000 meq | EXTENDED_RELEASE_TABLET | Freq: Once | ORAL | Status: AC
  Administered 2024-11-10: 40 meq via ORAL
  Filled 2024-11-10: qty 2

## 2024-11-10 NOTE — ED Triage Notes (Signed)
 Pt is here for asthma exacerbation x 1 week.  Unrelieved by home inhalers.  Pt is having audible wheezing and speaking in full sentences.  Spo2 98% on RA.

## 2024-11-10 NOTE — ED Provider Triage Note (Signed)
 Emergency Medicine Provider Triage Evaluation Note  Mashelle Busick Droke , a 89 y.o. female  was evaluated in triage.  Pt complains of asthma exacerbation, wheezing, cough, congestion.  Review of Systems  Positive: Wheezing, cough, congestion Negative: Fever, nausea, vomiting, chest pain  Physical Exam  BP 130/83 (BP Location: Right Arm)   Pulse 99   Temp 98.5 F (36.9 C)   Resp (!) 21   SpO2 98%  Gen:   Awake, no distress   Resp:  Mildly tachypneic with wheezing in all lobes MSK:   Moves extremities without difficulty  Other:    Medical Decision Making  Medically screening exam initiated at 2:27 PM.  Appropriate orders placed.  Almarie BROCKS Barcus was informed that the remainder of the evaluation will be completed by another provider, this initial triage assessment does not replace that evaluation, and the importance of remaining in the ED until their evaluation is complete.  Labs and imaging ordered   Francis Ileana SAILOR, PA-C 11/10/24 1428

## 2024-11-10 NOTE — ED Triage Notes (Signed)
 Pt states she has had SHOB x 1 week. Pt states it feels like her asthma. Pt able to to speak in full sentences. Pt had breathing treatment that helped last night, but shob returned today.

## 2024-11-10 NOTE — ED Provider Notes (Signed)
 [place] EMERGENCY DEPARTMENT AT [facility] Provider Note   CSN: [id number] Arrival date & time: 11/10/24  1400     Patient presents with: Shortness of Breath and Asthma   Mary Chase is a 89 y.o. female.    Shortness of Breath Asthma Associated symptoms include shortness of breath.     Patient has a history of arthritis, asthma, hypertension, acid reflux, COPD, pneumonia, hypercholesterolemia, chronic kidney disease, aortic stenosis, coronary artery disease.  Patient states she has been having trouble with feeling short of breath this past week.  Patient has been trying to manage with her breathing treatments at home.  She has been wheezing.  She has been feeling more short of breath and today she did not feel like her nebulizer machine was working properly.  She is not have any chest pain.  No fevers.  No leg swelling  Prior to Admission medications   Medication Sig Start Date End Date Taking? Authorizing Provider  acetaminophen  (TYLENOL ) 500 MG tablet Take 500 mg by mouth in the morning and at bedtime.    [provider]  albuterol  (PROVENTIL ) (2.5 MG/3ML) 0.083% nebulizer solution Take 3 mLs (2.5 mg total) by nebulization every 4 (four) hours as needed for wheezing or shortness of breath. 10/30/24   Cari Arlean HERO, FNP  albuterol  (VENTOLIN  HFA) 108 (90 Base) MCG/ACT inhaler INHALE 4 PUFFS INTO THE LUNGS EVERY 4 (FOUR) HOURS AS NEEDED FOR WHEEZING OR SHORTNESS OF BREATH. 10/26/24   Iva Marty Saltness, MD  amLODipine  (NORVASC ) 10 MG tablet Take 10 mg by mouth daily. Patient not taking: Reported on 09/05/2024 11/23/22   [provider]  apixaban  (ELIQUIS ) 5 MG TABS tablet TAKE 1 TABLET BY MOUTH TWICE A DAY 08/02/24   Shlomo Wilbert SAUNDERS, MD  aspirin  EC 81 MG tablet Take 1 tablet (81 mg total) by mouth 2 (two) times daily. 09/16/20   Orlando Camellia POUR, PA-C  budesonide  (PULMICORT ) 0.5 MG/2ML nebulizer solution Take 2 mLs (0.5 mg total) by nebulization in the  morning and at bedtime. 09/06/24 10/06/24  Iva Marty Saltness, MD  Cholecalciferol (VITAMIN D3) 25 MCG (1000 UT) CAPS Take 1,000 Units by mouth daily in the afternoon.    [provider]  Cyanocobalamin (B-12) 5000 MCG CAPS Take 5,000 mcg by mouth daily.    [provider]  empagliflozin  (JARDIANCE ) 10 MG TABS tablet Take 1 tablet (10 mg total) by mouth daily. 05/31/24   Shlomo Wilbert SAUNDERS, MD  fluticasone  (FLONASE ) 50 MCG/ACT nasal spray Place 1 spray into both nostrils daily as needed for allergies or rhinitis. 09/06/24   Iva Marty Saltness, MD  formoterol  (PERFOROMIST ) 20 MCG/2ML nebulizer solution Take 2 mLs (20 mcg total) by nebulization 2 (two) times daily. 09/06/24   Iva Marty Saltness, MD  furosemide  (LASIX ) 20 MG tablet TAKE 1 TABLET BY MOUTH EVERY DAY 06/29/24   Shlomo Wilbert SAUNDERS, MD  losartan  (COZAAR ) 50 MG tablet Take 1 tablet (50 mg total) by mouth daily. 05/11/23   Elgergawy, Brayton RAMAN, MD  montelukast  (SINGULAIR ) 10 MG tablet Take 1 tablet (10 mg total) by mouth daily. 09/06/24   Iva Marty Saltness, MD  revefenacin  (YUPELRI ) 175 MCG/3ML nebulizer solution Take 3 mLs (175 mcg total) by nebulization daily. 09/06/24   Iva Marty Saltness, MD  rosuvastatin  (CRESTOR ) 5 MG tablet TAKE 1 TABLET BY MOUTH EVERY OTHER DAY 06/29/24   Shlomo Wilbert SAUNDERS, MD  spironolactone  (ALDACTONE ) 25 MG tablet Take 1/2 tablet (12.5 mg total) by mouth  daily. 05/31/24   Shlomo Wilbert SAUNDERS, MD    Allergies: Penicillins, Alendronate sodium, Cephalexin, Sulfa antibiotics, Sulfacetamide sodium, and Other    Review of Systems  Respiratory:  Positive for shortness of breath.     Updated Vital Signs BP (!) 143/82   Pulse (!) 106   Temp 98.3 F (36.8 C) (Oral)   Resp (!) 21   SpO2 96%   Physical Exam Vitals and nursing note reviewed.  Constitutional:      General: She is not in acute distress.    Appearance: She is well-developed.  HENT:     Head: Normocephalic and atraumatic.     Right Ear:  External ear normal.     Left Ear: External ear normal.  Eyes:     General: No scleral icterus.       Right eye: No discharge.        Left eye: No discharge.     Conjunctiva/sclera: Conjunctivae normal.  Neck:     Trachea: No tracheal deviation.  Cardiovascular:     Rate and Rhythm: Normal rate and regular rhythm.  Pulmonary:     Effort: Pulmonary effort is normal. No respiratory distress.     Breath sounds: No stridor. Wheezing present. No rales.     Comments: Speaking in full sentences Abdominal:     General: Bowel sounds are normal. There is no distension.     Palpations: Abdomen is soft.     Tenderness: There is no abdominal tenderness. There is no guarding or rebound.  Musculoskeletal:        General: No tenderness or deformity.     Cervical back: Neck supple.  Skin:    General: Skin is warm and dry.     Findings: No rash.  Neurological:     General: No focal deficit present.     Mental Status: She is alert.     Cranial Nerves: No cranial nerve deficit, dysarthria or facial asymmetry.     Sensory: No sensory deficit.     Motor: No abnormal muscle tone or seizure activity.     Coordination: Coordination normal.  Psychiatric:        Mood and Affect: Mood normal.     (all labs ordered are listed, but only abnormal results are displayed) Labs Reviewed  BASIC METABOLIC PANEL WITH GFR - Abnormal; Notable for the following components:      Result Value   Potassium 2.9 (*)    Glucose, Bld 105 (*)    GFR, Estimated 59 (*)    All other components within normal limits  CBC WITH DIFFERENTIAL/PLATELET - Abnormal; Notable for the following components:   RBC 3.81 (*)    Hemoglobin 11.8 (*)    Eosinophils Absolute 0.8 (*)    All other components within normal limits  RESP PANEL BY RT-PCR (RSV, FLU A&B, COVID)  [id number]    EKG: EKG Interpretation Date/Time:  Friday November 10 2024 14:31:12 EST Ventricular Rate:  92 PR Interval:  164 QRS Duration:  82 QT  Interval:  356 QTC Calculation: 440 R Axis:   66  Text Interpretation: Normal sinus rhythm Nonspecific T wave abnormality Abnormal ECG When compared with ECG of 24-May-2024 02:02, No significant change since last tracing Confirmed by Randol Simmonds [phone]) on 11/10/2024 7:21:47 PM  Radiology: [facility] Chest 2 View Result Date: 11/10/2024 CLINICAL DATA:  Asthma exacerbation EXAM: CHEST - 2 VIEW COMPARISON:  Chest x-ray 07/26/2024.  CT of the chest 08/23/2024. FINDINGS: The heart size and mediastinal  contours are within normal limits. Both lungs are clear. The visualized skeletal structures are unremarkable. Peripherally calcified nodular area is seen overlying the left chest, unchanged from prior. This corresponds to chest wall calcification on CT. IMPRESSION: No active cardiopulmonary disease. Electronically Signed   By: Greig Pique M.D.   On: 11/10/2024 15:18     Procedures   Medications Ordered in the ED  magnesium  sulfate IVPB 2 g 50 mL (has no administration in time range)  methylPREDNISolone  sodium succinate (SOLU-MEDROL ) 125 mg/2 mL injection 125 mg (125 mg Intravenous Given 11/10/24 1439)  albuterol  (VENTOLIN  HFA) 108 (90 Base) MCG/ACT inhaler 2 puff (2 puffs Inhalation Given 11/10/24 1440)  albuterol  (PROVENTIL ) (2.5 MG/3ML) 0.083% nebulizer solution (10 mg/hr Nebulization Given 11/10/24 2029)  ipratropium (ATROVENT ) nebulizer solution 0.5 mg (0.5 mg Nebulization Given 11/10/24 2030)  potassium chloride  SA (KLOR-CON  M) CR tablet 40 mEq (40 mEq Oral Given 11/10/24 2153)    Clinical Course as of 11/10/24 2334  Fri Nov 10, 2024  1920 Resp panel by RT-PCR (RSV, Flu A&B, Covid) Anterior Nasal Swab COVID flu RSV negative.  CBC unremarkable.  Metabolic panel does show hypokalemia [JK]  1921 Chest x-ray without acute findings [JK]  2324 Patient reexamined.  She still has wheezing.  Does not feel like she is back to baseline.  I will consult medical service for admission [JK]    Clinical Course  User Index [JK] Randol Simmonds, MD                                 Medical Decision Making Problems Addressed: Moderate persistent asthma with exacerbation: acute illness or injury that poses a threat to life or bodily functions  Amount and/or Complexity of Data Reviewed Labs:  Decision-making details documented in ED Course. Radiology: ordered.  Risk Prescription drug management.   Patient to the ED with complaints of shortness of breath.  On exam patient had notable wheezing.  X-ray did not show any signs of pneumonia.  Findings not suggestive of CHF.  Patient was treated with breathing treatment and steroids.  She continued to have wheezing still feels not quite back to her baseline and is not comfortable going home.  Will try magnesium .  I will consult with the medical service for admission  Case discussed with Dr Lorren     Final diagnoses:  Moderate persistent asthma with exacerbation    ED Discharge Orders     None          Randol Simmonds, MD 11/10/24 2336

## 2024-11-11 DIAGNOSIS — J45901 Unspecified asthma with (acute) exacerbation: Secondary | ICD-10-CM | POA: Diagnosis present

## 2024-11-11 DIAGNOSIS — J4541 Moderate persistent asthma with (acute) exacerbation: Secondary | ICD-10-CM | POA: Diagnosis not present

## 2024-11-11 MED ORDER — ARFORMOTEROL TARTRATE 15 MCG/2ML IN NEBU
15.0000 ug | INHALATION_SOLUTION | Freq: Two times a day (BID) | RESPIRATORY_TRACT | Status: DC
  Administered 2024-11-11 – 2024-11-15 (×8): 15 ug via RESPIRATORY_TRACT
  Filled 2024-11-11 (×8): qty 2

## 2024-11-11 MED ORDER — IPRATROPIUM BROMIDE 0.02 % IN SOLN: 0.5000 mg | RESPIRATORY_TRACT | Status: DC

## 2024-11-11 MED ORDER — APIXABAN 5 MG PO TABS
5.0000 mg | ORAL_TABLET | Freq: Two times a day (BID) | ORAL | Status: DC
  Administered 2024-11-11 – 2024-11-15 (×9): 5 mg via ORAL
  Filled 2024-11-11 (×9): qty 1

## 2024-11-11 MED ORDER — ASPIRIN 81 MG PO TBEC
81.0000 mg | DELAYED_RELEASE_TABLET | Freq: Two times a day (BID) | ORAL | Status: DC
  Administered 2024-11-11 – 2024-11-15 (×9): 81 mg via ORAL
  Filled 2024-11-11 (×9): qty 1

## 2024-11-11 MED ORDER — LOSARTAN POTASSIUM 50 MG PO TABS
50.0000 mg | ORAL_TABLET | Freq: Every day | ORAL | Status: DC
Start: 2024-11-11 — End: 2024-11-14
  Administered 2024-11-11 – 2024-11-14 (×4): 50 mg via ORAL
  Filled 2024-11-11 (×4): qty 1

## 2024-11-11 MED ORDER — B-12 5000 MCG PO CAPS: 5000.0000 ug | ORAL_CAPSULE | Freq: Every day | ORAL | Status: DC

## 2024-11-11 MED ORDER — MONTELUKAST SODIUM 10 MG PO TABS
10.0000 mg | ORAL_TABLET | Freq: Every day | ORAL | Status: DC
  Administered 2024-11-11 – 2024-11-15 (×5): 10 mg via ORAL
  Filled 2024-11-11 (×5): qty 1

## 2024-11-11 MED ORDER — ENSURE PLUS HIGH PROTEIN PO LIQD
237.0000 mL | Freq: Two times a day (BID) | ORAL | Status: DC
  Administered 2024-11-11 – 2024-11-15 (×9): 237 mL via ORAL

## 2024-11-11 MED ORDER — GUAIFENESIN 100 MG/5ML PO LIQD
5.0000 mL | ORAL | Status: DC | PRN
  Administered 2024-11-11 – 2024-11-14 (×8): 5 mL via ORAL
  Filled 2024-11-11 (×8): qty 5

## 2024-11-11 MED ORDER — IPRATROPIUM-ALBUTEROL 0.5-2.5 (3) MG/3ML IN SOLN
3.0000 mL | RESPIRATORY_TRACT | Status: DC
  Administered 2024-11-11 (×5): 3 mL via RESPIRATORY_TRACT
  Filled 2024-11-11 (×4): qty 3

## 2024-11-11 MED ORDER — SPIRONOLACTONE 12.5 MG HALF TABLET
12.5000 mg | ORAL_TABLET | Freq: Every day | ORAL | Status: DC
  Administered 2024-11-11 – 2024-11-15 (×5): 12.5 mg via ORAL
  Filled 2024-11-11 (×5): qty 1

## 2024-11-11 MED ORDER — ACETAMINOPHEN 325 MG PO TABS
650.0000 mg | ORAL_TABLET | Freq: Four times a day (QID) | ORAL | Status: DC | PRN
  Administered 2024-11-11 (×2): 650 mg via ORAL
  Filled 2024-11-11 (×2): qty 2

## 2024-11-11 MED ORDER — FORMOTEROL FUMARATE 20 MCG/2ML IN NEBU: 20.0000 ug | INHALATION_SOLUTION | Freq: Two times a day (BID) | RESPIRATORY_TRACT | Status: DC

## 2024-11-11 MED ORDER — FUROSEMIDE 20 MG PO TABS
20.0000 mg | ORAL_TABLET | Freq: Every day | ORAL | Status: DC
  Administered 2024-11-11 – 2024-11-15 (×5): 20 mg via ORAL
  Filled 2024-11-11 (×5): qty 1

## 2024-11-11 MED ORDER — VITAMIN D3 25 MCG (1000 UT) PO CAPS: 1000.0000 [IU] | ORAL_CAPSULE | Freq: Every day | ORAL | Status: DC

## 2024-11-11 MED ORDER — ROSUVASTATIN CALCIUM 5 MG PO TABS
5.0000 mg | ORAL_TABLET | ORAL | Status: DC
  Administered 2024-11-11 – 2024-11-15 (×3): 5 mg via ORAL
  Filled 2024-11-11 (×4): qty 1

## 2024-11-11 MED ORDER — PREDNISONE 20 MG PO TABS
40.0000 mg | ORAL_TABLET | Freq: Every day | ORAL | Status: DC
  Administered 2024-11-11 – 2024-11-14 (×4): 40 mg via ORAL
  Filled 2024-11-11 (×4): qty 2

## 2024-11-11 MED ORDER — AMLODIPINE BESYLATE 10 MG PO TABS
10.0000 mg | ORAL_TABLET | Freq: Every day | ORAL | Status: DC
  Administered 2024-11-11 – 2024-11-15 (×4): 10 mg via ORAL
  Filled 2024-11-11 (×3): qty 1
  Filled 2024-11-11: qty 2
  Filled 2024-11-11: qty 1

## 2024-11-11 MED ORDER — IPRATROPIUM-ALBUTEROL 0.5-2.5 (3) MG/3ML IN SOLN
3.0000 mL | Freq: Four times a day (QID) | RESPIRATORY_TRACT | Status: DC
  Administered 2024-11-12 – 2024-11-13 (×8): 3 mL via RESPIRATORY_TRACT
  Filled 2024-11-11 (×8): qty 3

## 2024-11-11 MED ORDER — BUDESONIDE 0.5 MG/2ML IN SUSP
2.0000 mg | Freq: Two times a day (BID) | RESPIRATORY_TRACT | Status: DC
  Administered 2024-11-11 – 2024-11-12 (×5): 2 mg via RESPIRATORY_TRACT
  Filled 2024-11-11 (×5): qty 8

## 2024-11-11 NOTE — ED Notes (Signed)
Breakfast tray ordered per patient request

## 2024-11-11 NOTE — Evaluation (Signed)
 Physical Therapy Evaluation Patient Details Name: Mary Chase MRN: [id number] DOB: November 13, 1935 Today's Date: 11/11/2024  History of Present Illness  The pt is an 89 yo female presenting with SOB. Admitted with asthma exacerbation. PMH includes: asthma, COPD, diastolic HF, aortic stenosis, CAD, CKD III, HTN, PVD, sickle cell.  Clinical Impression  Pt in bed upon arrival of PT, agreeable to evaluation at this time. Prior to admission the pt reports she was independent with use of rollator or cane depending on the day and living with her adult son. The pt reports she is typically able to manage all ADLs and IADLs. The pt now presents with limitations in functional mobility, strength, endurance, and dynamic stability due to above dx, and will continue to benefit from skilled PT to address these deficits. The pt was able to initiate sit-stand from EOB but required single UE support through HHA, to ambulate ~14ft, would benefit from use of home DME (RW or upright rollator) for further assessment of SpO2 with longer ambulation but VSS today. Session limited by arrival of transport to take pt to floor, but anticipate she will benefit from continued skilled PT to progress functional strength, power, and endurance prior to return home.          If plan is discharge home, recommend the following: A little help with walking and/or transfers;A little help with bathing/dressing/bathroom;Assistance with cooking/housework;Assist for transportation;Help with stairs or ramp for entrance   Can travel by private vehicle        Equipment Recommendations None recommended by PT  Recommendations for Other Services       Functional Status Assessment Patient has had a recent decline in their functional status and demonstrates the ability to make significant improvements in function in a reasonable and predictable amount of time.     Precautions / Restrictions Precautions Precautions: Fall Recall of  Precautions/Restrictions: Intact Restrictions Weight Bearing Restrictions Per Provider Order: No      Mobility  Bed Mobility Overal bed mobility: Needs Assistance Bed Mobility: Supine to Sit     Supine to sit: Contact guard          Transfers Overall transfer level: Needs assistance Equipment used: 1 person hand held assist Transfers: Sit to/from Stand Sit to Stand: Min assist           General transfer comment: takes few steps to transport chair, no overt buckling, able to manage pivotal steps with single UE support    Ambulation/Gait Ambulation/Gait assistance: Min assist Gait Distance (Feet): 8 Feet Assistive device: 1 person hand held assist Gait Pattern/deviations: Step-through pattern, Decreased stride length, Trunk flexed Gait velocity: WFL     General Gait Details: pt with significant trunk flexion, limited session due to arrival of transport to take pt to floor. will need to assess SpO2 with longer ambulation but stable on RA with short distance.     Balance Overall balance assessment: Needs assistance Sitting-balance support: Feet supported Sitting balance-Leahy Scale: Good Sitting balance - Comments: sits unsupported on edge of stretcher     Standing balance-Leahy Scale: Fair Standing balance comment: single UE support, no overt LOB                             Pertinent Vitals/Pain Pain Assessment Pain Assessment: No/denies pain    Home Living Family/patient expects to be discharged to:: Private residence Living Arrangements: Children (son) Available Help at Discharge: Family;Available PRN/intermittently Type of Home: House  Home Access: Stairs to enter Entrance Stairs-Rails: Can reach both Entrance Stairs-Number of Steps: 3   Home Layout: One level Home Equipment: Agricultural Consultant (2 wheels);Cane - single point;Hand held shower head;Shower seat;Other (comment) (upright rollator)      Prior Function Prior Level of Function :  Needs assist;Driving             Mobility Comments: use of rollator/RW ADLs Comments: ind per pt     Extremity/Trunk Assessment   Upper Extremity Assessment Upper Extremity Assessment: Defer to OT evaluation    Lower Extremity Assessment Lower Extremity Assessment: Generalized weakness (grossly 4-/5 with no change in sensation, 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)    Cervical / Trunk Assessment Cervical / Trunk Assessment: Kyphotic  Communication   Communication Communication: No apparent difficulties    Cognition Arousal: Alert Behavior During Therapy: WFL for tasks assessed/performed   PT - Cognitive impairments: No apparent impairments                       PT - Cognition Comments: not formally assessed, but functional in session Following commands: Intact       Cueing Cueing Techniques: Verbal cues     General Comments General comments (skin integrity, edema, etc.): VSS on RA, HR 120s at rest    Exercises     Assessment/Plan    PT Assessment Patient needs continued PT services  PT Problem List Decreased strength;Decreased activity tolerance;Decreased balance;Decreased mobility;Cardiopulmonary status limiting activity       PT Treatment Interventions DME instruction;Gait training;Stair training;Functional mobility training;Therapeutic activities;Therapeutic exercise;Balance training;Patient/family education    PT Goals (Current goals can be found in the Care Plan section)  Acute Rehab PT Goals Patient Stated Goal: to return home PT Goal Formulation: With patient Time For Goal Achievement: 11/25/24 Potential to Achieve Goals: Good    Frequency Min 2X/week        AM-PAC PT 6 Clicks Mobility  Outcome Measure Help needed turning from your back to your side while in a flat bed without using bedrails?: A Little Help needed moving from lying on your back to sitting on the side of a flat bed without using bedrails?: A Little Help needed moving to  and from a bed to a chair (including a wheelchair)?: A Little Help needed standing up from a chair using your arms (e.g., wheelchair or bedside chair)?: A Little Help needed to walk in hospital room?: A Lot (<20 ft this session) Help needed climbing 3-5 steps with a railing? : A Lot 6 Click Score: 16    End of Session Equipment Utilized During Treatment: Gait belt Activity Tolerance: Patient tolerated treatment well;Other (comment) (limited by arrival of transport to take pt to floor) Patient left: in chair (with transport) Nurse Communication: Mobility status PT Visit Diagnosis: Unsteadiness on feet (R26.81);Other abnormalities of gait and mobility (R26.89);Muscle weakness (generalized) (M62.81)    Time: 8953-8943 PT Time Calculation (min) (ACUTE ONLY): 10 min   Charges:   PT Evaluation $PT Eval Low Complexity: 1 Low   PT General Charges $$ ACUTE PT VISIT: 1 Visit         Izetta Call, PT, DPT   Acute Rehabilitation Department Office [phone] Secure Chat Communication Preferred  Izetta JULIANNA Call 11/11/2024, 1:24 PM

## 2024-11-11 NOTE — Progress Notes (Signed)
 Consultation Progress Note   Patient: Mary Chase FMW:[id number] DOB: February 19, 1935 DOA: 11/10/2024 DOS: the patient was seen and examined on 11/11/2024 Primary service: Jr Milliron, Landon BRAVO, MD  Brief hospital course:  Mary Chase is a 89 y.o. female with medical history significant of asthma COPD overlap syndrome, chronic diastolic heart failure who presented to the hospital with complaint of shortness of breath. According to the patient, over the last few months she has been having increasing dyspnea with ambulation and at rest. She reports that she is seen by cardiology and pulmonary medicine as well. She has her nebulizers and inhalers that she has been taking at home. She states that she was using her nebulizer however she believed that it was not working. She had an older model and decided to use that instead and place her nebulizer liquids. However she states that in spite of taking breathing treatments for 1 week her symptoms progressed. Therefore she presented to the hospital for further evaluation and treatment.    Assessment and Plan: Asthma/COPD overlap syndrome Moderate persistent asthma with acute exacerbation Will start the patient on duoneb, LABA/ICS Will continue to monitor the patient for clinical improvement      Hypertension Norvasc  10 mg daily Will continue losartan  50 mg daily, spironolactone  12.5 mg daily   CAD Will continue norvasc , losartan  spironolactone  AND crestor  as well    CKD stage 3 a Stable Will continue to monitor    Hypokalemia K is 2.9 Will give KCL 40 meq     DVT prophylaxis: lovenox   Code Status: full Family Communication:   Keena Jr.,Chologgis (Son) [phone] (Mobile)       {Tip this will not be part of the note when signed  DVT Prophylaxis  .Apixaban  (eliquis ) tablet 5 mg ,  (Optional):26781}   TRH will continue to follow the patient.  Subjective: No acute overnight events,Reports improved  breathing.  Physical  Exam: Vitals:   11/11/24 0922 11/11/24 0948 11/11/24 1000 11/11/24 1106  BP:  (!) 147/106 (!) 136/98 (!) 153/90  Pulse:   (!) 106 (!) 109  Resp:   19 18  Temp:    98.2 F (36.8 C)  TempSrc:    Oral  SpO2:   100% 99%  Height: 5' (1.524 m)      General  No acute Distress Eyes: PERRL, lids and conjunctivae normal ENMT: Mucous membranes are moist.   Neck: normal, supple, no masses, no thyromegaly Respiratory: clear to auscultation bilaterally, no wheezing, no crackles. Diminished breath sounds No accessory muscle use.  Cardiovascular: Regular rate and rhythm, no murmurs / rubs / gallops Abdomen: Soft, nontender nondistended. Musculoskeletal: no clubbing / cyanosis. No joint deformity upper and lower extremities.  Skin: no rashes, lesions, ulcers. No induration Neurologic: Facial asymmetry, moving extremity spontaneously, speech fluent. Psychiatric: Normal judgment and insight. Alert and oriented x 3. Normal mood.   Data Reviewed: {Tip this will not be part of the note when signed- Document your independent interpretation of telemetry tracing, EKG, lab, Radiology test or any other diagnostic tests. Add any new diagnostic test ordered today. (Optional):26781}   CBC    Component Value Date/Time   WBC 6.9 11/10/2024 1434   RBC 3.81 (L) 11/10/2024 1434   HGB 11.8 (L) 11/10/2024 1434   HGB 10.6 (L) 06/22/2023 1454   HCT 36.9 11/10/2024 1434   HCT 32.1 (L) 06/22/2023 1454   PLT 222 11/10/2024 1434   PLT 304 06/22/2023 1454   MCV 96.9 11/10/2024 1434   MCV  96 06/22/2023 1454   MCH 31.0 11/10/2024 1434   MCHC 32.0 11/10/2024 1434   RDW 13.1 11/10/2024 1434   RDW 14.1 06/22/2023 1454   LYMPHSABS 1.5 11/10/2024 1434   LYMPHSABS 2.6 04/14/2022 0949   MONOABS 0.4 11/10/2024 1434   EOSABS 0.8 (H) 11/10/2024 1434   EOSABS 0.5 (H) 04/14/2022 0949   BASOSABS 0.1 11/10/2024 1434   BASOSABS 0.1 04/14/2022 0949   CMP     Component Value Date/Time   NA 141 11/10/2024 1434   NA 142  06/22/2023 1454   K 2.9 (L) 11/10/2024 1434   CL 101 11/10/2024 1434   CO2 30 11/10/2024 1434   GLUCOSE 105 (H) 11/10/2024 1434   BUN 8 11/10/2024 1434   BUN 9 06/22/2023 1454   CREATININE 0.94 11/10/2024 1434   CALCIUM  9.4 11/10/2024 1434   PROT 8.2 (H) 04/21/2024 2240   PROT 7.0 04/28/2022 1144   ALBUMIN  3.4 (L) 04/21/2024 2240   AST 18 04/21/2024 2240   ALT 7 04/21/2024 2240   ALKPHOS 73 04/21/2024 2240   BILITOT 1.2 04/21/2024 2240   EGFR 49 (L) 06/22/2023 1454   GFRNONAA 59 (L) 11/10/2024 1434     Author: Landon FORBES Baller, MD 11/11/2024 1:22 PM  For on call review [email or web address].

## 2024-11-11 NOTE — H&P (Signed)
 History and Physical    Mary Chase FMW:[id number] DOB: [birth year] DOA: 11/10/2024  PCP: Elliot Charm, MD Patient coming from: Home  Chief Complaint: shortness of breath  HPI: Mary Chase is a 89 y.o. female with medical history significant of asthma COPD overlap syndrome, chronic diastolic heart failure who presented to the hospital with complaint of shortness of breath. According to the patient, over the last few months she has been having increasing dyspnea with ambulation and at rest. She reports that she is seen by cardiology and pulmonary medicine as well. She has her nebulizers and inhalers that she has been taking at home. She states that she was using her nebulizer however she believed that it was not working. She had an older model and decided to use that instead and place her nebulizer liquids. However she states that in spite of taking breathing treatments for 1 week her symptoms progressed. Therefore she presented to the hospital for further evaluation and treatment.  ED Course:  In the ER, B 129/84 P, HR 97, RR 17, O2 saturation 99% on 10 L, and Tmax 98.5. Cbc demonstrated wbc 6.9,  hb/hct 11.8/36.9, and platelet 222. Chemistry demonstrated Na 141, K 2.9, Cl 101, bicarb 30, Bun/Cr 8/0.94 and glucose 105.   CXR demonstrated no acute cardiopulmonary findings. Respiratory PCR was negative. EKG which demonstrated normal sinus rhythm.  Review of Systems:  All systems reviewed and apart from history of presenting illness, are negative.  Past Medical History:  Diagnosis Date   Age related osteoporosis    Allergic rhinitis    Aortic stenosis    Mild by echo 05/2023   Arthritis    Ascending aorta dilatation    2D echo 06/10/2023 with ascending aorta measurement 40 mm   Asthma    Atherosclerosis of native artery of both lower extremities with intermittent claudication    CAD (coronary artery disease), native coronary artery    coronary Ca score of 452 with <25%  stenosis of prox to mid LAD and 25-49% prox RCA by coronary CTA 7/24   Cataract    Chronic kidney disease, stage 3a (HCC)    COPD (chronic obstructive pulmonary disease) (HCC)    Degenerative joint disease (DJD) of hip    Dyspnea    with exertion   GERD (gastroesophageal reflux disease)    Headache    Hypercholesterolemia    Hypertension    Hypertensive retinopathy    Mitral regurgitation    Moderate to severe by echo 05/2023   Osteoarthritis of hip    Pneumonia    Pre-diabetes    A1c within normal limits last check   Pulmonary hypertension (HCC)    Mild to moderate with PASP 44 mmHg by echo 6-24   PVD (peripheral vascular disease)    Sickle cell anemia (HCC)    trait   Sickle cell trait    Tricuspid regurgitation    Moderate by echo 06/10/2023    Past Surgical History:  Procedure Laterality Date   ANKLE SURGERY Right 2002   pin   BREAST BIOPSY Right 11/01/2013   Procedure: RIGHT BREAST MASS EXCISION;  Surgeon: Donnice Bury, MD;  Location: [facility] OR;  Service: General;  Laterality: Right;   CATARACT EXTRACTION     ELBOW SURGERY Right    pin   MULTIPLE TOOTH EXTRACTIONS     TEE WITHOUT CARDIOVERSION N/A 08/03/2023   Procedure: TRANSESOPHAGEAL ECHOCARDIOGRAM;  Surgeon: Barbaraann Darryle Ned, MD;  [facility]: [facility] INVASIVE CV LAB;  Service: Cardiovascular;  Laterality: N/A;   TOTAL HIP ARTHROPLASTY Right 09/16/2020   Procedure: RIGHT TOTAL HIP ARTHROPLASTY ANTERIOR APPROACH;  Surgeon: Liam Lerner, MD;  Location: [facility] ORS;  Service: Orthopedics;  Laterality: Right;   TUBAL LIGATION       reports that she has never smoked. She has never used smokeless tobacco. She reports current alcohol use. She reports that she does not use drugs.  Allergies  Allergen Reactions   Penicillins Shortness Of Breath and Rash   Alendronate Sodium     hair loss, muscle aches, SOB,weight issues   Cephalexin Itching   Sulfa Antibiotics Itching and Other (See Comments)    Swelling in mouth    Sulfacetamide Sodium Itching   Other Itching    Some beans     History reviewed. No pertinent family history.  Prior to Admission medications   Medication Sig Start Date End Date Taking? Authorizing Provider  acetaminophen  (TYLENOL ) 500 MG tablet Take 500 mg by mouth in the morning and at bedtime.   Yes [provider]  albuterol  (PROVENTIL ) (2.5 MG/3ML) 0.083% nebulizer solution Take 3 mLs (2.5 mg total) by nebulization every 4 (four) hours as needed for wheezing or shortness of breath. 10/30/24  Yes Ambs, Arlean HERO, FNP  albuterol  (VENTOLIN  HFA) 108 (90 Base) MCG/ACT inhaler INHALE 4 PUFFS INTO THE LUNGS EVERY 4 (FOUR) HOURS AS NEEDED FOR WHEEZING OR SHORTNESS OF BREATH. 10/26/24  Yes Iva Marty Saltness, MD  amLODipine  (NORVASC ) 10 MG tablet Take 10 mg by mouth daily. 11/23/22  Yes [provider]  apixaban  (ELIQUIS ) 5 MG TABS tablet TAKE 1 TABLET BY MOUTH TWICE A DAY 08/02/24  Yes Turner, Wilbert SAUNDERS, MD  aspirin  EC 81 MG tablet Take 1 tablet (81 mg total) by mouth 2 (two) times daily. 09/16/20  Yes Orlando Locus K, PA-C  budesonide  (PULMICORT ) 0.5 MG/2ML nebulizer solution Take 2 mLs (0.5 mg total) by nebulization in the morning and at bedtime. 09/06/24 11/11/25 Yes Iva Marty Saltness, MD  Cholecalciferol (VITAMIN D3) 25 MCG (1000 UT) CAPS Take 1,000 Units by mouth daily.   Yes [provider]  Cyanocobalamin (B-12) 5000 MCG CAPS Take 5,000 mcg by mouth daily.   Yes [provider]  empagliflozin  (JARDIANCE ) 10 MG TABS tablet Take 1 tablet (10 mg total) by mouth daily. 05/31/24  Yes Turner, Wilbert SAUNDERS, MD  fluticasone  (FLONASE ) 50 MCG/ACT nasal spray Place 1 spray into both nostrils daily as needed for allergies or rhinitis. Patient taking differently: Place 1 spray into both nostrils in the morning and at bedtime. 09/06/24  Yes Iva Marty Saltness, MD  formoterol  (PERFOROMIST ) 20 MCG/2ML nebulizer solution Take 2 mLs (20 mcg total) by nebulization 2 (two) times  daily. 09/06/24  Yes Iva Marty Saltness, MD  furosemide  (LASIX ) 20 MG tablet TAKE 1 TABLET BY MOUTH EVERY DAY 06/29/24  Yes Turner, Wilbert SAUNDERS, MD  losartan  (COZAAR ) 50 MG tablet Take 1 tablet (50 mg total) by mouth daily. 05/11/23  Yes Elgergawy, Brayton RAMAN, MD  montelukast  (SINGULAIR ) 10 MG tablet Take 1 tablet (10 mg total) by mouth daily. 09/06/24  Yes Iva Marty Saltness, MD  revefenacin  (YUPELRI ) 175 MCG/3ML nebulizer solution Take 3 mLs (175 mcg total) by nebulization daily. Patient taking differently: Take 175 mcg by nebulization in the morning and at bedtime. 09/06/24  Yes Iva Marty Saltness, MD  rosuvastatin  (CRESTOR ) 5 MG tablet TAKE 1 TABLET BY MOUTH EVERY OTHER DAY 06/29/24  Yes Shlomo Wilbert SAUNDERS, MD  spironolactone  (ALDACTONE ) 25 MG tablet Take  1/2 tablet (12.5 mg total) by mouth daily. 05/31/24  Yes Shlomo Wilbert SAUNDERS, MD    Physical Exam: Vitals:   11/10/24 2110 11/10/24 2120 11/10/24 2130 11/10/24 2131  BP:   (!) 143/82   Pulse: (!) 106 (!) 108 (!) 106   Resp: (!) 21 20 (!) 21   Temp:    98.3 F (36.8 C)  TempSrc:    Oral  SpO2: 100% 98% 96%     Physical Exam Constitutional:      General: He is not in acute distress.    Appearance: Normal appearance.  HENT:     Head: Normocephalic and atraumatic.  Eyes:     Extraocular Movements: Extraocular movements intact.     Conjunctiva/sclera: Conjunctivae normal.     Pupils: Pupils are equal, round, and reactive to light.  Cardiovascular:     Rate and Rhythm: Normal rate and regular rhythm.     Pulses: Normal pulses.     Heart sounds: Normal heart sounds.  Pulmonary:     Effort: Pulmonary effort is normal. No respiratory distress.     Breath sounds: Normal breath sounds. Wheezing heard throughout on exam Abdominal:     General: Abdomen is flat. Bowel sounds are normal. There is no distension.     Palpations: Abdomen is soft.     Tenderness: There is no abdominal tenderness.  Musculoskeletal:        General: No deformity.  Normal range of motion.  Skin:    General: Skin is warm and dry.     Coloration: Skin is not jaundiced.  Neurological:     General: No focal deficit present.     Mental Status: He is alert and oriented to person, place, and time. Mental status is at baseline.   Labs on Admission: I have personally reviewed following labs and imaging studies  CBC: Recent Labs  Lab 11/10/24 1434  WBC 6.9  NEUTROABS 4.0  HGB 11.8*  HCT 36.9  MCV 96.9  PLT 222   Basic Metabolic Panel: Recent Labs  Lab 11/10/24 1434  NA 141  K 2.9*  CL 101  CO2 30  GLUCOSE 105*  BUN 8  CREATININE 0.94  CALCIUM  9.4   GFR: CrCl cannot be calculated (Unknown ideal weight.). Liver Function Tests: No results for input(s): AST, ALT, ALKPHOS, BILITOT, PROT, ALBUMIN  in the last 168 hours. No results for input(s): LIPASE, AMYLASE in the last 168 hours. No results for input(s): AMMONIA in the last 168 hours. Coagulation Profile: No results for input(s): INR, PROTIME in the last 168 hours. Cardiac Enzymes: No results for input(s): CKTOTAL, CKMB, CKMBINDEX, TROPONINI in the last 168 hours. BNP (last 3 results) No results for input(s): PROBNP in the last 8760 hours. HbA1C: No results for input(s): HGBA1C in the last 72 hours. CBG: No results for input(s): GLUCAP in the last 168 hours. Lipid Profile: No results for input(s): CHOL, HDL, LDLCALC, TRIG, CHOLHDL, LDLDIRECT in the last 72 hours. Thyroid  Function Tests: No results for input(s): TSH, T4TOTAL, FREET4, T3FREE, THYROIDAB in the last 72 hours. Anemia Panel: No results for input(s): VITAMINB12, FOLATE, FERRITIN, TIBC, IRON, RETICCTPCT in the last 72 hours. Urine analysis:    Component Value Date/Time   COLORURINE YELLOW 04/22/2024 0536   APPEARANCEUR CLEAR 04/22/2024 0536   LABSPEC >1.046 (H) 04/22/2024 0536   PHURINE 5.0 04/22/2024 0536   GLUCOSEU NEGATIVE 04/22/2024 0536   HGBUR  SMALL (A) 04/22/2024 0536   BILIRUBINUR NEGATIVE 04/22/2024 0536   KETONESUR NEGATIVE 04/22/2024  0536   PROTEINUR 100 (A) 04/22/2024 0536   NITRITE NEGATIVE 04/22/2024 0536   LEUKOCYTESUR NEGATIVE 04/22/2024 0536    Radiological Exams on Admission: DG Chest 2 View Result Date: 11/10/2024 CLINICAL DATA:  Asthma exacerbation EXAM: CHEST - 2 VIEW COMPARISON:  Chest x-ray 07/26/2024.  CT of the chest 08/23/2024. FINDINGS: The heart size and mediastinal contours are within normal limits. Both lungs are clear. The visualized skeletal structures are unremarkable. Peripherally calcified nodular area is seen overlying the left chest, unchanged from prior. This corresponds to chest wall calcification on CT. IMPRESSION: No active cardiopulmonary disease. Electronically Signed   By: Greig Pique M.D.   On: 11/10/2024 15:18    EKG: Independently reviewed.   Assessment/Plan Principal Problem:   Moderate persistent asthma with acute exacerbation Active Problems:   HTN (hypertension)   CAD (coronary artery disease), native coronary artery   CKD stage 3a, GFR 45-59 ml/min (HCC)   Pulmonary hypertension (HCC)  Asthma/COPD overlap syndrome Moderate persistent asthma with acute exacerbation Will start the patient on duoneb, budesonide  along with peroformist Will continue to monitor the patient for clinical improvement    Hypertension Norvasc  10 mg daily Will continue losartan  50 mg daily Will continue spironolactone  12.5 mg daily  CAD Will continue norvasc , losartan  spironolactone  Will continue crestor  as well   CKD stage 3 a Stable Will continue to monitor   Hypokalemia K is 2.9 Will give KCL 40 meq   DVT prophylaxis: lovenox   Code Status: full Family Communication:   Michaux Jr.,Chologgis (Son) [phone] (Mobile)   Disposition Plan:  Patient class is not currently inpatient or observation. Update patient class prior to completing documentation    Consults called:  none Admission status: inpatient Level of care: Level of care:  The medical decision making on this patient was of high complexity and the patient is at high risk for clinical deterioration, therefore this is a level 3 visit.  The medical decision making is of moderate complexity, therefore this is a level 2 visit.  Bradly MARLA Drones MD [facility]  If 7PM-7AM, please contact night-coverage [email or web address]  11/11/2024, 12:56 AM

## 2024-11-11 NOTE — Progress Notes (Signed)
 Initial Nutrition Assessment  DOCUMENTATION CODES:   Not applicable  INTERVENTION:  Encourage adequate PO intake of meals and supplements to meet calorie and protein needs Changed ordering status to assist to ensure pt does not miss meals  Ensure Plus High Protein po BID, each supplement provides 350 kcal and 20 grams of protein.  Magic cup TID with meals, each supplement provides 290 kcal and 9 grams of protein  Obtain updated weight  NUTRITION DIAGNOSIS:   Increased nutrient needs related to acute illness (exacerbation of asthma/COPD) as evidenced by estimated needs.  GOAL:   Patient will meet greater than or equal to 90% of their needs  MONITOR:   PO intake, Supplement acceptance  REASON FOR ASSESSMENT:   Consult Assessment of nutrition requirement/status  ASSESSMENT:   Pt with hx of asthma COPD, heart failure, CKD 3, osteoarthritis, and GERD. Hx of R THA in 2021 and recent TEE in 2024. Admitted with complaints of SOB, diagnosed exacerbation of asthma.  RD working remotely at time of assessment and attempted to call pt's room with no answer. Information obtained per chart review. Pt admitted recently and has not had any diet intake documented since admission. Pt would likely benefit from ONS given issues with SOB which is likely impacting appetite. Will add Ensure supplements and magic cups.   No information about recent bowel movements available, will continue to monitor.  Wt appears stable per chart review, but requested nursing to obtain updated wt as it appears copy forwarded. Will defer physical exam to follow up.  Medications: Lasix  Prednisone    Labs: Potassium 2.9 (11/21) No recent A1c NUTRITION - FOCUSED PHYSICAL EXAM:  Deferred to follow up  Diet Order:   Diet Order             Diet regular Room service appropriate? Yes; Fluid consistency: Thin  Diet effective now                   EDUCATION NEEDS:   No education needs have been  identified at this time  Skin:   no skin assessment input at this time  Last BM:  unknown  Height:   Ht Readings from Last 1 Encounters:  11/11/24 5' (1.524 m)    Weight:   Wt Readings from Last 1 Encounters:  09/05/24 72.6 kg    Ideal Body Weight:  45.5 kg  BMI:  Body mass index is 31.27 kg/m.  Estimated Nutritional Needs:   Kcal:  1300-1500  Protein:  65-80g  Fluid:  1.3-1.5L    Josette Glance, MS, RDN, LDN Clinical Dietitian I Please reach out via secure chat

## 2024-11-11 NOTE — Evaluation (Signed)
 Occupational Therapy Evaluation Patient Details Name: Mary Chase MRN: [id number] DOB: Feb 11, 1935 Today's Date: 11/11/2024   History of Present Illness   89 y/o F presenting to ED on 11/21 with SOB, admitted for moderate persistent asthma with exacerbation    PMH includes asthma, COPD, CHF     Clinical Impressions Pt ind at baseline with ADLs and uses RW/rollator for mobility, lives with son who can provide PRN assist at d/c. Pt currently needs up to mod A for ADLs, CGA for bed mobility and min A for transfers with 1 person HHA.  Pt presenting with impairments listed below, will follow acutely. Recommend HHOT at d/c pending progression.     If plan is discharge home, recommend the following:   A little help with walking and/or transfers;A lot of help with bathing/dressing/bathroom;Assistance with cooking/housework;Assist for transportation;Help with stairs or ramp for entrance     Functional Status Assessment   Patient has had a recent decline in their functional status and demonstrates the ability to make significant improvements in function in a reasonable and predictable amount of time.     Equipment Recommendations   None recommended by OT     Recommendations for Other Services   PT consult     Precautions/Restrictions   Precautions Precautions: Fall Restrictions Weight Bearing Restrictions Per Provider Order: No     Mobility Bed Mobility Overal bed mobility: Needs Assistance Bed Mobility: Supine to Sit     Supine to sit: Contact guard          Transfers Overall transfer level: Needs assistance Equipment used: 1 person hand held assist Transfers: Sit to/from Stand Sit to Stand: Min assist           General transfer comment: takes few steps to transport chair      Balance Overall balance assessment: Needs assistance Sitting-balance support: Feet supported Sitting balance-Leahy Scale: Good Sitting balance - Comments: sits  unsupported on edge of stretcher     Standing balance-Leahy Scale: Fair                             ADL either performed or assessed with clinical judgement   ADL Overall ADL's : Needs assistance/impaired Eating/Feeding: Set up   Grooming: Set up;Standing   Upper Body Bathing: Minimal assistance   Lower Body Bathing: Moderate assistance   Upper Body Dressing : Minimal assistance   Lower Body Dressing: Moderate assistance   Toilet Transfer: Minimal assistance;Ambulation   Toileting- Clothing Manipulation and Hygiene: Minimal assistance       Functional mobility during ADLs: Minimal assistance       Vision   Vision Assessment?: No apparent visual deficits     Perception Perception: Not tested       Praxis Praxis: Not tested       Pertinent Vitals/Pain Pain Assessment Pain Assessment: No/denies pain     Extremity/Trunk Assessment Upper Extremity Assessment Upper Extremity Assessment: Generalized weakness   Lower Extremity Assessment Lower Extremity Assessment: Defer to PT evaluation   Cervical / Trunk Assessment Cervical / Trunk Assessment: Kyphotic   Communication Communication Communication: No apparent difficulties   Cognition Arousal: Alert Behavior During Therapy: WFL for tasks assessed/performed Cognition: No apparent impairments                               Following commands: Intact       Cueing  General Comments   Cueing Techniques: Verbal cues  VSS on RA   Exercises     Shoulder Instructions      Home Living Family/patient expects to be discharged to:: Private residence Living Arrangements: Children (son) Available Help at Discharge: Family;Available PRN/intermittently Type of Home: House Home Access: Stairs to enter [employer] of Steps: 3 Entrance Stairs-Rails: Can reach both Home Layout: One level     Bathroom Shower/Tub: Tub/shower unit         Home Equipment: Agricultural Consultant  (2 wheels);Cane - single point;Hand held shower head;Shower seat;Other (comment) (upright rollator)          Prior Functioning/Environment Prior Level of Function : Needs assist;Driving             Mobility Comments: use of rollator/RW ADLs Comments: ind per pt    OT Problem List: Decreased strength;Decreased range of motion;Decreased activity tolerance;Impaired balance (sitting and/or standing);Cardiopulmonary status limiting activity   OT Treatment/Interventions: Self-care/ADL training;Therapeutic exercise;Energy conservation;DME and/or AE instruction;Therapeutic activities;Balance training;Patient/family education      OT Goals(Current goals can be found in the care plan section)   Acute Rehab OT Goals Patient Stated Goal: did not state OT Goal Formulation: With patient Time For Goal Achievement: 11/25/24 Potential to Achieve Goals: Good ADL Goals Pt Will Perform Upper Body Dressing: with modified independence;sitting Pt Will Perform Lower Body Dressing: with modified independence;sitting/lateral leans;sit to/from stand Pt Will Transfer to Toilet: with modified independence;ambulating;regular height toilet Pt Will Perform Tub/Shower Transfer: Tub transfer;Shower transfer;with modified independence;ambulating;shower seat   OT Frequency:  Min 2X/week    Co-evaluation              AM-PAC OT 6 Clicks Daily Activity     Outcome Measure Help from another person eating meals?: None Help from another person taking care of personal grooming?: A Little Help from another person toileting, which includes using toliet, bedpan, or urinal?: A Little Help from another person bathing (including washing, rinsing, drying)?: A Lot Help from another person to put on and taking off regular upper body clothing?: A Little Help from another person to put on and taking off regular lower body clothing?: A Lot 6 Click Score: 17   End of Session Nurse Communication: Mobility  status  Activity Tolerance: Patient tolerated treatment well Patient left: Other (comment) (transport chair with transport tech)  OT Visit Diagnosis: Unsteadiness on feet (R26.81);Other abnormalities of gait and mobility (R26.89);Muscle weakness (generalized) (M62.81)                Time: 8953-8943 OT Time Calculation (min): 10 min Charges:  OT General Charges $OT Visit: 1 Visit OT Evaluation $OT Eval Low Complexity: 1 Low  Lashai Grosch K, OTD, OTR/L SecureChat Preferred Acute Rehab [phone]   Ediberto Sens K Koonce 11/11/2024, 1:02 PM

## 2024-11-11 NOTE — ED Notes (Signed)
 Pt eating breakfast

## 2024-11-12 DIAGNOSIS — J4541 Moderate persistent asthma with (acute) exacerbation: Secondary | ICD-10-CM | POA: Diagnosis not present

## 2024-11-12 LAB — HIV ANTIBODY (ROUTINE TESTING W REFLEX): HIV Screen 4th Generation wRfx: NONREACTIVE

## 2024-11-12 LAB — COMPREHENSIVE METABOLIC PANEL WITH GFR
ALT: 9 U/L (ref 0–44)
AST: 19 U/L (ref 15–41)
Albumin: 3.6 g/dL (ref 3.5–5.0)
Alkaline Phosphatase: 82 U/L (ref 38–126)
Anion gap: 14 (ref 5–15)
BUN: 20 mg/dL (ref 8–23)
CO2: 26 mmol/L (ref 22–32)
Calcium: 9.3 mg/dL (ref 8.9–10.3)
Chloride: 102 mmol/L (ref 98–111)
Creatinine, Ser: 1.36 mg/dL — ABNORMAL HIGH (ref 0.44–1.00)
GFR, Estimated: 38 mL/min — ABNORMAL LOW (ref >60)
Glucose, Bld: 98 mg/dL (ref 70–99)
Potassium: 3.4 mmol/L — ABNORMAL LOW (ref 3.5–5.1)
Sodium: 142 mmol/L (ref 135–145)
Total Bilirubin: 0.5 mg/dL (ref 0.0–1.2)
Total Protein: 7.5 g/dL (ref 6.5–8.1)

## 2024-11-12 MED ORDER — BUDESONIDE 0.5 MG/2ML IN SUSP
0.5000 mg | Freq: Two times a day (BID) | RESPIRATORY_TRACT | Status: DC
  Administered 2024-11-13 – 2024-11-15 (×5): 0.5 mg via RESPIRATORY_TRACT
  Filled 2024-11-12 (×5): qty 2

## 2024-11-12 MED ORDER — SIMETHICONE 80 MG PO CHEW
80.0000 mg | CHEWABLE_TABLET | Freq: Once | ORAL | Status: AC
  Administered 2024-11-12: 80 mg via ORAL
  Filled 2024-11-12: qty 1

## 2024-11-12 MED ORDER — POTASSIUM CHLORIDE 20 MEQ PO PACK
40.0000 meq | PACK | Freq: Once | ORAL | Status: AC
  Administered 2024-11-12: 40 meq via ORAL
  Filled 2024-11-12: qty 2

## 2024-11-12 MED ORDER — IPRATROPIUM-ALBUTEROL 0.5-2.5 (3) MG/3ML IN SOLN: 3.0000 mL | RESPIRATORY_TRACT | Status: DC | PRN

## 2024-11-12 NOTE — Plan of Care (Signed)
   Problem: Education: Goal: Knowledge of General Education information will improve Description Including pain rating scale, medication(s)/side effects and non-pharmacologic comfort measures Outcome: Progressing

## 2024-11-12 NOTE — Progress Notes (Signed)
 Consultation Progress Note   Patient: Mary Chase FMW:[id number] DOB: 04-09-1935 DOA: 11/10/2024 DOS: the patient was seen and examined on 11/12/2024 Primary service: Naoma Boxell, Landon BRAVO, MD  Brief hospital course:  Mary Chase is a 89 y.o. female with medical history significant of asthma COPD overlap syndrome, chronic diastolic heart failure who presented to the hospital with complaint of shortness of breath. According to the patient, over the last few months she has been having increasing dyspnea with ambulation and at rest. She reports that she is seen by cardiology and pulmonary medicine as well. She has her nebulizers and inhalers that she has been taking at home. She states that she was using her nebulizer however she believed that it was not working. She had an older model and decided to use that instead and place her nebulizer liquids. However she states that in spite of taking breathing treatments for 1 week her symptoms progressed. Therefore she presented to the hospital for further evaluation and treatment.    Assessment and Plan: Asthma/COPD overlap syndrome Moderate persistent asthma with acute exacerbation Will start the patient on duoneb, LABA/ICS -Continues to wheeze, no stable for dc yet Will continue to monitor the patient for clinical improvement - Prescription for nebulizer sent.      Hypertension Norvasc  10 mg daily Will continue losartan  50 mg daily, spironolactone  12.5 mg daily   CAD Will continue norvasc , losartan  spironolactone  AND crestor  as well    CKD stage 3 a Stable Will continue to monitor    Hypokalemia K is 3.4 Replace orally     DVT prophylaxis: lovenox   Code Status: full Family Communication:   Langenderfer Jr.,Chologgis (Son) [phone] ([place])          TRH will continue to follow the patient.  Subjective: No acute overnight events,Reports improved  breathing.  She does request sending prescription for Nebulizer to her  pharmacy.  Physical Exam: Vitals:   11/11/24 2057 11/12/24 0417 11/12/24 0740 11/12/24 1001  BP: 132/81 120/70  120/77  Pulse:  100  99  Resp:  16  18  Temp: 98.8 F (37.1 C) 98.7 F (37.1 C)    TempSrc: Oral Oral    SpO2: 100% 94% 96% 97%  Height:       General  No acute Distress Eyes: PERRL, lids and conjunctivae normal ENMT: Mucous membranes are moist.   Neck: normal, supple, no masses, no thyromegaly Respiratory:End expiratory wheezing bilaterally  no crackles.No accessory muscle use.  Cardiovascular: Regular rate and rhythm, no murmurs / rubs / gallops Abdomen: Soft, nontender nondistended. Musculoskeletal: no clubbing / cyanosis. No joint deformity upper and lower extremities.  Skin: no rashes, lesions, ulcers. No induration Neurologic: Facial asymmetry, moving extremity spontaneously, speech fluent. Psychiatric: Normal judgment and insight. Alert and oriented x 3. Normal mood.   Data Reviewed:    CBC    Component Value Date/Time   WBC 6.9 11/10/2024 1434   RBC 3.81 (L) 11/10/2024 1434   HGB 11.8 (L) 11/10/2024 1434   HGB 10.6 (L) 06/22/2023 1454   HCT 36.9 11/10/2024 1434   HCT 32.1 (L) 06/22/2023 1454   PLT 222 11/10/2024 1434   PLT 304 06/22/2023 1454   MCV 96.9 11/10/2024 1434   MCV 96 06/22/2023 1454   MCH 31.0 11/10/2024 1434   MCHC 32.0 11/10/2024 1434   RDW 13.1 11/10/2024 1434   RDW 14.1 06/22/2023 1454   LYMPHSABS 1.5 11/10/2024 1434   LYMPHSABS 2.6 04/14/2022 0949   MONOABS 0.4 11/10/2024 1434  EOSABS 0.8 (H) 11/10/2024 1434   EOSABS 0.5 (H) 04/14/2022 0949   BASOSABS 0.1 11/10/2024 1434   BASOSABS 0.1 04/14/2022 0949   CMP     Component Value Date/Time   NA 142 11/12/2024 0905   NA 142 06/22/2023 1454   K 3.4 (L) 11/12/2024 0905   CL 102 11/12/2024 0905   CO2 26 11/12/2024 0905   GLUCOSE 98 11/12/2024 0905   BUN 20 11/12/2024 0905   BUN 9 06/22/2023 1454   CREATININE 1.36 (H) 11/12/2024 0905   CALCIUM  9.3 11/12/2024 0905   PROT 7.5  11/12/2024 0905   PROT 7.0 04/28/2022 1144   ALBUMIN  3.6 11/12/2024 0905   AST 19 11/12/2024 0905   ALT 9 11/12/2024 0905   ALKPHOS 82 11/12/2024 0905   BILITOT 0.5 11/12/2024 0905   EGFR 49 (L) 06/22/2023 1454   GFRNONAA 38 (L) 11/12/2024 0905     Author: Landon FORBES Baller, MD 11/12/2024 12:21 PM  For on call review [email or web address].

## 2024-11-12 NOTE — Progress Notes (Signed)
 Called into room by Randine, NT to discuss fall mat policy with patient. Educated patient that fall mats were for her safety during mobility. Patient verbalized understanding but still asked that mat not be placed. Patient was agreeable to all other safety measures and agreed to push call button for assistance with ambulation.

## 2024-11-12 NOTE — Plan of Care (Signed)
  Problem: Nutrition: Goal: Adequate nutrition will be maintained Outcome: Progressing   Problem: Coping: Goal: Level of anxiety will decrease Outcome: Progressing   Problem: Pain Managment: Goal: General experience of comfort will improve and/or be controlled Outcome: Progressing   Problem: Skin Integrity: Goal: Risk for impaired skin integrity will decrease Outcome: Progressing

## 2024-11-13 ENCOUNTER — Inpatient Hospital Stay (HOSPITAL_COMMUNITY)

## 2024-11-13 ENCOUNTER — Telehealth: Payer: Self-pay | Admitting: Cardiology

## 2024-11-13 DIAGNOSIS — R9431 Abnormal electrocardiogram [ECG] [EKG]: Secondary | ICD-10-CM | POA: Diagnosis not present

## 2024-11-13 DIAGNOSIS — J4541 Moderate persistent asthma with (acute) exacerbation: Secondary | ICD-10-CM | POA: Diagnosis not present

## 2024-11-13 LAB — ECHOCARDIOGRAM COMPLETE
AR max vel: 1.55 cm2
AV Area VTI: 1.57 cm2
AV Area mean vel: 1.57 cm2
AV Mean grad: 12.5 mmHg
AV Peak grad: 24 mmHg
Ao pk vel: 2.45 m/s
Area-P 1/2: 4.41 cm2
Height: 60 in
S' Lateral: 2.2 cm

## 2024-11-13 MED ORDER — DOCUSATE SODIUM 100 MG PO CAPS: 100.0000 mg | ORAL_CAPSULE | Freq: Once | ORAL | Status: DC

## 2024-11-13 NOTE — Progress Notes (Addendum)
 Consultation Progress Note   Patient: Mary Chase FMW:[id number] DOB: 12-Oct-1935 DOA: 11/10/2024 DOS: the patient was seen and examined on 11/13/2024 Primary service: Christopherjohn Schiele, Landon BRAVO, MD  Brief hospital course:  Mary Chase is a 89 y.o. female with medical history significant of asthma COPD overlap syndrome, chronic diastolic heart failure who presented to the hospital with complaint of shortness of breath. According to the patient, over the last few months she has been having increasing dyspnea with ambulation and at rest. She reports that she is seen by cardiology and pulmonary medicine as well. She has her nebulizers and inhalers that she has been taking at home. She states that she was using her nebulizer however she believed that it was not working. She had an older model and decided to use that instead and place her nebulizer liquids. However she states that in spite of taking breathing treatments for 1 week her symptoms progressed. Therefore she presented to the hospital for further evaluation and treatment.    Assessment and Plan: Asthma/COPD overlap syndrome Moderate persistent asthma with acute exacerbation Will start the patient on duoneb, LABA/ICS -Continues to wheeze, no stable for dc yet. - Patient is slowly responding to p.o. steroids and bronchodilators. Will continue to monitor the patient for clinical improvement - Prescription for nebulizer sent. -likely Discharge tomorrow   Chronic diastolic heart failure 2D echo completed today shows grade 1 diastolic dysfunction.  Clinically  euvolemic Echo was ordered on outpatient basis, study was completed in her inpatient stay.    Hypertension Norvasc  10 mg daily Will continue losartan  50 mg daily, spironolactone  12.5 mg daily   CAD Will continue norvasc , losartan  spironolactone  AND crestor  as well    CKD stage 3 a Stable Will continue to monitor    Hypokalemia K is 3.4 Replace orally     DVT prophylaxis:  lovenox   Code Status: full Family Communication:   Ramo Jr.,Chologgis (Son) [phone] ([place])          TRH will continue to follow the patient.  Subjective: No acute overnight events,  Patient feels better but still not at her baseline.    She does request sending prescription for Nebulizer to her pharmacy.  Physical Exam: Vitals:   11/13/24 0856 11/13/24 0922 11/13/24 0936 11/13/24 1236  BP:  134/86 134/86   Pulse:  (!) 106    Resp:  18    Temp:  99.2 F (37.3 C)    TempSrc:  Oral    SpO2: 95% 99%    Weight:    78.5 kg  Height:       General  No acute Distress Eyes: PERRL, lids and conjunctivae normal ENMT: Mucous membranes are moist.   Neck: normal, supple, no masses, no thyromegaly Respiratory:End expiratory wheezing bilaterally  no crackles.No accessory muscle use.  Cardiovascular: Regular rate and rhythm, no murmurs / rubs / gallops Abdomen: Soft, nontender nondistended. Musculoskeletal: no clubbing / cyanosis. No joint deformity upper and lower extremities.  Skin: no rashes, lesions, ulcers. No induration Neurologic: Facial asymmetry, moving extremity spontaneously, speech fluent. Psychiatric: Normal judgment and insight. Alert and oriented x 3. Normal mood.   Data Reviewed:    CBC    Component Value Date/Time   WBC 6.9 11/10/2024 1434   RBC 3.81 (L) 11/10/2024 1434   HGB 11.8 (L) 11/10/2024 1434   HGB 10.6 (L) 06/22/2023 1454   HCT 36.9 11/10/2024 1434   HCT 32.1 (L) 06/22/2023 1454   PLT 222 11/10/2024 1434   PLT  304 06/22/2023 1454   MCV 96.9 11/10/2024 1434   MCV 96 06/22/2023 1454   MCH 31.0 11/10/2024 1434   MCHC 32.0 11/10/2024 1434   RDW 13.1 11/10/2024 1434   RDW 14.1 06/22/2023 1454   LYMPHSABS 1.5 11/10/2024 1434   LYMPHSABS 2.6 04/14/2022 0949   MONOABS 0.4 11/10/2024 1434   EOSABS 0.8 (H) 11/10/2024 1434   EOSABS 0.5 (H) 04/14/2022 0949   BASOSABS 0.1 11/10/2024 1434   BASOSABS 0.1 04/14/2022 0949   CMP     Component  Value Date/Time   NA 142 11/12/2024 0905   NA 142 06/22/2023 1454   K 3.4 (L) 11/12/2024 0905   CL 102 11/12/2024 0905   CO2 26 11/12/2024 0905   GLUCOSE 98 11/12/2024 0905   BUN 20 11/12/2024 0905   BUN 9 06/22/2023 1454   CREATININE 1.36 (H) 11/12/2024 0905   CALCIUM  9.3 11/12/2024 0905   PROT 7.5 11/12/2024 0905   PROT 7.0 04/28/2022 1144   ALBUMIN  3.6 11/12/2024 0905   AST 19 11/12/2024 0905   ALT 9 11/12/2024 0905   ALKPHOS 82 11/12/2024 0905   BILITOT 0.5 11/12/2024 0905   EGFR 49 (L) 06/22/2023 1454   GFRNONAA 38 (L) 11/12/2024 0905     Author: Landon FORBES Baller, MD 11/13/2024 1:41 PM  For on call review [email or web address].

## 2024-11-13 NOTE — Progress Notes (Signed)
 PT Cancellation Note  Patient Details Name: Mary Chase MRN: [id number] DOB: 22-Aug-1935   Cancelled Treatment:    Reason Eval/Treat Not Completed: Fatigue/lethargy limiting ability to participate  Patient working with OT on arrival (and just got on [facility]). Discussed goal of walking and she was adamant that her breathing is not good enough today. Patient noted to be short of breath/labored while seated on BSC. Will reattempt 11/25 after a breathing treatment, if we can coordinate.    Mary RAMAN, PT Acute Rehabilitation Services  Office [phone]  Mary Chase 11/13/2024, 3:19 PM

## 2024-11-13 NOTE — Plan of Care (Signed)

## 2024-11-13 NOTE — Telephone Encounter (Signed)
 FYI:  Spoke with patient and she wanted to make sure that her echo that was scheduled for today was canceled due to currently being in the hospital. Looks like it was already canceled.

## 2024-11-13 NOTE — Progress Notes (Signed)
 Occupational Therapy Treatment Patient Details Name: Mary Chase MRN: [id number] DOB: [birth year] Today's Date: 11/13/2024   History of present illness The pt is an 89 yo female presenting with SOB. Admitted with asthma exacerbation. PMH includes: asthma, COPD, diastolic HF, aortic stenosis, CAD, CKD III, HTN, PVD, sickle cell.   OT comments  Pt progressing towards functional goals, continues to be limited by shortness of breath/labored breathing and decreased activity tolerance. Focus of session on increasing independence with ADL tasks, education on energy conservation strategies, and progressing engagement in functional mobility. Pt required CGA for transfer to/from [facility], and continues to require up to Mod A for ADLs. OT to continue per POC. Continue to recommend HHOT services at d/c.       If plan is discharge home, recommend the following:  A little help with walking and/or transfers;A lot of help with bathing/dressing/bathroom;Assistance with cooking/housework;Assist for transportation;Help with stairs or ramp for entrance   Equipment Recommendations  None recommended by OT    Recommendations for Other Services      Precautions / Restrictions Precautions Precautions: Fall Recall of Precautions/Restrictions: Intact Restrictions Weight Bearing Restrictions Per Provider Order: No       Mobility Bed Mobility Overal bed mobility: Needs Assistance Bed Mobility: Supine to Sit     Supine to sit: Supervision     General bed mobility comments: Supervision and increased time required for bed mobility. No physical assistance required.    Transfers Overall transfer level: Needs assistance Equipment used: Rollator (4 wheels) Transfers: Sit to/from Stand, Bed to chair/wheelchair/BSC Sit to Stand: Contact guard assist Stand pivot transfers: Contact guard assist Squat pivot transfers: Contact guard assist       General transfer comment: Pt initially engaged in a squat  pivot without AD to get onto [facility]. PT arrived in session with rollator in which Pt utilized for return to sitting EOB. Pt required CGA for both transfers, anticipate increased assistance needed for longer duration on mobility.     Balance Overall balance assessment: Needs assistance Sitting-balance support: No upper extremity supported, Feet supported Sitting balance-Leahy Scale: Good Sitting balance - Comments: sits unsupported on EOB and on BSC   Standing balance support: Bilateral upper extremity supported, Single extremity supported, During functional activity, Reliant on assistive device for balance Standing balance-Leahy Scale: Fair Standing balance comment: Single UE support for squat pivot without AD. BUE supported on rollator return to bed. Pt states when she attempted to ambualte to bathroom prior to session, required use of furniture to stabilize.                           ADL either performed or assessed with clinical judgement   ADL Overall ADL's : Needs assistance/impaired                     Lower Body Dressing: Moderate assistance   Toilet Transfer: Contact guard Games Developer Details (indicate cue type and reason): Pt engaged in squat pivot with CGA to BSC on L side. Pt stated she could not safely ambulate to bathroom for toileting tasks. Toileting- Clothing Manipulation and Hygiene: Minimal assistance;Sitting/lateral lean Toileting - Clothing Manipulation Details (indicate cue type and reason): Min A for clothing management. Pt provided with CGA for anterior peri care. Pt stated hygene is difficult to complete at this time d/t having to use BSC as it does not allow her to lean effectively.  Extremity/Trunk Assessment Upper Extremity Assessment Upper Extremity Assessment: Generalized weakness            Vision   Vision Assessment?: No apparent visual deficits   Perception     Praxis      Communication Communication Communication: No apparent difficulties   Cognition Arousal: Alert Behavior During Therapy: WFL for tasks assessed/performed Cognition: No apparent impairments             OT - Cognition Comments: Pt apprehensive to mobility with increased fear regarding breathing abilities                 Following commands: Intact        Cueing   Cueing Techniques: Verbal cues  Exercises      Shoulder Instructions       General Comments Pt educated on energy conservation strategies and provided handout. Pt verbalized understanding and engaged in conversation regarding implementation of EC strategies into daily home routine.    Pertinent Vitals/ Pain       Pain Assessment Pain Assessment: No/denies pain  Home Living                                          Prior Functioning/Environment              Frequency  Min 2X/week        Progress Toward Goals  OT Goals(current goals can now be found in the care plan section)  Progress towards OT goals: Progressing toward goals  Acute Rehab OT Goals Patient Stated Goal: to breathe better OT Goal Formulation: With patient Time For Goal Achievement: 11/25/24 Potential to Achieve Goals: Good ADL Goals Pt Will Perform Upper Body Dressing: with modified independence;sitting Pt Will Perform Lower Body Dressing: with modified independence;sitting/lateral leans;sit to/from stand Pt Will Transfer to Toilet: with modified independence;ambulating;regular height toilet Pt Will Perform Tub/Shower Transfer: Tub transfer;Shower transfer;with modified independence;ambulating;shower seat  Plan      Co-evaluation                 AM-PAC OT 6 Clicks Daily Activity     Outcome Measure   Help from another person eating meals?: None Help from another person taking care of personal grooming?: A Little Help from another person toileting, which includes using toliet, bedpan, or  urinal?: A Little Help from another person bathing (including washing, rinsing, drying)?: A Lot Help from another person to put on and taking off regular upper body clothing?: A Little Help from another person to put on and taking off regular lower body clothing?: A Lot 6 Click Score: 17    End of Session Equipment Utilized During Treatment: Rollator (4 wheels)  OT Visit Diagnosis: Unsteadiness on feet (R26.81);Other abnormalities of gait and mobility (R26.89);Muscle weakness (generalized) (M62.81)   Activity Tolerance Patient tolerated treatment well   Patient Left in bed;with call bell/phone within reach;with family/visitor present   Nurse Communication          Time: 8566-8485 OT Time Calculation (min): 41 min  Charges: OT General Charges $OT Visit: 1 Visit OT Treatments $Self Care/Home Management : 23-37 mins $Therapeutic Activity: 8-22 mins  Maurilio CROME, OTR/L.  [facility] Acute Rehabilitation  Office: [phone]   Maurilio PARAS Alford Gamero 11/13/2024, 4:10 PM

## 2024-11-13 NOTE — TOC Initial Note (Signed)
 Transition of Care (TOC) - Initial/Assessment Note   Spoke to patient at bedside.   Discussed PT/OT recommendations for [facility] PT/OT . Patient recently had [facility] for home health. She does not feel like she needs home health PT/OT at this time . If she changes her mind after discharge she will ask PCP to arrange.    Patient has walker and rollator at home.   [facility] , [facility] at bedside said [facility] not [facility] so [facility] was [facility] to [facility]. NCM explained NCM can ask MD for order and ordered NEB machine to come to bedside. Both in agreement. NCM entered order and asked MD to sign. NEB machine for home ordered with Jermaine with [employer] .  Patient Details  Name: Mary Chase MRN: [id number] Date of Birth: [birth year]  Transition of Care [facility]) CM/SW Contact:    Stephane Powell Jansky, RN Phone Number: 11/13/2024, 11:03 AM  Clinical Narrative:                   Expected Discharge Plan: Home w Home Health Services Barriers to Discharge: Continued Medical Work up   Patient Goals and CMS Choice Patient states their goals for this hospitalization and ongoing recovery are:: to return to home CMS Medicare.gov Compare Post Acute Care list provided to:: Patient Choice offered to / list presented to : Patient      Expected Discharge Plan and Services   Discharge Planning Services: CM Consult Post Acute Care Choice: Home Health, Durable Medical Equipment Living arrangements for the past 2 months: Single Family Home                 DME Arranged: Nebulizer machine DME Agency: [employer] Date DME Agency Contacted: 11/13/24 Time DME Agency Contacted: 1100 Representative spoke with at DME Agency: [place] HH Arranged: PT, OT, Refused HH          Prior Living Arrangements/Services Living arrangements for the past 2 months: Single Family Home Lives with:: Adult Children Patient language and need for interpreter reviewed:: Yes         Need for Family Participation in Patient Care: No (Comment) Care giver support system in place?: Yes (comment) Current home services: DME Criminal Activity/Legal Involvement Pertinent to Current Situation/Hospitalization: No - Comment as needed  Activities of Daily Living   ADL Screening (condition at time of admission) Independently performs ADLs?: Yes (appropriate for developmental age) Is the patient deaf or have difficulty hearing?: No Does the patient have difficulty seeing, even when wearing glasses/contacts?: No Does the patient have difficulty concentrating, remembering, or making decisions?: No  Permission Sought/Granted   Permission granted to share information with : Yes, Verbal Permission Granted     Permission granted to share info w AGENCY: [employer]        Emotional Assessment Appearance:: Appears stated age Attitude/Demeanor/Rapport: Engaged Affect (typically observed): Appropriate Orientation: : Oriented to Self, Oriented to Place, Oriented to  Time, Oriented to Situation Alcohol / Substance Use: Not Applicable Psych Involvement: No (comment)  Admission diagnosis:  Moderate persistent asthma with exacerbation [J45.41] Asthma exacerbation [J45.901] Patient Active Problem List   Diagnosis Date Noted   Asthma exacerbation 11/11/2024   Acute asthma exacerbation 04/22/2024   Obesity, class 1 04/22/2024   Seasonal and perennial allergic rhinitis 01/06/2024   Polyp of nasal cavity 01/06/2024   Moderate persistent asthma with acute exacerbation 07/05/2023   CKD stage 3a, GFR 45-59 ml/min (HCC) 07/05/2023   CAD (  coronary artery disease), native coronary artery 06/30/2023   Edema of both ankles 06/22/2023   Ascending aorta dilatation 06/04/2023   Pulmonary hypertension (HCC) 06/04/2023   Mitral regurgitation 06/04/2023   Tricuspid regurgitation 06/04/2023   Acute on chronic diastolic CHF (congestive heart failure) (HCC) 06/04/2023   Asthma, chronic, unspecified  asthma severity, with acute exacerbation 05/03/2023   HTN (hypertension) 05/03/2023   Acute respiratory failure with hypoxia (HCC) 05/03/2023   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   Osteoarthritis of right hip 09/11/2020   H/O allergic rhinitis 08/31/2015   PCP:  Elliot Charm, MD Pharmacy:   [phone] [place], [place] - [address] AT [facility] [name] [place] [address] Phone: [phone] Fax: [phone]  [name] [address] [place] [address] Phone: [phone] Fax: [phone]  [id number] [place], [place] - [address] AT [facility] OF [name] [address] Phone: [phone] Fax: [phone]     Social Drivers of Health (SDOH) Social History: SDOH Screenings   Food Insecurity: No Food Insecurity (11/11/2024)  Housing: Low Risk  (11/11/2024)  Transportation Needs: No Transportation Needs (11/11/2024)  Utilities: Not At Risk (11/11/2024)  Social Connections: Moderately Integrated (11/11/2024)  Tobacco Use: Low Risk  (11/10/2024)   SDOH Interventions:     Readmission Risk Interventions    04/25/2024    4:30 PM 07/06/2023   10:20 AM  Readmission Risk Prevention Plan  Post Dischage Appt Complete   Medication Screening Complete   Transportation Screening Complete Complete  PCP or Specialist Appt within 5-7 Days  Complete  Home Care Screening  Complete  Medication Review (RN CM)  Referral to Pharmacy

## 2024-11-13 NOTE — Progress Notes (Signed)
  Echocardiogram 2D Echocardiogram has been performed.  Koleen KANDICE Popper, RDCS 11/13/2024, 11:23 AM

## 2024-11-13 NOTE — Telephone Encounter (Signed)
 Pt is currently in the hospital and having Echo done today and would like to speak to Dr Shlomo please advise

## 2024-11-14 ENCOUNTER — Ambulatory Visit (HOSPITAL_COMMUNITY)

## 2024-11-14 DIAGNOSIS — J4541 Moderate persistent asthma with (acute) exacerbation: Secondary | ICD-10-CM | POA: Diagnosis not present

## 2024-11-14 LAB — COMPREHENSIVE METABOLIC PANEL WITH GFR
ALT: 25 U/L (ref 0–44)
AST: 29 U/L (ref 15–41)
Albumin: 3.8 g/dL (ref 3.5–5.0)
Alkaline Phosphatase: 94 U/L (ref 38–126)
Anion gap: 11 (ref 5–15)
BUN: 28 mg/dL — ABNORMAL HIGH (ref 8–23)
CO2: 26 mmol/L (ref 22–32)
Calcium: 9.9 mg/dL (ref 8.9–10.3)
Chloride: 100 mmol/L (ref 98–111)
Creatinine, Ser: 1.3 mg/dL — ABNORMAL HIGH (ref 0.44–1.00)
GFR, Estimated: 40 mL/min — ABNORMAL LOW (ref >60)
Glucose, Bld: 158 mg/dL — ABNORMAL HIGH (ref 70–99)
Potassium: 4.5 mmol/L (ref 3.5–5.1)
Sodium: 137 mmol/L (ref 135–145)
Total Bilirubin: 0.5 mg/dL (ref 0.0–1.2)
Total Protein: 7.9 g/dL (ref 6.5–8.1)

## 2024-11-14 MED ORDER — LOSARTAN POTASSIUM 50 MG PO TABS
100.0000 mg | ORAL_TABLET | Freq: Every day | ORAL | Status: DC
  Administered 2024-11-15: 100 mg via ORAL
  Filled 2024-11-14: qty 2

## 2024-11-14 MED ORDER — PREDNISONE 20 MG PO TABS
40.0000 mg | ORAL_TABLET | Freq: Two times a day (BID) | ORAL | Status: DC
  Administered 2024-11-14 – 2024-11-15 (×2): 40 mg via ORAL
  Filled 2024-11-14 (×2): qty 2

## 2024-11-14 MED ORDER — IPRATROPIUM-ALBUTEROL 0.5-2.5 (3) MG/3ML IN SOLN
3.0000 mL | Freq: Three times a day (TID) | RESPIRATORY_TRACT | Status: DC
  Administered 2024-11-14 – 2024-11-15 (×5): 3 mL via RESPIRATORY_TRACT
  Filled 2024-11-14 (×5): qty 3

## 2024-11-14 NOTE — Progress Notes (Addendum)
 Physical Therapy Treatment Patient Details Name: Mary Chase MRN: [id number] DOB: December 30, 1934 Today's Date: 11/14/2024   History of Present Illness The pt is an 89 yo female presenting with SOB. Admitted with asthma exacerbation. PMH includes: asthma, COPD, diastolic HF, aortic stenosis, CAD, CKD III, HTN, PVD, sickle cell.    PT Comments  Pt received EOB, agreeable to work with SPTA. Pt requires cues to push off bed to standup rollator and lock the brakes prior to transferring. Pt educated on energy conservation during seated rest break during gait trial today. Pt required assist to lock the standup rollator and reach back before sitting down. Pt was able to ambulate in halls with standup rollator with up to +2 supervision and a chair follow, HR 130s and SpO2 95-98% throughout. MD and RN notified of increased HR with exertional activities. Continue to focus on safety and activity pacing in the next sessions. Pt continues to benefit from PT services to progress toward functional mobility goals.    If plan is discharge home, recommend the following: A little help with walking and/or transfers;A little help with bathing/dressing/bathroom;Assistance with cooking/housework;Assist for transportation;Help with stairs or ramp for entrance   Can travel by private vehicle        Equipment Recommendations  None recommended by PT    Recommendations for Other Services       Precautions / Restrictions Precautions Precautions: Fall Recall of Precautions/Restrictions: Intact Restrictions Weight Bearing Restrictions Per Provider Order: No     Mobility  Bed Mobility               General bed mobility comments: Pt EOB upon arrival    Transfers Overall transfer level: Needs assistance Equipment used: Rollator (4 wheels) (Stand up rollator) Transfers: Sit to/from Stand, Bed to chair/wheelchair/BSC Sit to Stand: Supervision   Step pivot transfers: Supervision       General  transfer comment: Pt stood from EOB to stand up rollator with supervision, verbal cues required for hand placement and brake mgmt. Pt requires more CGA with stand to sit due to fatigue and poor safety awareness with rollator.    Ambulation/Gait Ambulation/Gait assistance: Supervision, +2 safety/equipment (chair follow due to fatigue) Gait Distance (Feet): 100 Feet (+50 after seated rest break (lasting 3-5 minutes to recover)) Assistive device: Standup Rollator Gait Pattern/deviations: Narrow base of support, Decreased stride length, Step-through pattern Gait velocity: WFL     General Gait Details: Pt presents with improved posture due to stand up rollator during todays session. She has a narrow base of support verbal cues requires for safety   Stairs             Wheelchair Mobility     Tilt Bed    Modified Rankin (Stroke Patients Only)       Balance Overall balance assessment: Needs assistance Sitting-balance support: No upper extremity supported, Feet supported Sitting balance-Leahy Scale: Good Sitting balance - Comments: EOB and recliner   Standing balance support: No upper extremity supported, Bilateral upper extremity supported, During functional activity Standing balance-Leahy Scale: Fair Standing balance comment: Fair static, poor dynamic                            Communication Communication Communication: No apparent difficulties  Cognition Arousal: Alert Behavior During Therapy: WFL for tasks assessed/performed   PT - Cognitive impairments: No apparent impairments  PT - Cognition Comments: Pt remembers room number and was good with wayfidning in halls Following commands: Intact      Cueing Cueing Techniques: Verbal cues, Visual cues  Exercises      General Comments General comments (skin integrity, edema, etc.): Pt educated on energy conservation. Verbalized understanding but poor execution. Pt was slightly  impulsive to sit when needing a break, pt innformed to not wait until she was completely fatigued to rest. HR 130s with exertion.      Pertinent Vitals/Pain Pain Assessment Pain Assessment: 0-10 Pain Score: 5  (Worse when standing) Pain Location: Back Pain Descriptors / Indicators: Discomfort Pain Intervention(s): Monitored during session, Limited activity within patient's tolerance    Home Living                          Prior Function            PT Goals (current goals can now be found in the care plan section) Acute Rehab PT Goals Patient Stated Goal: to return home PT Goal Formulation: With patient Time For Goal Achievement: 11/25/24 Progress towards PT goals: Progressing toward goals    Frequency    Min 2X/week      PT Plan      Co-evaluation              AM-PAC PT 6 Clicks Mobility   Outcome Measure  Help needed turning from your back to your side while in a flat bed without using bedrails?: A Little Help needed moving from lying on your back to sitting on the side of a flat bed without using bedrails?: A Little Help needed moving to and from a bed to a chair (including a wheelchair)?: A Little Help needed standing up from a chair using your arms (e.g., wheelchair or bedside chair)?: A Little Help needed to walk in hospital room?: A Little Help needed climbing 3-5 steps with a railing? : A Lot 6 Click Score: 17    End of Session Equipment Utilized During Treatment: Gait belt Activity Tolerance: Patient tolerated treatment well Patient left: in chair;with chair alarm set;with call bell/phone within reach Nurse Communication: Mobility status;Other (comment) (HR 130s) PT Visit Diagnosis: Unsteadiness on feet (R26.81);Other abnormalities of gait and mobility (R26.89);Muscle weakness (generalized) (M62.81)     Time: 8766-8743 PT Time Calculation (min) (ACUTE ONLY): 23 min  Charges:    $Gait Training: 8-22 mins $Therapeutic Activity:  8-22 mins PT General Charges $$ ACUTE PT VISIT: 1 Visit                     Johnnie Gaynelle JACQUE Johnnie Kaelen Caughlin 11/14/2024, 1:34 PM

## 2024-11-14 NOTE — Progress Notes (Signed)
 Consultation Progress Note   Patient: Mary Chase FMW:[id number] DOB: 1935-04-09 DOA: 11/10/2024 DOS: the patient was seen and examined on 11/14/2024 Primary service: Camary Sosa, Landon BRAVO, MD  Brief hospital course:  Almarie BROCKS Faulconer is a 89 y.o. female with medical history significant of asthma COPD overlap syndrome, chronic diastolic heart failure who presented to the hospital with complaint of shortness of breath. According to the patient, over the last few months she has been having increasing dyspnea with ambulation and at rest. She reports that she is seen by cardiology and pulmonary medicine as well. She has her nebulizers and inhalers that she has been taking at home. She states that she was using her nebulizer however she believed that it was not working. She had an older model and decided to use that instead and place her nebulizer liquids. However she states that in spite of taking breathing treatments for 1 week her symptoms progressed. Therefore she presented to the hospital for further evaluation and treatment.    Assessment and Plan: Asthma/COPD overlap syndrome Moderate persistent asthma with acute exacerbation Will start the patient on duoneb, LABA/ICS -Continues to wheeze, no stable for dc yet. - Patient is slowly responding to p.o. steroids and bronchodilators.  Increased dose from 40 mg daily to 40 mg twice daily. Will continue to monitor the patient for clinical improvement - Prescription for nebulizer sent.  Delivered at bedside. -likely Discharge tomorrow   Chronic diastolic heart failure 2D echo completed today shows grade 1 diastolic dysfunction.  Clinically  euvolemic Echo was ordered on outpatient basis, study was completed in her inpatient stay.    Hypertension Norvasc  10 mg daily Will continue losartan  50 mg daily, spironolactone  12.5 mg daily   CAD Will continue norvasc , losartan  spironolactone  AND crestor  as well    CKD stage 3 a Stable Will continue  to monitor    Hypokalemia K is 3.4 Replace orally     DVT prophylaxis: lovenox   Code Status: full Family Communication:   Kirkendoll Jr.,Chologgis (Son) [phone] ([place])          TRH will continue to follow the patient.  Subjective: No acute overnight events,  Patient at bedside, She seems more short of breath this morning.  Tells me she just sat up to eat her breakfast.  She agrees that this is not her baseline and hopefully by tomorrow she should be ready for discharge.  Physical Exam: Vitals:   11/14/24 0828 11/14/24 0831 11/14/24 0850 11/14/24 0926  BP: (!) 140/85 (!) 140/85  131/78  Pulse: 98   (!) 105  Resp: 17   16  Temp: 98.3 F (36.8 C)   98.6 F (37 C)  TempSrc: Oral   Oral  SpO2: 97%  100% 97%  Weight:      Height:       General  No acute Distress Eyes: PERRL, lids and conjunctivae normal ENMT: Mucous membranes are moist.   Neck: normal, supple, no masses, no thyromegaly Respiratory:End expiratory wheezing bilaterally  no crackles.No accessory muscle use.  Cardiovascular: Regular rate and rhythm, no murmurs / rubs / gallops Abdomen: Soft, nontender nondistended. Musculoskeletal: no clubbing / cyanosis. No joint deformity upper and lower extremities.  Skin: no rashes, lesions, ulcers. No induration Neurologic: Facial asymmetry, moving extremity spontaneously, speech fluent. Psychiatric: Normal judgment and insight. Alert and oriented x 3. Normal mood.   Data Reviewed:    CBC    Component Value Date/Time   WBC 6.9 11/10/2024 1434   RBC 3.81 (  L) 11/10/2024 1434   HGB 11.8 (L) 11/10/2024 1434   HGB 10.6 (L) 06/22/2023 1454   HCT 36.9 11/10/2024 1434   HCT 32.1 (L) 06/22/2023 1454   PLT 222 11/10/2024 1434   PLT 304 06/22/2023 1454   MCV 96.9 11/10/2024 1434   MCV 96 06/22/2023 1454   MCH 31.0 11/10/2024 1434   MCHC 32.0 11/10/2024 1434   RDW 13.1 11/10/2024 1434   RDW 14.1 06/22/2023 1454   LYMPHSABS 1.5 11/10/2024 1434   LYMPHSABS 2.6  04/14/2022 0949   MONOABS 0.4 11/10/2024 1434   EOSABS 0.8 (H) 11/10/2024 1434   EOSABS 0.5 (H) 04/14/2022 0949   BASOSABS 0.1 11/10/2024 1434   BASOSABS 0.1 04/14/2022 0949   CMP     Component Value Date/Time   NA 142 11/12/2024 0905   NA 142 06/22/2023 1454   K 3.4 (L) 11/12/2024 0905   CL 102 11/12/2024 0905   CO2 26 11/12/2024 0905   GLUCOSE 98 11/12/2024 0905   BUN 20 11/12/2024 0905   BUN 9 06/22/2023 1454   CREATININE 1.36 (H) 11/12/2024 0905   CALCIUM  9.3 11/12/2024 0905   PROT 7.5 11/12/2024 0905   PROT 7.0 04/28/2022 1144   ALBUMIN  3.6 11/12/2024 0905   AST 19 11/12/2024 0905   ALT 9 11/12/2024 0905   ALKPHOS 82 11/12/2024 0905   BILITOT 0.5 11/12/2024 0905   EGFR 49 (L) 06/22/2023 1454   GFRNONAA 38 (L) 11/12/2024 0905     Author: Landon FORBES Baller, MD 11/14/2024 11:21 AM  For on call review [email or web address].

## 2024-11-14 NOTE — Progress Notes (Signed)
   11/14/24 0828  Vitals  Temp 98.3 F (36.8 C)  Temp Source Oral  BP (!) 140/85  BP Location Right Arm  BP Method Automatic  Patient Position (if appropriate) Lying  Pulse Rate 98  Resp 17  Level of Consciousness  Level of Consciousness Alert  MEWS COLOR  MEWS Score Color Green  Oxygen Therapy  SpO2 97 %  O2 Device Room Air  Pain Assessment  Pain Scale 0-10  Pain Score 0  MEWS Score  MEWS Temp 0  MEWS Systolic 0  MEWS Pulse 0  MEWS RR 0  MEWS LOC 0  MEWS Score 0

## 2024-11-14 NOTE — Progress Notes (Signed)
 Made provider aware of YELLOW mews status and the need for need for VTE orders.

## 2024-11-14 NOTE — TOC Progression Note (Signed)
 Transition of Care (TOC) - Progression Note   Confirmed with patient and family, NEB machine for home was delivered to bedside .    Patient Details  Name: Mary Chase MRN: [id number] Date of Birth: 10-28-1935  Transition of Care [facility]) CM/SW Contact  Iline Buchinger, Powell Jansky, RN Phone Number: 11/14/2024, 9:11 AM  Clinical Narrative:       Expected Discharge Plan: Home w Home Health Services Barriers to Discharge: Continued Medical Work up               Expected Discharge Plan and Services   Discharge Planning Services: CM Consult Post Acute Care Choice: Home Health, Durable Medical Equipment Living arrangements for the past 2 months: Single Family Home                 DME Arranged: Nebulizer machine DME Agency: [employer] Date DME Agency Contacted: 11/13/24 Time DME Agency Contacted: 1100 Representative spoke with at DME Agency: [place] [facility] Arranged: PT, OT, Refused HH           Social Drivers of Health (SDOH) Interventions SDOH Screenings   Food Insecurity: No Food Insecurity (11/11/2024)  Housing: Low Risk  (11/11/2024)  Transportation Needs: No Transportation Needs (11/11/2024)  Utilities: Not At Risk (11/11/2024)  Social Connections: Moderately Integrated (11/11/2024)  Tobacco Use: Low Risk  (11/10/2024)    Readmission Risk Interventions    04/25/2024    4:30 PM 07/06/2023   10:20 AM  Readmission Risk Prevention Plan  Post Dischage Appt Complete   Medication Screening Complete   Transportation Screening Complete Complete  PCP or Specialist Appt within 5-7 Days  Complete  Home Care Screening  Complete  Medication Review (RN CM)  Referral to Pharmacy

## 2024-11-14 NOTE — Care Management Important Message (Signed)
 Important Message  Patient Details  Name: Mary Chase MRN: [id number] Date of Birth: 1935/03/05   Important Message Given:  Yes - Medicare IM     Jennie Laneta Dragon 11/14/2024, 1:27 PM

## 2024-11-14 NOTE — Plan of Care (Signed)
  Problem: Education: Goal: Knowledge of General Education information will improve Description: Including pain rating scale, medication(s)/side effects and non-pharmacologic comfort measures Outcome: Progressing   Problem: Nutrition: Goal: Adequate nutrition will be maintained Outcome: Progressing   Problem: Coping: Goal: Level of anxiety will decrease Outcome: Progressing   Problem: Elimination: Goal: Will not experience complications related to bowel motility Outcome: Progressing   Problem: Pain Managment: Goal: General experience of comfort will improve and/or be controlled Outcome: Progressing   Problem: Safety: Goal: Ability to remain free from injury will improve Outcome: Progressing

## 2024-11-15 ENCOUNTER — Other Ambulatory Visit (HOSPITAL_COMMUNITY): Payer: Self-pay

## 2024-11-15 DIAGNOSIS — J4541 Moderate persistent asthma with (acute) exacerbation: Secondary | ICD-10-CM | POA: Diagnosis not present

## 2024-11-15 MED ORDER — FLUTICASONE PROPIONATE 50 MCG/ACT NA SUSP
2.0000 | Freq: Every day | NASAL | Status: DC
  Administered 2024-11-15: 2 via NASAL
  Filled 2024-11-15: qty 16

## 2024-11-15 MED ORDER — PREDNISONE 10 MG PO TABS
ORAL_TABLET | ORAL | 0 refills | Status: AC
  Filled 2024-11-15: qty 40, 16d supply, fill #0

## 2024-11-15 NOTE — Progress Notes (Signed)
 OT Cancellation Note  Patient Details Name: Mary Chase MRN: [id number] DOB: 1935/09/09   Cancelled Treatment:    Reason Eval/Treat Not Completed: Other (comment). OT attempted ADL session prior to d/c. Pt respectfully declining at this time. OT to follow-up as appropriate and schedule allows.  Maurilio CROME, OTR/LSABRA  [facility] Acute Rehabilitation  Office: [phone]   Maurilio PARAS [name] 11/15/2024, 2:18 PM

## 2024-11-15 NOTE — Progress Notes (Signed)
 Discharge instructions given to pt. Pt verbalized understanding of all teaching and had no further questions TOC meds picked up from pharmacy and given to pt at discharge.

## 2024-11-15 NOTE — Discharge Summary (Signed)
 Physician Discharge Summary  Mary Chase FMW:[id number] DOB: October 25, 1935 DOA: 11/10/2024  PCP: Elliot Charm, MD  Admit date: 11/10/2024 Discharge date: 11/15/2024  Admitted From: Home Disposition: Home  Recommendations for Outpatient Follow-up:  Follow up with PCP in 1-2 weeks Follow-up with pulmonology as discussed in 1 to 2 weeks  Home Health: None Equipment/Devices: None  Discharge Condition: Stable CODE STATUS: Full Diet recommendation:   Low-salt low-fat low-carb diet Brief/Interim Summary:   Mary Chase is a 89 y.o. female with medical history significant of asthma COPD overlap syndrome, chronic diastolic heart failure who presented to the hospital with complaint of shortness of breath. According to the patient, over the last few months she has been having increasing dyspnea with ambulation and at rest. She reports that she is seen by cardiology and pulmonary medicine as well. She has her nebulizers and inhalers that she has been taking at home. She states that she was using her nebulizer however she believed that it was not working. She had an older model and decided to use that instead and place her nebulizer liquids. However she states that in spite of taking breathing treatments for 1 week her symptoms progressed. Therefore she presented to the hospital for further evaluation and treatment.   Presented to the hospital with worsening shortness of breath and concern over asthma exacerbation - directed to come in from primary office. Patient noted to have notable wheeze after recent pesticide treatment around her house. With supportive care, steroids, nebs, and inhalers here she has improved drastically and now ambulating without shortness of breath or hypoxia. She is otherwise stable and agreeable for discharge home. Recommend close follow up with PCP and pulmonology as directed. Patient also having issues securing a nebulizer - one has been written for again here  in hopes that she will be able to apply it to her insurance as pricing has been an issue previously.    Discharge Diagnoses:  Principal Problem:   Moderate persistent asthma with acute exacerbation Active Problems:   HTN (hypertension)   CAD (coronary artery disease), native coronary artery   CKD stage 3a, GFR 45-59 ml/min (HCC)   Pulmonary hypertension (HCC)   Asthma exacerbation  Asthma/COPD overlap syndrome Moderate persistent asthma with acute exacerbation -Wheeze continues to recover and respiratory status is now improving back to baseline -Continue home inhalers/nebs and steroid taper as discussed with follow up outpatient as scheduled with pulm/PCP.   Chronic diastolic heart failure 2D echo shows grade 1 diastolic dysfunction.     Hypertension CAD Norvasc , losartan , spironolactone  Crestor    CKD stage 3 a At baseline   Hypokalemia Replaced, follow-up outpatient for repeat labs  Discharge Instructions  Discharge Instructions     Call MD for:  difficulty breathing, headache or visual disturbances   Complete by: As directed    Call MD for:  extreme fatigue   Complete by: As directed    Call MD for:  hives   Complete by: As directed    Call MD for:  persistant dizziness or light-headedness   Complete by: As directed    Call MD for:  persistant nausea and vomiting   Complete by: As directed    Call MD for:  severe uncontrolled pain   Complete by: As directed    Call MD for:  temperature >100.4   Complete by: As directed    Diet - low sodium heart healthy   Complete by: As directed    For home use only DME Nebulizer  machine   Complete by: As directed    drive power neb ultra compressor nebulizer   Patient needs a nebulizer to treat with the following condition: Asthma   Length of Need: Lifetime   Additional equipment included:  Administration kit Filter     Increase activity slowly   Complete by: As directed       Allergies as of 11/15/2024        Reactions   Penicillins Shortness Of Breath, Rash   Alendronate Sodium    hair loss, muscle aches, SOB,weight issues   Cephalexin Itching   Sulfa Antibiotics Itching, Other (See Comments)   Swelling in mouth   Sulfacetamide Sodium Itching   Other Itching   Some beans         Medication List     TAKE these medications    acetaminophen  500 MG tablet Commonly known as: TYLENOL  Take 500 mg by mouth in the morning and at bedtime.   albuterol  108 (90 Base) MCG/ACT inhaler Commonly known as: VENTOLIN  HFA INHALE 4 PUFFS INTO THE LUNGS EVERY 4 (FOUR) HOURS AS NEEDED FOR WHEEZING OR SHORTNESS OF BREATH.   albuterol  (2.5 MG/3ML) 0.083% nebulizer solution Commonly known as: PROVENTIL  Take 3 mLs (2.5 mg total) by nebulization every 4 (four) hours as needed for wheezing or shortness of breath.   amLODipine  10 MG tablet Commonly known as: NORVASC  Take 10 mg by mouth daily.   aspirin  EC 81 MG tablet Take 1 tablet (81 mg total) by mouth 2 (two) times daily.   B-12 5000 MCG Caps Take 5,000 mcg by mouth daily.   budesonide  0.5 MG/2ML nebulizer solution Commonly known as: PULMICORT  Take 2 mLs (0.5 mg total) by nebulization in the morning and at bedtime.   Eliquis  5 MG Tabs tablet Generic drug: apixaban  TAKE 1 TABLET BY MOUTH TWICE A DAY   fluticasone  50 MCG/ACT nasal spray Commonly known as: FLONASE  Place 1 spray into both nostrils daily as needed for allergies or rhinitis. What changed: when to take this   formoterol  20 MCG/2ML nebulizer solution Commonly known as: PERFOROMIST  Take 2 mLs (20 mcg total) by nebulization 2 (two) times daily.   furosemide  20 MG tablet Commonly known as: LASIX  TAKE 1 TABLET BY MOUTH EVERY DAY   Jardiance  10 MG Tabs tablet Generic drug: empagliflozin  Take 1 tablet (10 mg total) by mouth daily.   losartan  50 MG tablet Commonly known as: COZAAR  Take 1 tablet (50 mg total) by mouth daily.   montelukast  10 MG tablet Commonly known as:  SINGULAIR  Take 1 tablet (10 mg total) by mouth daily.   predniSONE  10 MG tablet Commonly known as: DELTASONE  Take 4 tablets (40 mg total) by mouth daily for 4 days, THEN 3 tablets (30 mg total) daily for 4 days, THEN 2 tablets (20 mg total) daily for 4 days, THEN 1 tablet (10 mg total) daily for 4 days. Start taking on: November 15, 2024   rosuvastatin  5 MG tablet Commonly known as: CRESTOR  TAKE 1 TABLET BY MOUTH EVERY OTHER DAY   spironolactone  25 MG tablet Commonly known as: ALDACTONE  Take 1/2 tablet (12.5 mg total) by mouth daily.   Vitamin D3 25 MCG (1000 UT) Caps Take 1,000 Units by mouth daily.   Yupelri  175 MCG/3ML nebulizer solution Generic drug: revefenacin  Take 3 mLs (175 mcg total) by nebulization daily. What changed: when to take this               Durable Medical Equipment  (From admission,  onward)           Start     Ordered   11/13/24 1101  For home use only DME Nebulizer machine  Once       Question Answer Comment  Patient needs a nebulizer to treat with the following condition Asthma   Length of Need Lifetime   Additional equipment included Administration kit   Additional equipment included Filter      11/13/24 1100   11/12/24 0000  For home use only DME Nebulizer machine       Comments: drive power neb ultra compressor nebulizer  Question Answer Comment  Patient needs a nebulizer to treat with the following condition Asthma   Length of Need Lifetime   Additional equipment included Administration kit   Additional equipment included Filter      11/12/24 1227            Follow-up Information     [facility] (DME) Follow up.   Specialty: DME Services Why: Company NEB machine was ordered through [employer] information: [address] [employer] [place]  [phone]               Allergies  Allergen Reactions   Penicillins Shortness Of Breath and Rash   Alendronate Sodium     hair loss,  muscle aches, SOB,weight issues   Cephalexin Itching   Sulfa Antibiotics Itching and Other (See Comments)    Swelling in mouth   Sulfacetamide Sodium Itching   Other Itching    Some beans     Consultations: None  Procedures/Studies: ECHOCARDIOGRAM COMPLETE Result Date: 11/13/2024    ECHOCARDIOGRAM REPORT   Patient Name:   Mary Chase Date of Exam: 11/13/2024 Medical Rec #:  [id number]        Height:       60.0 in Accession #:    [id number]       Weight:       160.1 lb Date of Birth:  July 12, 1935       BSA:          1.698 m Patient Age:    88 years         BP:           134/86 mmHg Patient Gender: F                HR:           88 bpm. Exam Location:  Inpatient Procedure: 2D Echo, Cardiac Doppler and Color Doppler (Both Spectral and Color            Flow Doppler were utilized during procedure). Indications:    Abnormal ECG R94.31  History:        Patient has prior history of Echocardiogram examinations, most                 recent 04/24/2024. CHF, CAD, Abnormal ECG, Pulmonary HTN, CKD and                 COPD, Arrythmias:Tachycardia; Risk Factors:Hypertension.  Sonographer:    Koleen Popper RDCS Referring Phys: PAULINE E DIBIA  Sonographer Comments: Image acquisition challenging due to respiratory motion and Image acquisition challenging due to COPD. IMPRESSIONS  1. Left ventricular ejection fraction, by estimation, is 70 to 75%. The left ventricle has hyperdynamic function. The left ventricle has no regional wall motion abnormalities. Left ventricular diastolic parameters are consistent with Grade I diastolic dysfunction (impaired relaxation).  2. Right ventricular systolic  function is normal. The right ventricular size is normal. There is moderately elevated pulmonary artery systolic pressure.  3. The mitral valve is normal in structure. Trivial mitral valve regurgitation. No evidence of mitral stenosis.  4. The aortic valve is calcified. Aortic valve regurgitation is not visualized. Mild aortic  valve stenosis. Aortic valve area, by VTI measures 1.57 cm. Aortic valve mean gradient measures 12.5 mmHg. Aortic valve Vmax measures 2.45 m/s.  5. The inferior vena cava is normal in size with greater than 50% respiratory variability, suggesting right atrial pressure of 3 mmHg. FINDINGS  Left Ventricle: Left ventricular ejection fraction, by estimation, is 70 to 75%. The left ventricle has hyperdynamic function. The left ventricle has no regional wall motion abnormalities. The left ventricular internal cavity size was normal in size. There is no left ventricular hypertrophy. Left ventricular diastolic parameters are consistent with Grade I diastolic dysfunction (impaired relaxation). Right Ventricle: The right ventricular size is normal. No increase in right ventricular wall thickness. Right ventricular systolic function is normal. There is moderately elevated pulmonary artery systolic pressure. The tricuspid regurgitant velocity is 2.95 m/s, and with an assumed right atrial pressure of 15 mmHg, the estimated right ventricular systolic pressure is 49.8 mmHg. Left Atrium: Left atrial size was normal in size. Right Atrium: Right atrial size was normal in size. Pericardium: There is no evidence of pericardial effusion. Mitral Valve: The mitral valve is normal in structure. Trivial mitral valve regurgitation. No evidence of mitral valve stenosis. Tricuspid Valve: The tricuspid valve is normal in structure. Tricuspid valve regurgitation is mild . No evidence of tricuspid stenosis. Aortic Valve: The aortic valve is calcified. Aortic valve regurgitation is not visualized. Mild aortic stenosis is present. Aortic valve mean gradient measures 12.5 mmHg. Aortic valve peak gradient measures 24.0 mmHg. Aortic valve area, by VTI measures 1.57 cm. Pulmonic Valve: The pulmonic valve was not well visualized. Pulmonic valve regurgitation is not visualized. No evidence of pulmonic stenosis. Aorta: The aortic root is normal in size  and structure. Venous: The inferior vena cava is normal in size with greater than 50% respiratory variability, suggesting right atrial pressure of 3 mmHg. IAS/Shunts: No atrial level shunt detected by color flow Doppler.  LEFT VENTRICLE PLAX 2D LVIDd:         4.20 cm   Diastology LVIDs:         2.20 cm   LV e' medial:    7.83 cm/s LV PW:         0.90 cm   LV E/e' medial:  10.4 LV IVS:        1.10 cm   LV e' lateral:   7.18 cm/s LVOT diam:     1.90 cm   LV E/e' lateral: 11.3 LV SV:         58 LV SV Index:   34 LVOT Area:     2.84 cm  RIGHT VENTRICLE             IVC RV S prime:     25.80 cm/s  IVC diam: 2.50 cm TAPSE (M-mode): 2.0 cm LEFT ATRIUM             Index LA diam:        3.30 cm 1.94 cm/m LA Vol (A2C):   32.1 ml 18.90 ml/m LA Vol (A4C):   30.4 ml 17.90 ml/m LA Biplane Vol: 31.8 ml 18.73 ml/m  AORTIC VALVE AV Area (Vmax):    1.55 cm AV Area (Vmean):   1.57  cm AV Area (VTI):     1.57 cm AV Vmax:           245.00 cm/s AV Vmean:          158.500 cm/s AV VTI:            0.368 m AV Peak Grad:      24.0 mmHg AV Mean Grad:      12.5 mmHg LVOT Vmax:         134.00 cm/s LVOT Vmean:        87.900 cm/s LVOT VTI:          0.204 m LVOT/AV VTI ratio: 0.56  AORTA Ao Root diam: 3.30 cm MITRAL VALVE                TRICUSPID VALVE MV Area (PHT): 4.41 cm     TR Peak grad:   34.8 mmHg MV Decel Time: 172 msec     TR Vmax:        295.00 cm/s MV E velocity: 81.20 cm/s MV A velocity: 118.00 cm/s  SHUNTS MV E/A ratio:  0.69         Systemic VTI:  0.20 m                             Systemic Diam: 1.90 cm Morene Brownie Electronically signed by Morene Brownie Signature Date/Time: 11/13/2024/1:17:55 PM    Final    DG Chest 2 View Result Date: 11/10/2024 CLINICAL DATA:  Asthma exacerbation EXAM: CHEST - 2 VIEW COMPARISON:  Chest x-ray 07/26/2024.  CT of the chest 08/23/2024. FINDINGS: The heart size and mediastinal contours are within normal limits. Both lungs are clear. The visualized skeletal structures are unremarkable.  Peripherally calcified nodular area is seen overlying the left chest, unchanged from prior. This corresponds to chest wall calcification on CT. IMPRESSION: No active cardiopulmonary disease. Electronically Signed   By: Greig Pique M.D.   On: 11/10/2024 15:18     Subjective: No acute issues or events overnight denies nausea vomit diarrhea constipation fever chills or chest pain   Discharge Exam: Vitals:   11/15/24 1012 11/15/24 1047  BP:    Pulse: 100   Resp:    Temp:    SpO2: 98% 98%   Vitals:   11/15/24 0852 11/15/24 1010 11/15/24 1012 11/15/24 1047  BP: (!) 142/85 123/67    Pulse:  (!) 101 100   Resp:      Temp:  98.2 F (36.8 C)    TempSrc:  Oral    SpO2:  99% 98% 98%  Weight:      Height:       General:  Pleasantly resting in bed, No acute distress. HEENT:  Normocephalic atraumatic.  Sclerae nonicteric, noninjected.  Extraocular movements intact bilaterally. Neck:  Without mass or deformity.  Trachea is midline. Lungs: Scant end expiratory wheeze but otherwise without rales or rhonchi. Heart:  Regular rate and rhythm.  Without murmurs, rubs, or gallops. Abdomen:  Soft, nontender, nondistended.  Without guarding or rebound. Extremities: Without cyanosis, clubbing, edema, or obvious deformity. Skin:  Warm and dry, no erythema.   The results of significant diagnostics from this hospitalization (including imaging, microbiology, ancillary and laboratory) are listed below for reference.     Microbiology: Recent Results (from the past 240 hours)  Resp panel by RT-PCR (RSV, Flu A&B, Covid) Anterior Nasal Swab     Status: None   Collection Time: 11/10/24  2:34 PM   Specimen: Anterior Nasal Swab  Result Value Ref Range Status   SARS Coronavirus 2 by RT PCR NEGATIVE NEGATIVE Final   Influenza A by PCR NEGATIVE NEGATIVE Final   Influenza B by PCR NEGATIVE NEGATIVE Final    Comment: (NOTE) The Xpert Xpress SARS-CoV-2/FLU/RSV plus assay is intended as an aid in the  diagnosis of influenza from Nasopharyngeal swab specimens and should not be used as a sole basis for treatment. Nasal washings and aspirates are unacceptable for Xpert Xpress SARS-CoV-2/FLU/RSV testing.  Fact Sheet for Patients: bloggercourse.com  Fact Sheet for Healthcare Providers: seriousbroker.it  This test is not yet approved or cleared by the [place]  FDA and has been authorized for detection and/or diagnosis of SARS-CoV-2 by FDA under an Emergency Use Authorization (EUA). This EUA will remain in effect (meaning this test can be used) for the duration of the COVID-19 declaration under Section 564(b)(1) of the Act, 21 U.S.C. section 360bbb-3(b)(1), unless the authorization is terminated or revoked.     Resp Syncytial Virus by PCR NEGATIVE NEGATIVE Final    Comment: (NOTE) Fact Sheet for Patients: bloggercourse.com  Fact Sheet for Healthcare Providers: seriousbroker.it  This test is not yet approved or cleared by the [place]  FDA and has been authorized for detection and/or diagnosis of SARS-CoV-2 by FDA under an Emergency Use Authorization (EUA). This EUA will remain in effect (meaning this test can be used) for the duration of the COVID-19 declaration under Section 564(b)(1) of the Act, 21 U.S.C. section 360bbb-3(b)(1), unless the authorization is terminated or revoked.  Performed at [facility], [address] [place], [place] [address]      Labs: BNP (last 3 results) Recent Labs    04/21/24 2240  BNP 34.0   Basic Metabolic Panel: Recent Labs  Lab 11/10/24 1434 11/12/24 0905 11/14/24 1204  NA 141 142 137  K 2.9* 3.4* 4.5  CL 101 102 100  CO2 30 26 26   GLUCOSE 105* 98 158*  BUN 8 20 28*  CREATININE 0.94 1.36* 1.30*  CALCIUM  9.4 9.3 9.9   Liver Function Tests: Recent Labs  Lab 11/12/24 0905 11/14/24 1204  AST 19 29  ALT 9 25   ALKPHOS 82 94  BILITOT 0.5 0.5  PROT 7.5 7.9  ALBUMIN  3.6 3.8   No results for input(s): LIPASE, AMYLASE in the last 168 hours. No results for input(s): AMMONIA in the last 168 hours. CBC: Recent Labs  Lab 11/10/24 1434  WBC 6.9  NEUTROABS 4.0  HGB 11.8*  HCT 36.9  MCV 96.9  PLT 222   Cardiac Enzymes: No results for input(s): CKTOTAL, CKMB, CKMBINDEX, TROPONINI in the last 168 hours. BNP: Invalid input(s): POCBNP CBG: No results for input(s): GLUCAP in the last 168 hours. D-Dimer No results for input(s): DDIMER in the last 72 hours. Hgb A1c No results for input(s): HGBA1C in the last 72 hours. Lipid Profile No results for input(s): CHOL, HDL, LDLCALC, TRIG, CHOLHDL, LDLDIRECT in the last 72 hours. Thyroid  function studies No results for input(s): TSH, T4TOTAL, T3FREE, THYROIDAB in the last 72 hours.  Invalid input(s): FREET3 Anemia work up No results for input(s): VITAMINB12, FOLATE, FERRITIN, TIBC, IRON, RETICCTPCT in the last 72 hours. Urinalysis    Component Value Date/Time   COLORURINE YELLOW 04/22/2024 0536   APPEARANCEUR CLEAR 04/22/2024 0536   LABSPEC >1.046 (H) 04/22/2024 0536   PHURINE 5.0 04/22/2024 0536   GLUCOSEU NEGATIVE 04/22/2024 0536   HGBUR SMALL (A) 04/22/2024 0536  BILIRUBINUR NEGATIVE 04/22/2024 0536   KETONESUR NEGATIVE 04/22/2024 0536   PROTEINUR 100 (A) 04/22/2024 0536   NITRITE NEGATIVE 04/22/2024 0536   LEUKOCYTESUR NEGATIVE 04/22/2024 0536   Sepsis Labs Recent Labs  Lab 11/10/24 1434  WBC 6.9   Microbiology Recent Results (from the past 240 hours)  Resp panel by RT-PCR (RSV, Flu A&B, Covid) Anterior Nasal Swab     Status: None   Collection Time: 11/10/24  2:34 PM   Specimen: Anterior Nasal Swab  Result Value Ref Range Status   SARS Coronavirus 2 by RT PCR NEGATIVE NEGATIVE Final   Influenza A by PCR NEGATIVE NEGATIVE Final   Influenza B by PCR NEGATIVE NEGATIVE Final     Comment: (NOTE) The Xpert Xpress SARS-CoV-2/FLU/RSV plus assay is intended as an aid in the diagnosis of influenza from Nasopharyngeal swab specimens and should not be used as a sole basis for treatment. Nasal washings and aspirates are unacceptable for Xpert Xpress SARS-CoV-2/FLU/RSV testing.  Fact Sheet for Patients: bloggercourse.com  Fact Sheet for Healthcare Providers: seriousbroker.it  This test is not yet approved or cleared by the [place]  FDA and has been authorized for detection and/or diagnosis of SARS-CoV-2 by FDA under an Emergency Use Authorization (EUA). This EUA will remain in effect (meaning this test can be used) for the duration of the COVID-19 declaration under Section 564(b)(1) of the Act, 21 U.S.C. section 360bbb-3(b)(1), unless the authorization is terminated or revoked.     Resp Syncytial Virus by PCR NEGATIVE NEGATIVE Final    Comment: (NOTE) Fact Sheet for Patients: bloggercourse.com  Fact Sheet for Healthcare Providers: seriousbroker.it  This test is not yet approved or cleared by the [place]  FDA and has been authorized for detection and/or diagnosis of SARS-CoV-2 by FDA under an Emergency Use Authorization (EUA). This EUA will remain in effect (meaning this test can be used) for the duration of the COVID-19 declaration under Section 564(b)(1) of the Act, 21 U.S.C. section 360bbb-3(b)(1), unless the authorization is terminated or revoked.  Performed at [facility], [address] [place], [place] [address]      Time coordinating discharge: Over 30 minutes  SIGNED:   Elsie JAYSON Montclair, DO [facility] 11/15/2024, 5:42 PM Pager   If 7PM-7AM, please contact night-coverage [email or web address]

## 2024-11-15 NOTE — Progress Notes (Signed)
   11/15/24 1047  Level of Consciousness  Level of Consciousness Alert  MEWS COLOR  MEWS Score Color Green  Oxygen Therapy  SpO2 98 %  O2 Device Room Air  Patient Activity (if Appropriate) Ambulating  MEWS Score  MEWS Temp 0  MEWS Systolic 0  MEWS Pulse 0  MEWS RR 0  MEWS LOC 0  MEWS Score 0

## 2024-11-15 NOTE — Progress Notes (Signed)
   11/15/24 0849  Vitals  BP (!) 142/85  BP Location Right Arm  BP Method Automatic  Patient Position (if appropriate) Sitting  Pulse Rate (!) 102  Pulse Rate Source Dinamap  Resp 17  Level of Consciousness  Level of Consciousness Alert  MEWS COLOR  MEWS Score Color Green  Oxygen Therapy  SpO2 98 %  O2 Device Room Air  Pain Assessment  Pain Scale 0-10  Pain Score 0  MEWS Score  MEWS Temp 0  MEWS Systolic 0  MEWS Pulse 1  MEWS RR 0  MEWS LOC 0  MEWS Score 1

## 2024-11-15 NOTE — Progress Notes (Signed)
 Assisted pt to and from [facility]. Pt placed back in bed. Family at bedside. All needs met at this time

## 2024-11-15 NOTE — Plan of Care (Signed)
  Problem: Education: Goal: Knowledge of General Education information will improve Description: Including pain rating scale, medication(s)/side effects and non-pharmacologic comfort measures Outcome: Progressing   Problem: Health Behavior/Discharge Planning: Goal: Ability to manage health-related needs will improve Outcome: Progressing   Problem: Clinical Measurements: Goal: Ability to maintain clinical measurements within normal limits will improve Outcome: Progressing   Problem: Respiratory: Goal: Ability to maintain adequate ventilation will improve Outcome: Progressing

## 2024-12-07 ENCOUNTER — Encounter: Payer: Self-pay | Admitting: Allergy & Immunology

## 2024-12-07 ENCOUNTER — Encounter (INDEPENDENT_AMBULATORY_CARE_PROVIDER_SITE_OTHER): Payer: Self-pay

## 2024-12-07 ENCOUNTER — Other Ambulatory Visit: Payer: Self-pay

## 2024-12-07 ENCOUNTER — Ambulatory Visit: Admitting: Allergy & Immunology

## 2024-12-07 VITALS — BP 114/60 | HR 76 | Temp 98.1°F | Resp 20

## 2024-12-07 DIAGNOSIS — J4489 Other specified chronic obstructive pulmonary disease: Secondary | ICD-10-CM | POA: Diagnosis not present

## 2024-12-07 DIAGNOSIS — J330 Polyp of nasal cavity: Secondary | ICD-10-CM

## 2024-12-07 DIAGNOSIS — J302 Other seasonal allergic rhinitis: Secondary | ICD-10-CM | POA: Diagnosis not present

## 2024-12-07 DIAGNOSIS — R7689 Other specified abnormal immunological findings in serum: Secondary | ICD-10-CM

## 2024-12-07 DIAGNOSIS — J3089 Other allergic rhinitis: Secondary | ICD-10-CM

## 2024-12-07 MED ORDER — YUPELRI 175 MCG/3ML IN SOLN: 175.0000 ug | Freq: Every day | RESPIRATORY_TRACT | 1 refills | Status: AC

## 2024-12-07 NOTE — Progress Notes (Unsigned)
 FOLLOW UP  Date of Service/Encounter:  12/07/2024   Assessment:   Moderate persistent asthma, uncomplicated - with three rounds of prednisone  in less than 6 months   Strongly consider Dupixent (have discussed with patient and she is worried about the side effects), seems open to [employer] (provided handout today)   Chronic rhinitis (grasses, weeds, ragweed, trees, molds, dust mites, roach, cat, dog) - s/p years of allergen immunotherapy   Elevated IgE - with normal SPEP and UPEP    Polyp of left nasal cavity - much better with the regular use of fluticasone  nasal spray  Plan/Recommendations:   Patient Instructions  1. Moderate persistent asthma, uncomplicated - Lung testing looked amazing today. - We are going to give you more  - Daily controller medication(s): Pulmicort  (budesonide ) 0.5 mg + Perforomist  (formoterol ) MIXED TOGETHER TWICE DAILY VIA NEBULIZER + Yupelri  one vial once daily VIA NEBULIZER + Singulair  (montelukast ) 10mg  daily   TWICE DAILY   TWICE DAILY   ONCE DAILY  - Prior to physical activity: albuterol  2 puffs 10-15 minutes before physical activity. - Rescue medications: albuterol  4 puffs every 4-6 hours as needed and albuterol  nebulizer one vial every 4-6 hours as needed - Asthma control goals:  * Full participation in all desired activities (may need albuterol  before activity) * Albuterol  use two time or less a week on average (not counting use with activity) * Cough interfering with sleep two time or less a month * Oral steroids no more than once a year * No hospitalizations  2. 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  - Consider the Dupixent.  - Continue with Zyrtec (cetirizine) 10mg  daily as needed for runny nose/sneezing.  - Continue with Mucinex  as needed.  3. Return in about 3 months (around 03/07/2025).     Please inform us  of any Emergency Department visits, hospitalizations, or  changes in symptoms. Call us  before going to the ED for breathing or allergy symptoms since we might be able to fit you in for a sick visit. Feel free to contact us  anytime with any questions, problems, or concerns.  It was a pleasure to see you again today!   Websites that have reliable patient information: 1. American Academy of Asthma, Allergy, and Immunology: www.aaaai.org 2. Food Allergy Research and Education (FARE): foodallergy.org 3. Mothers of Asthmatics: http://www.asthmacommunitynetwork.org 4. American College of Allergy, Asthma, and Immunology: www.acaai.org     C    Subjective:   Mary Chase is a 89 y.o. female presenting today for follow up of  Chief Complaint  Patient presents with   Follow-up    Pt wants to find out if she's getting dupixant or not... states she heard it was and then it wasn't approved. Nose gets really dry and notices bleeding a few different times... has nasal polyp medication   Medication Management    Pt states she needs a better way of sending in Rx because its off and states her pharmacy os working with her on it...    Mary Chase has a history of the following: Patient Active Problem List   Diagnosis Date Noted   Asthma exacerbation 11/11/2024   Acute asthma exacerbation 04/22/2024   Obesity, class 1 04/22/2024   Seasonal and perennial allergic rhinitis 01/06/2024   Polyp of nasal cavity 01/06/2024   Moderate persistent asthma with acute exacerbation 07/05/2023   CKD stage 3a, GFR 45-59 ml/min (HCC) 07/05/2023   CAD (coronary artery  disease), native coronary artery 06/30/2023   Edema of both ankles 06/22/2023   Ascending aorta dilatation 06/04/2023   Pulmonary hypertension (HCC) 06/04/2023   Mitral regurgitation 06/04/2023   Tricuspid regurgitation 06/04/2023   Acute on chronic diastolic CHF (congestive heart failure) (HCC) 06/04/2023   Asthma, chronic, unspecified asthma severity, with acute exacerbation 05/03/2023    HTN (hypertension) 05/03/2023   Acute respiratory failure with hypoxia (HCC) 05/03/2023   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   Osteoarthritis of right hip 09/11/2020   H/O allergic rhinitis 08/31/2015    History obtained from: chart review and {Persons; PED relatives w/patient:19415::patient}.  Discussed the use of AI scribe software for clinical note transcription with the patient and/or guardian, who gave verbal consent to proceed.  Jet is a 89 y.o. female presenting for {Blank single:19197::a food challenge,a drug challenge,skin testing,a sick visit,an evaluation of ***,a follow up visit}.  She was last seen in January 2025.  At that time, her asthma is under better control.  We recommended continuing with Pulmicort  Perforomist  twice a day.  We added on Yupelri  1 vial daily.  For her rhinitis, we continue with Flonase  2 sprays per nostril twice daily.  We talked about Dupixent and continue with Zyrtec.  Since last visit,z  Asthma/Respiratory Symptom History: ***  Allergic Rhinitis Symptom History: ***  Food Allergy Symptom History: ***  Skin Symptom History: ***  GERD Symptom History: ***  Infection Symptom History: ***  Otherwise, there have been no changes to her past medical history, surgical history, family history, or social history.    Review of systems otherwise negative other than that mentioned in the HPI.    Objective:   Blood pressure 114/60, pulse 76, temperature 98.1 F (36.7 C), temperature source Temporal, resp. rate 20, SpO2 100%. There is no height or weight on file to calculate BMI.    Physical Exam   Diagnostic studies:    Spirometry: results normal (FEV1: 0.77/60%, FVC: 1.45/86%, FEV1/FVC: 53%).    Spirometry consistent with mild obstructive disease. {Blank single:19197::Albuterol /Atrovent  nebulizer,Xopenex /Atrovent  nebulizer,Albuterol  nebulizer,Albuterol  four puffs via MDI,Xopenex  four puffs via MDI} treatment  given in clinic with {Blank single:19197::significant improvement in FEV1 per ATS criteria,significant improvement in FVC per ATS criteria,significant improvement in FEV1 and FVC per ATS criteria,improvement in FEV1, but not significant per ATS criteria,improvement in FVC, but not significant per ATS criteria,improvement in FEV1 and FVC, but not significant per ATS criteria,no improvement}.  Allergy Studies: {Blank single:19197::none,deferred due to recent antihistamine use,deferred due to insurance stipulations that require a separate visit for testing,labs sent instead, }    {Blank single:19197::Allergy testing results were read and interpreted by myself, documented by clinical staff., }      Marty Shaggy, MD  [facility] 

## 2024-12-07 NOTE — Patient Instructions (Addendum)
 1. Moderate persistent asthma, uncomplicated - Lung testing looked amazing today. - We are going to give you more  - Daily controller medication(s): Pulmicort  (budesonide ) 0.5 mg + Perforomist  (formoterol ) MIXED TOGETHER TWICE DAILY VIA NEBULIZER + Yupelri  one vial once daily VIA NEBULIZER + Singulair  (montelukast ) 10mg  daily   TWICE DAILY   TWICE DAILY   ONCE DAILY  - Prior to physical activity: albuterol  2 puffs 10-15 minutes before physical activity. - Rescue medications: albuterol  4 puffs every 4-6 hours as needed and albuterol  nebulizer one vial every 4-6 hours as needed - Asthma control goals:  * Full participation in all desired activities (may need albuterol  before activity) * Albuterol  use two time or less a week on average (not counting use with activity) * Cough interfering with sleep two time or less a month * Oral steroids no more than once a year * No hospitalizations  2. 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  - Consider the Dupixent.  - Continue with Zyrtec (cetirizine) 10mg  daily as needed for runny nose/sneezing.  - Continue with Mucinex  as needed.  3. Return in about 3 months (around 03/07/2025).     Please inform us  of any Emergency Department visits, hospitalizations, or changes in symptoms. Call us  before going to the ED for breathing or allergy symptoms since we might be able to fit you in for a sick visit. Feel free to contact us  anytime with any questions, problems, or concerns.  It was a pleasure to see you again today!   Websites that have reliable patient information: 1. American Academy of Asthma, Allergy, and Immunology: www.aaaai.org 2. Food Allergy Research and Education (FARE): foodallergy.org 3. Mothers of Asthmatics: http://www.asthmacommunitynetwork.org 4. American College of Allergy, Asthma, and Immunology: www.acaai.org     C

## 2024-12-08 ENCOUNTER — Other Ambulatory Visit (HOSPITAL_COMMUNITY): Payer: Self-pay

## 2024-12-10 ENCOUNTER — Encounter: Payer: Self-pay | Admitting: Allergy & Immunology

## 2024-12-10 MED ORDER — FORMOTEROL FUMARATE 20 MCG/2ML IN NEBU: 20.0000 ug | INHALATION_SOLUTION | Freq: Two times a day (BID) | RESPIRATORY_TRACT | 1 refills | Status: AC

## 2024-12-10 MED ORDER — BUDESONIDE 0.5 MG/2ML IN SUSP: 0.5000 mg | Freq: Two times a day (BID) | RESPIRATORY_TRACT | 1 refills | Status: AC

## 2024-12-20 ENCOUNTER — Encounter (INDEPENDENT_AMBULATORY_CARE_PROVIDER_SITE_OTHER): Payer: Self-pay

## 2025-01-01 ENCOUNTER — Other Ambulatory Visit (HOSPITAL_COMMUNITY): Payer: Self-pay

## 2025-01-03 ENCOUNTER — Telehealth: Payer: Self-pay | Admitting: Allergy & Immunology

## 2025-01-03 NOTE — Telephone Encounter (Signed)
 Luccia is scheduled with Dr. Karis for 2/4 at 2:30 pm.

## 2025-01-08 ENCOUNTER — Encounter: Payer: Self-pay | Admitting: Cardiology

## 2025-01-09 ENCOUNTER — Telehealth: Payer: Self-pay

## 2025-01-09 DIAGNOSIS — I272 Pulmonary hypertension, unspecified: Secondary | ICD-10-CM

## 2025-01-09 NOTE — Telephone Encounter (Signed)
 Pt's MyChart message: Vinie been calling several times to ask about my appt and my medicine Eliguis. Each time I get a disconnect. Please contact me at [phone] this is my cell phone. This medicine cost $249.30, and I cant get it at this time. Please contact me I have lots of other ????   Pt had an echo completed in Nov 2025. Pt would like Dr. Shlomo to review.  Pt can not pay $249.30, would like to know if there is another medication she can take since it's so expensive.  Will get message to patient assistance staff to see if there is any thing that can be done for the pt.  Pt is upset since the phone service kept cutting out on her.  Pt would like to know if she needs to redo the echo.

## 2025-01-11 NOTE — Telephone Encounter (Signed)
 Unfortunately she wont be able to get a grant She has a $440 deductible to meet and then her cost would be abount 34 dollar per month. She can sign up for the payment plan with medicare if she cannot afford the $249 upfront.

## 2025-01-11 NOTE — Telephone Encounter (Signed)
 Left detailed message for patient (OK per DPR) with response from Dr. Shlomo.  Echo for 1 year ordered. Will also forward message to medication assistance team to assist with cost of Eliquis .

## 2025-01-23 ENCOUNTER — Telehealth (INDEPENDENT_AMBULATORY_CARE_PROVIDER_SITE_OTHER): Payer: Self-pay

## 2025-01-23 ENCOUNTER — Telehealth (INDEPENDENT_AMBULATORY_CARE_PROVIDER_SITE_OTHER): Payer: Self-pay | Admitting: Otolaryngology

## 2025-01-23 ENCOUNTER — Encounter (INDEPENDENT_AMBULATORY_CARE_PROVIDER_SITE_OTHER): Payer: Self-pay

## 2025-01-23 NOTE — Telephone Encounter (Signed)
 Patient called to reschedule appointment tomorrow with Dr. Karis because she still cannot get out of her driveway due to snow.  Patient spoke with Cyntiha C., MA and appointment on 02/19/2025 @ 3:20pm was offered.  When I went to reschedule appointment, appointment was already taken.  I called patient and left a voicemail message on her mobile # and home # requesting a call back to reschedule.

## 2025-01-24 ENCOUNTER — Institutional Professional Consult (permissible substitution) (INDEPENDENT_AMBULATORY_CARE_PROVIDER_SITE_OTHER): Admitting: Otolaryngology

## 2025-01-24 ENCOUNTER — Other Ambulatory Visit: Payer: Self-pay | Admitting: Allergy & Immunology

## 2025-01-24 ENCOUNTER — Telehealth: Payer: Self-pay | Admitting: Allergy & Immunology

## 2025-01-24 NOTE — Telephone Encounter (Signed)
 Mary Chase called and stated that her pharmacy is reacjing out to Dr. Iva, but she wanted to let us  know as well that she is in need of a Prednasone pack

## 2025-01-25 NOTE — Telephone Encounter (Signed)
 She stated that she is short of breath and taking her breathing treatments and it is giving some relief

## 2025-01-26 MED ORDER — PREDNISONE 10 MG PO TABS: ORAL_TABLET | ORAL | 0 refills | Status: AC

## 2025-01-26 NOTE — Telephone Encounter (Signed)
 I sent in prednisone . Can we call and make an appointment for Mon or Tues with the NP so we can make sure she is making a full recovery?

## 2025-01-26 NOTE — Addendum Note (Signed)
 Addended by: IVA MARTY SALTNESS on: 01/26/2025 09:28 AM   Modules accepted: Orders

## 2025-02-20 ENCOUNTER — Ambulatory Visit: Admitting: Pulmonary Disease

## 2025-02-22 ENCOUNTER — Institutional Professional Consult (permissible substitution) (INDEPENDENT_AMBULATORY_CARE_PROVIDER_SITE_OTHER): Admitting: Otolaryngology

## 2025-03-08 ENCOUNTER — Ambulatory Visit: Admitting: Allergy & Immunology
